# Patient Record
Sex: Female | Born: 1935 | Race: White | Hispanic: No | Smoking: Former smoker
Health system: Southern US, Community
[De-identification: ages and names within clinical notes are randomized; demographics above are authoritative.]

## PROBLEM LIST (undated history)

## (undated) DIAGNOSIS — I872 Venous insufficiency (chronic) (peripheral): Secondary | ICD-10-CM

## (undated) DIAGNOSIS — I10 Essential (primary) hypertension: Secondary | ICD-10-CM

## (undated) DIAGNOSIS — J209 Acute bronchitis, unspecified: Secondary | ICD-10-CM

## (undated) DIAGNOSIS — Z9861 Coronary angioplasty status: Secondary | ICD-10-CM

## (undated) DIAGNOSIS — M1990 Unspecified osteoarthritis, unspecified site: Secondary | ICD-10-CM

## (undated) DIAGNOSIS — I272 Pulmonary hypertension, unspecified: Secondary | ICD-10-CM

## (undated) DIAGNOSIS — E119 Type 2 diabetes mellitus without complications: Secondary | ICD-10-CM

## (undated) DIAGNOSIS — K8020 Calculus of gallbladder without cholecystitis without obstruction: Secondary | ICD-10-CM

## (undated) DIAGNOSIS — N289 Disorder of kidney and ureter, unspecified: Secondary | ICD-10-CM

## (undated) DIAGNOSIS — R55 Syncope and collapse: Secondary | ICD-10-CM

## (undated) DIAGNOSIS — I495 Sick sinus syndrome: Secondary | ICD-10-CM

## (undated) DIAGNOSIS — E669 Obesity, unspecified: Secondary | ICD-10-CM

## (undated) DIAGNOSIS — A0472 Enterocolitis due to Clostridium difficile, not specified as recurrent: Secondary | ICD-10-CM

## (undated) DIAGNOSIS — J449 Chronic obstructive pulmonary disease, unspecified: Secondary | ICD-10-CM

## (undated) DIAGNOSIS — I5030 Unspecified diastolic (congestive) heart failure: Secondary | ICD-10-CM

## (undated) DIAGNOSIS — E785 Hyperlipidemia, unspecified: Secondary | ICD-10-CM

## (undated) DIAGNOSIS — I2510 Atherosclerotic heart disease of native coronary artery without angina pectoris: Secondary | ICD-10-CM

## (undated) DIAGNOSIS — D649 Anemia, unspecified: Secondary | ICD-10-CM

## (undated) DIAGNOSIS — Z950 Presence of cardiac pacemaker: Secondary | ICD-10-CM

## (undated) DIAGNOSIS — I469 Cardiac arrest, cause unspecified: Secondary | ICD-10-CM

## (undated) DIAGNOSIS — Z8719 Personal history of other diseases of the digestive system: Secondary | ICD-10-CM

## (undated) HISTORY — DX: Obesity, unspecified: E66.9

## (undated) HISTORY — DX: Presence of cardiac pacemaker: Z95.0

## (undated) HISTORY — DX: Type 2 diabetes mellitus without complications: E11.9

## (undated) HISTORY — DX: Pulmonary hypertension, unspecified: I27.20

## (undated) HISTORY — DX: Unspecified osteoarthritis, unspecified site: M19.90

## (undated) HISTORY — DX: Chronic obstructive pulmonary disease, unspecified: J44.9

## (undated) HISTORY — DX: Cardiac arrest, cause unspecified: I46.9

## (undated) HISTORY — DX: Calculus of gallbladder without cholecystitis without obstruction: K80.20

## (undated) HISTORY — DX: Coronary angioplasty status: Z98.61

## (undated) HISTORY — DX: Hyperlipidemia, unspecified: E78.5

## (undated) HISTORY — DX: Personal history of other diseases of the digestive system: Z87.19

## (undated) HISTORY — DX: Venous insufficiency (chronic) (peripheral): I87.2

## (undated) HISTORY — DX: Sick sinus syndrome: I49.5

## (undated) HISTORY — DX: Essential (primary) hypertension: I10

## (undated) HISTORY — DX: Anemia, unspecified: D64.9

## (undated) HISTORY — DX: Syncope and collapse: R55

## (undated) HISTORY — DX: Unspecified diastolic (congestive) heart failure: I50.30

## (undated) HISTORY — DX: Disorder of kidney and ureter, unspecified: N28.9

## (undated) HISTORY — PX: VESICOVAGINAL FISTULA CLOSURE W/ TAH: SUR271

## (undated) HISTORY — PX: APPENDECTOMY: SHX54

## (undated) HISTORY — DX: Atherosclerotic heart disease of native coronary artery without angina pectoris: I25.10

## (undated) HISTORY — DX: Enterocolitis due to Clostridium difficile, not specified as recurrent: A04.72

## (undated) HISTORY — DX: Acute bronchitis, unspecified: J20.9

---

## 1998-08-15 ENCOUNTER — Encounter: Admission: RE | Admit: 1998-08-15 | Discharge: 1998-11-13 | Payer: Self-pay | Admitting: Family Medicine

## 1999-05-27 ENCOUNTER — Other Ambulatory Visit: Admission: RE | Admit: 1999-05-27 | Discharge: 1999-05-27 | Payer: Self-pay | Admitting: Family Medicine

## 1999-10-28 ENCOUNTER — Encounter: Payer: Self-pay | Admitting: Emergency Medicine

## 1999-10-29 ENCOUNTER — Inpatient Hospital Stay (HOSPITAL_COMMUNITY): Admission: EM | Admit: 1999-10-29 | Discharge: 1999-11-03 | Payer: Self-pay | Admitting: Emergency Medicine

## 1999-10-30 ENCOUNTER — Encounter: Payer: Self-pay | Admitting: Emergency Medicine

## 1999-11-02 ENCOUNTER — Encounter: Payer: Self-pay | Admitting: Emergency Medicine

## 2000-09-13 DIAGNOSIS — Z950 Presence of cardiac pacemaker: Secondary | ICD-10-CM

## 2000-09-13 DIAGNOSIS — I469 Cardiac arrest, cause unspecified: Secondary | ICD-10-CM

## 2000-09-13 HISTORY — DX: Presence of cardiac pacemaker: Z95.0

## 2000-09-13 HISTORY — PX: PACEMAKER PLACEMENT: SHX43

## 2000-09-13 HISTORY — PX: PTCA: SHX146

## 2000-09-13 HISTORY — DX: Cardiac arrest, cause unspecified: I46.9

## 2000-11-30 ENCOUNTER — Other Ambulatory Visit: Admission: RE | Admit: 2000-11-30 | Discharge: 2000-11-30 | Payer: Self-pay | Admitting: Family Medicine

## 2001-06-28 ENCOUNTER — Ambulatory Visit (HOSPITAL_COMMUNITY): Admission: RE | Admit: 2001-06-28 | Discharge: 2001-06-28 | Payer: Self-pay | Admitting: Family Medicine

## 2001-07-26 ENCOUNTER — Encounter: Payer: Self-pay | Admitting: Family Medicine

## 2001-07-26 ENCOUNTER — Ambulatory Visit (HOSPITAL_COMMUNITY): Admission: RE | Admit: 2001-07-26 | Discharge: 2001-07-26 | Payer: Self-pay | Admitting: Family Medicine

## 2001-08-17 ENCOUNTER — Inpatient Hospital Stay (HOSPITAL_COMMUNITY): Admission: AD | Admit: 2001-08-17 | Discharge: 2001-08-19 | Payer: Self-pay | Admitting: Family Medicine

## 2001-08-17 ENCOUNTER — Encounter: Payer: Self-pay | Admitting: Family Medicine

## 2001-08-17 ENCOUNTER — Emergency Department (HOSPITAL_COMMUNITY): Admission: EM | Admit: 2001-08-17 | Discharge: 2001-08-17 | Payer: Self-pay | Admitting: Emergency Medicine

## 2001-08-19 ENCOUNTER — Encounter: Payer: Self-pay | Admitting: Family Medicine

## 2001-08-31 ENCOUNTER — Encounter: Payer: Self-pay | Admitting: Emergency Medicine

## 2001-08-31 ENCOUNTER — Inpatient Hospital Stay (HOSPITAL_COMMUNITY): Admission: EM | Admit: 2001-08-31 | Discharge: 2001-09-07 | Payer: Self-pay | Admitting: Emergency Medicine

## 2001-09-03 ENCOUNTER — Encounter: Payer: Self-pay | Admitting: Cardiology

## 2001-09-10 ENCOUNTER — Encounter: Payer: Self-pay | Admitting: Emergency Medicine

## 2001-09-11 ENCOUNTER — Inpatient Hospital Stay (HOSPITAL_COMMUNITY): Admission: EM | Admit: 2001-09-11 | Discharge: 2001-09-13 | Payer: Self-pay | Admitting: Emergency Medicine

## 2001-09-13 ENCOUNTER — Encounter: Payer: Self-pay | Admitting: Cardiology

## 2001-11-27 ENCOUNTER — Ambulatory Visit (HOSPITAL_COMMUNITY): Admission: RE | Admit: 2001-11-27 | Discharge: 2001-11-27 | Payer: Self-pay | Admitting: Internal Medicine

## 2001-11-27 ENCOUNTER — Encounter: Payer: Self-pay | Admitting: Internal Medicine

## 2001-12-27 ENCOUNTER — Emergency Department (HOSPITAL_COMMUNITY): Admission: EM | Admit: 2001-12-27 | Discharge: 2001-12-27 | Payer: Self-pay

## 2004-06-29 ENCOUNTER — Ambulatory Visit: Payer: Self-pay | Admitting: Internal Medicine

## 2004-07-06 ENCOUNTER — Ambulatory Visit: Payer: Self-pay | Admitting: Internal Medicine

## 2004-07-27 ENCOUNTER — Ambulatory Visit: Payer: Self-pay | Admitting: Internal Medicine

## 2004-08-11 ENCOUNTER — Ambulatory Visit: Payer: Self-pay | Admitting: Internal Medicine

## 2004-08-18 ENCOUNTER — Ambulatory Visit: Payer: Self-pay | Admitting: Critical Care Medicine

## 2004-10-03 ENCOUNTER — Ambulatory Visit: Payer: Self-pay | Admitting: Internal Medicine

## 2004-10-08 ENCOUNTER — Ambulatory Visit: Payer: Self-pay | Admitting: Endocrinology

## 2004-10-13 ENCOUNTER — Emergency Department (HOSPITAL_COMMUNITY): Admission: EM | Admit: 2004-10-13 | Discharge: 2004-10-13 | Payer: Self-pay | Admitting: Family Medicine

## 2004-10-15 ENCOUNTER — Ambulatory Visit: Payer: Self-pay | Admitting: Endocrinology

## 2004-10-21 ENCOUNTER — Ambulatory Visit: Payer: Self-pay | Admitting: Internal Medicine

## 2004-11-12 ENCOUNTER — Ambulatory Visit: Payer: Self-pay | Admitting: Endocrinology

## 2004-12-09 ENCOUNTER — Ambulatory Visit: Payer: Self-pay | Admitting: Internal Medicine

## 2004-12-15 ENCOUNTER — Ambulatory Visit: Payer: Self-pay | Admitting: Cardiology

## 2004-12-15 ENCOUNTER — Inpatient Hospital Stay (HOSPITAL_BASED_OUTPATIENT_CLINIC_OR_DEPARTMENT_OTHER): Admission: RE | Admit: 2004-12-15 | Discharge: 2004-12-15 | Payer: Self-pay | Admitting: *Deleted

## 2004-12-30 ENCOUNTER — Ambulatory Visit: Payer: Self-pay | Admitting: Endocrinology

## 2005-01-06 ENCOUNTER — Ambulatory Visit: Payer: Self-pay | Admitting: Endocrinology

## 2005-01-08 ENCOUNTER — Ambulatory Visit: Payer: Self-pay | Admitting: Cardiology

## 2005-01-20 ENCOUNTER — Emergency Department (HOSPITAL_COMMUNITY): Admission: EM | Admit: 2005-01-20 | Discharge: 2005-01-20 | Payer: Self-pay | Admitting: Emergency Medicine

## 2005-01-27 ENCOUNTER — Ambulatory Visit: Payer: Self-pay | Admitting: Critical Care Medicine

## 2005-01-27 ENCOUNTER — Ambulatory Visit: Payer: Self-pay | Admitting: Internal Medicine

## 2005-02-17 ENCOUNTER — Ambulatory Visit: Payer: Self-pay | Admitting: Internal Medicine

## 2005-02-23 ENCOUNTER — Ambulatory Visit: Payer: Self-pay | Admitting: Endocrinology

## 2005-02-23 ENCOUNTER — Ambulatory Visit (HOSPITAL_COMMUNITY): Admission: RE | Admit: 2005-02-23 | Discharge: 2005-02-23 | Payer: Self-pay | Admitting: Endocrinology

## 2005-03-10 ENCOUNTER — Ambulatory Visit: Payer: Self-pay | Admitting: Endocrinology

## 2005-03-26 ENCOUNTER — Ambulatory Visit: Payer: Self-pay | Admitting: Internal Medicine

## 2005-03-29 ENCOUNTER — Ambulatory Visit: Payer: Self-pay | Admitting: Endocrinology

## 2005-03-30 ENCOUNTER — Ambulatory Visit: Payer: Self-pay | Admitting: Critical Care Medicine

## 2005-03-31 ENCOUNTER — Ambulatory Visit: Payer: Self-pay | Admitting: Endocrinology

## 2005-04-06 ENCOUNTER — Ambulatory Visit: Payer: Self-pay | Admitting: Endocrinology

## 2005-04-14 ENCOUNTER — Ambulatory Visit: Payer: Self-pay | Admitting: Endocrinology

## 2005-04-20 ENCOUNTER — Ambulatory Visit: Payer: Self-pay | Admitting: Internal Medicine

## 2005-04-22 ENCOUNTER — Ambulatory Visit: Payer: Self-pay | Admitting: Internal Medicine

## 2005-05-06 ENCOUNTER — Ambulatory Visit: Payer: Self-pay | Admitting: Endocrinology

## 2005-05-12 ENCOUNTER — Ambulatory Visit: Payer: Self-pay | Admitting: Internal Medicine

## 2005-05-28 ENCOUNTER — Ambulatory Visit: Payer: Self-pay | Admitting: Internal Medicine

## 2005-05-31 ENCOUNTER — Ambulatory Visit: Payer: Self-pay | Admitting: Endocrinology

## 2005-06-03 ENCOUNTER — Ambulatory Visit: Payer: Self-pay | Admitting: Endocrinology

## 2005-06-16 ENCOUNTER — Ambulatory Visit: Payer: Self-pay | Admitting: Endocrinology

## 2005-06-21 ENCOUNTER — Ambulatory Visit: Payer: Self-pay | Admitting: Internal Medicine

## 2005-06-23 ENCOUNTER — Ambulatory Visit: Payer: Self-pay | Admitting: Internal Medicine

## 2005-06-25 ENCOUNTER — Ambulatory Visit: Payer: Self-pay | Admitting: Critical Care Medicine

## 2005-06-28 ENCOUNTER — Ambulatory Visit: Payer: Self-pay | Admitting: Internal Medicine

## 2005-06-29 ENCOUNTER — Inpatient Hospital Stay (HOSPITAL_COMMUNITY): Admission: EM | Admit: 2005-06-29 | Discharge: 2005-07-06 | Payer: Self-pay | Admitting: Emergency Medicine

## 2005-06-29 ENCOUNTER — Ambulatory Visit: Payer: Self-pay | Admitting: Internal Medicine

## 2005-06-29 ENCOUNTER — Ambulatory Visit: Payer: Self-pay | Admitting: Physical Medicine & Rehabilitation

## 2005-07-12 ENCOUNTER — Ambulatory Visit: Payer: Self-pay | Admitting: Endocrinology

## 2005-07-16 ENCOUNTER — Encounter: Admission: RE | Admit: 2005-07-16 | Discharge: 2005-07-16 | Payer: Self-pay | Admitting: Endocrinology

## 2005-07-20 ENCOUNTER — Ambulatory Visit: Payer: Self-pay | Admitting: Internal Medicine

## 2005-07-21 ENCOUNTER — Ambulatory Visit: Payer: Self-pay | Admitting: Cardiology

## 2005-07-30 ENCOUNTER — Ambulatory Visit: Payer: Self-pay | Admitting: Endocrinology

## 2005-08-04 ENCOUNTER — Emergency Department (HOSPITAL_COMMUNITY): Admission: EM | Admit: 2005-08-04 | Discharge: 2005-08-04 | Payer: Self-pay | Admitting: Emergency Medicine

## 2005-08-09 ENCOUNTER — Ambulatory Visit: Payer: Self-pay | Admitting: Family Medicine

## 2005-08-12 ENCOUNTER — Emergency Department (HOSPITAL_COMMUNITY): Admission: EM | Admit: 2005-08-12 | Discharge: 2005-08-12 | Payer: Self-pay | Admitting: Emergency Medicine

## 2005-08-14 ENCOUNTER — Emergency Department (HOSPITAL_COMMUNITY): Admission: EM | Admit: 2005-08-14 | Discharge: 2005-08-14 | Payer: Self-pay | Admitting: Emergency Medicine

## 2005-08-18 ENCOUNTER — Emergency Department (HOSPITAL_COMMUNITY): Admission: EM | Admit: 2005-08-18 | Discharge: 2005-08-18 | Payer: Self-pay | Admitting: Emergency Medicine

## 2005-08-20 ENCOUNTER — Ambulatory Visit: Payer: Self-pay | Admitting: Endocrinology

## 2005-09-15 ENCOUNTER — Ambulatory Visit: Payer: Self-pay | Admitting: Cardiology

## 2005-09-22 ENCOUNTER — Ambulatory Visit: Payer: Self-pay | Admitting: Endocrinology

## 2005-10-13 ENCOUNTER — Ambulatory Visit: Payer: Self-pay | Admitting: Cardiology

## 2005-11-01 ENCOUNTER — Ambulatory Visit: Payer: Self-pay | Admitting: Internal Medicine

## 2005-11-12 ENCOUNTER — Ambulatory Visit: Payer: Self-pay | Admitting: Endocrinology

## 2005-11-16 ENCOUNTER — Ambulatory Visit: Payer: Self-pay | Admitting: Cardiology

## 2005-12-13 ENCOUNTER — Ambulatory Visit: Payer: Self-pay | Admitting: Internal Medicine

## 2006-01-05 ENCOUNTER — Ambulatory Visit: Payer: Self-pay | Admitting: Internal Medicine

## 2006-01-18 ENCOUNTER — Ambulatory Visit: Payer: Self-pay | Admitting: Endocrinology

## 2006-01-20 ENCOUNTER — Ambulatory Visit: Payer: Self-pay | Admitting: Cardiology

## 2006-01-20 ENCOUNTER — Ambulatory Visit: Payer: Self-pay | Admitting: Internal Medicine

## 2006-01-31 ENCOUNTER — Ambulatory Visit: Payer: Self-pay | Admitting: Internal Medicine

## 2006-02-28 ENCOUNTER — Inpatient Hospital Stay (HOSPITAL_COMMUNITY): Admission: EM | Admit: 2006-02-28 | Discharge: 2006-03-04 | Payer: Self-pay | Admitting: Emergency Medicine

## 2006-02-28 ENCOUNTER — Ambulatory Visit: Payer: Self-pay | Admitting: Endocrinology

## 2006-03-07 ENCOUNTER — Ambulatory Visit: Payer: Self-pay | Admitting: Endocrinology

## 2006-03-21 ENCOUNTER — Ambulatory Visit: Payer: Self-pay | Admitting: Cardiology

## 2006-04-04 ENCOUNTER — Ambulatory Visit: Payer: Self-pay | Admitting: Internal Medicine

## 2006-04-06 ENCOUNTER — Ambulatory Visit: Payer: Self-pay | Admitting: Critical Care Medicine

## 2006-04-14 ENCOUNTER — Ambulatory Visit: Payer: Self-pay | Admitting: Internal Medicine

## 2006-04-14 ENCOUNTER — Inpatient Hospital Stay (HOSPITAL_COMMUNITY): Admission: EM | Admit: 2006-04-14 | Discharge: 2006-04-25 | Payer: Self-pay | Admitting: Emergency Medicine

## 2006-04-26 ENCOUNTER — Ambulatory Visit: Payer: Self-pay | Admitting: Endocrinology

## 2006-04-27 ENCOUNTER — Ambulatory Visit: Payer: Self-pay | Admitting: Endocrinology

## 2006-05-04 ENCOUNTER — Ambulatory Visit: Payer: Self-pay | Admitting: Endocrinology

## 2006-05-10 ENCOUNTER — Ambulatory Visit: Payer: Self-pay | Admitting: Critical Care Medicine

## 2006-05-17 ENCOUNTER — Ambulatory Visit: Payer: Self-pay | Admitting: Internal Medicine

## 2006-05-18 ENCOUNTER — Ambulatory Visit: Payer: Self-pay | Admitting: Endocrinology

## 2006-05-25 ENCOUNTER — Ambulatory Visit: Payer: Self-pay | Admitting: Internal Medicine

## 2006-06-13 ENCOUNTER — Ambulatory Visit: Payer: Self-pay | Admitting: Critical Care Medicine

## 2006-06-21 ENCOUNTER — Ambulatory Visit: Payer: Self-pay | Admitting: Internal Medicine

## 2006-06-24 ENCOUNTER — Ambulatory Visit: Payer: Self-pay | Admitting: Endocrinology

## 2006-07-07 ENCOUNTER — Ambulatory Visit: Payer: Self-pay | Admitting: Endocrinology

## 2006-07-11 ENCOUNTER — Inpatient Hospital Stay (HOSPITAL_COMMUNITY): Admission: EM | Admit: 2006-07-11 | Discharge: 2006-07-15 | Payer: Self-pay | Admitting: Emergency Medicine

## 2006-07-11 ENCOUNTER — Ambulatory Visit: Payer: Self-pay | Admitting: Internal Medicine

## 2006-07-18 ENCOUNTER — Ambulatory Visit: Payer: Self-pay | Admitting: Critical Care Medicine

## 2006-07-21 ENCOUNTER — Ambulatory Visit: Payer: Self-pay | Admitting: Endocrinology

## 2006-08-08 ENCOUNTER — Ambulatory Visit: Payer: Self-pay | Admitting: Endocrinology

## 2006-08-16 ENCOUNTER — Ambulatory Visit: Payer: Self-pay | Admitting: Endocrinology

## 2006-08-18 ENCOUNTER — Ambulatory Visit: Payer: Self-pay | Admitting: Endocrinology

## 2006-08-22 ENCOUNTER — Ambulatory Visit: Payer: Self-pay | Admitting: Cardiology

## 2006-08-24 ENCOUNTER — Ambulatory Visit: Payer: Self-pay | Admitting: Critical Care Medicine

## 2006-09-02 ENCOUNTER — Ambulatory Visit: Payer: Self-pay | Admitting: Internal Medicine

## 2006-09-05 ENCOUNTER — Ambulatory Visit: Payer: Self-pay | Admitting: Pulmonary Disease

## 2006-09-05 ENCOUNTER — Inpatient Hospital Stay (HOSPITAL_COMMUNITY): Admission: EM | Admit: 2006-09-05 | Discharge: 2006-09-09 | Payer: Self-pay | Admitting: Emergency Medicine

## 2006-09-07 ENCOUNTER — Ambulatory Visit: Payer: Self-pay | Admitting: Endocrinology

## 2006-09-19 ENCOUNTER — Ambulatory Visit: Payer: Self-pay | Admitting: Endocrinology

## 2006-09-21 ENCOUNTER — Ambulatory Visit: Payer: Self-pay | Admitting: Critical Care Medicine

## 2006-10-11 ENCOUNTER — Emergency Department (HOSPITAL_COMMUNITY): Admission: EM | Admit: 2006-10-11 | Discharge: 2006-10-11 | Payer: Self-pay | Admitting: Emergency Medicine

## 2006-10-11 ENCOUNTER — Ambulatory Visit: Payer: Self-pay | Admitting: Cardiology

## 2006-10-17 ENCOUNTER — Ambulatory Visit: Payer: Self-pay | Admitting: Internal Medicine

## 2006-10-17 ENCOUNTER — Ambulatory Visit: Payer: Self-pay | Admitting: Endocrinology

## 2006-10-18 ENCOUNTER — Inpatient Hospital Stay (HOSPITAL_COMMUNITY): Admission: EM | Admit: 2006-10-18 | Discharge: 2006-10-24 | Payer: Self-pay | Admitting: Emergency Medicine

## 2006-10-18 ENCOUNTER — Ambulatory Visit: Payer: Self-pay | Admitting: Endocrinology

## 2006-10-19 ENCOUNTER — Ambulatory Visit: Payer: Self-pay | Admitting: Internal Medicine

## 2006-11-08 ENCOUNTER — Ambulatory Visit: Payer: Self-pay | Admitting: Endocrinology

## 2006-11-14 ENCOUNTER — Ambulatory Visit: Payer: Self-pay | Admitting: Critical Care Medicine

## 2006-11-21 ENCOUNTER — Ambulatory Visit: Payer: Self-pay | Admitting: Cardiology

## 2006-11-21 LAB — CONVERTED CEMR LAB
Chloride: 104 meq/L (ref 96–112)
Eosinophils Absolute: 0 10*3/uL (ref 0.0–0.6)
Eosinophils Relative: 0.2 % (ref 0.0–5.0)
GFR calc non Af Amer: 27 mL/min
Glucose, Bld: 53 mg/dL — ABNORMAL LOW (ref 70–99)
HCT: 32.7 % — ABNORMAL LOW (ref 36.0–46.0)
Hemoglobin: 11.7 g/dL — ABNORMAL LOW (ref 12.0–15.0)
Lymphocytes Relative: 16.4 % (ref 12.0–46.0)
MCV: 81.8 fL (ref 78.0–100.0)
Monocytes Absolute: 0.1 10*3/uL — ABNORMAL LOW (ref 0.2–0.7)
Neutrophils Relative %: 82.7 % — ABNORMAL HIGH (ref 43.0–77.0)
Potassium: 3.9 meq/L (ref 3.5–5.1)
RBC: 4.36 M/uL (ref 3.87–5.11)
Sodium: 145 meq/L (ref 135–145)
WBC: 16.7 10*3/uL — ABNORMAL HIGH (ref 4.5–10.5)

## 2006-11-22 ENCOUNTER — Ambulatory Visit: Payer: Self-pay | Admitting: Endocrinology

## 2006-11-29 ENCOUNTER — Ambulatory Visit: Payer: Self-pay | Admitting: Critical Care Medicine

## 2006-12-06 ENCOUNTER — Ambulatory Visit: Payer: Self-pay | Admitting: Internal Medicine

## 2006-12-06 ENCOUNTER — Inpatient Hospital Stay (HOSPITAL_COMMUNITY): Admission: EM | Admit: 2006-12-06 | Discharge: 2006-12-08 | Payer: Self-pay | Admitting: Endocrinology

## 2006-12-06 ENCOUNTER — Encounter: Payer: Self-pay | Admitting: Vascular Surgery

## 2006-12-06 ENCOUNTER — Ambulatory Visit: Payer: Self-pay | Admitting: Vascular Surgery

## 2006-12-06 ENCOUNTER — Ambulatory Visit: Payer: Self-pay | Admitting: Endocrinology

## 2006-12-13 ENCOUNTER — Ambulatory Visit: Payer: Self-pay | Admitting: *Deleted

## 2006-12-13 LAB — CONVERTED CEMR LAB
CO2: 31 meq/L (ref 19–32)
Calcium: 9.2 mg/dL (ref 8.4–10.5)
Chloride: 101 meq/L (ref 96–112)
GFR calc Af Amer: 38 mL/min
Glucose, Bld: 42 mg/dL — CL (ref 70–99)

## 2006-12-14 ENCOUNTER — Ambulatory Visit: Payer: Self-pay | Admitting: Endocrinology

## 2006-12-26 ENCOUNTER — Ambulatory Visit: Payer: Self-pay | Admitting: Endocrinology

## 2006-12-27 ENCOUNTER — Ambulatory Visit: Payer: Self-pay | Admitting: Endocrinology

## 2007-01-02 ENCOUNTER — Ambulatory Visit: Payer: Self-pay | Admitting: Critical Care Medicine

## 2007-01-09 ENCOUNTER — Ambulatory Visit: Payer: Self-pay | Admitting: Cardiology

## 2007-01-09 LAB — CONVERTED CEMR LAB
Basophils Absolute: 0.1 10*3/uL (ref 0.0–0.1)
Eosinophils Absolute: 0.1 10*3/uL (ref 0.0–0.6)
GFR calc non Af Amer: 31 mL/min
HCT: 32.5 % — ABNORMAL LOW (ref 36.0–46.0)
Hemoglobin: 10.7 g/dL — ABNORMAL LOW (ref 12.0–15.0)
MCHC: 33 g/dL (ref 30.0–36.0)
MCV: 78.4 fL (ref 78.0–100.0)
Monocytes Absolute: 1.2 10*3/uL — ABNORMAL HIGH (ref 0.2–0.7)
Neutrophils Relative %: 75.2 % (ref 43.0–77.0)
Potassium: 3.5 meq/L (ref 3.5–5.1)
Pro B Natriuretic peptide (BNP): 81 pg/mL (ref 0.0–100.0)
Sodium: 144 meq/L (ref 135–145)

## 2007-01-10 ENCOUNTER — Ambulatory Visit: Payer: Self-pay | Admitting: Pulmonary Disease

## 2007-01-10 ENCOUNTER — Ambulatory Visit: Payer: Self-pay | Admitting: Critical Care Medicine

## 2007-01-30 ENCOUNTER — Encounter: Payer: Self-pay | Admitting: Endocrinology

## 2007-01-30 DIAGNOSIS — J449 Chronic obstructive pulmonary disease, unspecified: Secondary | ICD-10-CM | POA: Insufficient documentation

## 2007-01-30 DIAGNOSIS — I8310 Varicose veins of unspecified lower extremity with inflammation: Secondary | ICD-10-CM | POA: Insufficient documentation

## 2007-01-30 DIAGNOSIS — E785 Hyperlipidemia, unspecified: Secondary | ICD-10-CM

## 2007-01-30 DIAGNOSIS — E1129 Type 2 diabetes mellitus with other diabetic kidney complication: Secondary | ICD-10-CM

## 2007-01-30 DIAGNOSIS — M109 Gout, unspecified: Secondary | ICD-10-CM

## 2007-01-30 DIAGNOSIS — J309 Allergic rhinitis, unspecified: Secondary | ICD-10-CM | POA: Insufficient documentation

## 2007-02-07 ENCOUNTER — Ambulatory Visit: Payer: Self-pay | Admitting: Internal Medicine

## 2007-02-07 LAB — CONVERTED CEMR LAB
Albumin: 2.8 g/dL — ABNORMAL LOW (ref 3.5–5.2)
GFR calc non Af Amer: 23 mL/min
Potassium: 4.3 meq/L (ref 3.5–5.1)
Sodium: 143 meq/L (ref 135–145)
Total Bilirubin: 0.6 mg/dL (ref 0.3–1.2)

## 2007-02-13 LAB — CONVERTED CEMR LAB: Pap Smear: NORMAL

## 2007-02-17 ENCOUNTER — Ambulatory Visit: Payer: Self-pay | Admitting: Pulmonary Disease

## 2007-02-17 ENCOUNTER — Inpatient Hospital Stay (HOSPITAL_COMMUNITY): Admission: EM | Admit: 2007-02-17 | Discharge: 2007-03-08 | Payer: Self-pay | Admitting: Emergency Medicine

## 2007-02-17 ENCOUNTER — Ambulatory Visit: Payer: Self-pay | Admitting: Cardiovascular Disease

## 2007-02-17 ENCOUNTER — Ambulatory Visit: Payer: Self-pay | Admitting: Internal Medicine

## 2007-02-21 ENCOUNTER — Encounter (INDEPENDENT_AMBULATORY_CARE_PROVIDER_SITE_OTHER): Payer: Self-pay | Admitting: Internal Medicine

## 2007-02-24 DIAGNOSIS — Z8719 Personal history of other diseases of the digestive system: Secondary | ICD-10-CM

## 2007-03-08 ENCOUNTER — Ambulatory Visit: Payer: Self-pay | Admitting: Endocrinology

## 2007-04-05 ENCOUNTER — Ambulatory Visit: Payer: Self-pay | Admitting: Critical Care Medicine

## 2007-04-05 LAB — CONVERTED CEMR LAB
Crystals: NEGATIVE
Ketones, ur: NEGATIVE mg/dL
Specific Gravity, Urine: >=1.03 (ref 1.000–1.03)
Urine Glucose: NEGATIVE mg/dL
Urobilinogen, UA: 0.2 (ref 0.0–1.0)
pH: 5.5 (ref 5.0–8.0)

## 2007-04-06 ENCOUNTER — Encounter: Payer: Self-pay | Admitting: Critical Care Medicine

## 2007-04-19 ENCOUNTER — Ambulatory Visit: Payer: Self-pay | Admitting: Internal Medicine

## 2007-04-24 ENCOUNTER — Ambulatory Visit (HOSPITAL_COMMUNITY): Admission: RE | Admit: 2007-04-24 | Discharge: 2007-04-24 | Payer: Self-pay | Admitting: Internal Medicine

## 2007-04-28 ENCOUNTER — Ambulatory Visit: Payer: Self-pay | Admitting: Cardiology

## 2007-05-05 ENCOUNTER — Ambulatory Visit: Payer: Self-pay | Admitting: Critical Care Medicine

## 2007-05-24 ENCOUNTER — Ambulatory Visit: Payer: Self-pay | Admitting: Internal Medicine

## 2007-06-23 ENCOUNTER — Ambulatory Visit: Payer: Self-pay | Admitting: Cardiology

## 2007-06-27 ENCOUNTER — Ambulatory Visit: Payer: Self-pay | Admitting: Endocrinology

## 2007-06-27 LAB — CONVERTED CEMR LAB: Hgb A1c MFr Bld: 5.8 % (ref 4.6–6.0)

## 2007-07-05 ENCOUNTER — Encounter: Payer: Self-pay | Admitting: Endocrinology

## 2007-07-12 ENCOUNTER — Telehealth (INDEPENDENT_AMBULATORY_CARE_PROVIDER_SITE_OTHER): Payer: Self-pay | Admitting: *Deleted

## 2007-07-13 ENCOUNTER — Ambulatory Visit: Payer: Self-pay | Admitting: Endocrinology

## 2007-08-08 ENCOUNTER — Inpatient Hospital Stay (HOSPITAL_COMMUNITY): Admission: EM | Admit: 2007-08-08 | Discharge: 2007-08-09 | Payer: Self-pay | Admitting: Emergency Medicine

## 2007-08-09 ENCOUNTER — Encounter: Payer: Self-pay | Admitting: Endocrinology

## 2007-08-23 ENCOUNTER — Encounter: Payer: Self-pay | Admitting: *Deleted

## 2007-08-23 DIAGNOSIS — D631 Anemia in chronic kidney disease: Secondary | ICD-10-CM | POA: Insufficient documentation

## 2007-08-23 DIAGNOSIS — I10 Essential (primary) hypertension: Secondary | ICD-10-CM | POA: Insufficient documentation

## 2007-08-23 DIAGNOSIS — E669 Obesity, unspecified: Secondary | ICD-10-CM

## 2007-08-23 DIAGNOSIS — N189 Chronic kidney disease, unspecified: Secondary | ICD-10-CM

## 2007-08-23 DIAGNOSIS — M1990 Unspecified osteoarthritis, unspecified site: Secondary | ICD-10-CM | POA: Insufficient documentation

## 2007-09-25 ENCOUNTER — Ambulatory Visit: Payer: Self-pay | Admitting: Endocrinology

## 2007-10-10 ENCOUNTER — Encounter: Payer: Self-pay | Admitting: Pulmonary Disease

## 2007-10-10 ENCOUNTER — Ambulatory Visit: Payer: Self-pay | Admitting: Cardiology

## 2007-10-12 ENCOUNTER — Ambulatory Visit: Payer: Self-pay | Admitting: Internal Medicine

## 2007-10-12 DIAGNOSIS — I2510 Atherosclerotic heart disease of native coronary artery without angina pectoris: Secondary | ICD-10-CM

## 2007-10-12 DIAGNOSIS — N184 Chronic kidney disease, stage 4 (severe): Secondary | ICD-10-CM

## 2007-10-12 DIAGNOSIS — J441 Chronic obstructive pulmonary disease with (acute) exacerbation: Secondary | ICD-10-CM

## 2008-01-01 ENCOUNTER — Ambulatory Visit: Payer: Self-pay | Admitting: Endocrinology

## 2008-01-01 ENCOUNTER — Encounter (INDEPENDENT_AMBULATORY_CARE_PROVIDER_SITE_OTHER): Payer: Self-pay | Admitting: *Deleted

## 2008-01-04 ENCOUNTER — Ambulatory Visit: Payer: Self-pay | Admitting: Cardiology

## 2008-01-28 ENCOUNTER — Emergency Department (HOSPITAL_COMMUNITY): Admission: EM | Admit: 2008-01-28 | Discharge: 2008-01-28 | Payer: Self-pay | Admitting: Emergency Medicine

## 2008-01-31 ENCOUNTER — Ambulatory Visit: Payer: Self-pay | Admitting: Critical Care Medicine

## 2008-01-31 LAB — CONVERTED CEMR LAB
Basophils Absolute: 0.1 10*3/uL (ref 0.0–0.1)
Crystals: NEGATIVE
Eosinophils Absolute: 0.5 10*3/uL (ref 0.0–0.7)
Leukocytes, UA: NEGATIVE
MCHC: 33.4 g/dL (ref 30.0–36.0)
MCV: 81 fL (ref 78.0–100.0)
Monocytes Absolute: 1 10*3/uL (ref 0.1–1.0)
Neutrophils Relative %: 64.2 % (ref 43.0–77.0)
Platelets: 253 10*3/uL (ref 150–400)
RDW: 14.1 % (ref 11.5–14.6)
Specific Gravity, Urine: 1.01 (ref 1.000–1.03)
Urobilinogen, UA: 0.2 (ref 0.0–1.0)
WBC: 11.6 10*3/uL — ABNORMAL HIGH (ref 4.5–10.5)

## 2008-02-01 ENCOUNTER — Ambulatory Visit: Payer: Self-pay | Admitting: Critical Care Medicine

## 2008-02-01 ENCOUNTER — Inpatient Hospital Stay (HOSPITAL_COMMUNITY): Admission: EM | Admit: 2008-02-01 | Discharge: 2008-02-04 | Payer: Self-pay | Admitting: Emergency Medicine

## 2008-02-01 ENCOUNTER — Ambulatory Visit: Payer: Self-pay | Admitting: Internal Medicine

## 2008-02-01 ENCOUNTER — Encounter (INDEPENDENT_AMBULATORY_CARE_PROVIDER_SITE_OTHER): Payer: Self-pay | Admitting: Internal Medicine

## 2008-03-01 ENCOUNTER — Ambulatory Visit: Payer: Self-pay | Admitting: Critical Care Medicine

## 2008-04-02 ENCOUNTER — Ambulatory Visit: Payer: Self-pay | Admitting: Endocrinology

## 2008-04-02 LAB — CONVERTED CEMR LAB: Hgb A1c MFr Bld: 6.5 % — ABNORMAL HIGH (ref 4.6–6.0)

## 2008-05-31 ENCOUNTER — Ambulatory Visit: Payer: Self-pay | Admitting: Critical Care Medicine

## 2008-06-11 ENCOUNTER — Ambulatory Visit: Payer: Self-pay | Admitting: Internal Medicine

## 2008-07-01 ENCOUNTER — Telehealth (INDEPENDENT_AMBULATORY_CARE_PROVIDER_SITE_OTHER): Payer: Self-pay | Admitting: *Deleted

## 2008-07-02 ENCOUNTER — Ambulatory Visit: Payer: Self-pay | Admitting: Critical Care Medicine

## 2008-07-22 ENCOUNTER — Ambulatory Visit: Payer: Self-pay | Admitting: Critical Care Medicine

## 2008-11-20 ENCOUNTER — Ambulatory Visit: Payer: Self-pay | Admitting: Critical Care Medicine

## 2008-12-20 ENCOUNTER — Encounter (INDEPENDENT_AMBULATORY_CARE_PROVIDER_SITE_OTHER): Payer: Self-pay

## 2008-12-31 ENCOUNTER — Encounter: Payer: Self-pay | Admitting: Cardiology

## 2008-12-31 ENCOUNTER — Encounter: Payer: Self-pay | Admitting: Internal Medicine

## 2008-12-31 ENCOUNTER — Ambulatory Visit: Payer: Self-pay | Admitting: Internal Medicine

## 2008-12-31 DIAGNOSIS — R55 Syncope and collapse: Secondary | ICD-10-CM

## 2008-12-31 DIAGNOSIS — I495 Sick sinus syndrome: Secondary | ICD-10-CM | POA: Insufficient documentation

## 2008-12-31 DIAGNOSIS — I5033 Acute on chronic diastolic (congestive) heart failure: Secondary | ICD-10-CM

## 2009-01-01 ENCOUNTER — Telehealth (INDEPENDENT_AMBULATORY_CARE_PROVIDER_SITE_OTHER): Payer: Self-pay | Admitting: *Deleted

## 2009-01-01 ENCOUNTER — Ambulatory Visit: Payer: Self-pay | Admitting: Critical Care Medicine

## 2009-01-16 ENCOUNTER — Encounter: Payer: Self-pay | Admitting: Cardiology

## 2009-02-17 ENCOUNTER — Ambulatory Visit: Payer: Self-pay | Admitting: Cardiology

## 2009-02-24 ENCOUNTER — Telehealth: Payer: Self-pay | Admitting: Cardiology

## 2009-03-10 ENCOUNTER — Ambulatory Visit: Payer: Self-pay | Admitting: Critical Care Medicine

## 2009-03-10 DIAGNOSIS — J209 Acute bronchitis, unspecified: Secondary | ICD-10-CM | POA: Insufficient documentation

## 2009-03-18 ENCOUNTER — Ambulatory Visit: Payer: Self-pay | Admitting: Endocrinology

## 2009-03-28 ENCOUNTER — Telehealth (INDEPENDENT_AMBULATORY_CARE_PROVIDER_SITE_OTHER): Payer: Self-pay | Admitting: *Deleted

## 2009-04-01 ENCOUNTER — Ambulatory Visit: Payer: Self-pay | Admitting: Internal Medicine

## 2009-04-01 ENCOUNTER — Telehealth (INDEPENDENT_AMBULATORY_CARE_PROVIDER_SITE_OTHER): Payer: Self-pay | Admitting: *Deleted

## 2009-04-01 LAB — CONVERTED CEMR LAB
BUN: 27 mg/dL — ABNORMAL HIGH (ref 6–23)
Basophils Absolute: 0.1 10*3/uL (ref 0.0–0.1)
Basophils Relative: 0.5 % (ref 0.0–3.0)
CO2: 33 meq/L — ABNORMAL HIGH (ref 19–32)
Calcium: 9.2 mg/dL (ref 8.4–10.5)
Chloride: 104 meq/L (ref 96–112)
Creatinine, Ser: 1.7 mg/dL — ABNORMAL HIGH (ref 0.4–1.2)
Eosinophils Absolute: 0.4 10*3/uL (ref 0.0–0.7)
Lymphocytes Relative: 19.1 % (ref 12.0–46.0)
MCHC: 33.8 g/dL (ref 30.0–36.0)
MCV: 81.6 fL (ref 78.0–100.0)
Magnesium: 2.2 mg/dL (ref 1.5–2.5)
Monocytes Absolute: 0.7 10*3/uL (ref 0.1–1.0)
Neutrophils Relative %: 70.1 % (ref 43.0–77.0)
Platelets: 190 10*3/uL (ref 150.0–400.0)
Pro B Natriuretic peptide (BNP): 43 pg/mL (ref 0.0–100.0)
RBC: 4.15 M/uL (ref 3.87–5.11)
TSH: 1.67 microintl units/mL (ref 0.35–5.50)
WBC: 10.9 10*3/uL — ABNORMAL HIGH (ref 4.5–10.5)

## 2009-04-21 ENCOUNTER — Ambulatory Visit: Payer: Self-pay | Admitting: Endocrinology

## 2009-04-21 LAB — CONVERTED CEMR LAB: PTH: 146.5 pg/mL — ABNORMAL HIGH (ref 14.0–72.0)

## 2009-04-28 ENCOUNTER — Ambulatory Visit: Payer: Self-pay | Admitting: Critical Care Medicine

## 2009-05-16 ENCOUNTER — Ambulatory Visit: Payer: Self-pay | Admitting: Endocrinology

## 2009-05-16 LAB — CONVERTED CEMR LAB
BUN: 51 mg/dL — ABNORMAL HIGH (ref 6–23)
Bilirubin Urine: NEGATIVE
Blood in Urine, dipstick: NEGATIVE
CO2: 34 meq/L — ABNORMAL HIGH (ref 19–32)
Chloride: 100 meq/L (ref 96–112)
GFR calc non Af Amer: 33.02 mL/min (ref >60)
Glucose, Bld: 135 mg/dL — ABNORMAL HIGH (ref 70–99)
Glucose, Urine, Semiquant: NEGATIVE
Ketones, urine, test strip: NEGATIVE
Potassium: 3.9 meq/L (ref 3.5–5.1)
Sodium: 142 meq/L (ref 135–145)
Specific Gravity, Urine: <1.005
Urobilinogen, UA: 0.2

## 2009-05-28 ENCOUNTER — Ambulatory Visit: Payer: Self-pay | Admitting: Critical Care Medicine

## 2009-05-29 ENCOUNTER — Telehealth (INDEPENDENT_AMBULATORY_CARE_PROVIDER_SITE_OTHER): Payer: Self-pay | Admitting: *Deleted

## 2009-06-19 ENCOUNTER — Ambulatory Visit: Payer: Self-pay | Admitting: Endocrinology

## 2009-06-26 ENCOUNTER — Encounter: Payer: Self-pay | Admitting: Internal Medicine

## 2009-06-26 ENCOUNTER — Ambulatory Visit: Payer: Self-pay | Admitting: Cardiology

## 2009-06-26 ENCOUNTER — Ambulatory Visit: Payer: Self-pay

## 2009-06-30 ENCOUNTER — Ambulatory Visit: Payer: Self-pay

## 2009-06-30 ENCOUNTER — Ambulatory Visit: Payer: Self-pay | Admitting: Cardiology

## 2009-07-01 LAB — CONVERTED CEMR LAB
BUN: 63 mg/dL — ABNORMAL HIGH (ref 6–23)
CO2: 37 meq/L — ABNORMAL HIGH (ref 19–32)
Calcium: 9.4 mg/dL (ref 8.4–10.5)
Glucose, Bld: 110 mg/dL — ABNORMAL HIGH (ref 70–99)
Sodium: 142 meq/L (ref 135–145)

## 2009-07-04 ENCOUNTER — Ambulatory Visit: Payer: Self-pay | Admitting: Cardiology

## 2009-07-04 ENCOUNTER — Telehealth (INDEPENDENT_AMBULATORY_CARE_PROVIDER_SITE_OTHER): Payer: Self-pay | Admitting: *Deleted

## 2009-07-04 LAB — CONVERTED CEMR LAB
BUN: 47 mg/dL — ABNORMAL HIGH (ref 6–23)
CO2: 34 meq/L — ABNORMAL HIGH (ref 19–32)
Calcium: 9.4 mg/dL (ref 8.4–10.5)
Chloride: 97 meq/L (ref 96–112)
Creatinine, Ser: 2 mg/dL — ABNORMAL HIGH (ref 0.4–1.2)

## 2009-07-08 ENCOUNTER — Ambulatory Visit: Payer: Self-pay | Admitting: Endocrinology

## 2009-07-09 ENCOUNTER — Inpatient Hospital Stay (HOSPITAL_COMMUNITY): Admission: AD | Admit: 2009-07-09 | Discharge: 2009-07-17 | Payer: Self-pay | Admitting: Cardiology

## 2009-07-09 ENCOUNTER — Encounter: Payer: Self-pay | Admitting: Emergency Medicine

## 2009-07-09 ENCOUNTER — Ambulatory Visit: Payer: Self-pay | Admitting: Cardiology

## 2009-07-09 ENCOUNTER — Ambulatory Visit: Payer: Self-pay | Admitting: Pulmonary Disease

## 2009-07-09 LAB — CONVERTED CEMR LAB
BUN: 39 mg/dL — ABNORMAL HIGH (ref 6–23)
Calcium: 9.3 mg/dL (ref 8.4–10.5)
GFR calc non Af Amer: 25.52 mL/min (ref >60)
Glucose, Bld: 174 mg/dL — ABNORMAL HIGH (ref 70–99)
Pro B Natriuretic peptide (BNP): 49 pg/mL (ref 0.0–100.0)
Sodium: 141 meq/L (ref 135–145)

## 2009-07-10 ENCOUNTER — Encounter: Payer: Self-pay | Admitting: Pulmonary Disease

## 2009-07-14 ENCOUNTER — Encounter: Payer: Self-pay | Admitting: Critical Care Medicine

## 2009-07-23 ENCOUNTER — Encounter: Payer: Self-pay | Admitting: Adult Health

## 2009-07-30 ENCOUNTER — Ambulatory Visit: Payer: Self-pay | Admitting: Pulmonary Disease

## 2009-08-11 ENCOUNTER — Ambulatory Visit: Payer: Self-pay | Admitting: Endocrinology

## 2009-09-10 ENCOUNTER — Ambulatory Visit: Payer: Self-pay | Admitting: Critical Care Medicine

## 2009-09-12 ENCOUNTER — Emergency Department (HOSPITAL_COMMUNITY): Admission: EM | Admit: 2009-09-12 | Discharge: 2009-09-12 | Payer: Self-pay | Admitting: Emergency Medicine

## 2009-10-27 ENCOUNTER — Ambulatory Visit: Payer: Self-pay | Admitting: Critical Care Medicine

## 2009-11-06 ENCOUNTER — Ambulatory Visit: Payer: Self-pay | Admitting: Cardiology

## 2009-12-23 ENCOUNTER — Encounter: Payer: Self-pay | Admitting: Critical Care Medicine

## 2009-12-30 ENCOUNTER — Ambulatory Visit: Payer: Self-pay | Admitting: Internal Medicine

## 2009-12-30 ENCOUNTER — Telehealth: Payer: Self-pay | Admitting: Critical Care Medicine

## 2010-03-06 ENCOUNTER — Ambulatory Visit: Payer: Self-pay | Admitting: Critical Care Medicine

## 2010-03-06 DIAGNOSIS — R300 Dysuria: Secondary | ICD-10-CM | POA: Insufficient documentation

## 2010-03-06 LAB — CONVERTED CEMR LAB
Eosinophils Relative: 4.4 % (ref 0.0–5.0)
Hemoglobin, Urine: NEGATIVE
Lymphocytes Relative: 23.5 % (ref 12.0–46.0)
Monocytes Absolute: 0.7 10*3/uL (ref 0.1–1.0)
Monocytes Relative: 7.7 % (ref 3.0–12.0)
Neutrophils Relative %: 64 % (ref 43.0–77.0)
Nitrite: NEGATIVE
Platelets: 233 10*3/uL (ref 150.0–400.0)
Specific Gravity, Urine: 1.01 (ref 1.000–1.030)
Total Protein, Urine: NEGATIVE mg/dL
WBC: 8.6 10*3/uL (ref 4.5–10.5)
pH: 5.5 (ref 5.0–8.0)

## 2010-03-09 ENCOUNTER — Telehealth: Payer: Self-pay | Admitting: Critical Care Medicine

## 2010-04-29 ENCOUNTER — Ambulatory Visit: Payer: Self-pay | Admitting: Critical Care Medicine

## 2010-05-13 ENCOUNTER — Ambulatory Visit: Payer: Self-pay | Admitting: Critical Care Medicine

## 2010-05-13 ENCOUNTER — Inpatient Hospital Stay (HOSPITAL_COMMUNITY): Admission: AD | Admit: 2010-05-13 | Discharge: 2010-05-19 | Payer: Self-pay | Admitting: Critical Care Medicine

## 2010-05-13 DIAGNOSIS — R142 Eructation: Secondary | ICD-10-CM

## 2010-05-13 DIAGNOSIS — R143 Flatulence: Secondary | ICD-10-CM

## 2010-05-13 DIAGNOSIS — R141 Gas pain: Secondary | ICD-10-CM

## 2010-05-20 ENCOUNTER — Encounter: Payer: Self-pay | Admitting: Critical Care Medicine

## 2010-05-20 ENCOUNTER — Telehealth: Payer: Self-pay | Admitting: Critical Care Medicine

## 2010-05-21 ENCOUNTER — Encounter: Payer: Self-pay | Admitting: Critical Care Medicine

## 2010-05-26 ENCOUNTER — Ambulatory Visit: Payer: Self-pay | Admitting: Internal Medicine

## 2010-05-27 ENCOUNTER — Ambulatory Visit: Payer: Self-pay | Admitting: Cardiology

## 2010-05-27 ENCOUNTER — Encounter: Payer: Self-pay | Admitting: Internal Medicine

## 2010-05-29 ENCOUNTER — Encounter: Payer: Self-pay | Admitting: Critical Care Medicine

## 2010-06-09 ENCOUNTER — Ambulatory Visit: Payer: Self-pay | Admitting: Critical Care Medicine

## 2010-08-11 ENCOUNTER — Telehealth: Payer: Self-pay | Admitting: Critical Care Medicine

## 2010-08-12 ENCOUNTER — Ambulatory Visit: Payer: Self-pay | Admitting: Critical Care Medicine

## 2010-10-01 ENCOUNTER — Encounter: Payer: Self-pay | Admitting: Critical Care Medicine

## 2010-10-11 LAB — CONVERTED CEMR LAB
BUN: 37 mg/dL — ABNORMAL HIGH (ref 6–23)
BUN: 47 mg/dL — ABNORMAL HIGH (ref 6–23)
CO2: 32 meq/L (ref 19–32)
Calcium: 9.3 mg/dL (ref 8.4–10.5)
Chloride: 98 meq/L (ref 96–112)
Creatinine, Ser: 1.7 mg/dL — ABNORMAL HIGH (ref 0.4–1.2)
Creatinine, Ser: 1.9 mg/dL — ABNORMAL HIGH (ref 0.4–1.2)
GFR calc non Af Amer: 30.75 mL/min (ref >60)
Potassium: 5.2 meq/L — ABNORMAL HIGH (ref 3.5–5.1)

## 2010-10-12 ENCOUNTER — Ambulatory Visit
Admission: RE | Admit: 2010-10-12 | Discharge: 2010-10-12 | Payer: Self-pay | Source: Home / Self Care | Attending: Critical Care Medicine | Admitting: Critical Care Medicine

## 2010-10-13 ENCOUNTER — Ambulatory Visit
Admission: RE | Admit: 2010-10-13 | Discharge: 2010-10-13 | Payer: Self-pay | Source: Home / Self Care | Attending: Vascular Surgery | Admitting: Vascular Surgery

## 2010-10-14 NOTE — Consult Note (Addendum)
NEW PATIENT CONSULTATION  [name], [name] M DOB:  [birth year]                                       10/13/2010 EAVWU#:[id number]  HISTORY OF PRESENT ILLNESS:  The patient is an 77 year old female resident of a local nursing facility [name] and [name]) who was referred for swelling and circulation problems to her legs.  She has multiple medical problems and is nonambulatory.  She states she spends most of the day in bed with her legs sometimes elevated.  She has no history of DVT, stasis ulcers, thrombophlebitis, or bleeding, but does have chronic edema in her legs which has worsened over the past few years.  She had occasionally worn elastic stockings many years ago but not recently.  CHRONIC MEDICAL PROBLEMS: 1. Diabetes mellitus. 2. COPD on home oxygen. 3. Hypertension. 4. Hyperlipidemia. 5. Coronary artery disease. 6. Obesity. 7. Chronic renal insufficiency stage 3. 8. GERD. 9. Pacemaker for arrhythmias placed by Dr. Graciela Husbands in 2002.  SOCIAL HISTORY:  She is widowed, lives in nursing facility, is retired, has 1 child, does not use tobacco or alcohol.  FAMILY HISTORY:  Negative for coronary artery disease, diabetes, or stroke.  REVIEW OF SYSTEMS:  Has chronic dyspnea.  Has weight gain.  Is on home oxygen.  Discomfort in her legs chronically.  Has orthopnea.  Chronic renal insufficiency.  All other systems are negative in a complete review of systems.  PHYSICAL EXAM:  Vital signs:  Blood pressure 136/64, heart rate 82, respirations 16.  O2 sats 98% on 3 L.  General:  She is a chronically ill-appearing, obese female who is in no apparent stress.  Alert and oriented x3.  She is on nasal oxygen.  HEENT exam is normal for age. EOMs intact.  Lungs:  Clear to auscultation.  No rhonchi or wheezing. Cardiovascular:  Regular rhythm.  No murmurs.  Carotid pulses 3+.  No bruits.  She has a pacemaker in the left infraclavicular area.  Abdomen: Obese.  No  palpable masses.  Musculoskeletal exam is free of major deformities.  Neurologic exam is normal.  Lower extremity exam reveals 2+ edema in both legs from the knees to the feet.  She has hyperpigmentation in the pretibial areas with some mild erythema with some hypertrophic nodules in the skin.  Both feet are adequately perfused, pink, and without gangrene or ulceration.  Lower extremity arterial Dopplers were performed prior to her visit which revealed ABIs around 0.7 bilaterally.  ASSESSMENT AND PLAN:  This patient has chronic venous insufficiency and some mild arterial insufficiency which does not need treatment.  She needs: 1. Elevation. 2. Elastic compression stockings. 3. Diuresis as much as medically feasible.  I had no further recommendations regarding her treatment.    [name], M.D. Electronically Signed  JDL/MEDQ  D:  10/13/2010  T:  10/14/2010  Job:  [id number]  cc:   [name], MD

## 2010-10-15 NOTE — Assessment & Plan Note (Signed)
Summary: Admission History and Physical   Primary [name]/Referring [name]:  Dr. Waymon Amato  CC:  Follow up.  Pt states she is a little better since OV with TP on 8.17.11 but still having increased SOB when walking, chest heaviness, wheezing, and prod cough with thick white mucus.  Mucus also has "spots of yellow in it."  Pt fell approx 2 wks ago -- having pain and soreness under right breast since falling.[name]  History of Present Illness: 77yowf quit smoking in 1987  and chronic renal insufficiency with coronary artery disease.  The patient had history of congestive heart failure and diabetes.  The patient has been hospitalized on numerous occasions for respiratory failure and pneumonia.  Patient currently is residing in a nursing care facility.   May 13, 2010 11:24 AM Does not feel well for two weeks.   Stays ill and cannot eat.  Wants to have emesis but wants to gag. Notes more dyspnea and cough.  Difficult to go to bathroom with out sitting down No real fever.  No dysuria.  No change in bowels.  Pt with mild resp distress on presentation.  Pt with adult FTT.  Was just here on 8/17 and saw Np for copd exac and rx avelox. Now is worse.  Pt admitted for copd exac and poss bowel obstruction   Preventive Screening-Counseling & Management  Alcohol-Tobacco     Smoking Status: quit > 6 months  Current Medications (verified): 1)  Lantus 100 Unit/ml Soln (Insulin Glargine) .... 5 Untis At Bedtime 2)  Senokot S 8.6-50 Mg  Tabs (Sennosides-Docusate Sodium) .... One Tab At Bedtime 3)  Spiriva Handihaler 18 Mcg  Caps (Tiotropium Bromide Monohydrate) .... Two Puffs in Handihaler Daily 4)  Novolog 100 Unit/ml Soln (Insulin Aspart) .... Sliding Scale With Breakfast, Lunch and Dinner and At Bedtime 5)  Albuterol Sulfate (2.5 Mg/33ml) 0.083% Nebu (Albuterol Sulfate) .... Administer 6ml Via Neb Four Times Daily 6)  Novolog 100 Unit/ml Soln (Insulin Aspart) .... 5 Units Three Times A Day Ac 7)  Aspir-Low  81 Mg Tbec (Aspirin) .... Once Daily 8)  Diltiazem Hcl Coated Beads 180 Mg Xr24h-Cap (Diltiazem Hcl Coated Beads) .... One Daily 9)  Ferrous Sulfate 325 (65 Fe) Mg Tabs (Ferrous Sulfate) .... Once Daily 10)  Colace 100 Mg Caps (Docusate Sodium) .... Two Times A Day 11)  Demadex 20 Mg Tabs (Torsemide) .... 2 Tabs Two Times A Day 12)  Pulmicort 0.25 Mg/60ml Susp (Budesonide) .... Four Times Daily 13)  Crestor 20 Mg Tabs (Rosuvastatin Calcium) .... Once Daily 14)  Protonix 40 Mg Tbec (Pantoprazole Sodium) .... Once Daily 15)  Allopurinol 100 Mg Tabs (Allopurinol) .... Two Times A Day 16)  Hypotears 1-1 % Soln (Polyethyl Glycol-Polyvinyl Alc) .... 2 Drops Each Eye 17)  Lovaza 1 Gm Caps (Omega-3-Acid Ethyl Esters) .... Take 1 Capsule By Mouth Two Times A Day 18)  Metolazone 2.5 Mg Tabs (Metolazone) .... Take One Tablet By Mouth Twice Weekly 19)  Klor-Con 10 10 Meq Cr-Tabs (Potassium Chloride) .... 4 Two Times A Day 20)  Ambien 10 Mg Tabs (Zolpidem Tartrate) .... At Bedtime As Needed 21)  Enulose 10 Gm/16ml Soln (Lactulose Encephalopathy) .... 30ml By Mouth Two Times A Day - Hold For Diarrhea 22)  Oxygen .... 2l/min Via Nasal Cannula Continuously 23)  Tramadol Hcl 50 Mg Tabs (Tramadol Hcl) .... Take 1 Tablet By Mouth Every 6 Hours As Needed 24)  Robaxin 500 Mg Tabs (Methocarbamol) .... 1/2 Tab As Needed Cramps 25)  Lorazepam  0.5 Mg Tabs (Lorazepam) .... 1/2 Tab Two Times A Day As Needed Anxiety 26)  Tylenol 325 Mg Tabs (Acetaminophen) .... 2 Every 6 Hours As Needed Pain or Fever 27)  Promethazine Hcl 12.5 Mg Tabs (Promethazine Hcl) .... Take 1 Tablet By Mouth Every 6 Hours As Needed Nausea 28)  Delsym 30 Mg/9ml Lqcr (Dextromethorphan Polistirex) .[name].. 10ml Every 12 Hours As Needed Cough 29)  Vicodin 5-500 Mg Tabs (Hydrocodone-Acetaminophen) .[name].. 1 Tab By Mouth Every 4 Hours As Needed Pain 30)  Acidophilus Probiotic 100 Mg Caps (Lactobacillus) .... Two Times A Day  Allergies (verified): 1)  !  Codeine 2)  ! Penicillin 3)  ! * Guafenisin  Past History:  Past medical, surgical, family and social histories (including risk factors) reviewed, and no changes noted (except as noted below).  Past Medical History: Reviewed history from 12/29/2009 and no changes required. Congestive heart failure - diastolic COPD History of C Diff colitis Diabetes mellitus, type II Hypertension Hyperlipidemia Anemia-NOS Coronary artery disease (Catheterization April 2006 demonstrated LAD 40% stenosis.  This proximal stent which was patent with 40-50% stenosis.  First diagonal had ostial 60% stenosis, second diagonal was normal.  The circumflex had luminal irregularities.  There was a ramus intermediate with ostial 60% stenosis, mid obtuse marginal had luminal irregularities, the right coronary artery is dominant with luminal irregularities.  The patient had PTCA of the LAD in 2002) Asystolic arrest  with pacemaker 2002 Renal insufficiency Pulmonary HTN Gallstones  Current Problems:  CORONARY ARTERY DISEASE (ICD-414.00) DIASTOLIC HEART FAILURE, CHRONIC (ICD-428.32) PACEMAKER, PERMANENT; MDT KAPPA (ICD-V45.01) SINOATRIAL NODE DYSFUNCTION (ICD-427.81) SYNCOPE (ICD-780.2) PERCUTANEOUS TRANSLUMINAL CORONARY ANGIOPLASTY, HX OF (ICD-V45.82) HYPERLIPIDEMIA (ICD-272.4) HYPERTENSION (ICD-401.9) DYSLIPIDEMIA (ICD-272.4) CHRONIC OBSTRUCTIVE PULMONARY DISEASE, ACUTE EXACERBATION (ICD-491.21) COPD (ICD-496) ACUTE BRONCHITIS (ICD-466.0) CHOLELITHIASIS, HX OF (ICD-V12.79) RENAL INSUFFICIENCY (ICD-588.9) ANEMIA-NOS (ICD-285.9) OBESITY (ICD-278.00) * SACRAL ULCERS CLOSTRIDIUM DIFFICILE COLITIS, HX OF DIARRHEA (ICD-V12.79) OSTEOARTHRITIS (ICD-715.90) ANEMIA (ICD-285.9) ALLERGIC RHINITIS (ICD-477.9) STASIS DERMATITIS (ICD-454.1) GOUT (ICD-274.9) DIABETES MELLITUS, TYPE II (ICD-250.00)      Past Surgical History: Reviewed history from 11/30/2007 and no changes required. PERCUTANEOUS  TRANSLUMINAL CORONARY ANGIOPLASTY, HX OF (ICD-V45.82) 2002  APPENDECTOMY, HX OF (ICD-V45.79) HYSTERECTOMY, HX OF (ICD-V45.77) PACEMAKER, PERMANENT (ICD-V45.01) 2002  Family History: Reviewed history from 10/12/2007 and no changes required. mother with DM, HTN father with heart disease  Social History: Reviewed history from 04/01/2009 and no changes required. lives at [facility] Former Smoker quit in 1987 Alcohol use-no 1 child widowed retired Hospital doctor  Review of Systems       The patient complains of shortness of breath with activity, shortness of breath at rest, productive cough, non-productive cough, loss of appetite, abdominal pain, and change in color of mucus.  The patient denies coughing up blood, chest pain, irregular heartbeats, acid heartburn, indigestion, weight change, difficulty swallowing, sore throat, tooth/dental problems, headaches, nasal congestion/difficulty breathing through nose, sneezing, itching, ear ache, anxiety, depression, hand/feet swelling, joint stiffness or pain, rash, and fever.    Vital Signs:  Patient profile:   77 year old female Height:      59 inches Weight:      172 pounds BMI:     34.87 O2 Sat:      99 % on 2 L/minpulsed Temp:     98.5 degrees F oral Pulse rate:   55 / minute BP sitting:   106 / 72  (left arm) Cuff size:   regular  Vitals Entered By: Gweneth Dimitri RN (May 13, 2010 11:18 AM)  O2 Flow:  2 L/minpulsed CC: Follow up.  Pt states she is a little better since OV with TP on 8.17.11 but still having increased SOB when walking, chest heaviness, wheezing, prod cough with thick white mucus.  Mucus also has "spots of yellow in it."  Pt fell approx 2 wks ago -- having pain and soreness under right breast since falling. Comments Medications reviewed with patient Daytime contact number verified with patient. Gweneth Dimitri RN  May 13, 2010 11:19 AM    Physical Exam  General:  on supplemental oxygen and dyspneic.     Nose:  clear nasal discharge.   Mouth:  no deformity or lesions Neck:  no masses, thyromegaly, or abnormal cervical nodes Lungs:  decreased BS bilateral and wheezes bilateral.   Heart:  regular rate and rhythm, S1, S2 without murmurs, rubs, gallops, or clicks Abdomen:  distended, bs decreased, tender in RLQ tympanitic to percussion Rectal:  deferred Genitalia:  deferred Msk:  no deformity or scoliosis noted with normal posture Extremities:  bilateral pedal edema.   Neurologic:  CN II-XII grossly intact with normal reflexes, coordination, muscle strength and tone Skin:  erythema.  both LE Cervical Nodes:  no significant adenopathy Axillary Nodes:  no significant adenopathy Inguinal Nodes:  no significant adenopathy Psych:  anxious.     Impression & Recommendations:  Problem # 1:  COPD (ICD-496) Assessment Deteriorated copd exacerbation plan  admit iv medrol iv rocephin neb BDs  Problem # 2:  ABDOMINAL DISTENSION (ICD-787.3) Assessment: Deteriorated  abdominal distension with prob ileus, r/o bowel obstruction plan kub GI consult   Orders: No Charge Patient Arrived (NCPA0) (NCPA0)  Medications Added to Medication List This Visit: 1)  Klor-con 10 10 Meq Cr-tabs (Potassium chloride) .... 4 two times a day 2)  Tramadol Hcl 50 Mg Tabs (Tramadol hcl) .... Take 1 tablet by mouth every 6 hours as needed 3)  Acidophilus Probiotic 100 Mg Caps (Lactobacillus) .... Two times a day  Complete Medication List: 1)  Lantus 100 Unit/ml Soln (Insulin glargine) .... 5 untis at bedtime 2)  Senokot S 8.6-50 Mg Tabs (Sennosides-docusate sodium) .... One tab at bedtime 3)  Spiriva Handihaler 18 Mcg Caps (Tiotropium bromide monohydrate) .... Two puffs in handihaler daily 4)  Novolog 100 Unit/ml Soln (Insulin aspart) .... Sliding scale with breakfast, lunch and dinner and at bedtime 5)  Albuterol Sulfate (2.5 Mg/104ml) 0.083% Nebu (Albuterol sulfate) .... Administer 6ml via neb four times  daily 6)  Novolog 100 Unit/ml Soln (Insulin aspart) .... 5 units three times a day ac 7)  Aspir-low 81 Mg Tbec (Aspirin) .... Once daily 8)  Diltiazem Hcl Coated Beads 180 Mg Xr24h-cap (Diltiazem hcl coated beads) .... One daily 9)  Ferrous Sulfate 325 (65 Fe) Mg Tabs (Ferrous sulfate) .... Once daily 10)  Colace 100 Mg Caps (Docusate sodium) .... Two times a day 11)  Demadex 20 Mg Tabs (Torsemide) .... 2 tabs two times a day 12)  Pulmicort 0.25 Mg/103ml Susp (Budesonide) .... Four times daily 13)  Crestor 20 Mg Tabs (Rosuvastatin calcium) .... Once daily 14)  Protonix 40 Mg Tbec (Pantoprazole sodium) .... Once daily 15)  Allopurinol 100 Mg Tabs (Allopurinol) .... Two times a day 16)  Hypotears 1-1 % Soln (Polyethyl glycol-polyvinyl alc) .... 2 drops each eye 17)  Lovaza 1 Gm Caps (Omega-3-acid ethyl esters) .... Take 1 capsule by mouth two times a day 18)  Metolazone 2.5 Mg Tabs (Metolazone) .... Take one tablet by mouth twice weekly 19)  Klor-con 10 10  Meq Cr-tabs (Potassium chloride) .... 4 two times a day 20)  Ambien 10 Mg Tabs (Zolpidem tartrate) .... At bedtime as needed 21)  Enulose 10 Gm/56ml Soln (Lactulose encephalopathy) .... 30ml by mouth two times a day - hold for diarrhea 22)  Oxygen  .... 2l/min via nasal cannula continuously 23)  Tramadol Hcl 50 Mg Tabs (Tramadol hcl) .... Take 1 tablet by mouth every 6 hours as needed 24)  Robaxin 500 Mg Tabs (Methocarbamol) .... 1/2 tab as needed cramps 25)  Lorazepam 0.5 Mg Tabs (Lorazepam) .... 1/2 tab two times a day as needed anxiety 26)  Tylenol 325 Mg Tabs (Acetaminophen) .... 2 every 6 hours as needed pain or fever 27)  Promethazine Hcl 12.5 Mg Tabs (Promethazine hcl) .... Take 1 tablet by mouth every 6 hours as needed nausea 28)  Delsym 30 Mg/38ml Lqcr (Dextromethorphan polistirex) .[name].. 10ml every 12 hours as needed cough 29)  Vicodin 5-500 Mg Tabs (Hydrocodone-acetaminophen) .[name].. 1 tab by mouth every 4 hours as needed pain 30)   Acidophilus Probiotic 100 Mg Caps (Lactobacillus) .... Two times a day  Patient Instructions: 1)  You will be admitted to [facility]

## 2010-10-15 NOTE — Progress Notes (Signed)
Summary: Acute bronchitis  Phone Note Outgoing Call   Reason for Call: Get patient information Summary of Call: Call the pt, she is at [facility]  she needs to start Ceftin 250mg  two times a day for 7 days by mouth she will need ov with T parrett if unimproved    Initial call taken by: Storm Frisk MD,  December 30, 2009 2:43 PM  Follow-up for Phone Call        Spoke with pt's nurse Rosyln Page at [facility].  Gave order for ceftin 250 mg two times a day x 7 days, and advised that if pt is no better soon she will need to come in for appt.  Rosalyn verbalized understanding. Follow-up by: Vernie Murders,  December 30, 2009 3:44 PM     Appended Document: Acute bronchitis noted

## 2010-10-15 NOTE — Cardiovascular Report (Signed)
Summary: Office Visit   Office Visit   Imported By: Roderic Ovens 06/12/2010 12:41:27  _____________________________________________________________________  External Attachment:    Type:   Image     Comment:   External Document

## 2010-10-15 NOTE — Assessment & Plan Note (Signed)
Summary: Pulmonary OV   Primary Provider/Referring Provider:  Dr. Waymon Amato  CC:  2 wk follow up.  Pt states she is still having SOB and chest heaviness when up walking around.  Prod cough with thick white mucus.  Denies wheezing and chest tightness. Marland Kitchen  History of Present Illness: 77yowf quit smoking in 1987  and chronic renal insufficiency with coronary artery disease.  The patient had history of congestive heart failure and diabetes.  The patient has been hospitalized on numerous occasions for respiratory failure and pneumonia.  Patient currently is residing in a nursing care facility.   May 13, 2010 11:24 AM Does not feel well for two weeks.   Stays ill and cannot eat.  Wants to have emesis but wants to gag. Notes more dyspnea and cough.  Difficult to go to bathroom with out sitting down No real fever.  No dysuria.  No change in bowels.  Pt with mild resp distress on presentation.  Pt with adult FTT.  Was just here on 8/17 and saw Np for copd exac and rx avelox. Now is worse.  Pt admitted for copd exac and poss bowel obstruction  May 26, 2010 --Presents for post hospital follow up. Says she is feeling much better.  05/13/2010- :  05/19/2010 for an Acute exacerbation of chronic obstructive pulmonary disease.  .  Chest x-ray w/ chronic changes.  Complicated by multiple comorbidities including diastolic heart     failure, cor pulmonale, deconditioning and stage III chronic kidney  disease.   Tx w/ inhaled     bronchodilators, oral antibiotics and IV steroids.  She has finished abx/steroids.  She was tx for constipation w/ aggressive bowel regimen. Since discharge she is much better. Has been having regular bowel movements. Denies chest pain,  orthopnea, hemoptysis, fever, n/v/d,  headache.   June 09, 2010 2:43 PM Doing ok.   Still dyspneic with exertion and heavy in the chest area.  Notes some mucus and is white and thick.   Still edematous in the feet.   Is ambulatory .     Preventive Screening-Counseling & Management  Alcohol-Tobacco     Smoking Status: quit > 6 months  Current Medications (verified): 1)  Pulmicort 0.25 Mg/46ml Susp (Budesonide) .... Four Times Daily 2)  Spiriva Handihaler 18 Mcg  Caps (Tiotropium Bromide Monohydrate) .... Two Puffs in Handihaler Daily 3)  Lantus 100 Unit/ml Soln (Insulin Glargine) .Marland Kitchen.. 15 Untis At Bedtime 4)  Albuterol Sulfate (2.5 Mg/56ml) 0.083% Nebu (Albuterol Sulfate) .... Administer 6ml Via Neb Four Times Daily 5)  Novolog 100 Unit/ml Soln (Insulin Aspart) .... 6 Units Three Times A Day Before Meals 6)  Senokot S 8.6-50 Mg  Tabs (Sennosides-Docusate Sodium) .... One Tab At Bedtime 7)  Aspir-Low 81 Mg Tbec (Aspirin) .... Once Daily 8)  Diltiazem Hcl Cr 240 Mg Xr24h-Cap (Diltiazem Hcl) .... Take 1 Capsule By Mouth Once A Day 9)  Ferrous Sulfate 325 (65 Fe) Mg Tabs (Ferrous Sulfate) .... Two Times A Day 10)  Demadex 20 Mg Tabs (Torsemide) .... 2 Tabs Two Times A Day 11)  Crestor 20 Mg Tabs (Rosuvastatin Calcium) .... Once Daily 12)  Protonix 40 Mg Tbec (Pantoprazole Sodium) .... Once Daily 13)  Enulose 10 Gm/58ml Soln (Lactulose Encephalopathy) .... 30ml By Mouth Two Times A Day - Hold For Diarrhea 14)  Allopurinol 100 Mg Tabs (Allopurinol) .... Two Times A Day 15)  Lovaza 1 Gm Caps (Omega-3-Acid Ethyl Esters) .... Take 1 Tablet By Mouth Once A Day 16)  Klor-Con M20 20 Meq Cr-Tabs (Potassium Chloride Crys Cr) .Marland Kitchen.. 1 By Mouth Four Times Daily 17)  Zolpidem Tartrate 5 Mg Tabs (Zolpidem Tartrate) .... Take 1 Tab By Mouth At Bedtime As Needed 18)  Tramadol Hcl 50 Mg Tabs (Tramadol Hcl) .... Take 1 Tablet By Mouth Every 6 Hours As Needed 19)  Lorazepam 0.5 Mg Tabs (Lorazepam) .... 1/2-1 Tab Two Times A Day As Needed Anxiety 20)  Delsym 30 Mg/42ml Lqcr (Dextromethorphan Polistirex) .Marland Kitchen.. 10ml Every 12 Hours As Needed Cough 21)  Oxygen .... 2l/min Via Nasal Cannula Continuously; Increase To 3l/min With Activity  Allergies  (verified): 1)  ! Codeine 2)  ! Penicillin 3)  ! * Guafenisin  Past History:  Past medical, surgical, family and social histories (including risk factors) reviewed, and no changes noted (except as noted below).  Past Medical History: Reviewed history from 12/29/2009 and no changes required. Congestive heart failure - diastolic COPD History of C Diff colitis Diabetes mellitus, type II Hypertension Hyperlipidemia Anemia-NOS Coronary artery disease (Catheterization April 2006 demonstrated LAD 40% stenosis.  This proximal stent which was patent with 40-50% stenosis.  First diagonal had ostial 60% stenosis, second diagonal was normal.  The circumflex had luminal irregularities.  There was a ramus intermediate with ostial 60% stenosis, mid obtuse marginal had luminal irregularities, the right coronary artery is dominant with luminal irregularities.  The patient had PTCA of the LAD in 2002) Asystolic arrest  with pacemaker 2002 Renal insufficiency Pulmonary HTN Gallstones  Current Problems:  CORONARY ARTERY DISEASE (ICD-414.00) DIASTOLIC HEART FAILURE, CHRONIC (ICD-428.32) PACEMAKER, PERMANENT; MDT KAPPA (ICD-V45.01) SINOATRIAL NODE DYSFUNCTION (ICD-427.81) SYNCOPE (ICD-780.2) PERCUTANEOUS TRANSLUMINAL CORONARY ANGIOPLASTY, HX OF (ICD-V45.82) HYPERLIPIDEMIA (ICD-272.4) HYPERTENSION (ICD-401.9) DYSLIPIDEMIA (ICD-272.4) CHRONIC OBSTRUCTIVE PULMONARY DISEASE, ACUTE EXACERBATION (ICD-491.21) COPD (ICD-496) ACUTE BRONCHITIS (ICD-466.0) CHOLELITHIASIS, HX OF (ICD-V12.79) RENAL INSUFFICIENCY (ICD-588.9) ANEMIA-NOS (ICD-285.9) OBESITY (ICD-278.00) * SACRAL ULCERS CLOSTRIDIUM DIFFICILE COLITIS, HX OF DIARRHEA (ICD-V12.79) OSTEOARTHRITIS (ICD-715.90) ANEMIA (ICD-285.9) ALLERGIC RHINITIS (ICD-477.9) STASIS DERMATITIS (ICD-454.1) GOUT (ICD-274.9) DIABETES MELLITUS, TYPE II (ICD-250.00)      Past Surgical History: Reviewed history from 11/30/2007 and no changes  required. PERCUTANEOUS TRANSLUMINAL CORONARY ANGIOPLASTY, HX OF (ICD-V45.82) 2002  APPENDECTOMY, HX OF (ICD-V45.79) HYSTERECTOMY, HX OF (ICD-V45.77) PACEMAKER, PERMANENT (ICD-V45.01) 2002  Family History: Reviewed history from 10/12/2007 and no changes required. mother with DM, HTN father with heart disease  Social History: Reviewed history from 04/01/2009 and no changes required. lives at [facility] Former Smoker quit in 1987.  2 ppd x 25 yrs. Alcohol use-no 1 child widowed retired Hospital doctor  Review of Systems       The patient complains of shortness of breath with activity, productive cough, and hand/feet swelling.  The patient denies shortness of breath at rest, non-productive cough, coughing up blood, chest pain, irregular heartbeats, acid heartburn, indigestion, loss of appetite, weight change, abdominal pain, difficulty swallowing, sore throat, tooth/dental problems, headaches, nasal congestion/difficulty breathing through nose, sneezing, itching, ear ache, anxiety, depression, joint stiffness or pain, rash, change in color of mucus, and fever.    Vital Signs:  Patient profile:   77 year old female Height:      59 inches Weight:      179.38 pounds BMI:     36.36 O2 Sat:      98 % on 3 L/minpulsed Temp:     98.1 degrees F oral Pulse rate:   80 / minute BP sitting:   134 / 62  (right arm) Cuff size:   regular  Vitals Entered By: Gweneth Dimitri  RN (June 09, 2010 2:32 PM)  O2 Flow:  3 L/minpulsed CC: 2 wk follow up.  Pt states she is still having SOB and chest heaviness when up walking around.  Prod cough with thick white mucus.  Denies wheezing and chest tightness.  Comments Medications reviewed with patient Daytime contact number verified with patient. Gweneth Dimitri RN  June 09, 2010 2:40 PM    Physical Exam  Additional Exam:  Gen: Pleasant, well-nourished, in no distress , normal affect   ENT: no lesions, no post nasal drip Neck: No JVD, no TMG,  no carotid bruits Lungs:Coarse BS w/ no wheeizng  Cardiovascular: RRR, heart sounds normal, no murmurs or gallops, tr-1 + edema.  Abdomen: soft and non-tender, no HSM, BS normal Musculoskeletal: No deformities, no cyanosis or clubbing Neuro: alert, non-focal     Impression & Recommendations:  Problem # 1:  DIASTOLIC HEART FAILURE, CHRONIC (ICD-428.32) Assessment Unchanged Ongoing diastolic chf plan increase demedex to 60mg  two times a day for 5days then reduce to 40mg  two times a day  Her updated medication list for this problem includes:    Aspir-low 81 Mg Tbec (Aspirin) ..... Once daily    Demadex 20 Mg Tabs (Torsemide) .Marland KitchenMarland KitchenMarland KitchenMarland Kitchen 3  tabs two times a day for 5 days then reduce to 2 twice daily  Problem # 2:  COPD (ICD-496) Assessment: Unchanged copd end stage golds stage IV plan No change in inhaled medications.   Maintain treatment program as currently prescribed. pneumonvax  Medications Added to Medication List This Visit: 1)  Demadex 20 Mg Tabs (Torsemide) .... 3  tabs two times a day for 5 days then reduce to 2 twice daily 2)  Enulose 10 Gm/78ml Soln (Lactulose encephalopathy) .... 30ml by mouth once a day,  hold for diarrhea 3)  Klor-con M20 20 Meq Cr-tabs (Potassium chloride crys cr) .Marland Kitchen.. 1 by mouth four times daily 4)  Promethazine Hcl 12.5 Mg Tabs (Promethazine hcl) .... Take one every 6 hours as needed nausea  Complete Medication List: 1)  Pulmicort 0.25 Mg/43ml Susp (Budesonide) .... Four times daily 2)  Spiriva Handihaler 18 Mcg Caps (Tiotropium bromide monohydrate) .... Two puffs in handihaler daily 3)  Lantus 100 Unit/ml Soln (Insulin glargine) .Marland Kitchen.. 15 untis at bedtime 4)  Albuterol Sulfate (2.5 Mg/1ml) 0.083% Nebu (Albuterol sulfate) .... Administer 6ml via neb four times daily 5)  Novolog 100 Unit/ml Soln (Insulin aspart) .... 6 units three times a day before meals 6)  Senokot S 8.6-50 Mg Tabs (Sennosides-docusate sodium) .... One tab at bedtime 7)  Aspir-low 81  Mg Tbec (Aspirin) .... Once daily 8)  Diltiazem Hcl Cr 240 Mg Xr24h-cap (Diltiazem hcl) .... Take 1 capsule by mouth once a day 9)  Ferrous Sulfate 325 (65 Fe) Mg Tabs (Ferrous sulfate) .... Two times a day 10)  Demadex 20 Mg Tabs (Torsemide) .... 3  tabs two times a day for 5 days then reduce to 2 twice daily 11)  Crestor 20 Mg Tabs (Rosuvastatin calcium) .... Once daily 12)  Protonix 40 Mg Tbec (Pantoprazole sodium) .... Once daily 13)  Enulose 10 Gm/73ml Soln (Lactulose encephalopathy) .... 30ml by mouth once a day,  hold for diarrhea 14)  Allopurinol 100 Mg Tabs (Allopurinol) .... Two times a day 15)  Lovaza 1 Gm Caps (Omega-3-acid ethyl esters) .... Take 1 tablet by mouth once a day 16)  Klor-con M20 20 Meq Cr-tabs (Potassium chloride crys cr) .Marland Kitchen.. 1 by mouth four times daily 17)  Zolpidem Tartrate 5 Mg  Tabs (Zolpidem tartrate) .... Take 1 tab by mouth at bedtime as needed 18)  Tramadol Hcl 50 Mg Tabs (Tramadol hcl) .... Take 1 tablet by mouth every 6 hours as needed 19)  Lorazepam 0.5 Mg Tabs (Lorazepam) .... 1/2-1 tab two times a day as needed anxiety 20)  Delsym 30 Mg/24ml Lqcr (Dextromethorphan polistirex) .Marland Kitchen.. 10ml every 12 hours as needed cough 21)  Oxygen  .... 2l/min via nasal cannula continuously; increase to 3l/min with activity 22)  Promethazine Hcl 12.5 Mg Tabs (Promethazine hcl) .... Take one every 6 hours as needed nausea  Other Orders: Est. Patient Level IV (62952) Pneumococcal Vaccine (84132) Admin 1st Vaccine (44010)  Patient Instructions: 1)  Pneumovax today 2)  Increase Demedex to three twice a day for 5 days then reduce to two twice daily 3)  Phenegren one every 6 hours as needed for nausea 4)  No other medication changes 5)  Return two months    Immunizations Administered:  Pneumonia Vaccine:    Vaccine Type: Pneumovax (Medicare)    Site: left deltoid    Mfr: Merck    Dose: 0.5 ml    Route: IM    Given by: Gweneth Dimitri RN    Exp. Date: 11/26/2011     Lot #: [id number]    VIS given: 08/18/09 version given June 09, 2010.   Prevention & Chronic Care Immunizations   Influenza vaccine: Fluvax 3+  (05/26/2010)    Tetanus booster: Not documented    Pneumococcal vaccine: Pneumovax (Medicare)  (06/09/2010)    H. zoster vaccine: Not documented  Colorectal Screening   Hemoccult: Not documented    Colonoscopy: normal  (10/18/2006)  Other Screening   Pap smear: normal  (02/13/2007)    Mammogram: normal  (10/14/2008)    DXA bone density scan: normal  (09/13/2008)   Smoking status: quit > 6 months  (06/09/2010)  Diabetes Mellitus   HgbA1C: 7.2  (04/21/2009)    Eye exam: Not documented    Foot exam: Not documented   High risk foot: Not documented   Foot care education: Not documented    Urine microalbumin/creatinine ratio: Not documented  Lipids   Total Cholesterol: Not documented   LDL: Not documented   LDL Direct: Not documented   HDL: Not documented   Triglycerides: Not documented    SGOT (AST): 16  (02/07/2007)   SGPT (ALT): 6  (02/07/2007)   Alkaline phosphatase: 57  (02/07/2007)   Total bilirubin: 0.6  (02/07/2007)  Hypertension   Last Blood Pressure: 134 / 62  (06/09/2010)   Serum creatinine: 1.7  (11/06/2009)   Serum potassium 4.5  (11/06/2009)  Self-Management Support :    Diabetes self-management support: Not documented    Hypertension self-management support: Not documented    Lipid self-management support: Not documented    Nursing Instructions: Give Pneumovax today    Appended Document: Pulmonary OV fax dr Rhetta Mura [facility]

## 2010-10-15 NOTE — Procedures (Signed)
Summary: [facility]iology [facility]    Allergies: 1)  ! Codeine 2)  ! Penicillin 3)  ! * Guafenisin  PPM Specifications Following MD:  Sherryl Manges, MD     PPM Vendor:  [employer]     PPM Model Number:  [id number]     PPM Serial Number:  [id number] PPM DOI:  09/12/2001     PPM Implanting MD:  Sherryl Manges, MD  Lead 1    Location: RA     DOI: 09/12/2001     Model #: [id number]     Serial #: [id number]     Status: active Lead 2    Location: RV     DOI: 09/12/2001     Model #: [id number]     Serial #: [id number]     Status: active  Magnet Response Rate:  BOL 85 ERI 65  Indications:  Syncope with LBBB   PPM Follow Up Battery Voltage:  2.68 V     Battery Est. Longevity:  11 mths     Pacer Dependent:  No       PPM Device Measurements [facility]rium  Amplitude: 2.00 mV, Impedance: 507 ohms, Threshold: 0.50 V at 0.40 msec Right Ventricle  Amplitude: 11.20 mV, Impedance: 420 ohms, Threshold: 0.750 V at 0.40 msec  Episodes MS Episodes:  488     Percent Mode Switch:  1.3%     Coumadin:  No Ventricular High Rate:  3     Atrial Pacing:  3.7%     Ventricular Pacing:  6.0%  Parameters Mode:  DDD     Lower Rate Limit:  60     Upper Rate Limit:  130 Paced AV Delay:  200     Sensed AV Delay:  140 Next Cardiology Appt Due:  12/14/2010 Tech Comments:  488 AHR EPISODES 1.3% OF TIME.  NORMAL DEVICE FUNCTION.  BATTERY LONGEVITY 11 MTHS.  CHANGED RA OUTPUT FROM 2.5 TO 2.0 V.  ROV IN APRIL  W/SK. Vella Kohler  May 27, 2010 11:31 AM

## 2010-10-15 NOTE — Assessment & Plan Note (Signed)
Summary: Acute NP office visit - COPD   Primary Provider/Referring Provider:  Dr. Waymon Amato  CC:  increased SOB, wheezing, and prod cough with whitish-yellow mucus x1week - denies f/c/s.  History of Present Illness: [id number] quit smoking in 1987  and chronic renal insufficiency with coronary artery disease.  The patient had history of congestive heart failure and diabetes.  The patient has been hospitalized on numerous occasions for respiratory failure and pneumonia.  Patient currently is residing in a nursing care facility.    September 10, 2009 3:11 PM Gout in right foot and still bothers the pt.  Had upset stomach and rx pills.  No diarrhea now.  Now: still bad cough and dyspnea is bad.  Had sore throat and ears itch.  Mucous is yellow and thick.  Feels bad all over.  October 27, 2009 3:41 PM Now with a chronic cough.  Not been in hosp since 2010.  Had gout in R foot.  Had diarrhea.  Now is over the diarrhea.  Cough is productive of white mucous to gray.  Notes more wheeze.  Dyspnea is not bad now only with exertion.   No real chest pain.    March 06, 2010 2:56 PM has been ill now for two months.  Pred was increased.  rx abx as well (zpak)  did not go to the hospital. Symptoms were cough,  more dyspneic.  Now:  still dyspneic, and white mucus now.  No real chestpain.  No real wheeze. Also c/o urinary pain,  ? if had UTI,  now no burning on urination.  April 29, 2010--Presents for an acute office visit. Complains of increased SOB, wheezing, prod cough with whitish-yellow mucus x1week. Cough is getting worse. Mucus is very thick at times. wheezing alot with coughing fits. Denies chest pain,  orthopnea, hemoptysis, fever, n/v/d, edema, headache.   Medications Prior to Update: 1)  Lantus 100 Unit/ml Soln (Insulin Glargine) .... 5 Untis At Bedtime 2)  Senokot S 8.6-50 Mg  Tabs (Sennosides-Docusate Sodium) .... One Tab At Bedtime 3)  Spiriva Handihaler 18 Mcg  Caps (Tiotropium Bromide Monohydrate)  .... Two Puffs in Handihaler Daily 4)  Novolog 100 Unit/ml Soln (Insulin Aspart) .... Sliding Scale With Breakfast, Lunch and Dinner and At Bedtime 5)  Albuterol Sulfate (2.5 Mg/70ml) 0.083% Nebu (Albuterol Sulfate) .... Administer 6ml Via Neb Four Times Daily 6)  Novolog 100 Unit/ml Soln (Insulin Aspart) .... 5 Units Three Times A Day Ac 7)  Aspir-Low 81 Mg Tbec (Aspirin) .... Once Daily 8)  Diltiazem Hcl Coated Beads 180 Mg Xr24h-Cap (Diltiazem Hcl Coated Beads) .... One Daily 9)  Ferrous Sulfate 325 (65 Fe) Mg Tabs (Ferrous Sulfate) .... Once Daily 10)  Colace 100 Mg Caps (Docusate Sodium) .... Two Times A Day 11)  Demadex 20 Mg Tabs (Torsemide) .... 2 Tabs Two Times A Day 12)  Pulmicort 0.25 Mg/31ml Susp (Budesonide) .... Four Times Daily 13)  Crestor 20 Mg Tabs (Rosuvastatin Calcium) .... Once Daily 14)  Protonix 40 Mg Tbec (Pantoprazole Sodium) .... Once Daily 15)  Allopurinol 100 Mg Tabs (Allopurinol) .... Two Times A Day 16)  Hypotears 1-1 % Soln (Polyethyl Glycol-Polyvinyl Alc) .... 2 Drops Each Eye 17)  Lovaza 1 Gm Caps (Omega-3-Acid Ethyl Esters) .... Take 1 Capsule By Mouth Two Times A Day 18)  Metolazone 2.5 Mg Tabs (Metolazone) .... Take One Tablet By Mouth Twice Weekly 19)  Klor-Con 10 10 Meq Cr-Tabs (Potassium Chloride) .... 2 Two Times A Day 20)  Ambien 10  Mg Tabs (Zolpidem Tartrate) .... At Bedtime As Needed 21)  Bactrim Ds 800-160 Mg Tabs (Sulfamethoxazole-Trimethoprim) .... One By Mouth Two Times A Day  Generic Ok  Current Medications (verified): 1)  Lantus 100 Unit/ml Soln (Insulin Glargine) .... 5 Untis At Bedtime 2)  Senokot S 8.6-50 Mg  Tabs (Sennosides-Docusate Sodium) .... One Tab At Bedtime 3)  Spiriva Handihaler 18 Mcg  Caps (Tiotropium Bromide Monohydrate) .... Two Puffs in Handihaler Daily 4)  Novolog 100 Unit/ml Soln (Insulin Aspart) .... Sliding Scale With Breakfast, Lunch and Dinner and At Bedtime 5)  Albuterol Sulfate (2.5 Mg/75ml) 0.083% Nebu (Albuterol  Sulfate) .... Administer 6ml Via Neb Four Times Daily 6)  Novolog 100 Unit/ml Soln (Insulin Aspart) .... 5 Units Three Times A Day Ac 7)  Aspir-Low 81 Mg Tbec (Aspirin) .... Once Daily 8)  Diltiazem Hcl Coated Beads 180 Mg Xr24h-Cap (Diltiazem Hcl Coated Beads) .... One Daily 9)  Ferrous Sulfate 325 (65 Fe) Mg Tabs (Ferrous Sulfate) .... Once Daily 10)  Colace 100 Mg Caps (Docusate Sodium) .... Two Times A Day 11)  Demadex 20 Mg Tabs (Torsemide) .... 2 Tabs Two Times A Day 12)  Pulmicort 0.25 Mg/73ml Susp (Budesonide) .... Four Times Daily 13)  Crestor 20 Mg Tabs (Rosuvastatin Calcium) .... Once Daily 14)  Protonix 40 Mg Tbec (Pantoprazole Sodium) .... Once Daily 15)  Allopurinol 100 Mg Tabs (Allopurinol) .... Two Times A Day 16)  Hypotears 1-1 % Soln (Polyethyl Glycol-Polyvinyl Alc) .... 2 Drops Each Eye 17)  Lovaza 1 Gm Caps (Omega-3-Acid Ethyl Esters) .... Take 1 Capsule By Mouth Two Times A Day 18)  Metolazone 2.5 Mg Tabs (Metolazone) .... Take One Tablet By Mouth Twice Weekly 19)  Klor-Con 10 10 Meq Cr-Tabs (Potassium Chloride) .... 2 Two Times A Day 20)  Ambien 10 Mg Tabs (Zolpidem Tartrate) .... At Bedtime As Needed 21)  Enulose 10 Gm/8ml Soln (Lactulose Encephalopathy) .... 30ml By Mouth Two Times A Day - Hold For Diarrhea 22)  Oxygen .... 2l/min Via Nasal Cannula Continuously 23)  Tramadol Hcl 50 Mg Tabs (Tramadol Hcl) .... Take 1 Tablet By Mouth Every 6 Hours 24)  Robaxin 500 Mg Tabs (Methocarbamol) .... 1/2 Tab As Needed Cramps 25)  Lorazepam 0.5 Mg Tabs (Lorazepam) .... 1/2 Tab Two Times A Day As Needed Anxiety 26)  Tylenol 325 Mg Tabs (Acetaminophen) .... 2 Every 6 Hours As Needed Pain or Fever 27)  Promethazine Hcl 12.5 Mg Tabs (Promethazine Hcl) .... Take 1 Tablet By Mouth Every 6 Hours As Needed Nausea 28)  Delsym 30 Mg/60ml Lqcr (Dextromethorphan Polistirex) .Marland Kitchen.. 10ml Every 12 Hours As Needed Cough 29)  Vicodin 5-500 Mg Tabs (Hydrocodone-Acetaminophen) .Marland Kitchen.. 1 Tab By Mouth  Every 4 Hours As Needed Pain  Allergies: 1)  ! Codeine 2)  ! Penicillin 3)  ! * Guafenisin  Past History:  Past Medical History: Last updated: 12/29/2009 Congestive heart failure - diastolic COPD History of C Diff colitis Diabetes mellitus, type II Hypertension Hyperlipidemia Anemia-NOS Coronary artery disease (Catheterization April 2006 demonstrated LAD 40% stenosis.  This proximal stent which was patent with 40-50% stenosis.  First diagonal had ostial 60% stenosis, second diagonal was normal.  The circumflex had luminal irregularities.  There was a ramus intermediate with ostial 60% stenosis, mid obtuse marginal had luminal irregularities, the right coronary artery is dominant with luminal irregularities.  The patient had PTCA of the LAD in 2002) Asystolic arrest  with pacemaker 2002 Renal insufficiency Pulmonary HTN Gallstones  Current Problems:  CORONARY  ARTERY DISEASE (ICD-414.00) DIASTOLIC HEART FAILURE, CHRONIC (ICD-428.32) PACEMAKER, PERMANENT; MDT KAPPA (ICD-V45.01) SINOATRIAL NODE DYSFUNCTION (ICD-427.81) SYNCOPE (ICD-780.2) PERCUTANEOUS TRANSLUMINAL CORONARY ANGIOPLASTY, HX OF (ICD-V45.82) HYPERLIPIDEMIA (ICD-272.4) HYPERTENSION (ICD-401.9) DYSLIPIDEMIA (ICD-272.4) CHRONIC OBSTRUCTIVE PULMONARY DISEASE, ACUTE EXACERBATION (ICD-491.21) COPD (ICD-496) ACUTE BRONCHITIS (ICD-466.0) CHOLELITHIASIS, HX OF (ICD-V12.79) RENAL INSUFFICIENCY (ICD-588.9) ANEMIA-NOS (ICD-285.9) OBESITY (ICD-278.00) * SACRAL ULCERS CLOSTRIDIUM DIFFICILE COLITIS, HX OF DIARRHEA (ICD-V12.79) OSTEOARTHRITIS (ICD-715.90) ANEMIA (ICD-285.9) ALLERGIC RHINITIS (ICD-477.9) STASIS DERMATITIS (ICD-454.1) GOUT (ICD-274.9) DIABETES MELLITUS, TYPE II (ICD-250.00)      Past Surgical History: Last updated: 11/30/2007 PERCUTANEOUS TRANSLUMINAL CORONARY ANGIOPLASTY, HX OF (ICD-V45.82) 2002  APPENDECTOMY, HX OF (ICD-V45.79) HYSTERECTOMY, HX OF (ICD-V45.77) PACEMAKER, PERMANENT (ICD-V45.01)  2002  Family History: Last updated: 10/12/2007 mother with DM, HTN father with heart disease  Social History: Last updated: 04/01/2009 lives at [facility] Former Smoker quit in 1987 Alcohol use-no 1 child widowed retired Hospital doctor  Risk Factors: Smoking Status: quit > 6 months (03/06/2010)  Review of Systems      See HPI  Vital Signs:  Patient profile:   77 year old female Height:      59 inches Weight:      169.13 pounds BMI:     34.28 O2 Sat:      97 % on 3 L/min pulsing Temp:     97.0 degrees F oral Pulse rate:   95 / minute BP sitting:   140 / 56  (left arm) Cuff size:   regular  Vitals Entered By: Boone Master CNA/MA (April 29, 2010 9:55 AM)  O2 Flow:  3 L/min pulsing CC: increased SOB, wheezing, prod cough with whitish-yellow mucus x1week - denies f/c/s Is Patient Diabetic? Yes Comments Medications reviewed with patient Daytime contact number verified with patient. Boone Master CNA/MA  April 29, 2010 9:55 AM    Physical Exam  Additional Exam:  Gen: Pleasant, well-nourished, in no distress , normal affect  rolling walkier,  edentulous,   ENT: no lesions, no post nasal drip Neck: No JVD, no TMG, no carotid bruits Lungs:Coarse BS w/ few exp wheezing.  Cardiovascular: RRR, heart sounds normal, no murmurs or gallops, trace edema.  Abdomen: soft and non-tender, no HSM, BS normal Musculoskeletal: No deformities, no cyanosis or clubbing Neuro: alert, non-focal     Impression & Recommendations:  Problem # 1:  CHRONIC OBSTRUCTIVE PULMONARY DISEASE, ACUTE EXACERBATION (ICD-491.21) Exacerbation REC; Xopenex neb in office  Avelox 400mg  once daily for 7days Mucinex DM two times a day as needed cough/congestion Prendisone taper over next week  Please contact office for sooner follow up if symptoms do not improve or worsen  follow up Dr. Delford Field as scheduled.  Orders: Nebulizer Tx ([id number]) Est. Patient Level IV ([id number])  Medications Added to  Medication List This Visit: 1)  Enulose 10 Gm/91ml Soln (Lactulose encephalopathy) .... 30ml by mouth two times a day - hold for diarrhea 2)  Oxygen  .... 2l/min via nasal cannula continuously 3)  Tramadol Hcl 50 Mg Tabs (Tramadol hcl) .... Take 1 tablet by mouth every 6 hours 4)  Robaxin 500 Mg Tabs (Methocarbamol) .... 1/2 tab as needed cramps 5)  Lorazepam 0.5 Mg Tabs (Lorazepam) .... 1/2 tab two times a day as needed anxiety 6)  Tylenol 325 Mg Tabs (Acetaminophen) .... 2 every 6 hours as needed pain or fever 7)  Promethazine Hcl 12.5 Mg Tabs (Promethazine hcl) .... Take 1 tablet by mouth every 6 hours as needed nausea 8)  Delsym 30 Mg/57ml Lqcr (Dextromethorphan polistirex) .Marland Kitchen.. 10ml every 12 hours  as needed cough 9)  Vicodin 5-500 Mg Tabs (Hydrocodone-acetaminophen) .Marland Kitchen.. 1 tab by mouth every 4 hours as needed pain 10)  Avelox 400 Mg Tabs (Moxifloxacin hcl) .Marland Kitchen.. 1 by mouth once daily 11)  Prednisone 10 Mg Tabs (Prednisone) .... 4 tabs for 2 days, then 3 tabs for 2 days, 2 tabs for 2 days, then 1 tab for 2 days, then stop  Complete Medication List: 1)  Lantus 100 Unit/ml Soln (Insulin glargine) .... 5 untis at bedtime 2)  Senokot S 8.6-50 Mg Tabs (Sennosides-docusate sodium) .... One tab at bedtime 3)  Spiriva Handihaler 18 Mcg Caps (Tiotropium bromide monohydrate) .... Two puffs in handihaler daily 4)  Novolog 100 Unit/ml Soln (Insulin aspart) .... Sliding scale with breakfast, lunch and dinner and at bedtime 5)  Albuterol Sulfate (2.5 Mg/7ml) 0.083% Nebu (Albuterol sulfate) .... Administer 6ml via neb four times daily 6)  Novolog 100 Unit/ml Soln (Insulin aspart) .... 5 units three times a day ac 7)  Aspir-low 81 Mg Tbec (Aspirin) .... Once daily 8)  Diltiazem Hcl Coated Beads 180 Mg Xr24h-cap (Diltiazem hcl coated beads) .... One daily 9)  Ferrous Sulfate 325 (65 Fe) Mg Tabs (Ferrous sulfate) .... Once daily 10)  Colace 100 Mg Caps (Docusate sodium) .... Two times a day 11)  Demadex 20  Mg Tabs (Torsemide) .... 2 tabs two times a day 12)  Pulmicort 0.25 Mg/65ml Susp (Budesonide) .... Four times daily 13)  Crestor 20 Mg Tabs (Rosuvastatin calcium) .... Once daily 14)  Protonix 40 Mg Tbec (Pantoprazole sodium) .... Once daily 15)  Allopurinol 100 Mg Tabs (Allopurinol) .... Two times a day 16)  Hypotears 1-1 % Soln (Polyethyl glycol-polyvinyl alc) .... 2 drops each eye 17)  Lovaza 1 Gm Caps (Omega-3-acid ethyl esters) .... Take 1 capsule by mouth two times a day 18)  Metolazone 2.5 Mg Tabs (Metolazone) .... Take one tablet by mouth twice weekly 19)  Klor-con 10 10 Meq Cr-tabs (Potassium chloride) .... 2 two times a day 20)  Ambien 10 Mg Tabs (Zolpidem tartrate) .... At bedtime as needed 21)  Enulose 10 Gm/51ml Soln (Lactulose encephalopathy) .... 30ml by mouth two times a day - hold for diarrhea 22)  Oxygen  .... 2l/min via nasal cannula continuously 23)  Tramadol Hcl 50 Mg Tabs (Tramadol hcl) .... Take 1 tablet by mouth every 6 hours 24)  Robaxin 500 Mg Tabs (Methocarbamol) .... 1/2 tab as needed cramps 25)  Lorazepam 0.5 Mg Tabs (Lorazepam) .... 1/2 tab two times a day as needed anxiety 26)  Tylenol 325 Mg Tabs (Acetaminophen) .... 2 every 6 hours as needed pain or fever 27)  Promethazine Hcl 12.5 Mg Tabs (Promethazine hcl) .... Take 1 tablet by mouth every 6 hours as needed nausea 28)  Delsym 30 Mg/51ml Lqcr (Dextromethorphan polistirex) .Marland Kitchen.. 10ml every 12 hours as needed cough 29)  Vicodin 5-500 Mg Tabs (Hydrocodone-acetaminophen) .Marland Kitchen.. 1 tab by mouth every 4 hours as needed pain 30)  Avelox 400 Mg Tabs (Moxifloxacin hcl) .Marland Kitchen.. 1 by mouth once daily 31)  Prednisone 10 Mg Tabs (Prednisone) .... 4 tabs for 2 days, then 3 tabs for 2 days, 2 tabs for 2 days, then 1 tab for 2 days, then stop  Patient Instructions: 1)  Avelox 400mg  once daily for 7days 2)  Mucinex DM two times a day as needed cough/congestion 3)  Prendisone taper over next week  4)  Please contact office for sooner  follow up if symptoms do not improve or worsen  5)  follow up Dr. Delford Field as scheduled.  Prescriptions: PREDNISONE 10 MG TABS (PREDNISONE) 4 tabs for 2 days, then 3 tabs for 2 days, 2 tabs for 2 days, then 1 tab for 2 days, then stop  #20 x 0   Entered and Authorized by:   Rubye Oaks NP   Signed by:   Jo Booze NP on 04/29/2010   Method used:   Print then Give to Patient   RxID:   [id number] AVELOX 400 MG TABS (MOXIFLOXACIN HCL) 1 by mouth once daily  #7 x 0   Entered and Authorized by:   Rubye Oaks NP   Signed by:   Ingri Diemer NP on 04/29/2010   Method used:   Print then Give to Patient   RxID:   [id number]

## 2010-10-15 NOTE — Progress Notes (Signed)
Summary: NEED NOTE  Phone Note Call from Patient   Caller: Significant Other ( FRIEND DORCAS) Call For: WRIGHT Summary of Call: PT NEED NOTE STATING THAT SHE DOES NOT NEED SKILLED NURSING. Initial call taken by: Rickard Patience,  May 20, 2010 4:41 PM  Follow-up for Phone Call        Pt friend is in [place] and is stating pt is requesting a letter stating that she does not need to go to a long term skilled nursing facility. Pt wants to return to assisted living, but there are orders for her to go to skilled nursing facility. Pt was discharged from hospital yesterday. She states she discussed this matter with PW while she was inthe hospital.  TD spoke with PW and he advised that pt does not need skilled nursing and can return to assisted living. Note has been printed and given to pt friend. Carron Curie CMA  May 20, 2010 5:01 PM

## 2010-10-15 NOTE — Assessment & Plan Note (Signed)
Summary: Pulmonary OV   Primary Provider/Referring Provider:  Minus Breeding MD  CC:  Pt here for follow up. Pt c/o productive cough with small amounts of white to gray mucus approx 2 weeks.  History of Present Illness: 77-yowf quit smoking in 1987  and chronic renal insufficiency with coronary artery disease.  The patient had recurrent history of congestive heart failure and diabetes.  The patient has been hospitalized on numerous occasions for respiratory failure and pneumonia.  Patient currently is residing in a nursing care facility.     September 10, 2009 3:11 PM Gout in right foot and still bothers the pt.  Had upset stomach and rx pills.  No diarrhea now.  Now: still bad cough and dyspnea is bad.  Had sore throat and ears itch.  Mucous is yellow and thick.  Feels bad all over.  October 27, 2009 3:41 PM Now with a chronic cough.  Not been in hosp since 2010.  Had gout in R foot.  Had diarrhea.  Now is over the diarrhea.  Cough is productive of white mucous to gray.  Notes more wheeze.  Dyspnea is not bad now only with exertion.   No real chest pain.     Preventive Screening-Counseling & Management  Alcohol-Tobacco     Smoking Status: quit > 6 months  Current Medications (verified): 1)  Lantus 100 Unit/ml Soln (Insulin Glargine) .... 5 Untis At Bedtime 2)  Promethazine Hcl 12.5 Mg  Tabs (Promethazine Hcl) .Marland Kitchen.. 1 By Mouth Q 6 Hrs Prn 3)  Klor-Con M20 20 Meq  Tbcr (Potassium Chloride Crys Cr) .... Take 1 Tablet By Mouth Two Times A Day 4)  Senokot S 8.6-50 Mg  Tabs (Sennosides-Docusate Sodium) .... One Tab At Bedtime 5)  Oxygen .... 2l Continuous 6)  Spiriva Handihaler 18 Mcg  Caps (Tiotropium Bromide Monohydrate) .... Two Puffs in Handihaler Daily 7)  Novolog 100 Unit/ml Soln (Insulin Aspart) .... Sliding Scale With Breakfast, Lunch and Dinner 8)  Lorazepam 0.5 Mg Tabs (Lorazepam) .... Take 1/2  Tablet By Mouth Once A Day 9)  Tylenol 325 Mg Tabs (Acetaminophen) .... 2 By Mouth  Every 6 Hours As Needed 10)  Albuterol Sulfate (2.5 Mg/28ml) 0.083% Nebu (Albuterol Sulfate) .... Administer 6ml Via Neb Four Times Daily 11)  Novolog 100 Unit/ml Soln (Insulin Aspart) .... 5 Units Three Times A Day Ac 12)  Aspir-Low 81 Mg Tbec (Aspirin) .... Once Daily 13)  Cardizem 120 Mg Tabs (Diltiazem Hcl) .... Once Daily 14)  Ferrous Sulfate 325 (65 Fe) Mg Tabs (Ferrous Sulfate) .... Once Daily 15)  Colace 100 Mg Caps (Docusate Sodium) .... Two Times A Day 16)  Demadex 20 Mg Tabs (Torsemide) .... 2 Tabs Two Times A Day 17)  Pulmicort 0.25 Mg/8ml Susp (Budesonide) .... Four Times Daily 18)  Crestor 20 Mg Tabs (Rosuvastatin Calcium) .... Once Daily 19)  Protonix 40 Mg Tbec (Pantoprazole Sodium) .... Once Daily 20)  Tussionex Pennkinetic Er 8-10 Mg/53ml Lqcr (Chlorpheniramine-Hydrocodone) .... 5cc Q12h As Needed For Cough 21)  Duoneb 0.5-2.5 (3) Mg/66ml Soln (Ipratropium-Albuterol) .... Every 6 Hours X72 Hours 22)  Allopurinol 100 Mg Tabs (Allopurinol) .... Two Times A Day 23)  Robaxin 500 Mg Tabs (Methocarbamol) .... 1/2 Tab Once Daily As Needed 24)  Ultram 50 Mg Tabs (Tramadol Hcl) .... Take 1 Tablet By Mouth Every 6 Hours As Needed 25)  Hypotears 1-1 % Soln (Polyethyl Glycol-Polyvinyl Alc) .... 2 Drops Each Eye 26)  Lovaza 1 Gm Caps (Omega-3-Acid Ethyl Esters) .Marland KitchenMarland KitchenMarland Kitchen  Take 1 Capsule By Mouth Two Times A Day  Allergies (verified): 1)  ! Codeine 2)  ! Penicillin  Past History:  Past medical, surgical, family and social histories (including risk factors) reviewed, and no changes noted (except as noted below).  Past Medical History: Reviewed history from 04/01/2009 and no changes required. Congestive heart failure - diastolic COPD History of C Diff colitis Diabetes mellitus, type II Hypertension Hyperlipidemia Anemia-NOS Coronary artery disease (Catheterization April 2006 demonstrated LAD 40% stenosis.  This proximal stent which was patent with 40-50% stenosis.  First diagonal had  ostial 60% stenosis, second diagonal was normal.  The circumflex had luminal irregularities.  There was a ramus intermediate with ostial 60% stenosis, mid obtuse marginal had luminal irregularities, the right coronary artery is dominant with luminal irregularities.  The patient had PTCA of the LAD in 2002) Asystolic arrest  with pacemaker 2002 Renal insufficiency Pulmonary HTN Gallstones    Past Surgical History: Reviewed history from 11/30/2007 and no changes required. PERCUTANEOUS TRANSLUMINAL CORONARY ANGIOPLASTY, HX OF (ICD-V45.82) 2002  APPENDECTOMY, HX OF (ICD-V45.79) HYSTERECTOMY, HX OF (ICD-V45.77) PACEMAKER, PERMANENT (ICD-V45.01) 2002  Family History: Reviewed history from 10/12/2007 and no changes required. mother with DM, HTN father with heart disease  Social History: Reviewed history from 04/01/2009 and no changes required. lives at [facility] Former Smoker quit in 1987 Alcohol use-no 1 child widowed retired Hospital doctor  Review of Systems       The patient complains of shortness of breath with activity, productive cough, non-productive cough, chest pain, and change in color of mucus.  The patient denies shortness of breath at rest, coughing up blood, irregular heartbeats, acid heartburn, indigestion, loss of appetite, weight change, abdominal pain, difficulty swallowing, sore throat, tooth/dental problems, headaches, nasal congestion/difficulty breathing through nose, sneezing, itching, ear ache, anxiety, depression, hand/feet swelling, joint stiffness or pain, rash, and fever.    Vital Signs:  Patient profile:   77 year old female Height:      59 inches Weight:      186 pounds O2 Sat:      100 % on 3 L/min Temp:     98.6 degrees F oral Pulse rate:   99 / minute BP sitting:   114 / 78  (right arm) Cuff size:   regular  Vitals Entered By: Zackery Barefoot CMA (October 27, 2009 3:24 PM)  O2 Flow:  3 L/min CC: Pt here for follow up. Pt c/o productive  cough with small amounts of white to gray mucus approx 2 weeks Comments Medications reviewed with patient Verified pt's contact number Zackery Barefoot CMA  October 27, 2009 3:25 PM    Physical Exam  Additional Exam:  Gen: Pleasant, well-nourished, in no distress , normal affect  rolling walkier,  edentulous,   ENT: no lesions, no post nasal drip Neck: No JVD, no TMG, no carotid bruits Lungs: No use of accessory muscles, no dullness to percussion, distant BS, scattered rhonchi Cardiovascular: RRR, heart sounds normal, no murmurs or gallops, trace edema.  Abdomen: soft and non-tender, no HSM, BS normal Musculoskeletal: No deformities, no cyanosis or clubbing Neuro: alert, non-focal     Impression & Recommendations:  Problem # 1:  ACUTE BRONCHITIS (ICD-466.0) Assessment Deteriorated acute bronchitis with copd flare plan pulse prednisone flagyl plus avelox (wiht hx of abx assoc diarrhea) The following medications were removed from the medication list:    Flagyl 500 Mg Tabs (Metronidazole) .Marland Kitchen... Three times a day x 7 days Her updated medication list for  this problem includes:    Spiriva Handihaler 18 Mcg Caps (Tiotropium bromide monohydrate) .Marland Kitchen..Marland Kitchen Two puffs in handihaler daily    Albuterol Sulfate (2.5 Mg/83ml) 0.083% Nebu (Albuterol sulfate) .Marland Kitchen... Administer 6ml via neb four times daily    Pulmicort 0.25 Mg/34ml Susp (Budesonide) .Marland Kitchen... Four times daily    Tussionex Pennkinetic Er 8-10 Mg/11ml Lqcr (Chlorpheniramine-hydrocodone) .Marland Kitchen... 5cc q12h as needed for cough    Duoneb 0.5-2.5 (3) Mg/63ml Soln (Ipratropium-albuterol) ..... Every 6 hours x72 hours    Avelox 400 Mg Tabs (Moxifloxacin hcl) ..... By mouth daily    Flagyl 250 Mg Tabs (Metronidazole) ..... One by mouth tid  Orders: Est. Patient Level IV (04540)  Medications Added to Medication List This Visit: 1)  Lorazepam 0.5 Mg Tabs (Lorazepam) .... Take 1/2  tablet by mouth once a day 2)  Ultram 50 Mg Tabs (Tramadol hcl) ....  Take 1 tablet by mouth every 6 hours as needed 3)  Hypotears 1-1 % Soln (Polyethyl glycol-polyvinyl alc) .... 2 drops each eye 4)  Lovaza 1 Gm Caps (Omega-3-acid ethyl esters) .... Take 1 capsule by mouth two times a day 5)  Avelox 400 Mg Tabs (Moxifloxacin hcl) .... By mouth daily 6)  Flagyl 250 Mg Tabs (Metronidazole) .... One by mouth tid 7)  Prednisone 10 Mg Tabs (Prednisone) .... Take as directed 4 each am x3days, 3 x 3days, 2 x 3days, 1 x 3days then stop  Complete Medication List: 1)  Lantus 100 Unit/ml Soln (Insulin glargine) .... 5 untis at bedtime 2)  Promethazine Hcl 12.5 Mg Tabs (Promethazine hcl) .Marland Kitchen.. 1 by mouth q 6 hrs prn 3)  Klor-con M20 20 Meq Tbcr (Potassium chloride crys cr) .... Take 1 tablet by mouth two times a day 4)  Senokot S 8.6-50 Mg Tabs (Sennosides-docusate sodium) .... One tab at bedtime 5)  Oxygen  .... 2l continuous 6)  Spiriva Handihaler 18 Mcg Caps (Tiotropium bromide monohydrate) .... Two puffs in handihaler daily 7)  Novolog 100 Unit/ml Soln (Insulin aspart) .... Sliding scale with breakfast, lunch and dinner 8)  Lorazepam 0.5 Mg Tabs (Lorazepam) .... Take 1/2  tablet by mouth once a day 9)  Tylenol 325 Mg Tabs (Acetaminophen) .... 2 by mouth every 6 hours as needed 10)  Albuterol Sulfate (2.5 Mg/30ml) 0.083% Nebu (Albuterol sulfate) .... Administer 6ml via neb four times daily 11)  Novolog 100 Unit/ml Soln (Insulin aspart) .... 5 units three times a day ac 12)  Aspir-low 81 Mg Tbec (Aspirin) .... Once daily 13)  Cardizem 120 Mg Tabs (Diltiazem hcl) .... Once daily 14)  Ferrous Sulfate 325 (65 Fe) Mg Tabs (Ferrous sulfate) .... Once daily 15)  Colace 100 Mg Caps (Docusate sodium) .... Two times a day 16)  Demadex 20 Mg Tabs (Torsemide) .... 2 tabs two times a day 17)  Pulmicort 0.25 Mg/20ml Susp (Budesonide) .... Four times daily 18)  Crestor 20 Mg Tabs (Rosuvastatin calcium) .... Once daily 19)  Protonix 40 Mg Tbec (Pantoprazole sodium) .... Once daily 20)   Tussionex Pennkinetic Er 8-10 Mg/39ml Lqcr (Chlorpheniramine-hydrocodone) .... 5cc q12h as needed for cough 21)  Duoneb 0.5-2.5 (3) Mg/73ml Soln (Ipratropium-albuterol) .... Every 6 hours x72 hours 22)  Allopurinol 100 Mg Tabs (Allopurinol) .... Two times a day 23)  Robaxin 500 Mg Tabs (Methocarbamol) .... 1/2 tab once daily as needed 24)  Ultram 50 Mg Tabs (Tramadol hcl) .... Take 1 tablet by mouth every 6 hours as needed 25)  Hypotears 1-1 % Soln (Polyethyl glycol-polyvinyl alc) .Marland KitchenMarland KitchenMarland Kitchen  2 drops each eye 26)  Lovaza 1 Gm Caps (Omega-3-acid ethyl esters) .... Take 1 capsule by mouth two times a day 27)  Avelox 400 Mg Tabs (Moxifloxacin hcl) .... By mouth daily 28)  Flagyl 250 Mg Tabs (Metronidazole) .... One by mouth tid 29)  Prednisone 10 Mg Tabs (Prednisone) .... Take as directed 4 each am x3days, 3 x 3days, 2 x 3days, 1 x 3days then stop  Patient Instructions: 1)  Avelox one daily for 5 days 2)  Pulse prednisone 3)  Flagyl one three times a day for 7days 4)  No other medication changes 5)  Return one month [facility] office  Prescriptions: PREDNISONE 10 MG  TABS (PREDNISONE) Take as directed 4 each am x3days, 3 x 3days, 2 x 3days, 1 x 3days then stop  #30 x 0   Entered and Authorized by:   Storm Frisk MD   Signed by:   Storm Frisk MD on 10/27/2009   Method used:   Print then Give to Patient   RxID:   [id number] FLAGYL 250 MG TABS (METRONIDAZOLE) one by mouth tid  #21 x 0   Entered and Authorized by:   Storm Frisk MD   Signed by:   Storm Frisk MD on 10/27/2009   Method used:   Print then Give to Patient   RxID:   [id number] AVELOX 400 MG  TABS (MOXIFLOXACIN HCL) By mouth daily  #5 x 0   Entered and Authorized by:   Storm Frisk MD   Signed by:   Storm Frisk MD on 10/27/2009   Method used:   Print then Give to Patient   RxID:   [id number]

## 2010-10-15 NOTE — Assessment & Plan Note (Signed)
Summary: Pulmonary OV   Primary Provider/Referring Provider:  Dr. Waymon Amato  CC:  4 month follow up.  Pt states she can only walk short distances and still has trouble breathing. onset x1 month.  States she coughs "a little"-prod with thick and white mucus.  Denies wheezing and chest tightness.  Marland Kitchen  History of Present Illness: 77-yowf quit smoking in 1987  and chronic renal insufficiency with coronary artery disease.  The patient had recurrent history of congestive heart failure and diabetes.  The patient has been hospitalized on numerous occasions for respiratory failure and pneumonia.  Patient currently is residing in a nursing care facility.     September 10, 2009 3:11 PM Gout in right foot and still bothers the pt.  Had upset stomach and rx pills.  No diarrhea now.  Now: still bad cough and dyspnea is bad.  Had sore throat and ears itch.  Mucous is yellow and thick.  Feels bad all over.  October 27, 2009 3:41 PM Now with a chronic cough.  Not been in hosp since 2010.  Had gout in R foot.  Had diarrhea.  Now is over the diarrhea.  Cough is productive of white mucous to gray.  Notes more wheeze.  Dyspnea is not bad now only with exertion.   No real chest pain.    March 06, 2010 2:56 PM has been ill now for two months.  Pred was increased.  rx abx as well (zpak)  did not go to the hospital. Symptoms were cough,  more dyspneic.  Now:  still dyspneic, and white mucus now.  No real chestpain.  No real wheeze. Also c/o urinary pain,  ? if had UTI,  now no burning on urination.   Preventive Screening-Counseling & Management  Alcohol-Tobacco     Smoking Status: quit > 6 months  Current Medications (verified): 1)  Lantus 100 Unit/ml Soln (Insulin Glargine) .... 5 Untis At Bedtime 2)  Senokot S 8.6-50 Mg  Tabs (Sennosides-Docusate Sodium) .... One Tab At Bedtime 3)  Oxygen .... 2l Continuous 4)  Spiriva Handihaler 18 Mcg  Caps (Tiotropium Bromide Monohydrate) .... Two Puffs in Handihaler  Daily 5)  Novolog 100 Unit/ml Soln (Insulin Aspart) .... Sliding Scale With Breakfast, Lunch and Dinner and At Bedtime 6)  Albuterol Sulfate (2.5 Mg/35ml) 0.083% Nebu (Albuterol Sulfate) .... Administer 6ml Via Neb Four Times Daily 7)  Novolog 100 Unit/ml Soln (Insulin Aspart) .... 5 Units Three Times A Day Ac 8)  Aspir-Low 81 Mg Tbec (Aspirin) .... Once Daily 9)  Diltiazem Hcl Coated Beads 180 Mg Xr24h-Cap (Diltiazem Hcl Coated Beads) .... One Daily 10)  Ferrous Sulfate 325 (65 Fe) Mg Tabs (Ferrous Sulfate) .... Once Daily 11)  Colace 100 Mg Caps (Docusate Sodium) .... Two Times A Day 12)  Demadex 20 Mg Tabs (Torsemide) .... 2 Tabs Two Times A Day 13)  Pulmicort 0.25 Mg/81ml Susp (Budesonide) .... Four Times Daily 14)  Crestor 20 Mg Tabs (Rosuvastatin Calcium) .... Once Daily 15)  Protonix 40 Mg Tbec (Pantoprazole Sodium) .... Once Daily 16)  Allopurinol 100 Mg Tabs (Allopurinol) .... Two Times A Day 17)  Hypotears 1-1 % Soln (Polyethyl Glycol-Polyvinyl Alc) .... 2 Drops Each Eye 18)  Lovaza 1 Gm Caps (Omega-3-Acid Ethyl Esters) .... Take 1 Capsule By Mouth Two Times A Day 19)  Metolazone 2.5 Mg Tabs (Metolazone) .... Take One Tablet By Mouth Twice Weekly 20)  Klor-Con 10 10 Meq Cr-Tabs (Potassium Chloride) .... 2 Two Times A Day 21)  Ambien 10 Mg Tabs (Zolpidem Tartrate) .... At Bedtime As Needed  Allergies (verified): 1)  ! Codeine 2)  ! Penicillin  Past History:  Past medical, surgical, family and social histories (including risk factors) reviewed, and no changes noted (except as noted below).  Past Medical History: Reviewed history from 12/29/2009 and no changes required. Congestive heart failure - diastolic COPD History of C Diff colitis Diabetes mellitus, type II Hypertension Hyperlipidemia Anemia-NOS Coronary artery disease (Catheterization April 2006 demonstrated LAD 40% stenosis.  This proximal stent which was patent with 40-50% stenosis.  First diagonal had ostial 60%  stenosis, second diagonal was normal.  The circumflex had luminal irregularities.  There was a ramus intermediate with ostial 60% stenosis, mid obtuse marginal had luminal irregularities, the right coronary artery is dominant with luminal irregularities.  The patient had PTCA of the LAD in 2002) Asystolic arrest  with pacemaker 2002 Renal insufficiency Pulmonary HTN Gallstones  Current Problems:  CORONARY ARTERY DISEASE (ICD-414.00) DIASTOLIC HEART FAILURE, CHRONIC (ICD-428.32) PACEMAKER, PERMANENT; MDT KAPPA (ICD-V45.01) SINOATRIAL NODE DYSFUNCTION (ICD-427.81) SYNCOPE (ICD-780.2) PERCUTANEOUS TRANSLUMINAL CORONARY ANGIOPLASTY, HX OF (ICD-V45.82) HYPERLIPIDEMIA (ICD-272.4) HYPERTENSION (ICD-401.9) DYSLIPIDEMIA (ICD-272.4) CHRONIC OBSTRUCTIVE PULMONARY DISEASE, ACUTE EXACERBATION (ICD-491.21) COPD (ICD-496) ACUTE BRONCHITIS (ICD-466.0) CHOLELITHIASIS, HX OF (ICD-V12.79) RENAL INSUFFICIENCY (ICD-588.9) ANEMIA-NOS (ICD-285.9) OBESITY (ICD-278.00) * SACRAL ULCERS CLOSTRIDIUM DIFFICILE COLITIS, HX OF DIARRHEA (ICD-V12.79) OSTEOARTHRITIS (ICD-715.90) ANEMIA (ICD-285.9) ALLERGIC RHINITIS (ICD-477.9) STASIS DERMATITIS (ICD-454.1) GOUT (ICD-274.9) DIABETES MELLITUS, TYPE II (ICD-250.00)      Past Surgical History: Reviewed history from 11/30/2007 and no changes required. PERCUTANEOUS TRANSLUMINAL CORONARY ANGIOPLASTY, HX OF (ICD-V45.82) 2002  APPENDECTOMY, HX OF (ICD-V45.79) HYSTERECTOMY, HX OF (ICD-V45.77) PACEMAKER, PERMANENT (ICD-V45.01) 2002  Family History: Reviewed history from 10/12/2007 and no changes required. mother with DM, HTN father with heart disease  Social History: Reviewed history from 04/01/2009 and no changes required. lives at [facility] Former Smoker quit in 1987 Alcohol use-no 1 child widowed retired Hospital doctor  Review of Systems       The patient complains of shortness of breath with activity, shortness of breath at rest, productive  cough, and non-productive cough.  The patient denies coughing up blood, chest pain, irregular heartbeats, acid heartburn, indigestion, loss of appetite, weight change, abdominal pain, difficulty swallowing, sore throat, tooth/dental problems, headaches, nasal congestion/difficulty breathing through nose, sneezing, itching, ear ache, anxiety, depression, hand/feet swelling, joint stiffness or pain, rash, change in color of mucus, and fever.         dysuria  Vital Signs:  Patient profile:   77 year old female Height:      59 inches Weight:      173 pounds BMI:     35.07 O2 Sat:      99 % on 3 L/minpulsed Temp:     99.3 degrees F oral Pulse rate:   86 / minute BP sitting:   128 / 68  (right arm) Cuff size:   regular  Vitals Entered By: Gweneth Dimitri RN (March 06, 2010 2:54 PM)  O2 Flow:  3 L/minpulsed \ CC: 4 month follow up.  Pt states she can only walk short distances and still has trouble breathing. onset x1 month.  States she coughs "a little"-prod with thick, white mucus.  Denies wheezing and chest tightness.   Comments Medications reviewed with patient Daytime contact number verified with patient. Gweneth Dimitri RN  March 06, 2010 2:54 PM    Physical Exam  Additional Exam:  Gen: Pleasant, well-nourished, in no distress , normal affect  rolling walkier,  edentulous,   ENT: no lesions, no post nasal drip Neck: No JVD, no TMG, no carotid bruits Lungs: No use of accessory muscles, no dullness to percussion, distant BS, scattered rhonchi Cardiovascular: RRR, heart sounds normal, no murmurs or gallops, trace edema.  Abdomen: soft and non-tender, no HSM, BS normal Musculoskeletal: No deformities, no cyanosis or clubbing Neuro: alert, non-focal   White Cell Count          8.6 K/uL                    4.5-10.5   Red Cell Count       [L]  3.83 Mil/uL                 3.87-5.11   Hemoglobin           [L]  10.6 g/dL                   13.2-44.0   Hematocrit           [L]  32.2 %                       36.0-46.0   MCV                       84.0 fl                     78.0-100.0   MCHC                      33.1 g/dL                   10.2-72.5   RDW                  [H]  16.8 %                      11.5-14.6   Platelet Count            233.0 K/uL                  150.0-400.0   Neutrophil %              64.0 %                      43.0-77.0   Lymphocyte %              23.5 %                      12.0-46.0   Monocyte %                7.7 %                       3.0-12.0   Eosinophils%              4.4 %                       0.0-5.0   Basophils %               0.4 %                       0.0-3.0   Neutrophill Absolute      5.5  K/uL                    1.4-7.7   Lymphocyte Absolute       2.0 K/uL                    0.7-4.0   Monocyte Absolute         0.7 K/uL                    0.1-1.0  Eosinophils, Absolute                             0.4 K/uL                    0.0-0.7   Basophils Absolute        0.0 K/uL                    0.0-0.1  Tests: (2) UDip w/Micro (URINE)   Color                     LT. YELLOW       RANGE:  Yellow;Lt. Yellow   Clarity                   CLEAR                       Clear   Specific Gravity          1.010                       1.000 - 1.030   Urine Ph                  5.5                         5.0-8.0   Protein                   NEGATIVE                    Negative   Urine Glucose             NEGATIVE                    Negative   Ketones                   NEGATIVE                    Negative   Urine Bilirubin           NEGATIVE                    Negative   Blood                     NEGATIVE                    Negative   Urobilinogen              0.2                         0.0 - 1.0   Leukocyte Esterace  NEGATIVE                    Negative   Nitrite                   NEGATIVE                    Negative   Urine WBC                 0-2/hpf                     0-2/hpf   Urine Bacteria            Few(10-50/hpf)              None   Impression  & Recommendations:  Problem # 1:  DYSURIA (ICD-788.1) Assessment Unchanged dysuria with normal WBC and normal UA plan  observation Her updated medication list for this problem includes:    Bactrim Ds 800-160 Mg Tabs (Sulfamethoxazole-trimethoprim) ..... One by mouth two times a day  generic ok  Orders: Est. Patient Level IV (04540) TLB-CBC Platelet - w/Differential (85025-CBCD)  Problem # 2:  ACUTE BRONCHITIS (ICD-466.0) Assessment: Deteriorated acute tracheobronchitis with flare of copd plan bactrim DS bid  x 5 days pulse pred  The following medications were removed from the medication list:    Tussionex Pennkinetic Er 8-10 Mg/12ml Lqcr (Chlorpheniramine-hydrocodone) .Marland Kitchen... 5cc q12h as needed for cough    Duoneb 0.5-2.5 (3) Mg/71ml Soln (Ipratropium-albuterol) ..... Every 6 hours x72 hours Her updated medication list for this problem includes:    Spiriva Handihaler 18 Mcg Caps (Tiotropium bromide monohydrate) .Marland Kitchen..Marland Kitchen Two puffs in handihaler daily    Albuterol Sulfate (2.5 Mg/78ml) 0.083% Nebu (Albuterol sulfate) .Marland Kitchen... Administer 6ml via neb four times daily    Pulmicort 0.25 Mg/56ml Susp (Budesonide) .Marland Kitchen... Four times daily    Bactrim Ds 800-160 Mg Tabs (Sulfamethoxazole-trimethoprim) ..... One by mouth two times a day  generic ok  Orders: Est. Patient Level IV (98119) T-Culture, Urine (14782-95621) TLB-CBC Platelet - w/Differential (85025-CBCD) TLB-Udip w/ Micro (81001-URINE)  Medications Added to Medication List This Visit: 1)  Novolog 100 Unit/ml Soln (Insulin aspart) .... Sliding scale with breakfast, lunch and dinner and at bedtime 2)  Klor-con 10 10 Meq Cr-tabs (Potassium chloride) .... 2 two times a day 3)  Ambien 10 Mg Tabs (Zolpidem tartrate) .... At bedtime as needed 4)  Bactrim Ds 800-160 Mg Tabs (Sulfamethoxazole-trimethoprim) .... One by mouth two times a day  generic ok 5)  Prednisone 10 Mg Tabs (Prednisone) .... Take as directed 4 each am x3days, 3 x 3days, 2 x 3days,  1 x 3days then stop  Complete Medication List: 1)  Lantus 100 Unit/ml Soln (Insulin glargine) .... 5 untis at bedtime 2)  Senokot S 8.6-50 Mg Tabs (Sennosides-docusate sodium) .... One tab at bedtime 3)  Spiriva Handihaler 18 Mcg Caps (Tiotropium bromide monohydrate) .... Two puffs in handihaler daily 4)  Novolog 100 Unit/ml Soln (Insulin aspart) .... Sliding scale with breakfast, lunch and dinner and at bedtime 5)  Albuterol Sulfate (2.5 Mg/5ml) 0.083% Nebu (Albuterol sulfate) .... Administer 6ml via neb four times daily 6)  Novolog 100 Unit/ml Soln (Insulin aspart) .... 5 units three times a day ac 7)  Aspir-low 81 Mg Tbec (Aspirin) .... Once daily 8)  Diltiazem Hcl Coated Beads 180 Mg Xr24h-cap (Diltiazem hcl coated beads) .... One daily 9)  Ferrous Sulfate 325 (65 Fe) Mg Tabs (Ferrous  sulfate) .... Once daily 10)  Colace 100 Mg Caps (Docusate sodium) .... Two times a day 11)  Demadex 20 Mg Tabs (Torsemide) .... 2 tabs two times a day 12)  Pulmicort 0.25 Mg/22ml Susp (Budesonide) .... Four times daily 13)  Crestor 20 Mg Tabs (Rosuvastatin calcium) .... Once daily 14)  Protonix 40 Mg Tbec (Pantoprazole sodium) .... Once daily 15)  Allopurinol 100 Mg Tabs (Allopurinol) .... Two times a day 16)  Hypotears 1-1 % Soln (Polyethyl glycol-polyvinyl alc) .... 2 drops each eye 17)  Lovaza 1 Gm Caps (Omega-3-acid ethyl esters) .... Take 1 capsule by mouth two times a day 18)  Metolazone 2.5 Mg Tabs (Metolazone) .... Take one tablet by mouth twice weekly 19)  Klor-con 10 10 Meq Cr-tabs (Potassium chloride) .... 2 two times a day 20)  Ambien 10 Mg Tabs (Zolpidem tartrate) .... At bedtime as needed 21)  Bactrim Ds 800-160 Mg Tabs (Sulfamethoxazole-trimethoprim) .... One by mouth two times a day  generic ok 22)  Prednisone 10 Mg Tabs (Prednisone) .... Take as directed 4 each am x3days, 3 x 3days, 2 x 3days, 1 x 3days then stop  Patient Instructions: 1)  A urine culture will be obtained with CBC lab  draw, we will call with results 2)  Start Bactrim one twice daily 3)  Pulse prednisone 10mg  4 each am x3days, 3 x 3days, 2 x 3days, 1 x 3days then stop 4)  No other medication changes 5)  Return 3 months Prescriptions: PREDNISONE 10 MG  TABS (PREDNISONE) Take as directed 4 each am x3days, 3 x 3days, 2 x 3days, 1 x 3days then stop  #30 x 0   Entered and Authorized by:   Storm Frisk MD   Signed by:   Storm Frisk MD on 03/06/2010   Method used:   Print then Give to Patient   RxID:   [id number] BACTRIM DS 800-160 MG TABS (SULFAMETHOXAZOLE-TRIMETHOPRIM) One by mouth two times a day  generic ok  #20 x 0   Entered and Authorized by:   Storm Frisk MD   Signed by:   Storm Frisk MD on 03/06/2010   Method used:   Print then Give to Patient   RxID:   [id number]

## 2010-10-15 NOTE — Letter (Signed)
Summary: [facility]  [address]   [place], [place] [address]   Phone: [phone]  Fax: [phone]    05/20/2010  [name] [address] [place], [place]  [address]  To Whom it May Concern,    Patient does not need skilled nursing. Patient is ok to return to assisted living.       Sincerely,   Shan Levans, MD

## 2010-10-15 NOTE — Progress Notes (Signed)
Summary: increased SOB-appt 08-12-10 at 1:30pm  Phone Note Call from Patient Call back at [phone]   Caller: Patient Call For: wright Summary of Call: Pt nos for appt yesterday, states she knew about appt but transportation where she lives wouldn't bring her, they claimed to have no knowledge of her appt, c/o sob x1-2 weeks, "just doesn't feel well" would like to be seen asap, pls advise. Initial call taken by: Darletta Moll,  August 11, 2010 4:12 PM  Follow-up for Phone Call        pt states there was confusion at the home she stays at and they were unable to bring her to appt yesterday with PW. Pt states she has been having increased SOB x 1 week and is requesting an appt with PW as soon as possible. I set pt to see PW tomorrow at 1:30pm. pt aware.Carron Curie CMA  August 11, 2010 4:28 PM

## 2010-10-15 NOTE — Assessment & Plan Note (Signed)
Summary: NP follow up - post hosp   Primary Ryan Palermo/Referring Latoshia Monrroy:  Dr. Waymon Amato  CC:  post hosp follow up - states breathing is dong well.  no new complaints..  History of Present Illness: 77yowf quit smoking in 1987  and chronic renal insufficiency with coronary artery disease.  The patient had history of congestive heart failure and diabetes.  The patient has been hospitalized on numerous occasions for respiratory failure and pneumonia.  Patient currently is residing in a nursing care facility.   May 13, 2010 11:24 AM Does not feel well for two weeks.   Stays ill and cannot eat.  Wants to have emesis but wants to gag. Notes more dyspnea and cough.  Difficult to go to bathroom with out sitting down No real fever.  No dysuria.  No change in bowels.  Pt with mild resp distress on presentation.  Pt with adult FTT.  Was just here on 8/17 and saw Np for copd exac and rx avelox. Now is worse.  Pt admitted for copd exac and poss bowel obstruction  May 26, 2010 --Presents for post hospital follow up. Says she is feeling much better.  05/13/2010- :  05/19/2010 for an Acute exacerbation of chronic obstructive pulmonary disease.  .  Chest x-ray w/ chronic changes.  Complicated by multiple comorbidities including diastolic heart     failure, cor pulmonale, deconditioning and stage III chronic kidney  disease.   Tx w/ inhaled     bronchodilators, oral antibiotics and IV steroids.  She has finished abx/steroids.  She was tx for constipation w/ aggressive bowel regimen. Since discharge she is much better. Has been having regular bowel movements. Denies chest pain,  orthopnea, hemoptysis, fever, n/v/d,  headache.   Medications Prior to Update: 1)  Lantus 100 Unit/ml Soln (Insulin Glargine) .... 5 Untis At Bedtime 2)  Pulmicort 0.25 Mg/79ml Susp (Budesonide) .... Four Times Daily 3)  Spiriva Handihaler 18 Mcg  Caps (Tiotropium Bromide Monohydrate) .... Two Puffs in Handihaler Daily 4)   Albuterol Sulfate (2.5 Mg/11ml) 0.083% Nebu (Albuterol Sulfate) .... Administer 6ml Via Neb Four Times Daily 5)  Novolog 100 Unit/ml Soln (Insulin Aspart) .... Sliding Scale With Breakfast, Lunch and Dinner and At Bedtime 6)  Novolog 100 Unit/ml Soln (Insulin Aspart) .... 5 Units Three Times A Day Ac 7)  Senokot S 8.6-50 Mg  Tabs (Sennosides-Docusate Sodium) .... One Tab At Bedtime 8)  Diltiazem Hcl Coated Beads 180 Mg Xr24h-Cap (Diltiazem Hcl Coated Beads) .... One Daily 9)  Aspir-Low 81 Mg Tbec (Aspirin) .... Once Daily 10)  Ferrous Sulfate 325 (65 Fe) Mg Tabs (Ferrous Sulfate) .... Once Daily 11)  Colace 100 Mg Caps (Docusate Sodium) .... Two Times A Day 12)  Demadex 20 Mg Tabs (Torsemide) .... 2 Tabs Two Times A Day 13)  Crestor 20 Mg Tabs (Rosuvastatin Calcium) .... Once Daily 14)  Protonix 40 Mg Tbec (Pantoprazole Sodium) .... Once Daily 15)  Enulose 10 Gm/81ml Soln (Lactulose Encephalopathy) .... 30ml By Mouth Two Times A Day - Hold For Diarrhea 16)  Hypotears 1-1 % Soln (Polyethyl Glycol-Polyvinyl Alc) .... 2 Drops Each Eye 17)  Lovaza 1 Gm Caps (Omega-3-Acid Ethyl Esters) .... Take 1 Capsule By Mouth Two Times A Day 18)  Allopurinol 100 Mg Tabs (Allopurinol) .... Two Times A Day 19)  Metolazone 2.5 Mg Tabs (Metolazone) .... Take One Tablet By Mouth Twice Weekly 20)  Klor-Con 10 10 Meq Cr-Tabs (Potassium Chloride) .... 4 Two Times A Day 21)  Ambien 10  Mg Tabs (Zolpidem Tartrate) .... At Bedtime As Needed 22)  Tramadol Hcl 50 Mg Tabs (Tramadol Hcl) .... Take 1 Tablet By Mouth Every 6 Hours As Needed 23)  Oxygen .... 2l/min Via Nasal Cannula Continuously 24)  Robaxin 500 Mg Tabs (Methocarbamol) .... 1/2 Tab As Needed Cramps 25)  Delsym 30 Mg/16ml Lqcr (Dextromethorphan Polistirex) .Marland Kitchen.. 10ml Every 12 Hours As Needed Cough 26)  Lorazepam 0.5 Mg Tabs (Lorazepam) .... 1/2 Tab Two Times A Day As Needed Anxiety 27)  Tylenol 325 Mg Tabs (Acetaminophen) .... 2 Every 6 Hours As Needed Pain or  Fever 28)  Promethazine Hcl 12.5 Mg Tabs (Promethazine Hcl) .... Take 1 Tablet By Mouth Every 6 Hours As Needed Nausea 29)  Vicodin 5-500 Mg Tabs (Hydrocodone-Acetaminophen) .Marland Kitchen.. 1 Tab By Mouth Every 4 Hours As Needed Pain 30)  Acidophilus Probiotic 100 Mg Caps (Lactobacillus) .... Two Times A Day  Current Medications (verified): 1)  Lantus 100 Unit/ml Soln (Insulin Glargine) .Marland Kitchen.. 15 Untis At Bedtime 2)  Senokot S 8.6-50 Mg  Tabs (Sennosides-Docusate Sodium) .... One Tab At Bedtime 3)  Spiriva Handihaler 18 Mcg  Caps (Tiotropium Bromide Monohydrate) .... Two Puffs in Handihaler Daily 4)  Novolog 100 Unit/ml Soln (Insulin Aspart) .... Sliding Scale With Breakfast, Lunch and Dinner and At Bedtime 5)  Albuterol Sulfate (2.5 Mg/8ml) 0.083% Nebu (Albuterol Sulfate) .... Administer 6ml Via Neb Four Times Daily 6)  Novolog 100 Unit/ml Soln (Insulin Aspart) .... 6 Units Three Times A Day Before Meals 7)  Aspir-Low 81 Mg Tbec (Aspirin) .... Once Daily 8)  Diltiazem Hcl Cr 240 Mg Xr24h-Cap (Diltiazem Hcl) .... Take 1 Capsule By Mouth Once A Day 9)  Ferrous Sulfate 325 (65 Fe) Mg Tabs (Ferrous Sulfate) .... Two Times A Day 10)  Demadex 20 Mg Tabs (Torsemide) .... 2 Tabs Two Times A Day 11)  Pulmicort 0.25 Mg/5ml Susp (Budesonide) .... Four Times Daily 12)  Crestor 20 Mg Tabs (Rosuvastatin Calcium) .... Once Daily 13)  Protonix 40 Mg Tbec (Pantoprazole Sodium) .... Once Daily 14)  Allopurinol 100 Mg Tabs (Allopurinol) .... Two Times A Day 15)  Lovaza 1 Gm Caps (Omega-3-Acid Ethyl Esters) .... Take 1 Tablet By Mouth Once A Day 16)  Klor-Con 10 10 Meq Cr-Tabs (Potassium Chloride) .Marland Kitchen.. 1 Tab By Mouth Four Times A Day 17)  Zolpidem Tartrate 5 Mg Tabs (Zolpidem Tartrate) .... Take 1 Tab By Mouth At Bedtime As Needed 18)  Enulose 10 Gm/40ml Soln (Lactulose Encephalopathy) .... 30ml By Mouth Two Times A Day - Hold For Diarrhea 19)  Oxygen .... 2l/min Via Nasal Cannula Continuously; Increase To 3l/min With  Activity 20)  Tramadol Hcl 50 Mg Tabs (Tramadol Hcl) .... Take 1 Tablet By Mouth Every 6 Hours As Needed 21)  Lorazepam 0.5 Mg Tabs (Lorazepam) .... 1/2-1 Tab Two Times A Day As Needed Anxiety 22)  Delsym 30 Mg/53ml Lqcr (Dextromethorphan Polistirex) .Marland Kitchen.. 10ml Every 12 Hours As Needed Cough  Allergies (verified): 1)  ! Codeine 2)  ! Penicillin 3)  ! * Guafenisin  Past History:  Past Medical History: Last updated: 12/29/2009 Congestive heart failure - diastolic COPD History of C Diff colitis Diabetes mellitus, type II Hypertension Hyperlipidemia Anemia-NOS Coronary artery disease (Catheterization April 2006 demonstrated LAD 40% stenosis.  This proximal stent which was patent with 40-50% stenosis.  First diagonal had ostial 60% stenosis, second diagonal was normal.  The circumflex had luminal irregularities.  There was a ramus intermediate with ostial 60% stenosis, mid obtuse marginal had luminal  irregularities, the right coronary artery is dominant with luminal irregularities.  The patient had PTCA of the LAD in 2002) Asystolic arrest  with pacemaker 2002 Renal insufficiency Pulmonary HTN Gallstones  Current Problems:  CORONARY ARTERY DISEASE (ICD-414.00) DIASTOLIC HEART FAILURE, CHRONIC (ICD-428.32) PACEMAKER, PERMANENT; MDT KAPPA (ICD-V45.01) SINOATRIAL NODE DYSFUNCTION (ICD-427.81) SYNCOPE (ICD-780.2) PERCUTANEOUS TRANSLUMINAL CORONARY ANGIOPLASTY, HX OF (ICD-V45.82) HYPERLIPIDEMIA (ICD-272.4) HYPERTENSION (ICD-401.9) DYSLIPIDEMIA (ICD-272.4) CHRONIC OBSTRUCTIVE PULMONARY DISEASE, ACUTE EXACERBATION (ICD-491.21) COPD (ICD-496) ACUTE BRONCHITIS (ICD-466.0) CHOLELITHIASIS, HX OF (ICD-V12.79) RENAL INSUFFICIENCY (ICD-588.9) ANEMIA-NOS (ICD-285.9) OBESITY (ICD-278.00) * SACRAL ULCERS CLOSTRIDIUM DIFFICILE COLITIS, HX OF DIARRHEA (ICD-V12.79) OSTEOARTHRITIS (ICD-715.90) ANEMIA (ICD-285.9) ALLERGIC RHINITIS (ICD-477.9) STASIS DERMATITIS (ICD-454.1) GOUT  (ICD-274.9) DIABETES MELLITUS, TYPE II (ICD-250.00)      Past Surgical History: Last updated: 11/30/2007 PERCUTANEOUS TRANSLUMINAL CORONARY ANGIOPLASTY, HX OF (ICD-V45.82) 2002  APPENDECTOMY, HX OF (ICD-V45.79) HYSTERECTOMY, HX OF (ICD-V45.77) PACEMAKER, PERMANENT (ICD-V45.01) 2002  Family History: Last updated: 10/12/2007 mother with DM, HTN father with heart disease  Social History: Last updated: 04/01/2009 lives at [facility] Former Smoker quit in 1987 Alcohol use-no 1 child widowed retired Hospital doctor  Risk Factors: Smoking Status: quit > 6 months (05/13/2010)  Review of Systems      See HPI  Vital Signs:  Patient profile:   78 year old female Height:      59 inches Weight:      175 pounds BMI:     35.47 O2 Sat:      100 % on 2 L/min pulsing Temp:     97.9 degrees F oral Pulse rate:   75 / minute BP sitting:   138 / 62  (right arm) Cuff size:   regular  Vitals Entered By: Boone Master CNA/MA (May 26, 2010 10:54 AM)  O2 Flow:  2 L/min pulsing CC: post hosp follow up - states breathing is dong well.  no new complaints. Is Patient Diabetic? Yes Comments Medications reviewed with patient Daytime contact number verified with patient. Boone Master CNA/MA  May 26, 2010 10:54 AM    Physical Exam  Additional Exam:  Gen: Pleasant, well-nourished, in no distress , normal affect   ENT: no lesions, no post nasal drip Neck: No JVD, no TMG, no carotid bruits Lungs:Coarse BS w/ no wheeizng  Cardiovascular: RRR, heart sounds normal, no murmurs or gallops, tr-1 + edema.  Abdomen: soft and non-tender, no HSM, BS normal Musculoskeletal: No deformities, no cyanosis or clubbing Neuro: alert, non-focal     Impression & Recommendations:  Problem # 1:  CHRONIC OBSTRUCTIVE PULMONARY DISEASE, ACUTE EXACERBATION (ICD-491.21)  Recent exacerbation complaicated by diastolic dysfnx  and constipation w/ abd distention now improved.  cont on present  reimgen.  flu shot today  follow up 2 weeks and as needed Dr. Delford Field   Orders: Est. Patient Level III (40347)  Problem # 2:  DIASTOLIC HEART FAILURE, CHRONIC (ICD-428.32)  improved on present regimen.  The following medications were removed from the medication list:    Metolazone 2.5 Mg Tabs (Metolazone) .Marland Kitchen... Take one tablet by mouth twice weekly Her updated medication list for this problem includes:    Aspir-low 81 Mg Tbec (Aspirin) ..... Once daily    Demadex 20 Mg Tabs (Torsemide) .Marland Kitchen... 2 tabs two times a day  Orders: Est. Patient Level III (42595)  Problem # 3:  ABDOMINAL DISTENSION (ICD-787.3)  resolved w/ stool softners.   Orders: Est. Patient Level III (63875)  Medications Added to Medication List This Visit: 1)  Lantus 100 Unit/ml Soln (Insulin glargine) .Marland KitchenMarland KitchenMarland Kitchen 15  untis at bedtime 2)  Novolog 100 Unit/ml Soln (Insulin aspart) .... 6 units three times a day before meals 3)  Diltiazem Hcl Cr 240 Mg Xr24h-cap (Diltiazem hcl) .... Take 1 capsule by mouth once a day 4)  Ferrous Sulfate 325 (65 Fe) Mg Tabs (Ferrous sulfate) .... Two times a day 5)  Lovaza 1 Gm Caps (Omega-3-acid ethyl esters) .... Take 1 tablet by mouth once a day 6)  Klor-con 10 10 Meq Cr-tabs (Potassium chloride) .Marland Kitchen.. 1 tab by mouth four times a day 7)  Zolpidem Tartrate 5 Mg Tabs (Zolpidem tartrate) .... Take 1 tab by mouth at bedtime as needed 8)  Lorazepam 0.5 Mg Tabs (Lorazepam) .... 1/2-1 tab two times a day as needed anxiety 9)  Oxygen  .... 2l/min via nasal cannula continuously; increase to 3l/min with activity  Complete Medication List: 1)  Pulmicort 0.25 Mg/70ml Susp (Budesonide) .... Four times daily 2)  Spiriva Handihaler 18 Mcg Caps (Tiotropium bromide monohydrate) .... Two puffs in handihaler daily 3)  Lantus 100 Unit/ml Soln (Insulin glargine) .Marland Kitchen.. 15 untis at bedtime 4)  Albuterol Sulfate (2.5 Mg/17ml) 0.083% Nebu (Albuterol sulfate) .... Administer 6ml via neb four times daily 5)  Novolog  100 Unit/ml Soln (Insulin aspart) .... Sliding scale with breakfast, lunch and dinner and at bedtime 6)  Novolog 100 Unit/ml Soln (Insulin aspart) .... 6 units three times a day before meals 7)  Senokot S 8.6-50 Mg Tabs (Sennosides-docusate sodium) .... One tab at bedtime 8)  Aspir-low 81 Mg Tbec (Aspirin) .... Once daily 9)  Diltiazem Hcl Cr 240 Mg Xr24h-cap (Diltiazem hcl) .... Take 1 capsule by mouth once a day 10)  Ferrous Sulfate 325 (65 Fe) Mg Tabs (Ferrous sulfate) .... Two times a day 11)  Demadex 20 Mg Tabs (Torsemide) .... 2 tabs two times a day 12)  Crestor 20 Mg Tabs (Rosuvastatin calcium) .... Once daily 13)  Protonix 40 Mg Tbec (Pantoprazole sodium) .... Once daily 14)  Enulose 10 Gm/82ml Soln (Lactulose encephalopathy) .... 30ml by mouth two times a day - hold for diarrhea 15)  Allopurinol 100 Mg Tabs (Allopurinol) .... Two times a day 16)  Lovaza 1 Gm Caps (Omega-3-acid ethyl esters) .... Take 1 tablet by mouth once a day 17)  Klor-con 10 10 Meq Cr-tabs (Potassium chloride) .Marland Kitchen.. 1 tab by mouth four times a day 18)  Zolpidem Tartrate 5 Mg Tabs (Zolpidem tartrate) .... Take 1 tab by mouth at bedtime as needed 19)  Tramadol Hcl 50 Mg Tabs (Tramadol hcl) .... Take 1 tablet by mouth every 6 hours as needed 20)  Lorazepam 0.5 Mg Tabs (Lorazepam) .... 1/2-1 tab two times a day as needed anxiety 21)  Delsym 30 Mg/28ml Lqcr (Dextromethorphan polistirex) .Marland Kitchen.. 10ml every 12 hours as needed cough 22)  Oxygen  .... 2l/min via nasal cannula continuously; increase to 3l/min with activity  Patient Instructions: 1)  Flu shot today.  2)  Continue on same meds.  3)  follow up Dr. Delford Field in 2 weeks.  4)  Please contact office for sooner follow up if symptoms do not improve or worsen      Appended Document: NP follow up - post hosp Noted and I agree  Appended Document: flu shot documentation     Clinical Lists Changes  Orders: Added new Service order of Flu Vaccine 71yrs + MEDICARE  PATIENTS ([id number]) - Signed Added new Service order of Administration Flu vaccine - MCR ([id number]) - Signed Observations: Added new observation of FLU VAX  VIS: 04/07/2010 version (05/26/2010 12:09) Added new observation of FLU VAXLOT: [id number] (05/26/2010 12:09) Added new observation of FLU VAXMFR: Glaxosmithkline (05/26/2010 12:09) Added new observation of FLU VAX EXP: 03/13/2011 (05/26/2010 12:09) Added new observation of FLU VAX DSE: 0.42ml (05/26/2010 12:09) Added new observation of FLU VAX: Fluvax 3+ (05/26/2010 12:09)     Flu Vaccine Consent Questions     Do you have a history of severe allergic reactions to this vaccine? no    Any prior history of allergic reactions to egg and/or gelatin? no    Do you have a sensitivity to the preservative Thimersol? no    Do you have a past history of Guillan-Barre Syndrome? no    Do you currently have an acute febrile illness? no    Have you ever had a severe reaction to latex? no    Vaccine information given and explained to patient? yes    Are you currently pregnant? no    Lot Number:[id number]   Exp Date:03/13/2011   Site Given  Left Deltoid IMmedflu  Elray Buba RN  May 26, 2010 12:11 PM

## 2010-10-15 NOTE — Letter (Signed)
Summary: CMN for Oxygen/[facility]  CMN for Oxygen/[facility]   Imported By: Lanelle Bal 12/26/2009 10:58:48  _____________________________________________________________________  External Attachment:    Type:   Image     Comment:   External Document

## 2010-10-15 NOTE — Letter (Signed)
Summary: [facility]  [address]   [place], [place] [address]   Phone: [phone]  Fax: [phone]    05/20/2010  [name] [address] [place], [place]  [address]  To whom it May Concern,  Patient does not need long term skilled nursing.  Patient is ok to return to assisted living.           Sincerely,     Shan Levans, MD

## 2010-10-15 NOTE — Assessment & Plan Note (Signed)
Summary: Pulmonary OV   Primary Chantil Bari/Referring Aniza Shor:  Dr. Waymon Amato  CC:  Follow up.  c/o increased SOB, wheezing, chest tightness, congestion, prod cough with thick white mucus, and sweats x 1 wk.  .  History of Present Illness: 77yowf quit smoking in 1987  and chronic renal insufficiency with coronary artery disease.  The patient had history of congestive heart failure and diabetes.  The patient has been hospitalized on numerous occasions for respiratory failure and pneumonia.  Patient currently is residing in a nursing care facility.   May 13, 2010 11:24 AM Does not feel well for two weeks.   Stays ill and cannot eat.  Wants to have emesis but wants to gag. Notes more dyspnea and cough.  Difficult to go to bathroom with out sitting down No real fever.  No dysuria.  No change in bowels.  Pt with mild resp distress on presentation.  Pt with adult FTT.  Was just here on 8/17 and saw Np for copd exac and rx avelox. Now is worse.  Pt admitted for copd exac and poss bowel obstruction  May 26, 2010 --Presents for post hospital follow up. Says she is feeling much better.  05/13/2010- :  05/19/2010 for an Acute exacerbation of chronic obstructive pulmonary disease.  .  Chest x-ray w/ chronic changes.  Complicated by multiple comorbidities including diastolic heart     failure, cor pulmonale, deconditioning and stage III chronic kidney  disease.   Tx w/ inhaled     bronchodilators, oral antibiotics and IV steroids.  She has finished abx/steroids.  She was tx for constipation w/ aggressive bowel regimen. Since discharge she is much better. Has been having regular bowel movements. Denies chest pain,  orthopnea, hemoptysis, fever, n/v/d,  headache.   June 09, 2010 2:43 PM Doing ok.   Still dyspneic with exertion and heavy in the chest area.  Notes some mucus and is white and thick.   Still edematous in the feet.   Is ambulatory .  August 12, 2010 2:16 PM The pt notes more dyspnea  esp with exertion. The pt notes more cough.  The mucus is prod white thick.  There is no discolored mucus.  The edema in feet the same, feels more bloated.    Pt denies any significant sore throat, nasal congestion or excess secretions, fever, chills, sweats, unintended weight loss, pleurtic or exertional chest pain, orthopnea PND, or leg swelling Pt denies any increase in rescue therapy over baseline, denies waking up needing it or having any early am or nocturnal exacerbations of coughing/wheezing/or dyspnea.   Current Medications (verified): 1)  Pulmicort 0.25 Mg/43ml Susp (Budesonide) .... Four Times Daily 2)  Spiriva Handihaler 18 Mcg  Caps (Tiotropium Bromide Monohydrate) .... Two Puffs in Handihaler Daily 3)  Lantus 100 Unit/ml Soln (Insulin Glargine) .Marland Kitchen.. 15 Untis At Bedtime 4)  Albuterol Sulfate (2.5 Mg/32ml) 0.083% Nebu (Albuterol Sulfate) .... Administer 6ml Via Neb Four Times Daily 5)  Novolog 100 Unit/ml Soln (Insulin Aspart) .... 6 Units Three Times A Day Before Meals 6)  Senokot S 8.6-50 Mg  Tabs (Sennosides-Docusate Sodium) .... One Tab At Bedtime 7)  Aspir-Low 81 Mg Tbec (Aspirin) .... Once Daily 8)  Diltiazem Hcl Cr 240 Mg Xr24h-Cap (Diltiazem Hcl) .... Take 1 Capsule By Mouth Once A Day 9)  Ferrous Sulfate 325 (65 Fe) Mg Tabs (Ferrous Sulfate) .... Two Times A Day 10)  Demadex 20 Mg Tabs (Torsemide) .... 2 Twice Daily 11)  Crestor 20 Mg Tabs (Rosuvastatin  Calcium) .... Once Daily 12)  Prilosec 20 Mg Cpdr (Omeprazole) .... Take 1 Tablet By Mouth Once A Day 13)  Enulose 10 Gm/76ml Soln (Lactulose Encephalopathy) .... 30ml By Mouth Once A Day,  Hold For Diarrhea 14)  Allopurinol 100 Mg Tabs (Allopurinol) .... Two Times A Day 15)  Klor-Con M20 20 Meq Cr-Tabs (Potassium Chloride Crys Cr) .Marland Kitchen.. 1 By Mouth Four Times Daily 16)  Zolpidem Tartrate 5 Mg Tabs (Zolpidem Tartrate) .... Take 1 Tab By Mouth At Bedtime As Needed 17)  Tramadol Hcl 50 Mg Tabs (Tramadol Hcl) .... Take 1 Tablet By  Mouth Every 6 Hours As Needed 18)  Lorazepam 0.5 Mg Tabs (Lorazepam) .... 1/2-1 Tab Two Times A Day As Needed Anxiety 19)  Delsym 30 Mg/49ml Lqcr (Dextromethorphan Polistirex) .Marland Kitchen.. 10ml Every 12 Hours As Needed Cough 20)  Oxygen .... 2l/min Via Nasal Cannula Continuously; Increase To 3l/min With Activity 21)  Promethazine Hcl 12.5 Mg Tabs (Promethazine Hcl) .... Take One Every 6 Hours As Needed Nausea  Allergies (verified): 1)  ! Codeine 2)  ! Penicillin 3)  ! * Guafenisin  Past History:  Past medical, surgical, family and social histories (including risk factors) reviewed, and no changes noted (except as noted below).  Past Medical History: Reviewed history from 12/29/2009 and no changes required. Congestive heart failure - diastolic COPD History of C Diff colitis Diabetes mellitus, type II Hypertension Hyperlipidemia Anemia-NOS Coronary artery disease (Catheterization April 2006 demonstrated LAD 40% stenosis.  This proximal stent which was patent with 40-50% stenosis.  First diagonal had ostial 60% stenosis, second diagonal was normal.  The circumflex had luminal irregularities.  There was a ramus intermediate with ostial 60% stenosis, mid obtuse marginal had luminal irregularities, the right coronary artery is dominant with luminal irregularities.  The patient had PTCA of the LAD in 2002) Asystolic arrest  with pacemaker 2002 Renal insufficiency Pulmonary HTN Gallstones  Current Problems:  CORONARY ARTERY DISEASE (ICD-414.00) DIASTOLIC HEART FAILURE, CHRONIC (ICD-428.32) PACEMAKER, PERMANENT; MDT KAPPA (ICD-V45.01) SINOATRIAL NODE DYSFUNCTION (ICD-427.81) SYNCOPE (ICD-780.2) PERCUTANEOUS TRANSLUMINAL CORONARY ANGIOPLASTY, HX OF (ICD-V45.82) HYPERLIPIDEMIA (ICD-272.4) HYPERTENSION (ICD-401.9) DYSLIPIDEMIA (ICD-272.4) CHRONIC OBSTRUCTIVE PULMONARY DISEASE, ACUTE EXACERBATION (ICD-491.21) COPD (ICD-496) ACUTE BRONCHITIS (ICD-466.0) CHOLELITHIASIS, HX OF  (ICD-V12.79) RENAL INSUFFICIENCY (ICD-588.9) ANEMIA-NOS (ICD-285.9) OBESITY (ICD-278.00) * SACRAL ULCERS CLOSTRIDIUM DIFFICILE COLITIS, HX OF DIARRHEA (ICD-V12.79) OSTEOARTHRITIS (ICD-715.90) ANEMIA (ICD-285.9) ALLERGIC RHINITIS (ICD-477.9) STASIS DERMATITIS (ICD-454.1) GOUT (ICD-274.9) DIABETES MELLITUS, TYPE II (ICD-250.00)      Past Surgical History: Reviewed history from 11/30/2007 and no changes required. PERCUTANEOUS TRANSLUMINAL CORONARY ANGIOPLASTY, HX OF (ICD-V45.82) 2002  APPENDECTOMY, HX OF (ICD-V45.79) HYSTERECTOMY, HX OF (ICD-V45.77) PACEMAKER, PERMANENT (ICD-V45.01) 2002  Family History: Reviewed history from 10/12/2007 and no changes required. mother with DM, HTN father with heart disease  Social History: Reviewed history from 06/09/2010 and no changes required. lives at [facility] Former Smoker quit in 1987.  2 ppd x 25 yrs. Alcohol use-no 1 child widowed retired Hospital doctor  Review of Systems       The patient complains of shortness of breath with activity, shortness of breath at rest, productive cough, non-productive cough, abdominal pain, hand/feet swelling, and change in color of mucus.  The patient denies coughing up blood, chest pain, irregular heartbeats, acid heartburn, indigestion, loss of appetite, weight change, difficulty swallowing, sore throat, tooth/dental problems, headaches, nasal congestion/difficulty breathing through nose, sneezing, itching, ear ache, anxiety, depression, joint stiffness or pain, rash, and fever.    Vital Signs:  Patient profile:   77 year old female  Height:      59 inches Weight:      177.13 pounds BMI:     35.91 O2 Sat:      99 % on 3 L/minpulsed Temp:     99.4 degrees F oral Pulse rate:   92 / minute BP sitting:   140 / 78  (right arm) Cuff size:   large  Vitals Entered By: Gweneth Dimitri RN (August 12, 2010 2:01 PM)  O2 Flow:  3 L/minpulsed CC: Follow up.  c/o increased SOB, wheezing, chest  tightness, congestion, prod cough with thick white mucus, sweats x 1 wk.   Comments Medications reviewed with patient Daytime contact number verified with patient. Gweneth Dimitri RN  August 12, 2010 2:03 PM    Physical Exam  Additional Exam:  Gen: Pleasant, well-nourished, in no distress , normal affect   ENT: no lesions, no post nasal drip Neck: No JVD, no TMG, no carotid bruits Lungs:Coarse BS w/ exp wheeizng  Cardiovascular: RRR, heart sounds normal, no murmurs or gallops, tr-1 + edema.  Abdomen: soft and non-tender, no HSM, BS normal Musculoskeletal: No deformities, no cyanosis or clubbing Neuro: alert, non-focal skin: R forearm superficial skin abscess and cellulitis    Impression & Recommendations:  Problem # 1:  CELLULITIS AND ABSCESS OF UPPER ARM AND FOREARM (ICD-682.3) Assessment Deteriorated R forearm cellulitis and superficial skin abscess plan bactrim DS one two times a day  10days, wil also rx bronchitis Her updated medication list for this problem includes:    Sulfamethoxazole-trimethoprim 400-80 Mg/34ml Soln (Sulfamethoxazole-trimethoprim) ..... One by mouth two times a day  Orders: Est. Patient Level V (14782)  Problem # 2:  CHRONIC OBSTRUCTIVE PULMONARY DISEASE, ACUTE EXACERBATION (ICD-491.21) Assessment: Deteriorated copd mild exac with periph edema plan increase diuretic transiently then reduce to mtn dose pulse pred bactrim two times a day x 10days  Medications Added to Medication List This Visit: 1)  Demadex 20 Mg Tabs (Torsemide) .... 2 twice daily 2)  Demadex 20 Mg Tabs (Torsemide) .... 3  twice daily for 4 days then 2 twice daily 3)  Prilosec 20 Mg Cpdr (Omeprazole) .... Take 1 tablet by mouth once a day 4)  Sulfamethoxazole-trimethoprim 400-80 Mg/6ml Soln (Sulfamethoxazole-trimethoprim) .... One by mouth two times a day 5)  Prednisone 10 Mg Tabs (Prednisone) .... Take as directed take 4 daily for two days, then 3 daily for two days, then two  daily for two days then one daily for two days then stop  Patient Instructions: 1)  Increase  demedex to three twice daily for 4 days then reduce to 2 twice daily 2)  Bactrim DS one twice daily for 10days for lung /arm infection 3)  Prednisone 10mg  Take 4 daily for two days, then 3 daily for two days, then two daily for two days then one daily for two days then stop 4)  All other medications the same 5)  Return 2 months Prescriptions: PREDNISONE 10 MG  TABS (PREDNISONE) Take as directed Take 4 daily for two days, then 3 daily for two days, then two daily for two days then one daily for two days then stop  #20 x 0   Entered and Authorized by:   Storm Frisk MD   Signed by:   Storm Frisk MD on 08/12/2010   Method used:   Print then Give to Patient   RxID:   [id number] SULFAMETHOXAZOLE-TRIMETHOPRIM 400-80 MG/5ML SOLN (SULFAMETHOXAZOLE-TRIMETHOPRIM) One by mouth two times a day  #20 x 0  Entered and Authorized by:   Storm Frisk MD   Signed by:   Storm Frisk MD on 08/12/2010   Method used:   Print then Give to Patient   RxID:   [id number]     Appended Document: Pulmonary OV fax dr Waymon Amato

## 2010-10-15 NOTE — Assessment & Plan Note (Signed)
Summary: device/saf  Medications Added METOLAZONE 2.5 MG TABS (METOLAZONE) Take one tablet by mouth twice weekly      Allergies Added:   Primary Provider:  Dr. Waymon Amato   History of Present Illness:    [name] is seen in followup for syncope for which he underwent pacemaker implantation some time ago. She had no recurrent syncope.  She has recently been hospitalized for congestive heart failure with sounds like C. difficile colitis.  she has also had problems with her respiratory status and energy level since that time. She saw Dr. Antoine Poche a  couple of months ago; fluid overload was again evident. Because of the elevated creatinine measured at that time at 1.7 he was reluctant to increase her diuretics.  She also complains of recurrent and persistent cough over the last month. She has not seen Dr. Delford Field recently.  Current Medications (verified): 1)  Lantus 100 Unit/ml Soln (Insulin Glargine) .... 5 Untis At Bedtime 2)  Promethazine Hcl 12.5 Mg  Tabs (Promethazine Hcl) .Marland Kitchen.. 1 By Mouth Q 6 Hrs Prn 3)  Klor-Con M20 20 Meq  Tbcr (Potassium Chloride Crys Cr) .... Take 1 Tablet By Mouth Two Times A Day 4)  Senokot S 8.6-50 Mg  Tabs (Sennosides-Docusate Sodium) .... One Tab At Bedtime 5)  Oxygen .... 2l Continuous 6)  Spiriva Handihaler 18 Mcg  Caps (Tiotropium Bromide Monohydrate) .... Two Puffs in Handihaler Daily 7)  Novolog 100 Unit/ml Soln (Insulin Aspart) .... Sliding Scale With Breakfast, Lunch and Dinner 8)  Lorazepam 0.5 Mg Tabs (Lorazepam) .... Take 1/2  Tablet By Mouth Once A Day 9)  Tylenol 325 Mg Tabs (Acetaminophen) .... 2 By Mouth Every 6 Hours As Needed 10)  Albuterol Sulfate (2.5 Mg/42ml) 0.083% Nebu (Albuterol Sulfate) .... Administer 6ml Via Neb Four Times Daily 11)  Novolog 100 Unit/ml Soln (Insulin Aspart) .... 5 Units Three Times A Day Ac 12)  Aspir-Low 81 Mg Tbec (Aspirin) .... Once Daily 13)  Diltiazem Hcl Coated Beads 180 Mg Xr24h-Cap (Diltiazem Hcl Coated  Beads) .... One Daily 14)  Ferrous Sulfate 325 (65 Fe) Mg Tabs (Ferrous Sulfate) .... Once Daily 15)  Colace 100 Mg Caps (Docusate Sodium) .... Two Times A Day 16)  Demadex 20 Mg Tabs (Torsemide) .... 2 Tabs Two Times A Day 17)  Pulmicort 0.25 Mg/32ml Susp (Budesonide) .... Four Times Daily 18)  Crestor 20 Mg Tabs (Rosuvastatin Calcium) .... Once Daily 19)  Protonix 40 Mg Tbec (Pantoprazole Sodium) .... Once Daily 20)  Tussionex Pennkinetic Er 8-10 Mg/51ml Lqcr (Chlorpheniramine-Hydrocodone) .... 5cc Q12h As Needed For Cough 21)  Duoneb 0.5-2.5 (3) Mg/36ml Soln (Ipratropium-Albuterol) .... Every 6 Hours X72 Hours 22)  Allopurinol 100 Mg Tabs (Allopurinol) .... Two Times A Day 23)  Robaxin 500 Mg Tabs (Methocarbamol) .... 1/2 Tab Once Daily As Needed 24)  Ultram 50 Mg Tabs (Tramadol Hcl) .... Take 1 Tablet By Mouth Every 6 Hours As Needed 25)  Hypotears 1-1 % Soln (Polyethyl Glycol-Polyvinyl Alc) .... 2 Drops Each Eye 26)  Lovaza 1 Gm Caps (Omega-3-Acid Ethyl Esters) .... Take 1 Capsule By Mouth Two Times A Day 27)  Metolazone 2.5 Mg Tabs (Metolazone) .... Take One Tablet By Mouth Twice Weekly  Allergies (verified): 1)  ! Codeine 2)  ! Penicillin  Past History:  Past Medical History: Last updated: 12/29/2009 Congestive heart failure - diastolic COPD History of C Diff colitis Diabetes mellitus, type II Hypertension Hyperlipidemia Anemia-NOS Coronary artery disease (Catheterization April 2006 demonstrated LAD 40% stenosis.  This  proximal stent which was patent with 40-50% stenosis.  First diagonal had ostial 60% stenosis, second diagonal was normal.  The circumflex had luminal irregularities.  There was a ramus intermediate with ostial 60% stenosis, mid obtuse marginal had luminal irregularities, the right coronary artery is dominant with luminal irregularities.  The patient had PTCA of the LAD in 2002) Asystolic arrest  with pacemaker 2002 Renal insufficiency Pulmonary  HTN Gallstones  Current Problems:  CORONARY ARTERY DISEASE (ICD-414.00) DIASTOLIC HEART FAILURE, CHRONIC (ICD-428.32) PACEMAKER, PERMANENT; MDT KAPPA (ICD-V45.01) SINOATRIAL NODE DYSFUNCTION (ICD-427.81) SYNCOPE (ICD-780.2) PERCUTANEOUS TRANSLUMINAL CORONARY ANGIOPLASTY, HX OF (ICD-V45.82) HYPERLIPIDEMIA (ICD-272.4) HYPERTENSION (ICD-401.9) DYSLIPIDEMIA (ICD-272.4) CHRONIC OBSTRUCTIVE PULMONARY DISEASE, ACUTE EXACERBATION (ICD-491.21) COPD (ICD-496) ACUTE BRONCHITIS (ICD-466.0) CHOLELITHIASIS, HX OF (ICD-V12.79) RENAL INSUFFICIENCY (ICD-588.9) ANEMIA-NOS (ICD-285.9) OBESITY (ICD-278.00) * SACRAL ULCERS CLOSTRIDIUM DIFFICILE COLITIS, HX OF DIARRHEA (ICD-V12.79) OSTEOARTHRITIS (ICD-715.90) ANEMIA (ICD-285.9) ALLERGIC RHINITIS (ICD-477.9) STASIS DERMATITIS (ICD-454.1) GOUT (ICD-274.9) DIABETES MELLITUS, TYPE II (ICD-250.00)      Past Surgical History: Last updated: 11/30/2007 PERCUTANEOUS TRANSLUMINAL CORONARY ANGIOPLASTY, HX OF (ICD-V45.82) 2002  APPENDECTOMY, HX OF (ICD-V45.79) HYSTERECTOMY, HX OF (ICD-V45.77) PACEMAKER, PERMANENT (ICD-V45.01) 2002  Family History: Last updated: 10/12/2007 mother with DM, HTN father with heart disease  Social History: Last updated: 04/01/2009 lives at [facility] Former Smoker quit in 1987 Alcohol use-no 1 child widowed retired Hospital doctor  Vital Signs:  Patient profile:   77 year old female Height:      59 inches Weight:      183 pounds Pulse rate:   88 / minute Pulse rhythm:   regular BP sitting:   164 / 76  (left arm) Cuff size:   large  Vitals Entered By: Judithe Modest CMA (December 30, 2009 12:14 PM)  Physical Exam  General:  Well developed, well nourished, in no acute distress.wearing oxygen Head:  normal HEENT Neck:  78 cm Lungs:  rhonchi and wheezing with good air movement Heart:  regular rate and rhythm without murmurs or gallops Abdomen:  soft nontender Msk:  in a wheelchair Extremities:  bilateral  swollen lower extremities with some blistering Skin:  Intact without lesions or rashes. Psych:  affect is engaging   PPM Specifications Following MD:  Sherryl Manges, MD     PPM Vendor:  [employer]     PPM Model Number:  901     PPM Serial Number:  [id number] PPM DOI:  09/12/2001     PPM Implanting MD:  Sherryl Manges, MD  Lead 1    Location: RA     DOI: 09/12/2001     Model #: [id number]     Serial #: [id number]     Status: active Lead 2    Location: RV     DOI: 09/12/2001     Model #: [id number]     Serial #: [id number]     Status: active  Magnet Response Rate:  BOL 85 ERI 65  Indications:  Syncope with LBBB   PPM Follow Up Remote Check?  No Battery Voltage:  2.70 V     Battery Est. Longevity:  17 months     Pacer Dependent:  No       PPM Device Measurements Atrium  Amplitude: 1.4 mV, Impedance: 487 ohms, Threshold: 0.5 V at 0.4 msec Right Ventricle  Amplitude: 11.20 mV, Impedance: 465 ohms, Threshold: 0.75 V at 0.4 msec  Episodes MS Episodes:  226     Percent Mode Switch:  0.3%     Coumadin:  No Ventricular High Rate:  11  Atrial Pacing:  3.2%     Ventricular Pacing:  2.8%  Parameters Mode:  DDD     Lower Rate Limit:  60     Upper Rate Limit:  130 Paced AV Delay:  200     Sensed AV Delay:  140 Next Cardiology Appt Due:  07/14/2010 Tech Comments:  RA reprogrammed 2.5@0 .4.  Longest mode switch >1 hour.  VHR episodes all short 1-2 seconds.  Checked by [employer]. Altha Harm, LPN  December 30, 2009 12:08 PM   Impression & Recommendations:  Problem # 1:  DIASTOLIC HEART FAILURE, CHRONIC (ICD-428.32)  her fluid issue remains a problem. I don't think the diltiazem is likely a culprit. Her creatinine was 1.7 as noted 2 months ago. We will plan to give her Zaroxolyn 2 days a week for a total of 2 weeks she is to have a metabolic profile obtained thereafter. She is to follow up with her primary care physician a couple of weeks after that. Her updated medication list for this problem includes:     Aspir-low 81 Mg Tbec (Aspirin) ..... Once daily    Diltiazem Hcl Coated Beads 180 Mg Xr24h-cap (Diltiazem hcl coated beads) ..... One daily    Demadex 20 Mg Tabs (Torsemide) .Marland Kitchen... 2 tabs two times a day    Metolazone 2.5 Mg Tabs (Metolazone) .Marland Kitchen... Take one tablet by mouth twice weekly  Orders: TLB-BMP (Basic Metabolic Panel-BMET) (80048-METABOL) TLB-BNP (B-Natriuretic Peptide) (83880-BNPR)  Her updated medication list for this problem includes:    Aspir-low 81 Mg Tbec (Aspirin) ..... Once daily    Diltiazem Hcl Coated Beads 180 Mg Xr24h-cap (Diltiazem hcl coated beads) ..... One daily    Demadex 20 Mg Tabs (Torsemide) .Marland Kitchen... 2 tabs two times a day  Problem # 2:  CHRONIC OBSTRUCTIVE PULMONARY DISEASE, ACUTE EXACERBATION (ICD-491.21) She has a nasty sounding cough. She apparently has not been given antibiotics. We'll set her up to see Dr. Delford Field again I will touch base with him to see whether antibiotics in the interim would be appropriate.  I spoke with Dr. Delford Field and he will begin her on Ceftin Her updated medication list for this problem includes:    Spiriva Handihaler 18 Mcg Caps (Tiotropium bromide monohydrate) .Marland Kitchen..Marland Kitchen Two puffs in handihaler daily    Albuterol Sulfate (2.5 Mg/12ml) 0.083% Nebu (Albuterol sulfate) .Marland Kitchen... Administer 6ml via neb four times daily    Pulmicort 0.25 Mg/38ml Susp (Budesonide) .Marland Kitchen... Four times daily    Duoneb 0.5-2.5 (3) Mg/50ml Soln (Ipratropium-albuterol) ..... Every 6 hours x72 hours  Problem # 3:  PACEMAKER, PERMANENT; MDT KAPPA (ICD-V45.01) Device parameters and data were reviewed and no changes were made  Problem # 4:  SYNCOPE (ICD-780.2)  no recurrence syncope Her updated medication list for this problem includes:    Aspir-low 81 Mg Tbec (Aspirin) ..... Once daily    Diltiazem Hcl Coated Beads 180 Mg Xr24h-cap (Diltiazem hcl coated beads) ..... One daily  Her updated medication list for this problem includes:    Aspir-low 81 Mg Tbec (Aspirin) ..... Once  daily    Diltiazem Hcl Coated Beads 180 Mg Xr24h-cap (Diltiazem hcl coated beads) ..... One daily  Problem # 5:  CORONARY ARTERY DISEASE (ICD-414.00) without chest pain Her updated medication list for this problem includes:    Aspir-low 81 Mg Tbec (Aspirin) ..... Once daily    Diltiazem Hcl Coated Beads 180 Mg Xr24h-cap (Diltiazem hcl coated beads) ..... One daily  Patient Instructions: 1)  Your physician has recommended you make the  following change in your medication: Add Zaroxyln 2.5mg  1 tablet twice weekly. 2)  Your physician recommends that you return for lab work today. 3)  Your physician wants you to follow-up in: 12 months with Dr Graciela Husbands.  You will receive a reminder letter in the mail two months in advance. If you don't receive a letter, please call our office to schedule the follow-up appointment.

## 2010-10-15 NOTE — Progress Notes (Signed)
Summary: Urine culture result   Phone Note Outgoing Call   Reason for Call: Discuss lab or test results Summary of Call: call the pt and tell her the urine culture was normal.  stay on medicine for her bronchtiis Initial call taken by: Storm Frisk MD,  March 09, 2010 2:38 PM  Follow-up for Phone Call        Called, spoke with pt.  Pt informed of above results and recs pr PW.  She verbalized understanding. Gweneth Dimitri RN  March 09, 2010 4:12 PM

## 2010-10-15 NOTE — Assessment & Plan Note (Signed)
Summary: per check out/sf /appt is 12;00/ gd  Medications Added DILTIAZEM HCL COATED BEADS 180 MG XR24H-CAP (DILTIAZEM HCL COATED BEADS) ONE DAILY      Allergies Added:   Visit Type:  Follow-up Primary Provider:  Dr. Waymon Amato  CC:  Diastolic HF and HTN.  History of Present Illness: The patient presents for evaluation of the above. I last saw her in the office and hospitalized her for respiratory failure. She had volume overload and was also treated for an exacerbation of her chronic lung disease and possible pneumonia. She has been back at [facility]. She recently saw Dr. Delford Field and is being treated with a pulse of steroids for possible flare of her bronchitis. She has had some slowly increased weights and increased edema. She keeps her feet down throughout the day. I do believe her salt is restricted. She is not getting daily weights were frequent blood pressures.  She sleeps in a hospital bed with her head elevated and her feet elevated. She is not describing PND or orthopnea. She is not having neck or arm discomfort.  Of note she does report some chest discomfort that is only at night, its nagging. It goes away when she falls asleep. It's up near her pacemaker. She can't quantify or qualify it. She doesn't think it's like previous heart pain. It's only at rest.  Current Medications (verified): 1)  Lantus 100 Unit/ml Soln (Insulin Glargine) .... 5 Untis At Bedtime 2)  Promethazine Hcl 12.5 Mg  Tabs (Promethazine Hcl) .Marland Kitchen.. 1 By Mouth Q 6 Hrs Prn 3)  Klor-Con M20 20 Meq  Tbcr (Potassium Chloride Crys Cr) .... Take 1 Tablet By Mouth Two Times A Day 4)  Senokot S 8.6-50 Mg  Tabs (Sennosides-Docusate Sodium) .... One Tab At Bedtime 5)  Oxygen .... 2l Continuous 6)  Spiriva Handihaler 18 Mcg  Caps (Tiotropium Bromide Monohydrate) .... Two Puffs in Handihaler Daily 7)  Novolog 100 Unit/ml Soln (Insulin Aspart) .... Sliding Scale With Breakfast, Lunch and Dinner 8)  Lorazepam 0.5 Mg Tabs  (Lorazepam) .... Take 1/2  Tablet By Mouth Once A Day 9)  Tylenol 325 Mg Tabs (Acetaminophen) .... 2 By Mouth Every 6 Hours As Needed 10)  Albuterol Sulfate (2.5 Mg/75ml) 0.083% Nebu (Albuterol Sulfate) .... Administer 6ml Via Neb Four Times Daily 11)  Novolog 100 Unit/ml Soln (Insulin Aspart) .... 5 Units Three Times A Day Ac 12)  Aspir-Low 81 Mg Tbec (Aspirin) .... Once Daily 13)  Cardizem 120 Mg Tabs (Diltiazem Hcl) .... Once Daily 14)  Ferrous Sulfate 325 (65 Fe) Mg Tabs (Ferrous Sulfate) .... Once Daily 15)  Colace 100 Mg Caps (Docusate Sodium) .... Two Times A Day 16)  Demadex 20 Mg Tabs (Torsemide) .... 2 Tabs Two Times A Day 17)  Pulmicort 0.25 Mg/65ml Susp (Budesonide) .... Four Times Daily 18)  Crestor 20 Mg Tabs (Rosuvastatin Calcium) .... Once Daily 19)  Protonix 40 Mg Tbec (Pantoprazole Sodium) .... Once Daily 20)  Tussionex Pennkinetic Er 8-10 Mg/53ml Lqcr (Chlorpheniramine-Hydrocodone) .... 5cc Q12h As Needed For Cough 21)  Duoneb 0.5-2.5 (3) Mg/74ml Soln (Ipratropium-Albuterol) .... Every 6 Hours X72 Hours 22)  Allopurinol 100 Mg Tabs (Allopurinol) .... Two Times A Day 23)  Robaxin 500 Mg Tabs (Methocarbamol) .... 1/2 Tab Once Daily As Needed 24)  Ultram 50 Mg Tabs (Tramadol Hcl) .... Take 1 Tablet By Mouth Every 6 Hours As Needed 25)  Hypotears 1-1 % Soln (Polyethyl Glycol-Polyvinyl Alc) .... 2 Drops Each Eye 26)  Lovaza 1 Gm  Caps (Omega-3-Acid Ethyl Esters) .... Take 1 Capsule By Mouth Two Times A Day 27)  Prednisone 10 Mg  Tabs (Prednisone) .... Take As Directed 4 Each Am X3days, 3 X 3days, 2 X 3days, 1 X 3days Then Stop  Allergies (verified): 1)  ! Codeine 2)  ! Penicillin  Past History:  Past Medical History: Reviewed history from 04/01/2009 and no changes required. Congestive heart failure - diastolic COPD History of C Diff colitis Diabetes mellitus, type II Hypertension Hyperlipidemia Anemia-NOS Coronary artery disease (Catheterization April 2006 demonstrated  LAD 40% stenosis.  This proximal stent which was patent with 40-50% stenosis.  First diagonal had ostial 60% stenosis, second diagonal was normal.  The circumflex had luminal irregularities.  There was a ramus intermediate with ostial 60% stenosis, mid obtuse marginal had luminal irregularities, the right coronary artery is dominant with luminal irregularities.  The patient had PTCA of the LAD in 2002) Asystolic arrest  with pacemaker 2002 Renal insufficiency Pulmonary HTN Gallstones    Past Surgical History: Reviewed history from 11/30/2007 and no changes required. PERCUTANEOUS TRANSLUMINAL CORONARY ANGIOPLASTY, HX OF (ICD-V45.82) 2002  APPENDECTOMY, HX OF (ICD-V45.79) HYSTERECTOMY, HX OF (ICD-V45.77) PACEMAKER, PERMANENT (ICD-V45.01) 2002  Review of Systems       As stated in the HPI and negative for all other systems.   Vital Signs:  Patient profile:   77 year old female Height:      59 inches Weight:      181 pounds BMI:     36.69 Pulse rate:   98 / minute Resp:     16 per minute BP sitting:   198 / 78  (right arm)  Vitals Entered By: Marrion Coy, CNA (November 06, 2009 12:30 PM)  Physical Exam  General:  Well developed, well nourished, in no acute distress. Head:  normocephalic and atraumatic Eyes:  PERRLA/EOM intact; conjunctiva and lids normal. Mouth:  Dentures, gums and palate normal. Oral mucosa normal. Neck:  Neck supple, no JVD. No masses, thyromegaly or abnormal cervical nodes. Chest Wall:  Well healed pacemaker pocket Lungs:  Scattered diffuse coarse crackles without wheezing Abdomen:  Bowel sounds positive; abdomen soft and non-tender without masses, organomegaly, or hernias noted. No hepatosplenomegaly, obese Msk:  Back normal, normal gait. Muscle strength and tone normal. Extremities:  mild bilateral edema Neurologic:  Alert and oriented x 3. Skin:  Intact without lesions or rashes. Cervical Nodes:  no significant adenopathy Psych:  Normal  affect.   Detailed Cardiovascular Exam  Neck    Carotids: Carotids full and equal bilaterally without bruits.      Neck Veins: Normal, no JVD.    Heart    Inspection: no deformities or lifts noted.      Palpation: normal PMI with no thrills palpable.      Auscultation: S1 and S2 within normal limits, no S3, 2/6 apical systolic murmur radiating at the aortic outflow tract, no diastolic murmurs  Vascular    Abdominal Aorta: no palpable masses, pulsations, or audible bruits, difficult to evaluate secondary to obesity      Femoral Pulses: normal femoral pulses bilaterally.      Pedal Pulses: bilateral diminished dorsalis pedis and posterior tibialis    Radial Pulses: normal radial pulses bilaterally.      Peripheral Circulation: no clubbing, cyanosis, or edema noted with normal capillary refill.     EKG  Procedure date:  11/06/2009  Findings:      sinus rhythm, rate 98, axis rightward, intervals within normal limits, nonspecific  T-wave flattening  PPM Specifications Following MD:  Sherryl Manges, MD     PPM Vendor:  [employer]     PPM Model Number:  [id number]     PPM Serial Number:  [id number] PPM DOI:  09/12/2001     PPM Implanting MD:  Sherryl Manges, MD  Lead 1    Location: [facility]     DOI: 09/12/2001     Model #: [id number]     Serial #: [id number]     Status: active Lead 2    Location: [facility]     DOI: 09/12/2001     Model #: [id number]     Serial #: [id number]     Status: active   Indications:  Syncope with LBBB   PPM Follow Up Pacer Dependent:  No      Episodes Coumadin:  No  Parameters Mode:  DDD     Lower Rate Limit:  60     Upper Rate Limit:  130 Paced AV Delay:  200     Sensed AV Delay:  140  Impression & Recommendations:  Problem # 1:  DIASTOLIC HEART FAILURE, CHRONIC (ICD-428.32) She does have some evidence of increased peripheral edema. At this point however I am reluctant to aggressively diuresis her further as her renal function has been so tenuous. Rather I have asked the nursing home  to weigh her every other day at least and to send [place] a record. If she has weights to trend upward I would need to address this.  Problem # 2:  RENAL INSUFFICIENCY (ICD-588.9) I will check a basic metabolic profile today. Orders: TLB-BMP (Basic Metabolic Panel-BMET) (80048-METABOL)  Problem # 3:  CORONARY ARTERY DISEASE (ICD-414.00) No further cardiovascular testing is suggested. Her updated medication list for this problem includes:    Aspir-low 81 Mg Tbec (Aspirin) ..... Once daily    Diltiazem Hcl Coated Beads 180 Mg Xr24h-cap (Diltiazem hcl coated beads) ..... One daily  Orders: EKG w/ Interpretation (93000)  Problem # 4:  HYPERTENSION (ICD-401.9) Her blood pressure is not well controlled. I have requested that a blood pressure diary be kept at her nursing home and sent to [place] in about one month. I will also increase her Cardizem to 180 mg daily. Orders: TLB-BMP (Basic Metabolic Panel-BMET) (80048-METABOL)  Patient Instructions: 1)  Your physician recommends that you schedule a follow-up appointment in: 6 MONTHS WITH [facility] 2)  Your physician has recommended you make the following change in your medication:   CARDIAZEM CD 180 MG DAILY. 3)  RECORD WEIGHT EVERY OTHER DAY 4)  CHECK BP DAILY AT RANDOM TIMES. Prescriptions: DILTIAZEM HCL COATED BEADS 180 MG XR24H-CAP (DILTIAZEM HCL COATED BEADS) ONE DAILY  #30 x 11   Entered by:   Charolotte Capuchin, RN   Authorized by:   Rollene Rotunda, MD, [facility]   Signed by:   Charolotte Capuchin, RN on 11/06/2009   Method used:   Print then Give to Patient   RxID:   [id number]

## 2010-10-15 NOTE — Progress Notes (Signed)
Summary: nos appt  Phone Note Call from Patient   Caller: [email or web address]  Call For: wright Summary of Call: In ref to nos from 11/28, pt has appt 11/30. Initial call taken by: Darletta Moll,  August 11, 2010 4:36 PM

## 2010-10-15 NOTE — Assessment & Plan Note (Signed)
Summary: rov/heaviness in chest/sob/jml  Medications Added KLOR-CON M20 20 MEQ CR-TABS (POTASSIUM CHLORIDE CRYS CR) 1 by mouth qid      Allergies Added:   Visit Type:  Follow-up Primary Provider:  Dr. Waymon Amato  CC:  Diastolic HF.  History of Present Illness: The patient presents for evaluation of dyspnea.  She was recently hospitalized with acute SOB.  She was not felt to have a heart problem at that time.  There was no echo or BNP ordered it was felt to be primary pulmonary.  She was treated with steroids and antibiotics with improvement.  She did think that she had swelling in her legs and she thought that she had palpitations with steroids but no volume overload or arrhythmias was documented.  I have reviewed the hospital notes.  Since discharge she has been slowly improving.  She denies new chest pain.  Her breathing is improved.  Weights have been stable.  She has no palpitations, presyncope or syncope.  Current Medications (verified): 1)  Pulmicort 0.25 Mg/13ml Susp (Budesonide) .... Four Times Daily 2)  Spiriva Handihaler 18 Mcg  Caps (Tiotropium Bromide Monohydrate) .... Two Puffs in Handihaler Daily 3)  Lantus 100 Unit/ml Soln (Insulin Glargine) .Marland Kitchen.. 15 Untis At Bedtime 4)  Albuterol Sulfate (2.5 Mg/65ml) 0.083% Nebu (Albuterol Sulfate) .... Administer 6ml Via Neb Four Times Daily 5)  Novolog 100 Unit/ml Soln (Insulin Aspart) .... 6 Units Three Times A Day Before Meals 6)  Senokot S 8.6-50 Mg  Tabs (Sennosides-Docusate Sodium) .... One Tab At Bedtime 7)  Aspir-Low 81 Mg Tbec (Aspirin) .... Once Daily 8)  Diltiazem Hcl Cr 240 Mg Xr24h-Cap (Diltiazem Hcl) .... Take 1 Capsule By Mouth Once A Day 9)  Ferrous Sulfate 325 (65 Fe) Mg Tabs (Ferrous Sulfate) .... Two Times A Day 10)  Demadex 20 Mg Tabs (Torsemide) .... 2 Tabs Two Times A Day 11)  Crestor 20 Mg Tabs (Rosuvastatin Calcium) .... Once Daily 12)  Protonix 40 Mg Tbec (Pantoprazole Sodium) .... Once Daily 13)  Enulose 10 Gm/43ml  Soln (Lactulose Encephalopathy) .... 30ml By Mouth Two Times A Day - Hold For Diarrhea 14)  Allopurinol 100 Mg Tabs (Allopurinol) .... Two Times A Day 15)  Lovaza 1 Gm Caps (Omega-3-Acid Ethyl Esters) .... Take 1 Tablet By Mouth Once A Day 16)  Klor-Con M20 20 Meq Cr-Tabs (Potassium Chloride Crys Cr) .Marland Kitchen.. 1 By Mouth Qid 17)  Zolpidem Tartrate 5 Mg Tabs (Zolpidem Tartrate) .... Take 1 Tab By Mouth At Bedtime As Needed 18)  Tramadol Hcl 50 Mg Tabs (Tramadol Hcl) .... Take 1 Tablet By Mouth Every 6 Hours As Needed 19)  Lorazepam 0.5 Mg Tabs (Lorazepam) .... 1/2-1 Tab Two Times A Day As Needed Anxiety 20)  Delsym 30 Mg/30ml Lqcr (Dextromethorphan Polistirex) .Marland Kitchen.. 10ml Every 12 Hours As Needed Cough 21)  Oxygen .... 2l/min Via Nasal Cannula Continuously; Increase To 3l/min With Activity  Allergies (verified): 1)  ! Codeine 2)  ! Penicillin 3)  ! * Guafenisin  Past History:  Past Medical History: Reviewed history from 12/29/2009 and no changes required. Congestive heart failure - diastolic COPD History of C Diff colitis Diabetes mellitus, type II Hypertension Hyperlipidemia Anemia-NOS Coronary artery disease (Catheterization April 2006 demonstrated LAD 40% stenosis.  This proximal stent which was patent with 40-50% stenosis.  First diagonal had ostial 60% stenosis, second diagonal was normal.  The circumflex had luminal irregularities.  There was a ramus intermediate with ostial 60% stenosis, mid obtuse marginal had luminal irregularities, the  right coronary artery is dominant with luminal irregularities.  The patient had PTCA of the LAD in 2002) Asystolic arrest  with pacemaker 2002 Renal insufficiency Pulmonary HTN Gallstones  Current Problems:  CORONARY ARTERY DISEASE (ICD-414.00) DIASTOLIC HEART FAILURE, CHRONIC (ICD-428.32) PACEMAKER, PERMANENT; MDT KAPPA (ICD-V45.01) SINOATRIAL NODE DYSFUNCTION (ICD-427.81) SYNCOPE (ICD-780.2) PERCUTANEOUS TRANSLUMINAL CORONARY ANGIOPLASTY,  HX OF (ICD-V45.82) HYPERLIPIDEMIA (ICD-272.4) HYPERTENSION (ICD-401.9) DYSLIPIDEMIA (ICD-272.4) CHRONIC OBSTRUCTIVE PULMONARY DISEASE, ACUTE EXACERBATION (ICD-491.21) COPD (ICD-496) ACUTE BRONCHITIS (ICD-466.0) CHOLELITHIASIS, HX OF (ICD-V12.79) RENAL INSUFFICIENCY (ICD-588.9) ANEMIA-NOS (ICD-285.9) OBESITY (ICD-278.00) * SACRAL ULCERS CLOSTRIDIUM DIFFICILE COLITIS, HX OF DIARRHEA (ICD-V12.79) OSTEOARTHRITIS (ICD-715.90) ANEMIA (ICD-285.9) ALLERGIC RHINITIS (ICD-477.9) STASIS DERMATITIS (ICD-454.1) GOUT (ICD-274.9) DIABETES MELLITUS, TYPE II (ICD-250.00)      Past Surgical History: Reviewed history from 11/30/2007 and no changes required. PERCUTANEOUS TRANSLUMINAL CORONARY ANGIOPLASTY, HX OF (ICD-V45.82) 2002  APPENDECTOMY, HX OF (ICD-V45.79) HYSTERECTOMY, HX OF (ICD-V45.77) PACEMAKER, PERMANENT (ICD-V45.01) 2002  Review of Systems       As stated in the HPI and negative for all other systems.   Vital Signs:  Patient profile:   77 year old female Height:      59 inches Weight:      173 pounds BMI:     35.07 Pulse rate:   82 / minute Resp:     16 per minute BP sitting:   158 / 68  (right arm)  Vitals Entered By: Marrion Coy, CNA (May 27, 2010 9:54 AM)  Physical Exam  General:  Well developed, well nourished, in no acute distress. Head:  normocephalic and atraumatic Mouth:  Edentulous.   Oral mucosa normal. Neck:  Neck supple, no JVD. No masses, thyromegaly or abnormal cervical nodes. Chest Wall:  no deformities or breast masses noted Lungs:  Decreased breath sounds. Abdomen:  Bowel sounds positive; abdomen soft and non-tender without masses, organomegaly, or hernias noted. No hepatosplenomegaly. Msk:  Back normal, normal gait. Muscle strength and tone normal. Extremities:  Mild edema. Neurologic:  Alert and oriented x 3. Skin:  Intact without lesions or rashes. Cervical Nodes:  no significant adenopathy Psych:  Normal affect.   Detailed  Cardiovascular Exam  Neck    Carotids: Carotids full and equal bilaterally without bruits.      Neck Veins: Normal, no JVD.    Heart    Inspection: no deformities or lifts noted.      Palpation: normal PMI with no thrills palpable.      Auscultation: S1 and S2 within normal limits, no S3, 2/6 apical systolic murmur radiating at the aortic outflow tract, no diastolic murmurs  Vascular    Abdominal Aorta: no palpable masses, pulsations, or audible bruits, difficult to evaluate secondary to obesity      Femoral Pulses: normal femoral pulses bilaterally.      Pedal Pulses: bilateral diminished dorsalis pedis and posterior tibialis    Radial Pulses: normal radial pulses bilaterally.      Peripheral Circulation: no clubbing, cyanosis, or edema noted with normal capillary refill.     PPM Specifications Following MD:  Sherryl Manges, MD     PPM Vendor:  [employer]     PPM Model Number:  [id number]     PPM Serial Number:  [id number] PPM DOI:  09/12/2001     PPM Implanting MD:  Sherryl Manges, MD  Lead 1    Location: [facility]     DOI: 09/12/2001     Model #: [id number]     Serial #: [id number]     Status: active Lead 2  Location: [facility]     DOI: 09/12/2001     Model #: [id number]     Serial #: [id number]     Status: active  Magnet Response Rate:  BOL 85 ERI 65  Indications:  Syncope with LBBB   PPM Follow Up Pacer Dependent:  No      Episodes Coumadin:  No  Parameters Mode:  DDD     Lower Rate Limit:  60     Upper Rate Limit:  130 Paced AV Delay:  200     Sensed AV Delay:  140  Impression & Recommendations:  Problem # 1:  CORONARY ARTERY DISEASE (ICD-414.00) She is having no anginal symptoms.  No further work up is needed.  She will continue with risk reduction. EKG w/ Interpretation (93000)  Problem # 2:  DIASTOLIC HEART FAILURE, CHRONIC (ICD-428.32) I have reveiwed all available data.  By this and exam I think that she is euvolemic.  She will remain on the meds as listed.  I ordered a 4 gm diet at her  assisted living facility. Orders: EKG w/ Interpretation (93000)  Patient Instructions: 1)  Your physician recommends that you schedule a follow-up appointment in: 6 months with Dr Antoine Poche 2)  Your physician recommends that you continue on your current medications as directed. Please refer to the Current Medication list given to you today. 3)  Your physician has requested that you limit the intake of sodium (salt) in your diet to four grams daily. Please see [facility] handout.

## 2010-10-21 NOTE — Assessment & Plan Note (Signed)
Summary: Pulmonary OV   Primary Provider/Referring Provider:  Dr. Waymon Amato  CC:  2 month follow up.  increased SOB at times x 2-3 wks.  Wheezing.  Prod cough with thick and white mucus.  .  History of Present Illness: 77yowf quit smoking in 1987  and chronic renal insufficiency with coronary artery disease.  The patient had history of congestive heart failure and diabetes.  The patient has been hospitalized on numerous occasions for respiratory failure and pneumonia.  Patient currently is residing in a nursing care facility.    June 09, 2010 2:43 PM Doing ok.   Still dyspneic with exertion and heavy in the chest area.  Notes some mucus and is white and thick.   Still edematous in the feet.   Is ambulatory .  August 12, 2010 2:16 PM The pt notes more dyspnea esp with exertion. The pt notes more cough.  The mucus is prod white thick.  There is no discolored mucus.  The edema in feet the same, feels more bloated.    Pt denies any significant sore throat, nasal congestion or excess secretions, fever, chills, sweats, unintended weight loss, pleurtic or exertional chest pain, orthopnea PND, or leg swelling Pt denies any increase in rescue therapy over baseline, denies waking up needing it or having any early am or nocturnal exacerbations of coughing/wheezing/or dyspnea.  October 12, 2010 3:30 PM Has good and bad days.  Recently:  notes more dyspnea with any exertion.  Notes some mucus and is thick and grey/white mucus. No real chest pain.  Notes some wheeze.  Notes some edema in feet and legs are with a rash.  No real fever.  Preventive Screening-Counseling & Management  Alcohol-Tobacco     Smoking Status: quit > 6 months  Current Medications (verified): 1)  Pulmicort 0.25 Mg/74ml Susp (Budesonide) .... Four Times Daily 2)  Spiriva Handihaler 18 Mcg  Caps (Tiotropium Bromide Monohydrate) .... Two Puffs in Handihaler Daily 3)  Lantus 100 Unit/ml Soln (Insulin Glargine) .Marland Kitchen.. 15 Untis At  Bedtime 4)  Albuterol Sulfate (2.5 Mg/26ml) 0.083% Nebu (Albuterol Sulfate) .... Administer 6ml Via Neb Four Times Daily 5)  Novolog 100 Unit/ml Soln (Insulin Aspart) .... 6 Units Three Times A Day Before Meals 6)  Senokot S 8.6-50 Mg  Tabs (Sennosides-Docusate Sodium) .... One Tab At Bedtime 7)  Aspir-Low 81 Mg Tbec (Aspirin) .... Once Daily 8)  Diltiazem Hcl Cr 240 Mg Xr24h-Cap (Diltiazem Hcl) .... Take 1 Capsule By Mouth Once A Day 9)  Ferrous Sulfate 325 (65 Fe) Mg Tabs (Ferrous Sulfate) .... Two Times A Day 10)  Demadex 20 Mg Tabs (Torsemide) .... 2 Tablets Two Times A Day 11)  Crestor 20 Mg Tabs (Rosuvastatin Calcium) .... Once Daily 12)  Prilosec 20 Mg Cpdr (Omeprazole) .... Take 1 Tablet By Mouth Once A Day 13)  Enulose 10 Gm/12ml Soln (Lactulose Encephalopathy) .... 30ml By Mouth Once A Day,  Hold For Diarrhea 14)  Allopurinol 100 Mg Tabs (Allopurinol) .... Two Times A Day 15)  Klor-Con M20 20 Meq Cr-Tabs (Potassium Chloride Crys Cr) .Marland Kitchen.. 1 By Mouth Four Times Daily 16)  Tramadol Hcl 50 Mg Tabs (Tramadol Hcl) .... Take 1 Tablet By Mouth Every 6 Hours As Needed 17)  Lorazepam 0.5 Mg Tabs (Lorazepam) .... 1/2-1 Tab Two Times A Day As Needed Anxiety 18)  Delsym 30 Mg/36ml Lqcr (Dextromethorphan Polistirex) .Marland Kitchen.. 10ml Every 12 Hours As Needed Cough 19)  Oxygen .... 2l/min Via Nasal Cannula Continuously; Increase To 3l/min With  Activity 20)  Promethazine Hcl 12.5 Mg Tabs (Promethazine Hcl) .... Take One Every 6 Hours As Needed Nausea 21)  Claritin 10 Mg Tabs (Loratadine) .... Take 1 Tablet By Mouth Once A Day As Needed 22)  Lidoderm 5 % Ptch (Lidocaine) .... Once Daily 23)  Akwa Tears 1.4 % Soln (Polyvinyl Alcohol) .... 2 Drops Each Eye Four Times Daily  Allergies (verified): 1)  ! Codeine 2)  ! Penicillin 3)  ! * Guafenisin  Past History:  Past medical, surgical, family and social histories (including risk factors) reviewed, and no changes noted (except as noted below).  Past Medical  History: Reviewed history from 12/29/2009 and no changes required. Congestive heart failure - diastolic COPD History of C Diff colitis Diabetes mellitus, type II Hypertension Hyperlipidemia Anemia-NOS Coronary artery disease (Catheterization April 2006 demonstrated LAD 40% stenosis.  This proximal stent which was patent with 40-50% stenosis.  First diagonal had ostial 60% stenosis, second diagonal was normal.  The circumflex had luminal irregularities.  There was a ramus intermediate with ostial 60% stenosis, mid obtuse marginal had luminal irregularities, the right coronary artery is dominant with luminal irregularities.  The patient had PTCA of the LAD in 2002) Asystolic arrest  with pacemaker 2002 Renal insufficiency Pulmonary HTN Gallstones  Current Problems:  CORONARY ARTERY DISEASE (ICD-414.00) DIASTOLIC HEART FAILURE, CHRONIC (ICD-428.32) PACEMAKER, PERMANENT; MDT KAPPA (ICD-V45.01) SINOATRIAL NODE DYSFUNCTION (ICD-427.81) SYNCOPE (ICD-780.2) PERCUTANEOUS TRANSLUMINAL CORONARY ANGIOPLASTY, HX OF (ICD-V45.82) HYPERLIPIDEMIA (ICD-272.4) HYPERTENSION (ICD-401.9) DYSLIPIDEMIA (ICD-272.4) CHRONIC OBSTRUCTIVE PULMONARY DISEASE, ACUTE EXACERBATION (ICD-491.21) COPD (ICD-496) ACUTE BRONCHITIS (ICD-466.0) CHOLELITHIASIS, HX OF (ICD-V12.79) RENAL INSUFFICIENCY (ICD-588.9) ANEMIA-NOS (ICD-285.9) OBESITY (ICD-278.00) * SACRAL ULCERS CLOSTRIDIUM DIFFICILE COLITIS, HX OF DIARRHEA (ICD-V12.79) OSTEOARTHRITIS (ICD-715.90) ANEMIA (ICD-285.9) ALLERGIC RHINITIS (ICD-477.9) STASIS DERMATITIS (ICD-454.1) GOUT (ICD-274.9) DIABETES MELLITUS, TYPE II (ICD-250.00)      Past Surgical History: Reviewed history from 11/30/2007 and no changes required. PERCUTANEOUS TRANSLUMINAL CORONARY ANGIOPLASTY, HX OF (ICD-V45.82) 2002  APPENDECTOMY, HX OF (ICD-V45.79) HYSTERECTOMY, HX OF (ICD-V45.77) PACEMAKER, PERMANENT (ICD-V45.01) 2002  Family History: Reviewed history from 10/12/2007 and  no changes required. mother with DM, HTN father with heart disease  Social History: Reviewed history from 06/09/2010 and no changes required. lives at [facility] Former Smoker quit in 1987.  2 ppd x 25 yrs. Alcohol use-no 1 child widowed retired Hospital doctor  Review of Systems       The patient complains of shortness of breath with activity and non-productive cough.  The patient denies shortness of breath at rest, productive cough, coughing up blood, chest pain, irregular heartbeats, acid heartburn, indigestion, loss of appetite, weight change, abdominal pain, difficulty swallowing, sore throat, tooth/dental problems, headaches, nasal congestion/difficulty breathing through nose, sneezing, itching, ear ache, anxiety, depression, hand/feet swelling, joint stiffness or pain, rash, change in color of mucus, and fever.    Vital Signs:  Patient profile:   77 year old female Height:      59 inches Weight:      183.50 pounds BMI:     37.20 O2 Sat:      99 % on 3 L/minpulsed Temp:     98.5 degrees F oral Pulse rate:   80 / minute BP sitting:   142 / 58  (right arm) Cuff size:   large  Vitals Entered By: Gweneth Dimitri RN (October 12, 2010 3:07 PM)  Nutrition Counseling: Patient's BMI is greater than 25 and therefore counseled on weight management options.  O2 Flow:  3 L/minpulsed CC: 2 month follow up.  increased SOB at times  x 2-3 wks.  Wheezing.  Prod cough with thick, white mucus.   Comments Medications reviewed with patient Daytime contact number verified with patient. Gweneth Dimitri RN  October 12, 2010 3:07 PM    Physical Exam  Additional Exam:  Gen: Pleasant, well-nourished, in no distress , normal affect   ENT: no lesions, no post nasal drip Neck: No JVD, no TMG, no carotid bruits Lungs:Coarse BS w/ exp wheeze, poor airflow Cardiovascular: RRR, heart sounds normal, no murmurs or gallops, tr-1 + edema.  Abdomen: soft and non-tender, no HSM, BS  normal Musculoskeletal: No deformities, no cyanosis or clubbing Neuro: alert, non-focal skin: R forearm superficial skin abscess and cellulitis    Impression & Recommendations:  Problem # 1:  CHRONIC OBSTRUCTIVE PULMONARY DISEASE, ACUTE EXACERBATION (ICD-491.21) Assessment Deteriorated copd with acute exacerbation plan Avelox one daily for 5days Prednisone 10mg  4 each am x3days, 3 x 3days, 2 x 3days, 1 x 3days then stop Increase demedex to 60mg  twice a day for 5days then reduce to 40mg  twice a day and stay May use oxygen 2Liter rest and 3 Liter exertion  Medications Added to Medication List This Visit: 1)  Demadex 20 Mg Tabs (Torsemide) .... 2 tablets two times a day 2)  Demadex 20 Mg Tabs (Torsemide) .... 3  tablets two times a day for 5 days then reduce to 2 twice daily 3)  Claritin 10 Mg Tabs (Loratadine) .... Take 1 tablet by mouth once a day as needed 4)  Lidoderm 5 % Ptch (Lidocaine) .... Once daily 5)  Akwa Tears 1.4 % Soln (Polyvinyl alcohol) .... 2 drops each eye four times daily 6)  Avelox 400 Mg Tabs (Moxifloxacin hcl) .... By mouth daily 7)  Prednisone 10 Mg Tabs (Prednisone) .... Take as directed 4 each am x3days, 3 x 3days, 2 x 3days, 1 x 3days then stop  Complete Medication List: 1)  Pulmicort 0.25 Mg/49ml Susp (Budesonide) .... Four times daily 2)  Spiriva Handihaler 18 Mcg Caps (Tiotropium bromide monohydrate) .... Two puffs in handihaler daily 3)  Lantus 100 Unit/ml Soln (Insulin glargine) .Marland Kitchen.. 15 untis at bedtime 4)  Albuterol Sulfate (2.5 Mg/23ml) 0.083% Nebu (Albuterol sulfate) .... Administer 6ml via neb four times daily 5)  Novolog 100 Unit/ml Soln (Insulin aspart) .... 6 units three times a day before meals 6)  Senokot S 8.6-50 Mg Tabs (Sennosides-docusate sodium) .... One tab at bedtime 7)  Aspir-low 81 Mg Tbec (Aspirin) .... Once daily 8)  Diltiazem Hcl Cr 240 Mg Xr24h-cap (Diltiazem hcl) .... Take 1 capsule by mouth once a day 9)  Ferrous Sulfate 325 (65  Fe) Mg Tabs (Ferrous sulfate) .... Two times a day 10)  Demadex 20 Mg Tabs (Torsemide) .... 3  tablets two times a day for 5 days then reduce to 2 twice daily 11)  Crestor 20 Mg Tabs (Rosuvastatin calcium) .... Once daily 12)  Prilosec 20 Mg Cpdr (Omeprazole) .... Take 1 tablet by mouth once a day 13)  Enulose 10 Gm/20ml Soln (Lactulose encephalopathy) .... 30ml by mouth once a day,  hold for diarrhea 14)  Allopurinol 100 Mg Tabs (Allopurinol) .... Two times a day 15)  Klor-con M20 20 Meq Cr-tabs (Potassium chloride crys cr) .Marland Kitchen.. 1 by mouth four times daily 16)  Tramadol Hcl 50 Mg Tabs (Tramadol hcl) .... Take 1 tablet by mouth every 6 hours as needed 17)  Lorazepam 0.5 Mg Tabs (Lorazepam) .... 1/2-1 tab two times a day as needed anxiety 18)  Delsym 30 Mg/34ml  Lqcr (Dextromethorphan polistirex) .Marland Kitchen.. 10ml every 12 hours as needed cough 19)  Oxygen  .... 2l/min via nasal cannula continuously; increase to 3l/min with activity 20)  Promethazine Hcl 12.5 Mg Tabs (Promethazine hcl) .... Take one every 6 hours as needed nausea 21)  Claritin 10 Mg Tabs (Loratadine) .... Take 1 tablet by mouth once a day as needed 22)  Lidoderm 5 % Ptch (Lidocaine) .... Once daily 23)  Akwa Tears 1.4 % Soln (Polyvinyl alcohol) .... 2 drops each eye four times daily 24)  Avelox 400 Mg Tabs (Moxifloxacin hcl) .... By mouth daily 25)  Prednisone 10 Mg Tabs (Prednisone) .... Take as directed 4 each am x3days, 3 x 3days, 2 x 3days, 1 x 3days then stop  Other Orders: Est. Patient Level V (84132)  Patient Instructions: 1)  Avelox one daily for 5days 2)  Prednisone 10mg  4 each am x3days, 3 x 3days, 2 x 3days, 1 x 3days then stop 3)  Increase demedex to 60mg  twice a day for 5days then reduce to 40mg  twice a day and stay 4)  May use oxygen 2Liter rest and 3 Liter exertion 5)  All other medications are the same 6)  Return two months Pulmonary  Prescriptions: PREDNISONE 10 MG  TABS (PREDNISONE) Take as directed 4 each am  x3days, 3 x 3days, 2 x 3days, 1 x 3days then stop  #30 x 0   Entered and Authorized by:   Storm Frisk MD   Signed by:   Storm Frisk MD on 10/12/2010   Method used:   Print then Give to Patient   RxID:   [id number] AVELOX 400 MG  TABS (MOXIFLOXACIN HCL) By mouth daily  #5 x 0   Entered and Authorized by:   Storm Frisk MD   Signed by:   Storm Frisk MD on 10/12/2010   Method used:   Print then Give to Patient   RxID:   [id number]

## 2010-11-26 LAB — GLUCOSE, CAPILLARY
Glucose-Capillary: 107 mg/dL — ABNORMAL HIGH (ref 70–99)
Glucose-Capillary: 112 mg/dL — ABNORMAL HIGH (ref 70–99)
Glucose-Capillary: 127 mg/dL — ABNORMAL HIGH (ref 70–99)
Glucose-Capillary: 143 mg/dL — ABNORMAL HIGH (ref 70–99)
Glucose-Capillary: 155 mg/dL — ABNORMAL HIGH (ref 70–99)
Glucose-Capillary: 156 mg/dL — ABNORMAL HIGH (ref 70–99)
Glucose-Capillary: 161 mg/dL — ABNORMAL HIGH (ref 70–99)
Glucose-Capillary: 166 mg/dL — ABNORMAL HIGH (ref 70–99)
Glucose-Capillary: 232 mg/dL — ABNORMAL HIGH (ref 70–99)
Glucose-Capillary: 244 mg/dL — ABNORMAL HIGH (ref 70–99)
Glucose-Capillary: 290 mg/dL — ABNORMAL HIGH (ref 70–99)
Glucose-Capillary: 308 mg/dL — ABNORMAL HIGH (ref 70–99)
Glucose-Capillary: 331 mg/dL — ABNORMAL HIGH (ref 70–99)
Glucose-Capillary: 42 mg/dL — CL (ref 70–99)

## 2010-11-26 LAB — CBC
MCH: 28.4 pg (ref 26.0–34.0)
MCH: 28.6 pg (ref 26.0–34.0)
MCHC: 32.8 g/dL (ref 30.0–36.0)
MCV: 84.7 fL (ref 78.0–100.0)
MCV: 86.8 fL (ref 78.0–100.0)
Platelets: 155 10*3/uL (ref 150–400)
Platelets: 175 10*3/uL (ref 150–400)
RDW: 16.9 % — ABNORMAL HIGH (ref 11.5–15.5)
RDW: 17.6 % — ABNORMAL HIGH (ref 11.5–15.5)
WBC: 18.5 10*3/uL — ABNORMAL HIGH (ref 4.0–10.5)

## 2010-11-26 LAB — BASIC METABOLIC PANEL
BUN: 31 mg/dL — ABNORMAL HIGH (ref 6–23)
BUN: 55 mg/dL — ABNORMAL HIGH (ref 6–23)
BUN: 73 mg/dL — ABNORMAL HIGH (ref 6–23)
CO2: 25 mEq/L (ref 19–32)
CO2: 26 mEq/L (ref 19–32)
CO2: 29 mEq/L (ref 19–32)
Calcium: 8.4 mg/dL (ref 8.4–10.5)
Calcium: 8.6 mg/dL (ref 8.4–10.5)
Chloride: 108 mEq/L (ref 96–112)
Chloride: 114 mEq/L — ABNORMAL HIGH (ref 96–112)
Chloride: 99 mEq/L (ref 96–112)
Creatinine, Ser: 1.39 mg/dL — ABNORMAL HIGH (ref 0.4–1.2)
Creatinine, Ser: 1.61 mg/dL — ABNORMAL HIGH (ref 0.4–1.2)
Creatinine, Ser: 2.29 mg/dL — ABNORMAL HIGH (ref 0.4–1.2)
GFR calc Af Amer: 34 mL/min — ABNORMAL LOW (ref >60)
GFR calc Af Amer: 37 mL/min — ABNORMAL LOW (ref >60)
GFR calc Af Amer: 44 mL/min — ABNORMAL LOW (ref >60)
GFR calc non Af Amer: 28 mL/min — ABNORMAL LOW (ref >60)
Glucose, Bld: 149 mg/dL — ABNORMAL HIGH (ref 70–99)
Glucose, Bld: 212 mg/dL — ABNORMAL HIGH (ref 70–99)
Potassium: 3.3 mEq/L — ABNORMAL LOW (ref 3.5–5.1)
Sodium: 147 mEq/L — ABNORMAL HIGH (ref 135–145)

## 2010-11-26 LAB — CARDIAC PANEL(CRET KIN+CKTOT+MB+TROPI)
CK, MB: 2.2 ng/mL (ref 0.3–4.0)
CK, MB: 2.4 ng/mL (ref 0.3–4.0)
CK, MB: 3 ng/mL (ref 0.3–4.0)
CK, MB: 3.1 ng/mL (ref 0.3–4.0)
CK, MB: 3.5 ng/mL (ref 0.3–4.0)
Relative Index: INVALID (ref 0.0–2.5)
Relative Index: INVALID (ref 0.0–2.5)
Total CK: 27 U/L (ref 7–177)
Total CK: 36 U/L (ref 7–177)
Total CK: 42 U/L (ref 7–177)
Total CK: 50 U/L (ref 7–177)
Total CK: 50 U/L (ref 7–177)
Troponin I: 0.06 ng/mL (ref 0.00–0.06)

## 2010-11-26 LAB — HEMOCCULT GUIAC POC 1CARD (OFFICE): Fecal Occult Bld: NEGATIVE

## 2010-11-26 LAB — URINALYSIS, MICROSCOPIC ONLY
Ketones, ur: NEGATIVE mg/dL
Leukocytes, UA: NEGATIVE
Nitrite: NEGATIVE
pH: 5 (ref 5.0–8.0)

## 2010-11-26 LAB — DIFFERENTIAL
Basophils Relative: 1 % (ref 0–1)
Eosinophils Absolute: 0.2 10*3/uL (ref 0.0–0.7)
Eosinophils Relative: 1 % (ref 0–5)
Neutrophils Relative %: 68 % (ref 43–77)

## 2010-11-27 LAB — COMPREHENSIVE METABOLIC PANEL
ALT: 14 U/L (ref 0–35)
AST: 17 U/L (ref 0–37)
Alkaline Phosphatase: 60 U/L (ref 39–117)
CO2: 32 mEq/L (ref 19–32)
Chloride: 100 mEq/L (ref 96–112)
GFR calc Af Amer: 27 mL/min — ABNORMAL LOW (ref >60)
GFR calc non Af Amer: 22 mL/min — ABNORMAL LOW (ref >60)
Glucose, Bld: 167 mg/dL — ABNORMAL HIGH (ref 70–99)
Potassium: 3.1 mEq/L — ABNORMAL LOW (ref 3.5–5.1)
Sodium: 141 mEq/L (ref 135–145)
Total Bilirubin: 0.5 mg/dL (ref 0.3–1.2)

## 2010-11-27 LAB — AMYLASE: Amylase: 72 U/L (ref 0–105)

## 2010-11-27 LAB — CBC
HCT: 31.9 % — ABNORMAL LOW (ref 36.0–46.0)
Hemoglobin: 10.7 g/dL — ABNORMAL LOW (ref 12.0–15.0)
MCHC: 33.6 g/dL (ref 30.0–36.0)
RBC: 3.8 MIL/uL — ABNORMAL LOW (ref 3.87–5.11)

## 2010-11-27 LAB — LIPASE, BLOOD: Lipase: 28 U/L (ref 11–59)

## 2010-11-27 LAB — GLUCOSE, CAPILLARY: Glucose-Capillary: 263 mg/dL — ABNORMAL HIGH (ref 70–99)

## 2010-11-30 ENCOUNTER — Ambulatory Visit (INDEPENDENT_AMBULATORY_CARE_PROVIDER_SITE_OTHER): Payer: Medicare Other | Admitting: Cardiology

## 2010-11-30 ENCOUNTER — Encounter: Payer: Self-pay | Admitting: Cardiology

## 2010-11-30 DIAGNOSIS — I2510 Atherosclerotic heart disease of native coronary artery without angina pectoris: Secondary | ICD-10-CM

## 2010-11-30 DIAGNOSIS — R609 Edema, unspecified: Secondary | ICD-10-CM

## 2010-12-02 ENCOUNTER — Telehealth: Payer: Self-pay | Admitting: Cardiology

## 2010-12-02 NOTE — Telephone Encounter (Signed)
Spoke with Hope and clarified Demadex and potassium chloride orders.  She repeated orders correctly and had no other questions.

## 2010-12-02 NOTE — Telephone Encounter (Signed)
Hope with [facility] calling re new orders needs to clarify from visit monday

## 2010-12-08 ENCOUNTER — Encounter: Payer: Self-pay | Admitting: Cardiology

## 2010-12-10 NOTE — Assessment & Plan Note (Signed)
Summary: 6 month rov/ 428.32/ per chris. mj/sp  Medications Added ACIDOPHILUS  CAPS (LACTOBACILLUS) 1 tab by mouth for 14 days LEVAQUIN 500 MG TABS (LEVOFLOXACIN) 1 tab by mouth  for 10 days      Allergies Added:   Visit Type:  Follow-up Primary Provider:  Dr. Waymon Amato  CC:  CAD.  History of Present Illness: The patient is well known to me presents for followup of coronary disease and diastolic heart failure. Her biggest complaint however is cough productive of yellow sputum. He has dyspnea with this. She was treated apparently with a 10 day course of Levaquin but says she's had no improvement in this. She does get around minimally with a walker but also rides a wheelchair. She has still been able to do this. She sleeps with her head elevated and this apparently has not changed. She describes some chronic chest pressure but is not describing any discomfort for which she thinks she needs and nitroglycerin. She doesn't wear compression stockings. She does report that she keeps her feet elevated but is complaining of increasing lower extremity swelling. She does have chronic venous stasis changes with redness in both her legs that did not improve with her course of antibiotics. She reports feeling cold but doesn't report fevers or shaking chills.  Current Medications (verified): 1)  Pulmicort 0.25 Mg/67ml Susp (Budesonide) .... Four Times Daily 2)  Spiriva Handihaler 18 Mcg  Caps (Tiotropium Bromide Monohydrate) .... Two Puffs in Handihaler Daily 3)  Lantus 100 Unit/ml Soln (Insulin Glargine) .Marland Kitchen.. 15 Untis At Bedtime 4)  Albuterol Sulfate (2.5 Mg/86ml) 0.083% Nebu (Albuterol Sulfate) .... Administer 6ml Via Neb Four Times Daily 5)  Novolog 100 Unit/ml Soln (Insulin Aspart) .... 6 Units Three Times A Day Before Meals 6)  Senokot S 8.6-50 Mg  Tabs (Sennosides-Docusate Sodium) .... One Tab At Bedtime 7)  Aspir-Low 81 Mg Tbec (Aspirin) .... Once Daily 8)  Diltiazem Hcl Cr 240 Mg Xr24h-Cap (Diltiazem  Hcl) .... Take 1 Capsule By Mouth Once A Day 9)  Ferrous Sulfate 325 (65 Fe) Mg Tabs (Ferrous Sulfate) .... Two Times A Day 10)  Demadex 20 Mg Tabs (Torsemide) .... 3  Tablets Two Times A Day For 5 Days Then Reduce To 2 Twice Daily 11)  Crestor 20 Mg Tabs (Rosuvastatin Calcium) .... Once Daily 12)  Prilosec 20 Mg Cpdr (Omeprazole) .... Take 1 Tablet By Mouth Once A Day 13)  Enulose 10 Gm/50ml Soln (Lactulose Encephalopathy) .... 30ml By Mouth Once A Day,  Hold For Diarrhea 14)  Allopurinol 100 Mg Tabs (Allopurinol) .... Two Times A Day 15)  Klor-Con M20 20 Meq Cr-Tabs (Potassium Chloride Crys Cr) .Marland Kitchen.. 1 By Mouth Four Times Daily 16)  Tramadol Hcl 50 Mg Tabs (Tramadol Hcl) .... Take 1 Tablet By Mouth Every 6 Hours As Needed 17)  Lorazepam 0.5 Mg Tabs (Lorazepam) .... 1/2-1 Tab Two Times A Day As Needed Anxiety 18)  Delsym 30 Mg/59ml Lqcr (Dextromethorphan Polistirex) .Marland Kitchen.. 10ml Every 12 Hours As Needed Cough 19)  Oxygen .... 2l/min Via Nasal Cannula Continuously; Increase To 3l/min With Activity 20)  Promethazine Hcl 12.5 Mg Tabs (Promethazine Hcl) .... Take One Every 6 Hours As Needed Nausea 21)  Claritin 10 Mg Tabs (Loratadine) .... Take 1 Tablet By Mouth Once A Day As Needed 22)  Lidoderm 5 % Ptch (Lidocaine) .... Once Daily 23)  Akwa Tears 1.4 % Soln (Polyvinyl Alcohol) .... 2 Drops Each Eye Four Times Daily 24)  Acidophilus  Caps (Lactobacillus) .Marland KitchenMarland KitchenMarland Kitchen 1  Tab By Mouth For 14 Days 25)  Levaquin 500 Mg Tabs (Levofloxacin) .Marland Kitchen.. 1 Tab By Mouth  For 10 Days  Allergies (verified): 1)  ! Codeine 2)  ! Penicillin 3)  ! * Guafenisin  Past History:  Past Medical History: Reviewed history from 12/29/2009 and no changes required. Congestive heart failure - diastolic COPD History of C Diff colitis Diabetes mellitus, type II Hypertension Hyperlipidemia Anemia-NOS Coronary artery disease (Catheterization April 2006 demonstrated LAD 40% stenosis.  This proximal stent which was patent with 40-50%  stenosis.  First diagonal had ostial 60% stenosis, second diagonal was normal.  The circumflex had luminal irregularities.  There was a ramus intermediate with ostial 60% stenosis, mid obtuse marginal had luminal irregularities, the right coronary artery is dominant with luminal irregularities.  The patient had PTCA of the LAD in 2002) Asystolic arrest  with pacemaker 2002 Renal insufficiency Pulmonary HTN Gallstones  Current Problems:  CORONARY ARTERY DISEASE (ICD-414.00) DIASTOLIC HEART FAILURE, CHRONIC (ICD-428.32) PACEMAKER, PERMANENT; MDT KAPPA (ICD-V45.01) SINOATRIAL NODE DYSFUNCTION (ICD-427.81) SYNCOPE (ICD-780.2) PERCUTANEOUS TRANSLUMINAL CORONARY ANGIOPLASTY, HX OF (ICD-V45.82) HYPERLIPIDEMIA (ICD-272.4) HYPERTENSION (ICD-401.9) DYSLIPIDEMIA (ICD-272.4) CHRONIC OBSTRUCTIVE PULMONARY DISEASE, ACUTE EXACERBATION (ICD-491.21) COPD (ICD-496) ACUTE BRONCHITIS (ICD-466.0) CHOLELITHIASIS, HX OF (ICD-V12.79) RENAL INSUFFICIENCY (ICD-588.9) ANEMIA-NOS (ICD-285.9) OBESITY (ICD-278.00) * SACRAL ULCERS CLOSTRIDIUM DIFFICILE COLITIS, HX OF DIARRHEA (ICD-V12.79) OSTEOARTHRITIS (ICD-715.90) ANEMIA (ICD-285.9) ALLERGIC RHINITIS (ICD-477.9) STASIS DERMATITIS (ICD-454.1) GOUT (ICD-274.9) DIABETES MELLITUS, TYPE II (ICD-250.00)      Past Surgical History: Reviewed history from 11/30/2007 and no changes required. PERCUTANEOUS TRANSLUMINAL CORONARY ANGIOPLASTY, HX OF (ICD-V45.82) 2002  APPENDECTOMY, HX OF (ICD-V45.79) HYSTERECTOMY, HX OF (ICD-V45.77) PACEMAKER, PERMANENT (ICD-V45.01) 2002  Review of Systems       As stated in the HPI and negative for all other systems.   Vital Signs:  Patient profile:   77 year old female Height:      59 inches Weight:      185 pounds BMI:     37.50 Pulse rate:   82 / minute Resp:     19 per minute BP sitting:   112 / 80  (left arm) Cuff size:   large  Vitals Entered By: Celestia Khat, CMA (November 30, 2010 10:17 AM)  Physical  Exam  General:  Well developed, well nourished, in no acute distress, coughing paroxysms Head:  normocephalic and atraumatic Mouth:  Edentulous.   Oral mucosa normal. Neck:  Neck supple, no JVD. No masses, thyromegaly or abnormal cervical nodes. Chest Wall:  no deformities Lungs:  Coarse crackles, no fine crackles, positive expiratory wheezing Abdomen:  Positive bowel sounds,, no rebound, no guarding, unable to appreciate organomegaly patient seated Extremities:  moderate to severe lower extremity edema to the knees with chronic venous stasis changes and pretibial erythema Neurologic:  Alert and oriented x 3. Cervical Nodes:  no significant adenopathy Psych:  Normal affect.   Detailed Cardiovascular Exam  Neck    Carotids: Carotids full and equal bilaterally without bruits.      Neck Veins: Normal, no JVD at 90.    Heart    Auscultation: S1 and S2 within normal limits, no S3, 2/6 apical systolic murmur radiating at the aortic outflow tract, no diastolic murmurs  Vascular    Abdominal Aorta: unable to appreciate bruits secondary to obesity and the patient seated    Femoral Pulses: unable to appreciate with the patient seated    Pedal Pulses: bilateral diminished dorsalis pedis and posterior tibialis    Radial Pulses: normal radial pulses bilaterally.  Peripheral Circulation: no clubbing, cyanosis   EKG  Procedure date:  11/30/2010  Findings:      Sinus rhythm, low voltage limb leads, left axis deviation, left bundle branch block  PPM Specifications Following MD:  Sherryl Manges, MD     PPM Vendor:  [employer]     PPM Model Number:  901     PPM Serial Number:  [id number] PPM DOI:  09/12/2001     PPM Implanting MD:  Sherryl Manges, MD  Lead 1    Location: [facility]     DOI: 09/12/2001     Model #: [id number]     Serial #: [id number]     Status: active Lead 2    Location: [facility]     DOI: 09/12/2001     Model #: [id number]     Serial #: [id number]     Status: active  Magnet Response Rate:  BOL  85 ERI 65  Indications:  Syncope with LBBB   PPM Follow Up Pacer Dependent:  No      Episodes Coumadin:  No  Parameters Mode:  DDD     Lower Rate Limit:  60     Upper Rate Limit:  130 Paced AV Delay:  200     Sensed AV Delay:  140  Impression & Recommendations:  Problem # 1:  DIASTOLIC HEART FAILURE, CHRONIC (ICD-428.32)  I don't think her symptoms are consistent with diastolic heart failure. I'm giving her diuretics when the edema and in about 6 days when I check labs I will check a BNP. This has been very normal in the past.  Her updated medication list for this problem includes:    Aspir-low 81 Mg Tbec (Aspirin) ..... Once daily    Diltiazem Hcl Cr 240 Mg Xr24h-cap (Diltiazem hcl) .Marland Kitchen... Take 1 capsule by mouth once a day    Demadex 20 Mg Tabs (Torsemide) .Marland KitchenMarland KitchenMarland KitchenMarland Kitchen 3  tablets two times a day for 5 days then reduce to 2 twice daily  Problem # 2:  CORONARY ARTERY DISEASE (ICD-414.00)  She is having no new chest pain. I am not planning further cardiovascular testing at this point. Orders: EKG w/ Interpretation (93000)  Her updated medication list for this problem includes:    Aspir-low 81 Mg Tbec (Aspirin) ..... Once daily    Diltiazem Hcl Cr 240 Mg Xr24h-cap (Diltiazem hcl) .Marland Kitchen... Take 1 capsule by mouth once a day  Problem # 3:  RENAL INSUFFICIENCY (ICD-588.9) I will check a basic metabolic profile in 6 days  Problem # 4:  CHRONIC OBSTRUCTIVE PULMONARY DISEASE, ACUTE EXACERBATION (ICD-491.21)  I'll try to get her into see Dr. Delford Field as soon as possible as she may need a change in antibiotics or steroids  Her updated medication list for this problem includes:    Pulmicort 0.25 Mg/90ml Susp (Budesonide) .Marland Kitchen... Four times daily    Spiriva Handihaler 18 Mcg Caps (Tiotropium bromide monohydrate) .Marland Kitchen..Marland Kitchen Two puffs in handihaler daily    Albuterol Sulfate (2.5 Mg/20ml) 0.083% Nebu (Albuterol sulfate) .Marland Kitchen... Administer 6ml via neb four times daily    Levaquin 500 Mg Tabs (Levofloxacin)  .Marland Kitchen... 1 tab by mouth  for 10 days  Problem # 5:  EDEMA (ICD-782.3) I will give her an extra 20 b.i.d. and Demadex for the next 4 days with an extra 40 mEq of potassium. She will get a basic metabolic profile in 6 days.  Patient Instructions: 1)  Your physician recommends that you schedule a follow-up appointment in: 6 weeks with Dr  Acea Yagi 2)  Your physician recommends that you have lab work in:  6 days BMP and CBC 414.01  v58.69 3)  Your physician has recommended you make the following change in your medication: Increase Demedex another 20 mg two times a day for 4 days and Potassium Chloride 40 mEQ for 4 days. 4)  You have been referred to Dr Delford Field for continued  cough and wheezing.    Colorectal Screening   Hemoccult: Not documented    Colonoscopy: normal  (10/18/2006)

## 2010-12-14 LAB — POCT I-STAT 3, ART BLOOD GAS (G3+)
Acid-Base Excess: 2 mmol/L (ref 0.0–2.0)
O2 Saturation: 98 %
TCO2: 28 mmol/L (ref 0–100)
pCO2 arterial: 38.5 mmHg (ref 35.0–45.0)

## 2010-12-14 LAB — CBC
HCT: 31.7 % — ABNORMAL LOW (ref 36.0–46.0)
Hemoglobin: 10.7 g/dL — ABNORMAL LOW (ref 12.0–15.0)
MCHC: 33.7 g/dL (ref 30.0–36.0)
RBC: 3.75 MIL/uL — ABNORMAL LOW (ref 3.87–5.11)

## 2010-12-14 LAB — POCT CARDIAC MARKERS
Myoglobin, poc: >500 ng/mL (ref 12–200)
Troponin i, poc: <0.05 ng/mL (ref 0.00–0.09)
Troponin i, poc: <0.05 ng/mL (ref 0.00–0.09)

## 2010-12-14 LAB — BASIC METABOLIC PANEL
CO2: 27 mEq/L (ref 19–32)
Calcium: 9.8 mg/dL (ref 8.4–10.5)
Chloride: 105 mEq/L (ref 96–112)
GFR calc Af Amer: 27 mL/min — ABNORMAL LOW (ref >60)
Glucose, Bld: 144 mg/dL — ABNORMAL HIGH (ref 70–99)
Potassium: 3.9 mEq/L (ref 3.5–5.1)
Sodium: 142 mEq/L (ref 135–145)

## 2010-12-14 LAB — DIFFERENTIAL
Basophils Relative: 0 % (ref 0–1)
Eosinophils Relative: 0 % (ref 0–5)
Monocytes Absolute: 1.2 10*3/uL — ABNORMAL HIGH (ref 0.1–1.0)
Monocytes Relative: 7 % (ref 3–12)
Neutro Abs: 11.9 10*3/uL — ABNORMAL HIGH (ref 1.7–7.7)

## 2010-12-16 LAB — BASIC METABOLIC PANEL
BUN: 46 mg/dL — ABNORMAL HIGH (ref 6–23)
BUN: 55 mg/dL — ABNORMAL HIGH (ref 6–23)
BUN: 59 mg/dL — ABNORMAL HIGH (ref 6–23)
CO2: 33 mEq/L — ABNORMAL HIGH (ref 19–32)
Chloride: 105 mEq/L (ref 96–112)
Creatinine, Ser: 1.61 mg/dL — ABNORMAL HIGH (ref 0.4–1.2)
Creatinine, Ser: 1.79 mg/dL — ABNORMAL HIGH (ref 0.4–1.2)
GFR calc non Af Amer: 25 mL/min — ABNORMAL LOW (ref >60)
GFR calc non Af Amer: 27 mL/min — ABNORMAL LOW (ref >60)
Potassium: 4.7 mEq/L (ref 3.5–5.1)
Sodium: 143 mEq/L (ref 135–145)

## 2010-12-16 LAB — GLUCOSE, CAPILLARY
Glucose-Capillary: 149 mg/dL — ABNORMAL HIGH (ref 70–99)
Glucose-Capillary: 191 mg/dL — ABNORMAL HIGH (ref 70–99)
Glucose-Capillary: 241 mg/dL — ABNORMAL HIGH (ref 70–99)
Glucose-Capillary: 263 mg/dL — ABNORMAL HIGH (ref 70–99)
Glucose-Capillary: 270 mg/dL — ABNORMAL HIGH (ref 70–99)
Glucose-Capillary: 274 mg/dL — ABNORMAL HIGH (ref 70–99)
Glucose-Capillary: 328 mg/dL — ABNORMAL HIGH (ref 70–99)
Glucose-Capillary: 93 mg/dL (ref 70–99)

## 2010-12-16 LAB — EXPECTORATED SPUTUM ASSESSMENT W GRAM STAIN, RFLX TO RESP C

## 2010-12-17 LAB — COMPREHENSIVE METABOLIC PANEL
ALT: 30 U/L (ref 0–35)
Albumin: 3.7 g/dL (ref 3.5–5.2)
Alkaline Phosphatase: 45 U/L (ref 39–117)
Calcium: 9.8 mg/dL (ref 8.4–10.5)
Potassium: 4.6 mEq/L (ref 3.5–5.1)
Sodium: 140 mEq/L (ref 135–145)
Total Protein: 7 g/dL (ref 6.0–8.3)

## 2010-12-17 LAB — GLUCOSE, CAPILLARY
Glucose-Capillary: 102 mg/dL — ABNORMAL HIGH (ref 70–99)
Glucose-Capillary: 108 mg/dL — ABNORMAL HIGH (ref 70–99)
Glucose-Capillary: 153 mg/dL — ABNORMAL HIGH (ref 70–99)
Glucose-Capillary: 162 mg/dL — ABNORMAL HIGH (ref 70–99)
Glucose-Capillary: 176 mg/dL — ABNORMAL HIGH (ref 70–99)
Glucose-Capillary: 247 mg/dL — ABNORMAL HIGH (ref 70–99)
Glucose-Capillary: 273 mg/dL — ABNORMAL HIGH (ref 70–99)
Glucose-Capillary: 282 mg/dL — ABNORMAL HIGH (ref 70–99)

## 2010-12-17 LAB — BASIC METABOLIC PANEL
BUN: 39 mg/dL — ABNORMAL HIGH (ref 6–23)
BUN: 50 mg/dL — ABNORMAL HIGH (ref 6–23)
CO2: 37 mEq/L — ABNORMAL HIGH (ref 19–32)
Calcium: 9.6 mg/dL (ref 8.4–10.5)
Calcium: 9.6 mg/dL (ref 8.4–10.5)
Calcium: 9.8 mg/dL (ref 8.4–10.5)
Chloride: 100 mEq/L (ref 96–112)
Chloride: 102 mEq/L (ref 96–112)
Creatinine, Ser: 1.75 mg/dL — ABNORMAL HIGH (ref 0.4–1.2)
Creatinine, Ser: 1.75 mg/dL — ABNORMAL HIGH (ref 0.4–1.2)
Creatinine, Ser: 1.82 mg/dL — ABNORMAL HIGH (ref 0.4–1.2)
GFR calc Af Amer: 37 mL/min — ABNORMAL LOW (ref >60)
GFR calc non Af Amer: 27 mL/min — ABNORMAL LOW (ref >60)
GFR calc non Af Amer: 28 mL/min — ABNORMAL LOW (ref >60)
Glucose, Bld: 159 mg/dL — ABNORMAL HIGH (ref 70–99)
Glucose, Bld: 211 mg/dL — ABNORMAL HIGH (ref 70–99)
Potassium: 4.4 mEq/L (ref 3.5–5.1)
Potassium: 5.5 mEq/L — ABNORMAL HIGH (ref 3.5–5.1)
Sodium: 140 mEq/L (ref 135–145)
Sodium: 142 mEq/L (ref 135–145)

## 2010-12-17 LAB — CBC
HCT: 34.9 % — ABNORMAL LOW (ref 36.0–46.0)
MCHC: 33.4 g/dL (ref 30.0–36.0)
MCV: 84 fL (ref 78.0–100.0)
Platelets: 199 10*3/uL (ref 150–400)
RBC: 4.23 MIL/uL (ref 3.87–5.11)
RDW: 16.8 % — ABNORMAL HIGH (ref 11.5–15.5)

## 2010-12-17 LAB — DIFFERENTIAL
Basophils Relative: 0 % (ref 0–1)
Eosinophils Absolute: 0.1 10*3/uL (ref 0.0–0.7)
Lymphs Abs: 1.9 10*3/uL (ref 0.7–4.0)
Monocytes Absolute: 0.3 10*3/uL (ref 0.1–1.0)
Monocytes Relative: 2 % — ABNORMAL LOW (ref 3–12)
Neutro Abs: 9.4 10*3/uL — ABNORMAL HIGH (ref 1.7–7.7)

## 2010-12-17 LAB — BRAIN NATRIURETIC PEPTIDE
Pro B Natriuretic peptide (BNP): 54 pg/mL (ref 0.0–100.0)
Pro B Natriuretic peptide (BNP): 64 pg/mL (ref 0.0–100.0)

## 2010-12-17 LAB — CARDIAC PANEL(CRET KIN+CKTOT+MB+TROPI)
CK, MB: 3 ng/mL (ref 0.3–4.0)
CK, MB: 3.4 ng/mL (ref 0.3–4.0)
Total CK: 143 U/L (ref 7–177)
Troponin I: 0.03 ng/mL (ref 0.00–0.06)

## 2010-12-17 LAB — CK TOTAL AND CKMB (NOT AT ARMC)
CK, MB: 3.2 ng/mL (ref 0.3–4.0)
Relative Index: 1.9 (ref 0.0–2.5)
Total CK: 166 U/L (ref 7–177)

## 2010-12-17 LAB — TROPONIN I: Troponin I: 0.02 ng/mL (ref 0.00–0.06)

## 2010-12-17 LAB — PROTIME-INR: INR: 0.98 (ref 0.00–1.49)

## 2010-12-28 ENCOUNTER — Encounter: Payer: Self-pay | Admitting: Cardiology

## 2010-12-30 ENCOUNTER — Encounter: Payer: Self-pay | Admitting: Critical Care Medicine

## 2010-12-30 ENCOUNTER — Ambulatory Visit (INDEPENDENT_AMBULATORY_CARE_PROVIDER_SITE_OTHER): Payer: Medicare Other | Admitting: Critical Care Medicine

## 2010-12-30 DIAGNOSIS — I8310 Varicose veins of unspecified lower extremity with inflammation: Secondary | ICD-10-CM

## 2010-12-30 DIAGNOSIS — J441 Chronic obstructive pulmonary disease with (acute) exacerbation: Secondary | ICD-10-CM

## 2010-12-30 MED ORDER — SULFAMETHOXAZOLE-TRIMETHOPRIM 800-160 MG PO TABS: 1.0000 | ORAL_TABLET | Freq: Two times a day (BID) | ORAL | Status: AC

## 2010-12-30 MED ORDER — PREDNISONE 10 MG PO TABS: ORAL_TABLET | ORAL | Status: DC

## 2010-12-30 NOTE — Assessment & Plan Note (Signed)
Acute tracheobronchitis and cellulitis Plan BactrimDS x 10days bid Pulse prednisone

## 2010-12-30 NOTE — Patient Instructions (Signed)
Bactrim DS one twice daily for 10days Prednisone 10mg  Take 4 for three days, 3 for three days, 2 for three days, 1 for three days and stop No other medication changes Return 1 month

## 2010-12-30 NOTE — Progress Notes (Signed)
Subjective:    Patient ID: [name], female    DOB: 08/07/1928, 77 y.o.   MRN: [id number]  HPI  [id number] quit smoking in 1987 and chronic renal insufficiency with coronary artery disease. The patient had history of congestive heart failure and diabetes. The patient has been hospitalized on numerous occasions for respiratory failure and pneumonia. Patient currently is residing in a nursing care facility.  June 09, 2010 2:43 PM  Doing ok. Still dyspneic with exertion and heavy in the chest area. Notes some mucus and is white and thick. Still edematous in the feet. Is ambulatory . August 12, 2010 2:16 PM  The pt notes more dyspnea esp with exertion. The pt notes more cough. The mucus is prod white thick. There is no discolored mucus. The edema in feet the same, feels more bloated.  Pt denies any significant sore throat, nasal congestion or excess secretions, fever, chills, sweats, unintended weight loss, pleurtic or exertional chest pain, orthopnea PND, or leg swelling  Pt denies any increase in rescue therapy over baseline, denies waking up needing it or having any early am or nocturnal exacerbations of coughing/wheezing/or dyspnea.  October 12, 2010 3:30 PM  Has good and bad days. Recently: notes more dyspnea with any exertion. Notes some mucus and is thick and grey/white mucus.  No real chest pain. Notes some wheeze. Notes some edema in feet and legs are with a rash. No real fever.   12/30/2010 Pt feels worse, sleeps all the time, no energy, unable to walk and feels heavy. Notes more mucus.  Mucus is white and thick and yellow.   No fever.  No chest pain.  Past Medical History  Diagnosis Date  . COPD (chronic obstructive pulmonary disease)   . Diastolic congestive heart failure   . C. difficile colitis   . Hypertension   . Hyperlipidemia   . CAD (coronary artery disease)   . Anemia   . Type 2 diabetes mellitus   . Pacemaker 2002  . Cardiac arrest - asystole 2002  . Gallstones    . Pulmonary hypertension   . Renal insufficiency   . Osteoarthritis   . Allergic rhinitis   . Gout      Family History  Problem Relation Age of Onset  . Diabetes Mother   . Hypertension Mother   . Heart disease Father      History   Social History  . Marital Status: Widowed    Spouse Name: N/A    Number of Children: N/A  . Years of Education: N/A   Occupational History  . Retired     Hospital doctor   Social History Main Topics  . Smoking status: Former Smoker -- 2.0 packs/day for 25 years    Types: Cigarettes    Quit date: 09/13/1985  . Smokeless tobacco: Never Used  . Alcohol Use: No  . Drug Use: Not on file  . Sexually Active: Not on file   Other Topics Concern  . Not on file   Social History Narrative   Resides at [facility]     Allergies  Allergen Reactions  . Codeine     REACTION: unspecified  . Penicillins     REACTION: unspecified     Outpatient Prescriptions Prior to Visit  Medication Sig Dispense Refill  . albuterol (PROVENTIL) (2.5 MG/3ML) 0.083% nebulizer solution Take 2.5 mg by nebulization 4 (four) times daily.        Marland Kitchen allopurinol (ZYLOPRIM) 100 MG tablet Take 100 mg  by mouth 2 (two) times daily.        Marland Kitchen aspirin 81 MG tablet Take 81 mg by mouth daily.        . budesonide (PULMICORT) 0.25 MG/2ML nebulizer solution Take 0.25 mg by nebulization 4 (four) times daily.        Marland Kitchen dextromethorphan (DELSYM) 30 MG/5ML liquid Take 60 mg by mouth every 12 (twelve) hours as needed.        . diltiazem (DILACOR XR) 240 MG 24 hr capsule Take 240 mg by mouth daily.        . ferrous sulfate 325 (65 FE) MG tablet Take 325 mg by mouth 2 (two) times daily.        . insulin glargine (LANTUS) 100 UNIT/ML injection Inject 15 Units into the skin at bedtime.        Marland Kitchen lactulose (CHRONULAC) 10 GM/15ML solution 30 mls by mouth daily- hold for diarrhea       . lidocaine (LIDODERM) 5 % Place 1 patch onto the skin daily. Remove & Discard patch within 12 hours or as  directed by MD       . loratadine (CLARITIN) 10 MG tablet Take 10 mg by mouth daily as needed.        Marland Kitchen omeprazole (PRILOSEC) 20 MG capsule Take 20 mg by mouth daily.        . Polyvinyl Alcohol (AKWA TEARS OP) Apply 2 drops to eye 4 (four) times daily.       . potassium chloride SA (K-DUR,KLOR-CON) 20 MEQ tablet Take 20 mEq by mouth 4 (four) times daily.        . rosuvastatin (CRESTOR) 20 MG tablet Take 20 mg by mouth daily.        . sennosides-docusate sodium (SENOKOT-S) 8.6-50 MG tablet Take 1 tablet by mouth at bedtime.        Marland Kitchen tiotropium (SPIRIVA) 18 MCG inhalation capsule Place 18 mcg into inhaler and inhale daily.        Marland Kitchen torsemide (DEMADEX) 20 MG tablet Take 2 tablets two times daily      . LORazepam (ATIVAN) 0.5 MG tablet 1/2 to 1 tablet twice a day as needed       . promethazine (PHENERGAN) 25 MG tablet Take 12.5 mg by mouth every 6 (six) hours as needed.        . traMADol (ULTRAM) 50 MG tablet Take 50 mg by mouth every 6 (six) hours as needed.           Review of Systems Constitutional:   No  weight loss, night sweats,  Fevers, chills, fatigue, lassitude. HEENT:   No headaches,  Difficulty swallowing,  Tooth/dental problems,  Sore throat,                No sneezing, itching, ear ache,  nasal congestion, post nasal drip,   CV:  No chest pain,  Orthopnea, PND,notes  swelling in lower extremities, no anasarca, dizziness, palpitations  GI  No heartburn, indigestion, abdominal pain, nausea, vomiting, diarrhea, change in bowel habits, loss of appetite  Resp: Notes  shortness of breath with exertion and  at rest.  Notes  excess mucus, notes  productive cough,  No non-productive cough,  No coughing up of blood.  Notes  change in color of mucus.  No wheezing.  No chest wall deformity  Skin: no rash or lesions.  GU: no dysuria, change in color of urine, no urgency or frequency.  No flank pain.  MS:  No joint pain or swelling.  No decreased range of motion.  No back pain.  Psych:  No  change in mood or affect. No depression or anxiety.  No memory loss.      Objective:   Physical Exam Gen: obese, WF , in no distress,  normal affect  ENT: No lesions,  mouth clear,  oropharynx clear, no postnasal drip  Neck: No JVD, no TMG, no carotid bruits  Lungs: No use of accessory muscles, no dullness to percussion, coarse rhonchi  Cardiovascular: RRR, heart sounds normal, no murmur or gallops, no peripheral edema  Abdomen: soft and NT, no HSM,  BS normal  Musculoskeletal: No deformities, no cyanosis or clubbing  Neuro: alert, non focal  Skin: cellulitis LE        Assessment & Plan:   STASIS DERMATITIS Flare of dermatitis with early cellulitis Plan  Bactrim DS bid x 10days   CHRONIC OBSTRUCTIVE PULMONARY DISEASE, ACUTE EXACERBATION Acute tracheobronchitis and cellulitis Plan BactrimDS x 10days bid Pulse prednisone    Updated Medication List Outpatient Encounter Prescriptions as of 12/30/2010  Medication Sig Dispense Refill  . albuterol (PROVENTIL) (2.5 MG/3ML) 0.083% nebulizer solution Take 2.5 mg by nebulization 4 (four) times daily.        Marland Kitchen allopurinol (ZYLOPRIM) 100 MG tablet Take 100 mg by mouth 2 (two) times daily.        Marland Kitchen aspirin 81 MG tablet Take 81 mg by mouth daily.        . budesonide (PULMICORT) 0.25 MG/2ML nebulizer solution Take 0.25 mg by nebulization 4 (four) times daily.        Marland Kitchen dextromethorphan (DELSYM) 30 MG/5ML liquid Take 60 mg by mouth every 12 (twelve) hours as needed.        . diltiazem (DILACOR XR) 240 MG 24 hr capsule Take 240 mg by mouth daily.        . ferrous sulfate 325 (65 FE) MG tablet Take 325 mg by mouth 2 (two) times daily.        . insulin aspart (NOVOLOG) 100 UNIT/ML injection Inject 6 Units into the skin 3 (three) times daily before meals.        . insulin aspart (NOVOLOG) 100 UNIT/ML injection SSI before meals and at bedtime       . insulin glargine (LANTUS) 100 UNIT/ML injection Inject 15 Units into the skin at  bedtime.        Marland Kitchen lactulose (CHRONULAC) 10 GM/15ML solution 30 mls by mouth daily- hold for diarrhea       . lidocaine (LIDODERM) 5 % Place 1 patch onto the skin daily. Remove & Discard patch within 12 hours or as directed by MD       . loratadine (CLARITIN) 10 MG tablet Take 10 mg by mouth daily as needed.        Marland Kitchen omeprazole (PRILOSEC) 20 MG capsule Take 20 mg by mouth daily.        . Polyvinyl Alcohol (AKWA TEARS OP) Apply 2 drops to eye 4 (four) times daily.       . potassium chloride SA (K-DUR,KLOR-CON) 20 MEQ tablet Take 20 mEq by mouth 4 (four) times daily.        . rosuvastatin (CRESTOR) 20 MG tablet Take 20 mg by mouth daily.        . sennosides-docusate sodium (SENOKOT-S) 8.6-50 MG tablet Take 1 tablet by mouth at bedtime.        Marland Kitchen tiotropium (SPIRIVA) 18 MCG inhalation capsule Place  18 mcg into inhaler and inhale daily.        Marland Kitchen torsemide (DEMADEX) 20 MG tablet Take 2 tablets two times daily      . predniSONE (DELTASONE) 10 MG tablet Take 4 for three days, 3 for three days, 2 for three days, 1 for three days and stop   30 tablet  0  . sulfamethoxazole-trimethoprim (SEPTRA DS) 800-160 MG per tablet Take 1 tablet by mouth 2 (two) times daily.  20 tablet  0  . DISCONTD: LORazepam (ATIVAN) 0.5 MG tablet 1/2 to 1 tablet twice a day as needed       . DISCONTD: promethazine (PHENERGAN) 25 MG tablet Take 12.5 mg by mouth every 6 (six) hours as needed.        Marland Kitchen DISCONTD: traMADol (ULTRAM) 50 MG tablet Take 50 mg by mouth every 6 (six) hours as needed.

## 2010-12-30 NOTE — Assessment & Plan Note (Addendum)
Flare of dermatitis with early cellulitis Plan  Bactrim DS bid x 10days

## 2011-01-07 ENCOUNTER — Ambulatory Visit (INDEPENDENT_AMBULATORY_CARE_PROVIDER_SITE_OTHER): Payer: Medicare Other | Admitting: Cardiology

## 2011-01-07 ENCOUNTER — Encounter: Payer: Self-pay | Admitting: Cardiology

## 2011-01-07 DIAGNOSIS — E669 Obesity, unspecified: Secondary | ICD-10-CM

## 2011-01-07 DIAGNOSIS — N259 Disorder resulting from impaired renal tubular function, unspecified: Secondary | ICD-10-CM

## 2011-01-07 DIAGNOSIS — I509 Heart failure, unspecified: Secondary | ICD-10-CM

## 2011-01-07 DIAGNOSIS — I10 Essential (primary) hypertension: Secondary | ICD-10-CM

## 2011-01-07 DIAGNOSIS — R609 Edema, unspecified: Secondary | ICD-10-CM

## 2011-01-07 DIAGNOSIS — I5032 Chronic diastolic (congestive) heart failure: Secondary | ICD-10-CM

## 2011-01-07 DIAGNOSIS — I2510 Atherosclerotic heart disease of native coronary artery without angina pectoris: Secondary | ICD-10-CM

## 2011-01-07 NOTE — Assessment & Plan Note (Signed)
Her blood pressure is controlled and he will continue meds as listed.

## 2011-01-07 NOTE — Patient Instructions (Signed)
Please continue your current medications You need weekly weights Please notify us if your weight goes up more than 4 lbs in one week Follow up with Dr Antoine Poche in 6 months

## 2011-01-07 NOTE — Assessment & Plan Note (Signed)
I did review her recent labs as above. No further testing is indicated.

## 2011-01-07 NOTE — Assessment & Plan Note (Signed)
This continues to be an tissue but with lack of exercise I am not hopefully she can lose weight.

## 2011-01-07 NOTE — Progress Notes (Signed)
HPI The Patient presents for followup of diastolic dysfunction and edema. Since I last saw her she was seen by Dr. Delford Field who treated her with a course of Bactrim for possible cellulitis and bronchitis. She did have some increased diuretics for a while. Would follow the creatinine which has been stable and was 1.85 on March 26. The erythema on her legs is improved. Her swelling is much improved. I am not sure if her weight since we are only weighing her once a month or so [facility] community. She did not have any new shortness of breath, PND or orthopnea. She has not had any new chest pressure, neck or arm discomfort. She continues to have some lower extremity swelling with mild erythema. However, this is baseline.  Allergies  Allergen Reactions  . Codeine     REACTION: unspecified  . Penicillins     REACTION: unspecified    Current Outpatient Prescriptions  Medication Sig Dispense Refill  . albuterol (PROVENTIL) (2.5 MG/3ML) 0.083% nebulizer solution Take 2.5 mg by nebulization 4 (four) times daily.        Marland Kitchen allopurinol (ZYLOPRIM) 100 MG tablet Take 100 mg by mouth 2 (two) times daily.        Marland Kitchen aspirin 81 MG tablet Take 81 mg by mouth daily.        . budesonide (PULMICORT) 0.25 MG/2ML nebulizer solution Take 0.25 mg by nebulization 4 (four) times daily.        Marland Kitchen dextromethorphan (DELSYM) 30 MG/5ML liquid Take 60 mg by mouth every 12 (twelve) hours as needed.        . diltiazem (DILACOR XR) 240 MG 24 hr capsule Take 240 mg by mouth daily.        . ferrous sulfate 325 (65 FE) MG tablet Take 325 mg by mouth 2 (two) times daily.        . insulin aspart (NOVOLOG) 100 UNIT/ML injection Inject 6 Units into the skin 3 (three) times daily before meals.        . insulin aspart (NOVOLOG) 100 UNIT/ML injection SSI before meals and at bedtime       . insulin glargine (LANTUS) 100 UNIT/ML injection Inject 15 Units into the skin at bedtime.        Marland Kitchen lactulose (CHRONULAC) 10 GM/15ML solution 30 mls  by mouth daily- hold for diarrhea       . lidocaine (LIDODERM) 5 % Place 1 patch onto the skin daily. Remove & Discard patch within 12 hours or as directed by MD       . loratadine (CLARITIN) 10 MG tablet Take 10 mg by mouth daily as needed.        Marland Kitchen omeprazole (PRILOSEC) 20 MG capsule Take 20 mg by mouth daily.        . Polyvinyl Alcohol (AKWA TEARS OP) Apply 2 drops to eye 4 (four) times daily.       . potassium chloride SA (K-DUR,KLOR-CON) 20 MEQ tablet Take 20 mEq by mouth 4 (four) times daily.        . rosuvastatin (CRESTOR) 20 MG tablet Take 20 mg by mouth daily.        . sennosides-docusate sodium (SENOKOT-S) 8.6-50 MG tablet Take 1 tablet by mouth at bedtime.        . sulfamethoxazole-trimethoprim (SEPTRA DS) 800-160 MG per tablet Take 1 tablet by mouth 2 (two) times daily.  20 tablet  0  . tiotropium (SPIRIVA) 18 MCG inhalation capsule Place 18 mcg into  inhaler and inhale daily.        Marland Kitchen torsemide (DEMADEX) 20 MG tablet Take 2 tablets two times daily      . DISCONTD: predniSONE (DELTASONE) 10 MG tablet Take 4 for three days, 3 for three days, 2 for three days, 1 for three days and stop   30 tablet  0    Past Medical History  Diagnosis Date  . COPD (chronic obstructive pulmonary disease)   . Diastolic congestive heart failure   . C. difficile colitis   . Hypertension   . Hyperlipidemia   . CAD (coronary artery disease)   . Anemia   . Type 2 diabetes mellitus   . Pacemaker 2002  . Cardiac arrest - asystole 2002  . Gallstones   . Pulmonary hypertension   . Renal insufficiency   . Osteoarthritis   . Allergic rhinitis   . Gout     Past Surgical History  Procedure Date  . Ptca 2002  . Appendectomy   . Vesicovaginal fistula closure w/ tah   . Pacemaker placement 2002    ROS: .roshj  PHYSICAL EXAM BP 152/62  Pulse 70  Resp 19  Ht 4\' 11"  (1.499 m)  Wt 178 lb (80.74 kg)  BMI 35.95 kg/m2 PHYSICAL EXAM GEN:  No distress NECK:  No jugular venous distention at 90  degrees, waveform within normal limits, carotid upstroke brisk and symmetric, no bruits, no thyromegaly LYMPHATICS:  No cervical adenopathy LUNGS:  Expiratory wheezing BACK:  No CVA tenderness CHEST:  Unremarkable HEART:  S1 and S2 within normal limits, no S3, no S4, no clicks, no rubs, no murmurs ABD:  Positive bowel sounds normal in frequency in pitch, no bruits, no rebound, no guarding, unable to assess midline mass or bruit with the patient seated. EXT:  2 plus pulses throughout, moderate edema, no cyanosis no clubbing, Mild female with chronic venous stasis changes and mild left leg erythema SKIN:  No rashes no nodules NEURO:  Cranial nerves II through XII grossly intact, motor grossly intact throughout PSYCH:  Cognitively intact, oriented to person place and time  ASSESSMENT AND PLAN

## 2011-01-07 NOTE — Assessment & Plan Note (Signed)
As near as I can tell she is euvolemic today and much improved compared to previous. We discussed again conservative management such as salt restriction and keep it elevated. I will at the nursing home to weigh her at least weekly and let us know if she gains 4 pounds two-week

## 2011-01-07 NOTE — Assessment & Plan Note (Signed)
I have no strong suspicion of active ischemia and she will continue to be managed with risk reduction.

## 2011-01-25 ENCOUNTER — Encounter: Payer: Self-pay | Admitting: *Deleted

## 2011-01-26 NOTE — Assessment & Plan Note (Signed)
[facility]                         ELECTROPHYSIOLOGY OFFICE NOTE   NAME:SIMMONSChidera, Dearcos                       MRN:          [id number]  DATE:06/11/2008                            DOB:          [birth year]    Ms. Branscum is seen in followup for pacemaker implanted for syncope.  This was in 2002.  She has had no recurrent syncope.   Her medications are reviewed and are notable for the soon to be  discontinued prednisone.   On examination today, her blood pressure was 140/60 with a pulse of 78.  Her lungs were clear.  Heart sounds were regular.  Extremities had trace  edema.   Interrogation of Medtronic pulse generator demonstrates a P-wave of 1.4,  impedance of 518, threshold of 0.5 at 0.4, the R-wave was 8 with  impedance of 499, threshold of 0.75 at 0.4.  Battery voltage was 2.74.   IMPRESSION:  1. Sinus node dysfunction.  2. Syncope.  3. Status post pacer for the above.  4. History of chest discomfort with prior coronary artery      intervention.   Ms. Lechuga is doing quite well.  We will see her again in 1 year's  time.  We will change her appointment with Dr. Antoine Poche from October to  March and we will plan to see her on every 6 months basis alternating.     Duke Salvia, MD, [facility]  Electronically Signed    SCK/MedQ  DD: 06/11/2008  DT: 06/12/2008  Job #: [id number]

## 2011-01-26 NOTE — Assessment & Plan Note (Signed)
[facility]                            CARDIOLOGY OFFICE NOTE   NAME:Keyworth, ANNASTACIA DUBA                       MRN:          [id number]  DATE:06/23/2007                            DOB:          [birth year]    PRIMARY CARE PHYSICIAN:  Dr. Romero Belling.   REASON FOR PRESENTATION:  Evaluate patient with diastolic heart failure,  coronary disease and pacemaker placement.   HISTORY OF PRESENT ILLNESS:  The patient presents for followup.  She is  living at [facility].  She really wants to be in an independent  living facility.  She says she is getting along pretty well.  She walks  with a walker a couple of times a day.  She says she is not having any  dyspnea with that level of activity.  She is not having any resting  shortness of breath.  Denies any PND or orthopnea.  She has had some  fleeting shooting pains a couple of times this week, but it is not like  any previous angina.  She is not having any chest pressure, neck or arm  discomfort.  She has not noticed any palpitations, presyncope, or  syncope.  She has had some mild lower extremity swelling, which is  better than previous.  She has been wearing compression stockings.   PAST MEDICAL HISTORY:  1. Heart failure with a well-preserved ejection fraction (EF 55%).  2. COPD with home O2 dependence in the past, ventilatory dependent      respiratory failure with pneumonia pneumococcal sepsis.  3. C. difficile diarrhea.  4. Diabetes mellitus.  5. Hypertension.  6. Dyslipidemia.  7. Anemia.  8. Sacral ulcers.  9. Osteoarthritis.  10.Coronary artery disease (status post PTCA of the LAD in 2002).  11.Asystolic arrest with permanent pacemaker placement in 2002.  12.Remote tobacco use.  13.Chronic renal insufficiency.   ALLERGIES:  1. PENICILLIN.  2. CODEINE.   MEDICATIONS:  1. Albuterol.  2. Simvastatin 40 mg daily.  3. Iron.  4. Colace 100 mg b.i.d.  5. Lantus 15 units nightly.  6.  Spiriva.  7. Aspirin 81 mg daily.  8. Flovent.  9. Protonix 40 mg daily.  10.Diltiazem 240 mg daily.  11.Demadex 20 mg daily.   REVIEW OF SYSTEMS:  As stated in the HPI, and otherwise negative for  other systems.   PHYSICAL EXAMINATION:  The patient is in no distress.  Blood pressure 138/60.  Heart rate 84 and regular.  The patient's exam is seated in the chair.  HEENT:  Eyes unremarkable.  Pupils equal, round and reactive to light.  Fundi not visualized.  Oral mucosa unremarkable.  NECK:  No jugular venous distention at 90 degrees.  Carotid upstroke  brisk and symmetric.  No bruits.  No thyromegaly.  LYMPHATICS:  No adenopathy.  LUNGS:  Clear to auscultation bilaterally.  BACK:  No costovertebral angle tenderness.  CHEST:  Unremarkable.  HEART:  S1 and S2 within normal limits.  No S3.  No S4.  No clicks.  No  rubs.  No murmurs.  ABDOMEN:  Positive bowel sounds.  Normal  in frequency and pitch.  No  rebound or guarding.  I am unable to appreciate organomegaly, pulsatile  mass, or bruits.  SKIN:  No rashes.  No nodules.  EXTREMITIES:  Two plus pulses.  Mild bilateral lower extremity edema.  NEUROLOGIC:  Oriented to person, place, and time.  Cranial nerves 2  through 12 grossly intact.  Motor grossly intact.   EKG:  Sinus rhythm.  Rate 80.  Axis within normal limits.  Intervals  within normal limits.  No acute ST-T wave changes.   ASSESSMENT AND PLAN:  1. Diastolic heart failure.  The patient seems to be euvolemic at this      point.  She will continue with the current regimen.  2. Renal insufficiency.  The patient has significant chronic renal      insufficiency.  I will check a BMET today to make sure this is      stable.  3. Status post pacemaker placement.  The patient did have this      interrogated today.  This demonstrates the atrial amplitude to be      just 2 to 2.8 mV.  The impedance is 473.  The threshold is 0.5 to      0.4.  The right ventricular amplitude is 8 to  11.  The impendence      418.  The threshold 0.75 to 0.4.  Battery voltage is 2.75.  She is      in DDD pacing.  There were no changes.  She will continue with      routine followup.  4. Coronary disease.  She does not have any symptoms consistent with      angina.  The pain she is having is atypical.  No further      cardiovascular testing is suggested.  5. Health maintenance.  The patient was given a flu shot today.  As      above, we will check a BMET.  6. Followup.  The patient very much wants to see Dr. Everardo All.  She      wants to talk about getting      to an assisted living facility.  She wants to talk about a      motorized scooter.  She is doing well, and I will see her in 1      year.     Rollene Rotunda, MD, [facility]  Electronically Signed    JH/MedQ  DD: 06/23/2007  DT: 06/24/2007  Job #: [id number]   cc:   Gregary Signs A. Everardo All, MD

## 2011-01-26 NOTE — Assessment & Plan Note (Signed)
[facility]                            CARDIOLOGY OFFICE NOTE   NAME:[name], [name] HEIMANN                       MRN:          [id number]  DATE:01/04/2008                            DOB:          1927-12-17    The primary is Dr. Romero Belling.   REASON FOR PRESENTATION:  Evaluate patient with chest discomfort.   HISTORY OF PRESENT ILLNESS:  The patient is pleasant 77 year old with  history of coronary disease as described below.  The last time she was  here she was describing some chest discomfort.  I set her up for a  dobutamine stress test.  However, she called back and said that she did  not want to have this done because she had a relative die on the table  during the test.  Since then, she has had some occasional sharp chest  discomfort.  However, it ha been a stable pattern.  She has not had any  severe episodes.  It comes and goes quickly.  She has had some dyspnea  but not anything that is a profound change.  It seems to get  episodically short of breath with activity, but she is very minimally  active.  She is not describing PND or orthopnea.  She has had some mild  swelling.  She had a 10-pound weight gain.   PAST MEDICAL HISTORY:  1. Heart failure with a well-preserved ejection fraction (EF 55%).  2. Coronary artery disease (see the January 2009 note for details).  3. COPD with home O2 dependence in the past.  4. Frequent respiratory infections.  5. Clostridium difficile diarrhea in the past.  6. Diabetes mellitus.  7. Hypertension.  8. Dyslipidemia.  9. Anemia.  10.Cecal ulcers.  11.Osteoarthritis.  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  13.Remote tobacco use.  14.Chronic renal insufficiency.   ALLERGIES:  INTOLERANCE TO PENICILLIN AND CODEINE.   MEDICATIONS:  1. Lantus 5 units daily.  2. Potassium 20 mEq daily.  3. Demadex 40 mg daily.  4. Advair.  5. Prilosec.  6. Diltiazem 240 mg  daily.  7. Aspirin 81 mg daily.  8. Spiriva.  9. Colace.  10.Iron.  11.Simvastatin 40 mg daily.  12.Albuterol.   REVIEW OF SYSTEMS:  As stated in the HPI and otherwise negative for  other systems.   PHYSICAL EXAMINATION:  The patient is in no distress.  Blood pressure  142/79, heart rate 86 and regular.  HEENT:  Eyelids unremarkable; pupils equal, round, and reactive; fundi  not visualized; oral mucosa unremarkable.  NECK:  No jugular venous distention at 45 degrees; carotid upstrokes  brisk and symmetric; no bruits; no thyromegaly.  LYMPHATICS:  No adenopathy.  LUNGS:  Decreased breath sounds but no wheezing or crackles.  BACK:  No costovertebral angle tenderness.  HEART:  PMI not displaced or sustained, S1-S2 within normal limits.  No  S3, no S4, no clicks, rubs, no murmurs.  ABDOMEN:  Morbidly obese, positive bowel sounds, normal in frequency and  pitch, no bruits, no rebound, no guarding,  no midline pulsatile mass, no  organomegaly.  SKIN:  No rashes, no nodules.  EXTREMITIES:  2+ upper pulses, unable to appreciate lower extremity  pulses, mild bilateral lower extremity edema unchanged.  NEUROLOGICAL:  Grossly intact.   EKG:  Sinus rhythm, rate 80, axis within normal limits, intervals within  normal limits, QT prolongation, no acute ST-wave changes, normal magnum  function.   ASSESSMENT/PLAN:  1. Chest discomfort:  The patient's chest comfort is atypical and      similar to January.  She did not want to have a stress test at that      point.  I do not think she needs a cardiac catheterization.      Therefore, she will let me know if she has any increasing symptoms      or change in her symptoms at which point will consider further      testing.  She may consent if she thinks things are getting worse.  2. Weight.  The patient had increased weight, but I do not think this      is fluid.  She is extremely inactive.  She needs to get up more and      perhaps restrict  calories.  She will remain on the medications as      listed.  3. Diastolic heart failure.  I think she is euvolemic, and she remain      on the medications as listed.  4. Bradycardia status post pacemaker placement.  She will have routine      follow-up in our      device clinic.  5. Follow-up.  I will see her again in 6 months.     Rollene Rotunda, MD, [facility]  Electronically Signed    JH/MedQ  DD: 01/04/2008  DT: 01/04/2008  Job #: [id number]   cc:   Gregary Signs A. Everardo All, MD

## 2011-01-26 NOTE — H&P (Signed)
NAMEARCOLA, FRESHOUR                ACCOUNT NO.:  [id number]   MEDICAL RECORD NO.:  [id number]          PATIENT TYPE:  INP   LOCATION:  1823                         FACILITY:  [facility]   PHYSICIAN:  Marcellus Scott, MD     DATE OF BIRTH:  May 11, 1928   DATE OF ADMISSION:  08/08/2007  DATE OF DISCHARGE:                              HISTORY & PHYSICAL   PRIMARY MEDICAL DOCTOR:  Bertram Millard. Hyacinth Meeker, MD, of [name].   CARDIOLOGIST:  Rollene Rotunda, MD, of [facility].   PULMONOLOGIST:  Charlcie Cradle. Delford Field, MD   GASTROENTEROLOGIST:  Iva Boop, MD   CHIEF COMPLAINTS:  1. Dyspnea.  2. Productive cough.   HISTORY OF PRESENTING ILLNESS:  [name] is a pleasant 77 year old  Caucasian female patient with extensive past medical history as  indicated below.  She was in her usual state of health until  approximately 3 days ago, when she started experiencing gradual onset of  dyspnea.  Since then she has had progressive worsening of the dyspnea  with associated chest tightness, wheezing and productive cough of green  and yellow sputum.  The patient denies fevers, chills or rigors.  She  denies any chest pains.  The patient normally does not use oxygen.  However, since the worsening of her dyspnea she has been using oxygen  and frequent nebulizations at the assisted living.  Because of these  worsening symptoms, the patient has been transferred to the emergency  room for further evaluation and management.   PAST MEDICAL HISTORY:  1. CHF with presumed ejection fraction of 55%.  2. COPD, not oxygen-dependent.  3. C. difficile.  4. Type 2 diabetes.  5. Hypertension.  6. Dyslipidemia.  7. Status post ventilatory-dependent respiratory failure in June 2008.  8. Gout.  9. Anemia.  10.Osteoarthritis.  11.Coronary artery disease, status post PTCA of the LAD in 2002.  12.Asystolic arrest with permanent pacemaker insertion in 2002.  13.Chronic kidney disease.  14.Dysphagia.  15.Obesity.  16.Chronic venous stasis of the lower extremities.   PAST SURGICAL HISTORY:  1. Hysterectomy.  2. Appendectomy.  3. Permanent pacemaker placement.   ALLERGIES:  1. PENICILLIN.  2. CODEINE.   CURRENT MEDICATIONS:  1. Aspirin 81 mg p.o. daily.  2. Protonix 40 mg p.o. daily.  3. Cardizem CD 240 mg p.o. daily.  4. Spiriva 18 mcg inhaled daily.  5. Demadex 20 mg p.o. daily.  6. Ferrous sulfate 325 mg twice daily.  7. Colace 100 mg p.o. twice daily.  8. Advair 250/50 mcg one puff twice daily.  9. Zocor 40 mg p.o. q.h.s.  10.Tylenol 650 mg p.o. q.6h. p.r.n.  11.Phenergan 12.5 mg p.o. q.8h. p.r.n.  12.Sarna lotion apply to forearm and chest twice a day p.r.n.  13.Atrovent nebulizers q.4h. p.r.n.  14.Ventolin 0.083% nebulizers q.4h. p.r.n.  15.Triamcinolone cream 0.1% to bilateral lower extremities bilateral      p.r.n. for dermatitis.  16.O2 by nasal cannula at 2 L/min. p.r.n.  17.Mycostatin cream to groin b.i.d. p.r.n.  18.Lantus 10 units subcutaneously daily.   FAMILY HISTORY:  1. The patient's mother deceased at  age 93.  She had diabetes and      hypertension.  2. Father deceased at age unknown.  He had heart disease.  3. The patient had four brothers, three of them deceased.  4. The patient has one child.   SOCIAL HISTORY:  The patient is widowed.  She is a retired Hospital doctor.  She was a heavy smoker, quit in 1987.  There is no history of alcohol or  drug abuse.   ADVANCE DIRECTIVES:  Full code.   REVIEW OF SYSTEMS:  Fourteen systems reviewed and apart from the history  of presenting illness is pertinent for Constipation.  No abdominal pain,  nausea or vomiting.  The patient does have a poor appetite.  There is no  weight loss.  Otherwise the history is noncontributory.   PHYSICAL EXAMINATION:  Ms. Walder is a moderately-built obese female  patient in mild respiratory distress.  VITAL SIGNS:  Temperature is 98.7 degrees Fahrenheit, blood pressure   143/71 mmHg, pulse 104 per minute and regular, respirations 18 per  minute, saturating at 97% on 2 L of oxygen.  HEAD, EYE, ENT:  Nontraumatic, normocephalic.  Bilateral immature  cataracts.  Bilateral pupils equally reactive to light and  accommodation.  Oral cavity with no oropharyngeal erythema or thrush.  NECK:  Without JVD, carotid bruit, lymphadenopathy or goiter. Supple.  LYMPHATIC:  No lymphadenopathy.  RESPIRATORY:  No active accessory muscles.  Decreased breath sounds  bilaterally with coarse rhonchi and few wheezes.  Bilateral basilar  crackles.  CARDIOVASCULAR:  First and second heart sounds heard.  No third or  fourth heart sounds or murmurs.  ABDOMEN:  Nondistended, nontender.  No organomegaly or mass appreciated.  Bowel sounds are normally heard.  CENTRAL NERVOUS SYSTEM:  The patient is awake, alert and oriented x3.  No focal neurologic deficits.  EXTREMITIES:  No cyanosis, clubbing.  Trace bipedal pitting edema.  Peripheral pulses are symmetrically felt.  SKIN:  Without any rashes.  MUSCULOSKELETAL:  Noncontributory.   LAB DATA:  Basic metabolic panel remarkable for glucose 140, BUN 45,  creatinine of 2.11.  CBC with hemoglobin 11, hematocrit 33.4, MCV 78.7,  white blood cells 11.7, platelets 217.  The patient's basic metabolic  panel in June was 1.9.   A chest x-ray, which has been reviewed by me and reported as:  Cardiomegaly with pacemaker.  Chronic bronchitic changes with mild  chronic bibasilar atelectasis.  No definite acute abnormality.   EKG is sinus tachycardia at 103 beats per minute, normal axis, no acute  ischemic changes.  Nonspecific ST-T abnormalities.   ASSESSMENT AND PLAN:  1. Chronic obstructive pulmonary disease exacerbation.  We will admit      patient to telemetry.  We will place on low-flow oxygen.  We will      provide bronchodilators nebulizers, IV steroids, and will treat      with empiric IV Avelox for presumed infective exacerbation.   We      will continue the patient's Advair.  We will also check a BNP to      rule out congestive heart failure.  2. Uncontrolled diabetes.  To check hemoglobin A1c and continue Lantus      and sliding scale insulin.  3. Hypertension, fairly controlled.  Continue Demadex.  4. Chronic kidney disease, which is probably at baseline.  Monitor      basic metabolic panel.  5. Microcytic anemia.  For anemia panel and fecal occult blood      testing.  Will continue iron sulfate.  6. Leukocytosis, probably a stress response.  There is no clinical      focus of sepsis.  In any case, continue the Avelox and follow CBCs.      Marcellus Scott, MD  Electronically Signed     AH/MEDQ  D:  08/08/2007  T:  08/08/2007  Job:  [id number]   cc:   Frederica Kuster, MD  Rollene Rotunda, MD, [facility]  Charlcie Cradle. Delford Field, MD, FCCP  Iva Boop, MD,FACG

## 2011-01-26 NOTE — Discharge Summary (Signed)
NAMEBERNESTINE, [name]                ACCOUNT NO.:  [id number]   MEDICAL RECORD NO.:  [id number]          PATIENT TYPE:  INP   LOCATION:  4707                         FACILITY:  [facility]   PHYSICIAN:  Wilson Singer, M.D.DATE OF BIRTH:  May 26, 1928   DATE OF ADMISSION:  08/08/2007  DATE OF DISCHARGE:  08/09/2007                               DISCHARGE SUMMARY   PRIMARY CARE PHYSICIAN:  Dr. Jacalyn Lefevre.   PHYSICIANS:  Dr. Shan Levans, pulmonologist.   FINAL DISCHARGE DIAGNOSES:  1. Chronic obstructive pulmonary disease exacerbation.  2. Diabetes mellitus.  3. Hypertension.  4. Chronic kidney disease.  5. Anemia of chronic disease.   CONDITION ON DISCHARGE:  Stable.   MEDICATIONS:  1. Avelox 400 mg daily for five days.  2. Prednisone tapering course.  3. Protonix 40 mg daily.  4. Cardizem CD 240 mg daily.  5. Spiriva 18 mcg inhaler inhaled daily.  6. Demadex 20 mg daily.  7. Ferrous sulfate 325 mg b.i.d.  8. Colace 100 mg b.i.d.  9. Advair inhaler 250/50 1 puff b.i.d.  10.Zocor 40 mg q.h.s.  11.Tylenol 650 mg every six hours p.r.n.  12.Phenergan 12.5 mg every eight hours p.r.n.  13.Sarna lotion applied to forearm and chest twice a day p.r.n.  14.Atrovent nebulizers every four hours p.r.n.  15.Ventolin nebulizers every four hours p.r.n.  16.Triamcinolone cream 0.1% to bilateral lower extremities p.r.n.  17.2 L oxygen by nasal cannula as needed.  18.Mycostatin cream to groin b.i.d. p.r.n.  19.Lantus 10 units subcutaneously daily.   HISTORY OF PRESENT ILLNESS:  This very pleasant 77 year old lady was  admitted with shortness of breath, wheezing, and cough productive of  green and yellow sputum.  Please see initial History and Physical  examination done by Dr. Marcellus Scott.   HOSPITAL PROGRESS:  The patient was admitted and put on intravenous  steroids and intravenous antibiotics.  She did very well and improved  significantly rather quickly.  Her chronic kidney  disease function  remained at more or less baseline.  She was transitioned to oral  prednisone today, and on examination she feels great.  There is no  further wheezing.   PHYSICAL EXAMINATION:  VITAL SIGNS:  Temperature 97.9, blood pressure  134/66, pulse 86, saturation 100% on 2 L oxygen.  HEART:  Heart sounds are present and normal.  LUNGS:  Lung fields are clear without any wheezing whatsoever.   INVESTIGATIONS/LABORATORIES:  Today show BUN 54, creatinine 2.16.  White  blood cell count 17 which I suspect is raised due to steroids.   FURTHER DISPOSITION:  I am sending her home on tapering course of  prednisone which is 40 mg daily for three days, 20 mg daily for three  days, 10 mg daily for three days, and then stop.  Also she will have a  further five day course of Avelox 400 mg daily.  This should suffice.      Wilson Singer, M.D.  Electronically Signed     NCG/MEDQ  D:  08/09/2007  T:  08/09/2007  Job:  [id number]   cc:   Bertram Millard.  Hyacinth Meeker, M.D.  Charlcie Cradle Delford Field, MD, FCCP

## 2011-01-26 NOTE — Assessment & Plan Note (Signed)
[facility]                         GASTROENTEROLOGY OFFICE NOTE   NAME:[name], [name]                       MRN:          [id number]  DATE:05/24/2007                            DOB:          [birth year]    CHIEF COMPLAINT:  Dysphagia.   HISTORY:  Ms. Upadhyay is not having any more dysphagia.  Barium swallow  shows mild presbyesophagus.  I explained to her the etiology of that.  She is to avoid cold things if she can, and to use warm water swallows  to relieve any spells if she has them.  If she has recurrent problems,  an empiric dilation may be useful, but I will try to avoid that and use  diet modification.  She will see me as needed.     Iva Boop, MD,FACG  Electronically Signed    CEG/MedQ  DD: 05/24/2007  DT: 05/24/2007  Job #: [id number]   cc:   Frederica Kuster, MD

## 2011-01-26 NOTE — Assessment & Plan Note (Signed)
[facility]                         GASTROENTEROLOGY OFFICE NOTE   NAME:[name], [name]                       MRN:          [id number]  DATE:04/19/2007                            DOB:          02-26-1928    GASTROENTEROLOGY CONSULTATION:   CHIEF COMPLAINT:  Esophageal dysfunction/dysphagia.   REQUESTING PHYSICIAN:  Frederica Kuster, MD   ASSESSMENT:  Solid food dysphagia, mild, in a patient status post  ventilatory-dependent respiratory failure with a severe pneumonia and  pneumococcal sepsis.  The problem started after that, it sounds like.  She seems to be swallowing most things well without any type of food  impaction.  She has had a modified barium swallow.  I do not have those  results, question of they triggered the diagnosis of esophageal  dysfunction.   PLAN:  Schedule barium swallow.  We will evaluate her dysphagia that way  first.  She is at somewhat high risk of problems given her comorbidities  but if there is a clear-cut peptic stricture or problem, we could  schedule an esophageal dilation at the hospital.  I will have her come  back in 3-4 weeks.  In the meantime, continue current medications.   HISTORY:  This lady is a 77 year old white woman with multiple medical  problems including COPD, who had the ventilatory-dependent respiratory  failure as noted above.  This was in June.  She was in the ICU.  She  recovered.  Since that time she has had some difficulty swallowing and  points to her neck and says food does not slide down quite right.  Nothing gets caught that I can tell.  She does not have much heartburn.  She is on Protonix.  There may be no heartburn at all.  She has been  improving but she is in a nursing home and in a wheelchair and on 2 L of  nasal cannula oxygen.  She denies any bowel habit changes, weight loss  that we are aware of, or abdominal pain.   Medications are listed and reviewed in the chart.   ALLERGIES:   PENICILLIN and CODEINE.   PAST MEDICAL HISTORY:  1. Ventilatory-dependent respiratory failure with pneumococcal sepsis      and pneumonia.  2. Urinary tract infection.  3. Diabetes mellitus.  4. Obesity.  5. Chronic obstructive pulmonary disease.  6. Gallstones.  7. Pulmonary hypertension.  8. Presumed asystolic arrest in 2002 with pacemaker placement at that      time.  9. Coronary artery disease with percutaneous transluminal coronary      angioplasty of the LAD December 2002.  10.Syncope secondary to the asystolic arrest previously.  11.Hypertension.  12.Chronic steroid use.  13.Chronic stasis dermatitis in lower extremities.  14.Possible history of atrial fibrillation.  15.Gout.  16.Osteoarthritis.  17.Dyslipidemia.  18.Chronic renal insufficiency.  19.Prior smoker.  20.Sacral decubitus ulcers.  21.Multifactorial anemia.   SOCIAL HISTORY:  A former smoker.  She is living in [employer] home.   REVIEW OF SYSTEMS:  She is wheelchair-bound.  Lower extremity edema is  worsening.  She has chronic dyspnea.  She cannot lie flat.  GI as above.   PHYSICAL EXAMINATION:  Elderly white woman in a wheelchair, somewhat  chronically ill, in no acute distress.  Height 4 feet 11 inches.  Weight estimated to be 168 pounds per patient.  Blood pressure 136/64, pulse 80.  Eyes:  Anicteric.  Neck:  No mass.  Mouth:  She has upper dentures in  place, no lowers.  She says she can eat better with only the upper  dentures in.  No lesions.  The chest shows a scattered wheeze with fair to good air movement.  HEART:  S1-S2.  A 2/6 systolic murmur heard at the right upper sternal  border without radiation.  ABDOMEN:  Protuberant and obese, soft and nontender.  LOWER EXTREMITIES:  4+ tight edema to the mid pretibial area with a  sheen or shine on the skin associated with this but no warmth or redness  or signs of cellulitis.  She is alert and oriented x3.   I appreciate the  opportunity to care for this patient.  I have reviewed  discharge summaries, previous pulmonary clinic notes, and Dr. Rondel Baton  request for consultation.     Iva Boop, MD,FACG  Electronically Signed    CEG/MedQ  DD: 04/19/2007  DT: 04/19/2007  Job #: [id number]   cc:   Frederica Kuster, MD  [facility]

## 2011-01-26 NOTE — Discharge Summary (Signed)
[name], [name]                ACCOUNT NO.:  [id number]   MEDICAL RECORD NO.:  [id number]          PATIENT TYPE:  INP   LOCATION:  6737                         FACILITY:  [facility]   PHYSICIAN:  Valerie A. Felicity Coyer, MDDATE OF BIRTH:  07-12-1928   DATE OF ADMISSION:  02/17/2007  DATE OF DISCHARGE:  03/08/2007                               DISCHARGE SUMMARY   DISCHARGE DIAGNOSES:  1. Status post ventilator-dependent respiratory failure secondary to      severe pneumonia in the setting of Pneumococcal sepsis, status post      extubation February 25, 2007.  2. Clostridium difficile negative diarrhea, resolved.  3. Diabetes, type 2.  4. Hypertension.  5. Dyslipidemia.  6. Multifactorial anemia.  7. Debilitation.  8. Stage II sacral ulcers.   HISTORY OF PRESENT ILLNESS:  [name] is a 77 year old white female  who was admitted on February 17, 2007,with Chief Complaint of fever. She has  a history of oxygen-dependent COPD and is maintained on 2 liters per  minute of nasal cannula oxygen.  She also had a history of chronic heart  failure.  She presented with complaints of increasing shortness of  breath for several weeks.  However, it has become  worse since Monday,  February 13, 2007, with a new cough.  She did note some chills as well as  some nausea and vomiting.  She was admitted for further evaluation and  treatment.   PAST MEDICAL HISTORY:  1. COPD, home O2 dependent with moderate pulmonary hypertension.  2. Chronic diastolic heart failure.  3. Diabetes, type 2.  4. Sick sinus syndrome status post permanent pacemaker in 2002.  5. Hypertension.  6. History of asystolic arrest in the past prior to permanent      pacemaker placement.  7. Dyslipidemia.  8. Coronary artery disease status post PTCA of the LAD December 2002.  9. Chronic bilateral lower extremity stasis dermatitis.  10.History of gout with osteoarthritis.   HOSPITAL COURSE:  #1.  STATUS POST VENTILATOR-DEPENDENT RESPIRATORY  FAILURE SECONDARY TO  SEVERE PNEUMONIA IN THE SETTING OF PNEUMOCOCCAL SEPSIS:  The patient was  admitted and was seen by critic care team.  She was treated with IV  Primaxin as well as IV steroids.  Cultures drawn on February 17, 2007, grew  Streptococcus pneumoniae which was sensitive to ceftriaxone,  levofloxacin, and penicillin.  Urine culture performed at that time was  positive for E. coli greater than 100,000.  E. coli was pansensitive.  Antibiotics were changed to IV Rocephin based on culture data. Post  extubation, the patient has remained stable.  She did have episodes of  diarrhea and was started on empiric Flagyl.  However, stool was negative  for C. difficile.  She was continued on oxygen an was transferred to the  floor.  She is currently on prednisone 10 mg p.o. daily.  Her lungs are  clear.  She did not require maintenance steroids prior to this  admission.  She will be continued on DuoNeb at time of discharge;  however, we will discontinue prednisone.   #2.  DIABETES, TYPE 2:  The patient was maintained on Lantus as well as  sliding scale coverage.  Her blood sugar at time of discharge was  ranging from 130 to 249.  We will increase Lantus from 10 to 15 units,  which was her home dose prior to admission.   #3.  DEBILITATION:  At this time, physical therapy and occupational  therapy recommends skilled nursing facility placement.  She has a bed  this afternoon at a skilled facility and is medically stable for  transfer.   #4.  STAGE II SACRAL ULCERS:  The patient was noted to have some mild  skin breakdown to sacrum, stage II, as well as erythema consistent with  sacral candidiasis. Plan to continue Re-Stor to buttock as will as  nystatin with frequent turning.   PHYSICAL EXAMINATION:  VITAL SIGNS: Blood pressure 142/87, heart rate  90, respiratory rate 22, temperature 97.9, O2 saturation 98/% on 2  liters.  GENERAL:  The patient is awake, alert, elderly, obese white female  in no  acute distress.  CARDIOVASCULAR:  S1 and S2.  Regular rate and rhythm.  LUNGS:  Clear to auscultation bilaterally without wheeze, rales, or  rhonchi.  ABDOMEN:  Soft, nontender, nondistended.  Positive bowel sounds.  EXTREMITIES:  2+ bilateral lower extremity edema.  NEUROLOGIC:  Awake and alert, moving all extremities.  Speech is clear.   DISCHARGE MEDICATIONS:  1. Lovenox 30 mg subcutaneously daily for DVT prophylaxis.  2. Aspirin 81 mg p.o. daily.  3. Demadex 20 mg p.o. b.i.d.  4. NovoLog sliding coverage before meals or food and nightly.  5. Protonix 40 mg p.o. daily.  6. Cardizem CD 240 mg p.o. daily.  7. Ferrous sulfate 325 mg p.o. b.i.d.  8. DuoNeb q. 6 h.  9. Lantus 15 units subcutaneously nightly.  10.Zocor 40 mg p.o. daily in the evening.  11.Nystatin powder 3 times a day to buttocks.  12.Tylenol 650 mg p.o. q. 6 h. P.r.n.  13.Xanax 0.25 mg p.o. 3 times a day p.r.n.  14.Ativan 0.5 mg p.o. or IV q. 6 h p.r.n.   PERTINENT LABORATORY DATA AT TIME OF DISCHARGE:  BUN 44, creatinine 1.9.  Hemoglobin 9.5, hematocrit 29.6.   DISPOSITION:  Plan to transfer patient to skilled nursing facility.   FOLLOWUP:  Upon discharge, she will need to follow up with her primary  care physician, Sean A. Everardo All, MD; her pulmonologist, Charlcie Cradle.  Delford Field, MD, FCCP; and her cardiologist, Rollene Rotunda, MD, [facility].      Sandford Craze, NP      Raenette Rover. Felicity Coyer, MD  Electronically Signed    MO/MEDQ  D:  03/08/2007  T:  03/08/2007  Job:  [id number]   cc:   Gregary Signs A. Everardo All, MD

## 2011-01-26 NOTE — Assessment & Plan Note (Signed)
[facility]                         ELECTROPHYSIOLOGY OFFICE NOTE   NAME:SIMMONSLaurina, Fischl                       MRN:          [id number]  DATE:02/07/2007                            DOB:          Nov 16, 1927    Ms. Breau is seen.  She has chronic diastolic congestive heart failure  with significant fluid overload so that her legs are wrapped in  bandages.  She has seen both Dr. Everardo All and Dr. Delford Field recently, as  well as being seen in the congestive heart failure clinic within the  month.   Her medication list is legion.  It is notable for Cardizem 180,  prednisone, Demadex 60 b.i.d., potassium 20 b.i.d., insulin.   On examination, her blood pressure today was 146/77, her pulse was 80.  Lungs were clear, heart sounds were regular.  Her lower extremities, as  I noted, were wrapped in bandages because of edema.   Interrogation of her Medtronic Kappa 901 pulse generator demonstrates a  P wave of 1 with impedance of 473, a threshold of 0.5 at 0.4.  The R  wave was 5.6 with impedance of 373, a threshold of 0.75 at 0.4.  Battery  longevity was adequate.  She is atrially sensed about 98% of the time.  She does have not infrequent atrial high-rate episodes.   IMPRESSION:  1. Shortness of breath.  2. Chronic diastolic congestive heart failure.  3. Sinus node dysfunction status post pacemaker implantation.  4. Chronic obstructive pulmonary disease.   Interestingly, her symptoms have continued to worsen, although her  atrial arrhythmia trend suggests that her heart rhythm has been much  better over the last 2 weeks as opposed to the first 10 days of the  month of May.  This atrial arrhythmia, though, is an important  observation because her ventricular rate during her atrial arrhythmia is  very fast with a mean rate of probably greater than 110 beats per  minute.  This having been said, we will have to keep a close eye on this  as there are few options, I  think, in this lady for control of her  rhythm.  Augmented rate control with Cardizem might be of value.  Lanoxin may be of value.  Beta blockers would certainly be of value but  are unlikely to be well tolerated given her lung disease.  Consideration  might have to be given to AV junction ablation.   I have arranged for her to see Dr. Antoine Poche in about 4 weeks' time.  We  will work on trying to continue to diurese her gently and I have started  her on digoxin today at a dose of 0.125 mg.   We will also, after we check her BMET, consider the addition of  Zaroxolyn at a very low dose intermittently to see if we cannot augment  her diuresis.   She will follow up with Dr. Antoine Poche as noted.     Duke Salvia, MD, [facility]  Electronically Signed    SCK/MedQ  DD: 02/07/2007  DT: 02/07/2007  Job #: [id number]

## 2011-01-26 NOTE — Assessment & Plan Note (Signed)
[facility]                            CARDIOLOGY OFFICE NOTE   NAME:[name], [name]                       MRN:          [id number]  DATE:10/10/2007                            DOB:          March 11, 1928    PRIMARY:  Dr. Romero Belling.   REASON PRESENTATION:  Evaluate the patient with chest discomfort.   HISTORY OF PRESENT ILLNESS:  The patient presents for follow-up of chest  discomfort and known coronary disease.  We have been following her for  diastolic heart failure.  However, over the past 2-3 weeks she reports  chest discomfort.  This is new to her and not one for more chronic  discomforts.  She describes it as sharp.  Seems to happen sporadically.  She is noticing it more at night.  She does not associate it with foods.  Does not radiate.  It lasts for several minutes at a time.  There is no  associated nausea, vomiting, excessive diaphoresis.  There is no  palpitations, presyncope or syncope associated with this.  She had no  PND or orthopnea.  She cannot bring this on.   She has had a cough.  She reports some chills.  She says she has  occasional productive sputum with yellow tinge to it.   PAST MEDICAL HISTORY:  Heart failure.  Well-preserved ejection fraction  (EF 55%), coronary artery disease (Catheterization April 2006  demonstrated LAD 40% stenosis.  This proximal stent which was patent  with 40-50% stenosis.  First diagonal had ostial 60% stenosis, second  diagonal was normal.  The circumflex had luminal irregularities.  There  was a ramus intermediate with ostial 60% stenosis, mid obtuse marginal  had luminal irregularities, the right coronary artery is dominant with  luminal irregularities.  The patient had PTCA of the LAD in 2002), COPD  with home O2 dependence in the past.  There are frequent respiratory  infections, C.  Difficile diarrhea past, diabetes mellitus,  hypertension, dyslipidemia, anemia, cecal ulcers, osteoarthritis,  asystolic arrest in the past with permanent pacemaker placement 2002,  remote tobacco use, chronic renal insufficiency.   ALLERGIES/DRUG INTOLERANCES:  PENICILLIN and CODEINE.   MEDICATIONS:  Potassium 12 mEq daily, Demadex 40 mg daily, Advair,  Prilosec, Diltiazem 240 mg daily, aspirin 81 mg daily, Spiriva, Lantus,  Colace 100 mg b.i.d., iron, simvastatin 40 mg daily, albuterol p.r.n.   REVIEW OF SYSTEMS:  As stated in HPI, otherwise negative for other  systems.   PHYSICAL EXAMINATION:  The patient is not in any acute distress.  She is  examined while seated in the chair because of her difficulty with  mobility.  HEENT:  Eyelids unremarkable, pupils equal, round, reactive, fundi not  visualized, oral mucosa unremarkable.  NECK:  No jugular distention, waveform within normal limits at 90  degrees, carotid upstroke brisk and symmetric, no bruits, thyromegaly.  Lymphatics no cervical, axillary, inguinal adenopathy.  LUNGS:  Clear to auscultation bilaterally.  BACK:  No costovertebral angle tenderness.  CHEST:  Unremarkable.  HEART:  S1-S2 within normal.  No S3-S4, no clicks, rubs, murmurs.  ABDOMEN:  Positive  bowel sounds normal frequency and pitch, no bruits,  rebound, guarding or midline pulsatile mass, organomegaly.  SKIN:  No rashes, no nodules.  EXTREMITIES:  2+ pulses upper, unable to appreciate lower, mild to  moderate bilateral lower extremity edema.  NEURO:  Grossly intact.   EKG sinus rhythm, premature atrial contractions, premature ventricular  contractions no acute ST-T wave changes.   ASSESSMENT/PLAN:  1. Chest discomfort.  The patient's chest discomfort is atypical.      However, it is been several years since her last stress perfusion      study and her catheterization.  Therefore, will ahead and proceed      with a dobutamine Cardiolite.  I want to avoid adenosine with her      lung history and wheezing that she has had frequently.  2. Cough, dyspnea.  The  patient has some more dyspnea and cough.  She      is prone to upper respiratory infections.  Due to late hour of this      visit, I could not order a chest x-ray.  I do not see any absolute      findings of a pneumonia or bronchitis.  She is not in any distress.      I have sent e-mail to Dr. Everardo All asking him to see the patient in      next few days to evaluate possible upper respiratory infection      probably to include a chest x-ray.  3. Diastolic heart failure.  I think she is euvolemic at this point.      She will continue medications as listed.  4. Follow-up will be back in this clinic in about 6 months or sooner      if she does well and has no findings under stress test.     Rollene Rotunda, MD, [facility]  Electronically Signed    JH/MedQ  DD: 10/10/2007  DT: 10/11/2007  Job #: [id number]   cc:   Bertram Millard. Hyacinth Meeker, M.D.  Sean A. Everardo All, MD

## 2011-01-26 NOTE — H&P (Signed)
[name], [name]                ACCOUNT NO.:  [id number]   MEDICAL RECORD NO.:  [id number]          PATIENT TYPE:  INP   LOCATION:  3304                         FACILITY:  [facility]   PHYSICIAN:  Lucita Ferrara, MD         DATE OF BIRTH:  1927/11/25   DATE OF ADMISSION:  01/31/2008  DATE OF DISCHARGE:                              HISTORY & PHYSICAL   PRIMARY CARE PHYSICIAN:  Bertram Millard. Hyacinth Meeker, M.D. [employer].   CARDIOLOGIST:  Rollene Rotunda, MD, [facility],  [facility].   PULMONOLOGIST:  Charlcie Cradle. Delford Field, MD, FCCP.   GASTROENTEROLOGIST:  Iva Boop, MD,FACG.   HISTORY OF PRESENT ILLNESS:  The patient is a 77 year old female who  presents to [facility] with a chief complaint of dyspnea,  shortness of breath that is getting progressively worse.  She is a known  COPD.  She actually uses CPAP at home.  She describes her dyspnea as  in  conjunction with some orthopnea, paroxysmal nocturnal dyspnea,  although  she feels that her chest has been real tight.  She is  coughing and  wheezing at the same time. She feels like she is getting another COPD  exacerbation.  She denies any chills or fever or dysuria.  She was  brought in by EMS.  She was given albuterol, BiPAP, nitroglycerin,  oxygen, and steroids in the emergency room with significant improvement.  She lives in assisted living facility in her ambulation a obviously  decreased secondary to progressive dyspnea.   ALLERGIES:  Allergic to CODEINE and PENICILLIN.   Her cough is nonproductive.   PAST MEDICAL HISTORY:  1. CHF with ejection fraction 55%.  2. COPD.  Currently she is apparently on home oxygen 2 liters as      needed.  3. History of C diff colitis.  4. Type 2 diabetes.  5. Hypertension.  6. Dyslipidemia.  7. She has a history of  vent-dependent respiratory failure in 2008.  8. Gout.  9. Anemia.  10.Osteoarthritis.  11.Coronary artery disease, status post PTCA of LAD 2002.  12.Asystolic  arrest with permanent pacemaker insertion 2002.  13.Chronic kidney disease.  14.Dysphagia.  15.Obesity.  16.Lower extremity chronic venous stasis.   PAST SURGICAL HISTORY:  1. Status post hysterectomy.  2. Appendectomy.  3. Permanent pacemaker placement.   CURRENT MEDICATIONS:  1. Advair 250/50 1 puff b.i.d.  2. Aspirin 81 mg daily.  3. Cardizem CD 240 mg p.o. daily.  4. Colace 100 mg p.o. b.i.d.  5. Ferrous sulfate 325 mg p.o. b.i.d.  6. MiraLax.  7. Phenergan.  8. Potassium chloride.  9. Prilosec.  10.Simvastatin.  11.Tussionex.  12.Tylenol.   Note:  Doses need to be verified and are not finalized until so.   REVIEW OF SYSTEMS:  Otherwise negative and per HPI.   FAMILY HISTORY:  Noncontributory.   SOCIAL HISTORY:  She is widowed.  Denies drugs, alcohol or tobacco.  Today is her birthday.  Advanced directive is full code.   PHYSICAL EXAMINATION:  VITAL SIGNS:  The patient's blood pressure is  150/64,  pulse 80, respirations 16, temperature is 98.7, and pulse oxygen  100% on 2 L nasal cannula.  HEENT:  Normocephalic, atraumatic.  Sclerae anicteric.  NECK:  Supple.  No JVD or carotid bruits.  LUNGS:  Generally speaking the patient is in acute respiratory distress  secondary to dyspnea.  CARDIOVASCULAR:  S1 and S2.  Regular rhythm.  No murmurs, rubs or  clicks.  ABDOMEN:  Soft, nontender, nondistended.  Positive bowel sounds.  LUNGS:  There is diffuse expiratory wheezes and some rhonchi and no  rales.  EXTREMITIES:  There is bilateral pitting edema 2+ lower extremities.  NEURO:  Patient is alert and oriented x3.  Cranial nerves II-XII grossly  intact.   LABORATORY DATA:  Significant for B natriuretic peptide to be 39.  Complete metabolic panel shows moderately low potassium 3.4, glucose  high at 193, BUN is 29, creatinine 1.68, albumin low at 3.2.  Urinalysis  shows negative leukocytes or nitrites.  CBC shows mildly high white  count 11.1, hemoglobin 11.1,  hematocrit 33.1, platelets 222,000.  Cardiac enzymes x3 negative.  Chest x-ray shows a stable exam.   ASSESSMENT/PLAN:  A 77 year old female with chronic obstructive  pulmonary disease on home oxygen, presents with signs and symptoms  consistent with chronic obstructive pulmonary disease exacerbation, and  less likely congestive heart failure exacerbation.  1. Chronic obstructive pulmonary disease exacerbation, less likely      congestive heart failure.  2. Diabetes type 2 that seems to be uncontrolled secondary to her      increased CBG.  3. Chronic kidney disease with a baseline of creatinine of 1.6, but      stable.  4. Microcytic anemia with stable hemoglobin on iron sulfate.  Follows      up with GI outpatient.  5. History of Clostridium difficile colitis.  6. Congestive heart failure with ejection fraction of 55%, diastolic      dysfunction followed up with [facility]   PLAN:  Will admit the patient to the step-down unit for overnight BiPAP  at bedtime.  Monitor ABG.  Solu-Medrol 50 mg IV q.6 h.  Continue  Spiriva, albuterol and Advair per home regimen.  Empiric Levaquin  treatment.  Cycle cardiac enzymes x3 every 8 hours.  She recently had an  echocardiogram on February 21, 2007.  I will go ahead and get a 2-D  echocardiogram on this admission.  Appropriate consultations will be  made depending on the patient's progress.  Most likely if we need help,  we will go ahead and get pulmonary medicine and cardiology.  Currently  the patient is hemodynamically stable and the rest of her plans are  dependent on her progress.   CODE STATUS:  Full code.      Lucita Ferrara, MD  Electronically Signed     RR/MEDQ  D:  02/01/2008  T:  02/01/2008  Job:  [id number]

## 2011-01-26 NOTE — Assessment & Plan Note (Signed)
[facility]                            CARDIOLOGY OFFICE NOTE   NAME:[name], [name]                       MRN:          [id number]  DATE:04/28/2007                            DOB:          1927-12-02    PRIMARY:  Gregary Signs A. Everardo All, M.D.   REASON FOR PRESENTATION:  Evaluate the patient with diastolic heart  failure.   HISTORY OF PRESENT ILLNESS:  The patient presents for followup after a  hospitalization in June with pneumonia.  She was intubated at that point  in time.  She is now in rehab.  She does some chair exercises.  She does  the video sports games.  She says she is fatigued.  However, she is not  having any overt shortness of breath.  She denies any PND or orthopnea.  She has not been having any palpitations, pre-syncope, or syncope.  She  has had slowly progressive lower extremity edema.  She is not getting  weighed daily.  She is avoiding salts and taking medications as listed.  Her volume management is made difficult by her renal insufficiency.   PAST MEDICAL HISTORY:  Heart failure with well-preserved ejection  fraction (Echocardiogram June 2008 demonstrated an EF of 55% with some  focal basal septal hypertrophy.  There were no significant valvular  abnormalities).  COPD with home O2 dependence.  Ventilatory respiratory  dependent failure with pneumonia and pneumococcal sepsis.  Clostridium  difficile diarrhea.  Diabetes mellitus.  Hypertension.  Dyslipidemia.  Anemia.  Sacral ulcers.  Osteoarthritis.  Coronary artery disease  (status post PTCA of an LAD in 2002).  Asystolic arrest with permanent  pacemaker placement in 2002.  Remote tobacco use.  Chronic renal  insufficiency.   ALLERGIES/DRUG INTOLERANCES:  PENICILLIN.  CODEINE.   MEDICATIONS:  1. DuoNeb q.6h.  2. Demodex 5 mg daily.  3. Diltiazem 240 mg daily.  4. Protonix 40 mg daily.  5. Flovent 2 puffs b.i.d.  6. Aspirin 81 mg daily.  7. Spiriva.  8. NovoLog.  9. Lantus 15  units nightly.  10.Colace 100 mg b.i.d.  11.Iron.  12.Simvastatin 40 mg daily.  13.Albuterol.   REVIEW OF SYSTEMS:  As stated in the HPI and otherwise negative for  other systems.   PHYSICAL EXAM:  The patient is examined seated in the chair.  Blood pressure 157/80, heart rate 79 and irregular, weight 161 pounds.  Body mass index is 32.  HEENT:  Eyelids unremarkable.  Pupils equal, round, and reactive to  light.  Fundi not visualized.  Oral mucosa unremarkable.  NECK:  No jugular venous distension at 90 degrees.  Carotid upstroke  brisk and symmetric.  No bruits, thyromegaly.  LYMPHATICS:  No lymphadenopathy.  LUNGS:  Clear to auscultation without wheezing or crackles.  BACK:  No costovertebral angle tenderness.  CHEST:  PMI is not appreciated.  Quiet precordium.  S1, S2 within normal  limits.  No S3, S4.  No clicks, rubs, murmurs.  ABDOMEN:  Morbidly obese, positive bowel sounds, normal in frequency and  pitch, no bruits, rebound, guarding, midline pulsatile masses,  hepatomegaly, splenomegaly.  SKIN:  No rashes, no nodules.  EXTREMITIES:  2+ upper pulses.  Unable to appreciate dorsalis pedis and  posterior tibialis bilaterally.  Moderate bilateral lower extremity  edema.  There is a small ulcer in the left mid pretibial region.  There  is a slight bilateral erythema.  NEURO:  Oriented to person, time, and place.  Cranial nerves 2-12 are  intact.  Motor grossly intact.   An EKG, rate 78, axis within normal limits, intervals within normal  limits, poor anterior R wave progression.  QT prolonged.   ASSESSMENT AND PLAN:  1. Diastolic heart failure.  The patient is not having any new      shortness of breath.  She seems to be euvolemic from this      standpoint.  She has had significant renal insufficiency, and so we      have to be very judicious on the use of diuretics, as described      below.  2. Lower extremity edema.  This is the most significant cardiovascular       problem at this point.  She does sit with her feet down at least 5      hours a day and I suspect more.  At this point, I have written      orders for her to be keeping her feet above her heart as much as      possible.  I discussed this with her transportation person today.      Keeping her feet elevated and eventually treating the ulcer, and      allowing [place] to use compression stockings would be helpful.  the      patient is going to continue with salt and fluid restriction.  I      will defer labs to Dr. Everardo All and the doctors at her rehab      facility.  3. Followup.  I will see the patient back again in 2 months or sooner      if she has increased problems.     Rollene Rotunda, MD, [facility]  Electronically Signed    JH/MedQ  DD: 04/28/2007  DT: 04/29/2007  Job #: [id number]   cc:   Gregary Signs A. Everardo All, MD

## 2011-01-26 NOTE — Assessment & Plan Note (Signed)
[facility]                             PULMONARY OFFICE NOTE   NAME:SIMMONSJeimy, Bickert                       MRN:          [id number]  DATE:04/05/2007                            DOB:          Aug 16, 1928    Ms. [name] returns in followup and is a 77 year old white female  admitted between June 6th and June 25th for pneumococcal pneumonia with  ventilatory dependent respiratory failure, Clostridium difficile  negative diarrhea - resolved, diabetes, and hypertension, multifactorial  anemia, debilitation, and type 2 sacral ulcers.  She was discharged to  [place] for rehab on the June 25th.   Since discharge, she has no hemoptysis, chest pain, or abdominal pain,  no headache, nausea, vomiting, fever, chills, or sweats.  She has an  occasional cough of white-yellow mucus.  She also complains of foul  smell in her urine and dysuria.   CURRENT MEDICATIONS:  1. Cardizem 240 mg daily.  2. Protonix 40 mg daily.  3. She is supposed to be on Spiriva daily but is out of this.  4. Lantus 15 units p.m.  5. Lidex cream.  6. Demadex 20 mg b.i.d.  7. Simvastatin 40 mg daily.  8. Iron 325 mg daily.  9. DuoNeb q.i.d.  10.Oxygen is supposed to be 24/7, but she is not on this during the      day.   PHYSICAL EXAMINATION:  VITAL SIGNS:  Temp 98, blood pressure 120/80,  pulse 84, saturation 96% on room air.  CHEST:  Showed distant breath sounds without evidence of wheeze or  rhonchi.  CARDIAC:  Showed a regular rate and rhythm without S3.  Normal S1 S2.  ABDOMEN:  Soft, nontender.  EXTREMITIES:  Showed no edema or clubbing.  SKIN:  Clear.   Chest x-ray obtained today showed clear evidence of infiltrate on the  right, COPD changes, cardiomegaly.  Urinalysis showed moderate leukocyte  esterase and large amount of blood and 3+ bacteria.  Culture is pending.   IMPRESSION:  1. Urinary tract infection with cleared pneumonia.  2. Diabetes.  3. Chronic  obstructive pulmonary disease.   PLAN:  1. Maintain neb treatments as is.  2. Recommend Cipro 750 mg twice a day for a 7-day course.  3. The patient will maintain oxygen 2 liters continuous.  4. Resume Spiriva daily.  5. She will also receive humidification to her oxygen.   We will see the patient back in followup in one month.     Charlcie Cradle Delford Field, MD, [facility]  Electronically Signed    PEW/MedQ  DD: 04/06/2007  DT: 04/06/2007  Job #: [id number]   cc:   [facility] A. Everardo All, MD

## 2011-01-26 NOTE — H&P (Signed)
[name], [name]                ACCOUNT NO.:  [id number]   MEDICAL RECORD NO.:  [id number]          PATIENT TYPE:  INP   LOCATION:  1827                         FACILITY:  [facility]   PHYSICIAN:  Noralyn Pick. Eden Emms, MD, FACCDATE OF BIRTH:  [birth year]   DATE OF ADMISSION:  02/17/2007  DATE OF DISCHARGE:                              HISTORY & PHYSICAL   The patient is a 77 year old patient we are asked to see for chest pain.   Unfortunately, the patient is extremely sick.  She is morbidly obese.  She has severe chronic obstructive pulmonary disease requiring home 02.  She has been sick for about three weeks.  She has had congestion with  purulent sputum and pneumonia.  She was seen in the pulmonary clinic by  Dr. Delford Field on 01/10/2007.  He had extended her course of Avelox.  The  patient continued to do poorly and came to the emergency room with  increasing shortness of breath.  Her chest pain is non anginal.  It is  related to her difficulty breathing and her pneumonia.   The patient has a history of diastolic congestive heart failure.  She  was last seen by Dr. Graciela Husbands in EP on 02/07/2007 for some atrial  tachycardia and last seen by Dr. Antoine Poche on 01/09/2007 for follow up of  her diastolic heart failure.   The patient's pain is somewhat pleuritic in nature.  It does hurt when  she takes a deep breath or when she rolls on her side.  There was  associated diaphoresis which is likely from her fever of 104 and her  pneumonia.   REVIEW OF SYSTEMS:  Otherwise remarkable for recent cellulitis and  wrapping with bandaging of her lower extremities over the last four  weeks.  There has been no history of DVT.  The review of systems is  otherwise remarkable for declining general health, increasing shortness  of breath.  Otherwise negative.   PAST MEDICAL HISTORY:  Includes diastolic heart failure.  She had a cath  in 2006 with no significant coronary artery disease.  Her last echo this  year  showed an EF of 65%.  At cath she had mild pulmonary hypertension  with pulmonary pressures of 30 to 40 systolic.  The patient is morbidly  obese.  She has had a previous asystolic arrest likely related to her  pulmonary status with placement of a pacemaker in 2002 by Dr. Graciela Husbands.  She has a history of hypertension, COPD requiring 02 with multiple  hospitalizations for pneumonia and bronchitis, type 2 diabetes, steroid  use, lower extremity edema with recent cellulitis, osteoarthritis,  hyperlipidemia.   ALLERGIES:  THE PATIENT IS ALLERGIC TO CODEINE AND PENICILLIN   SOCIAL HISTORY:  The patient has been living in assisted living.  She  has had declining health.  She currently does not smoke or drink.  I  talked to her about breathing tubes and such.  She has not been made a  DNR in the past and this discussion has not been brought up, so I  suspect she is a Full Code.  MEDICATIONS:  Her medications prior to admission included:  1. Cardizem 180 a day.  2. Fluconazole ointment to her legs with wraps daily.  3. Protonix 40 mg a day.  4. Spiriva daily.  5. Aspirin a day.  6. Allopurinol 300 mg a day.  7. Lantus 15 units SQ at night.  8. NovoLog insulin sliding scale.  9. Lidex cream for itching to her legs.  10.Prednisone.  She has been on higher doses per Dr. Delford Field in the      last month but I believe she is tapering them to 10 mg a day.  11.She has been on recent Avelox at 400 mg for prolonged period.  12.Tramadol 50 b.i.d.  13.Klor-Con 20 b.i.d.  14.Demadex, I believe the current dose is 60 b.i.d.  15.Flovent DuoNeb's.  16.Simvastatin 40 mg a day.   FAMILY HISTORY:  Non contributory.   PHYSICAL EXAMINATION:  GENERAL:  Her exam is remarkable for a  chronically ill appearing, morbidly obese white female, in some  respiratory distress.  VITAL SIGNS:  She has a fever of 104.  Heart rate  is 90 and regular.  Blood pressure is 120/70, respiratory rate 24.  She  has audible  wheezing and gurgling.  Her blood pressure is 160/60.  Her  sats were 93% on four liters.  HEENT:  Unremarkable.  JVP is not visible.  There are no carotid bruits.  No thyromegaly, no lymphadenopathy.  LUNGS:  She has coarse breath sounds and rhonchi throughout.  There is  decreased air space sounds in the left lower lobe as well as the right  base.  ABDOMEN:  Protuberant.  She does have significant right lower quadrant  tenderness.  There is no rebound but there appears to be a fullness in  the right lower quadrant.  There is no AAA, no hepatosplenomegaly, no  hepatojugular reflux.  EXTREMITIES:  Femorals are deep and +2 with no bruit. Both of her legs  were wrapped in bandages.  Her dorsalis pedis are palpable bilaterally.  There is +1 to 2 edema bilaterally.  There is no muscular weakness and  no neurological defects.   LABORATORY DATA:  Her chest x-ray shows left mid and lower lobe  pneumonia with small right pleural effusion.   Her lab results are remarkable for a dig level of 1.0.  CBC is  remarkable for white count of 27.3, hematocrit 29.7.  Her BNP is 348.  Her liver enzymes are normal.  Her BMET is remarkable for potassium of  4.3, BUN 30, creatinine 2.2.   Electrocardiogram was not on the chart for review.  I will have to find  this at the EKG station.   IMPRESSION:  The patient's pain is non cardiac.  It is related to her  fever, work of breathing and pneumonia.   She has apparently a distant history of an LAD angioplasty but no  coronary disease on cath in 2006.  I will track down her ECG.  Currently  she is very tenuous.  She will be admitted to the Intensive Care Unit.  She will be started on broad spectrum antibiotics such as Primaxin 500  mg q 8.  A consent form was signed for Brett Canales Minor to place a central  venous catheter.   The patient should probably continue b.i.d. Lasix.  She had some chronic renal insufficiency and is prone to diastolic dysfunction with  an  elevated BNP in the 300 range.  Demodex at 60 b.i.d. is reasonable.  The patient will need to have aggressive pulmonary toilet.   She will be ruled out for myocardial infarction.  At this point a 2-D  echocardiogram would not be needed unless her clinical status  deteriorates.   The patient should be on Lovenox SQ for DVT prophylaxis, particularly  given her cellulitis and the fact that her legs are wrapped and she is  morbidly obese with decreased mobility.   There is very low likelihood that her chest pain represents any angina.   The patient has had a history of pacemaker for sick sinus syndrome.  It  appears to be functioning normally with escape beats as needed.  We will  have this interrogated.  I do not think it is the source of her fever.   Further recommendation will be based on her clinical course.  However, I  suspect that the biggest issue here will be critical care medicine,  taking care of her pneumonia and very tenuous pulmonary status.      Noralyn Pick. Eden Emms, MD, [facility]  Electronically Signed     PCN/MEDQ  D:  02/17/2007  T:  02/17/2007  Job:  [id number]

## 2011-01-26 NOTE — Assessment & Plan Note (Signed)
[facility]                             PULMONARY OFFICE NOTE   NAME:[name], [name] CURD                       MRN:          [id number]  DATE:05/05/2007                            DOB:          01-02-1928    HISTORY OF PRESENT ILLNESS:  The patient is a 77 year old white female  patient of Dr. Lynelle Doctor with a known history of COPD that presents today  for an acute office visit.  The patient was recently hospitalized in  June for a pneumococcal pneumonia with vent dependent respiratory  failure and C. difficile negative diarrhea.  The patient currently has  been discharged and remains in [facility].  The  patient presents today complaining that over the last week she has had  increased chest congestion, productive cough with thick, yellow sputum.  The patient denies any hemoptysis, chest pain, orthopnea, PND, leg  swelling, fever.  The patient does complain that she has had some  intermittent dysuria over the last few days.  The patient on last visit  one month ago had a urinary tract infection and was treated with Cipro.  She reports her symptoms did improve but have now returned over the last  two days.   PAST MEDICAL HISTORY:  Past medical history was reviewed.   CURRENT MEDICATIONS:  1. Aspirin 81 mg daily.  2. Protonix 40 mg daily.  3. Cardizem 240 daily.  4. Demodex 5 mg daily.  5. Ferrous sulfate 325 mg b.i.d.  6. Zocor 40 mg q.h.s.  7. DuoNeb nebulizer q.6.  8. Colace 100 mg b.i.d.  9. Spiriva daily.  10.Phenergan p.r.n.   ALLERGIES:  1. PENICILLIN.  2. CODEINE.   PHYSICAL EXAMINATION:  The patient is an elderly, frail female in no  acute distress.  She is afebrile with stable vital signs.  O2 saturation is 97% on room  air.  HEENT:  Unremarkable.  NECK:  Supple without cervical adenopathy.  No JVD.  LUNGS:  Lung sounds reveal a few faint expiratory wheezes.  No rhonchi  or crackles are noted.  CARDIAC:  Regular  rate.  ABDOMEN:  Soft, obese, and nontender.  EXTREMITIES:  Warm without any edema.   IMPRESSION/PLAN:  Acute chronic obstructive pulmonary disease  exacerbation.  The patient is to begin Levaquin 750 mg daily x7 days.  Prednisone taper over the next week.  The patient will return back here  in one week with Dr. Delford Field or sooner if needed.      Rubye Oaks, NP  Electronically Signed      Charlcie Cradle Delford Field, MD, [facility]  Electronically Signed   TP/MedQ  DD: 05/05/2007  DT: 05/06/2007  Job #: [id number]

## 2011-01-26 NOTE — Discharge Summary (Signed)
[name], [name]                ACCOUNT NO.:  [id number]   MEDICAL RECORD NO.:  [id number]          PATIENT TYPE:  INP   LOCATION:  3029                         FACILITY:  [facility]   PHYSICIAN:  Marcellus Scott, MD     DATE OF BIRTH:  [birth year]   DATE OF ADMISSION:  01/31/2008  DATE OF DISCHARGE:  02/04/2008                               DISCHARGE SUMMARY   PRIMARY MEDICAL DOCTOR:  Dr. Jacalyn Lefevre of [facility].   CARDIOLOGIST:  Dr. Rollene Rotunda of [facility].   PULMONOLOGIST:  Dr. Shan Levans.   GASTROENTEROLOGIST:  Dr. Stan Head.   DISCHARGE DIAGNOSES:  1. Acute respiratory failure.  2. Acute exacerbation of chronic obstructive pulmonary disease.  3. Obstructive sleep apnea.  4. Chronic diastolic congestive heart failure.  5. Type 2 diabetes mellitus.  6. Chronic kidney disease -- ?stage IV.  7. Hypokalemia.  8. Anemia.  9. Leukocytosis.  10.Coronary artery disease.  11.Hyperlipidemia.   DISCHARGE MEDICATIONS:  1. Spiriva 18-mcg capsule inhaled daily.  2. Ferrous sulfate 325 mg p.o. b.i.d.  3. Colace 100 mg p.o. b.i.d.  4. Zocor 40 mg p.o. nightly.  5. Senokot-S 1 p.o. nightly.  6. Albuterol 2.5 mg nebulizations inhaled q.6 h. scheduled and q.2 h.      p.r.n.  7. Advair 250/50 Diskus one puff inhaled b.i.d.  8. Enteric-coated aspirin 81 mg p.o. daily.  9. Cardizem CD 240 mg p.o. daily.  10.NovoLog resistant sliding-scale insulin with nightly coverage.  11.Prednisone 30 mg p.o. daily from Feb 05, 2008 to Feb 07, 2008, then      prednisone 20 mg p.o. daily from Feb 08, 2008 to Feb 10, 2008, then      prednisone 10 mg p.o. daily from Feb 11, 2008 to February 13, 2008, then      discontinue.  12.Torsemide 40 mg p.o. daily.  13.Lantus 10 units subcutaneously nightly.  14.Potassium chloride 20 mEq p.o. on alternate days.  15.Levaquin 250 mg p.o. daily through Feb 05, 2008 and then      discontinue.  16.Phenergan 12.5 mg p.o. q.8 h. p.r.n.  17.CPAP nightly at 10 cmH2O pressure with O2.   PROCEDURES:  1. Chest x-ray on May 22:  Impression:  Bilateral lower lobe      atelectasis.  2. Chest x-ray on May 20:  Again, stable exam.  No acute chest      process.  3. Two-dimensional echocardiogram.  Summary:  Overall left ventricular      systolic function was normal.  Left ventricular ejection fraction      was estimated to be 60%.  There were no left ventricular regional      wall motion abnormalities.  Features were consistent with diastolic      dysfunction.  The Doppler parameters were consistent with high left      ventricular filling pressure.  Aortic valve thickness was mildly to      moderately increased.  Left atrial size was at the upper limits of      normal.   PERTINENT LABORATORY DATA:  Magnesium 2.5.  Basic metabolic  panel today  with glucose of 193, BUN 60, creatinine of 1.86.  CBC with hemoglobin  10.5, hematocrit 31.8, white blood cell count 12.8, platelets 270,000.  ABG on May 22 revealed pH 7.38, pCO2 38, pO2 83, bicarb 22, oxygen  saturation 96%.  Cardiac panel cycled and negative.  Hepatic panel only  remarkable for an albumin of 2.9.  BNP 77.  Urine microscopy not  suggestive of UTI.   CONSULTATIONS:  Pulmonology, Dr. Shan Levans.   HOSPITAL COURSE AND PATIENT DISPOSITION:  Please refer to the history  and physical note for initial admission details.  In summary, Ms.  Pieper is a very pleasant 77 year old Caucasian female patient with  history of congestive heart failure, COPD, on p.r.n. O2, type 2 DM,  which seems diet controlled, hypertension, hyperlipidemia, coronary  artery disease, status post pacemaker and chronic kidney disease, who  was transferred from assisted living with history of dyspnea which was  progressively getting worse; this was associated with some orthopnea and  PND.  She had seen Dr. Delford Field the day before and he indicates that her  respiratory status was stable, but she had  features suggestive of a  urinary tract infection on her UA and was started on oral Bactrim.  Further evaluation in the emergency room revealed the patient to be  afebrile and hemodynamically stable.  She was assessed to have COPD  exacerbation.  She was then admitted to the stepdown unit.  The patient  initially had to be on BiPAP, which she was quickly tapered off.   PROBLEM #1 - ACUTE RESPIRATORY FAILURE:  secondary to exacerbation of  chronic obstructive pulmonary disease, obstructive sleep apnea,  congestive heart failure, resolved.   PROBLEM #2 - ACUTE EXACERBATION OF CHRONIC OBSTRUCTIVE PULMONARY  DISEASE:  The patient was admitted to the step-down unit.  She was  placed on IV Solu-Medrol taper, which was subsequently changed to p.o.  prednisone.  She is to complete a course of empiric antibiotics.  She  was also continued on Advair and her nebulizations.  The patient has  done quite well and is stable at this time.   PROBLEM #3 - OBSTRUCTIVE SLEEP APNEA:  The patient is to continue her  nightly CPAP.  Ms. Huntley indicates that she has no sleep problems.  However, it has been explained to her that the CPAP is not for her  sleep, but for her breathing problems that occur during sleep.   PROBLEM #4 - CHRONIC DIASTOLIC CONGESTIVE HEART FAILURE:  Her Demadex  was increased from 20 mg daily to 40 mg p.o. daily.  Her echo shows good  LV function.  She has ruled out for an MI.  If her creatinine continues  to increase, then consider dropping her dose back to 20 mg daily.   PROBLEM #5 - TYPE 2 DIABETES MELLITUS:  She seemed to be diet controlled  as an outpatient; however, with her steroids, her glycemic control was  abnormal for which Lantus and sliding scale were added.  She might have  to continue on some form of diabetic agent, even after the steroids.  With the tapering and discontinuing of prednisone, her glycemic control  should improve and will require lesser amount insulin  and this has to be  adjusted as an outpatient.   PROBLEM #6 - HYPOKALEMIA PROBABLY SECONDARY TO DEMADEX:  To follow up  her basic metabolic panel in the next 2-3 days.   PROBLEM #7 - ANEMIA, WHICH IS STABLE:  Consider outpatient  workup as  deemed necessary.   PROBLEM #8 - LEUKOCYTOSIS:  Probably secondary to steroids and is  decreasing.   PROBLEM #9 - CORONARY ARTERY DISEASE:  Stable.   PROBLEM #10 - ? URINARY TRACT INFECTION:  This was probably partially  treated as an outpatient.  Levaquin should cover this.   PROBLEM #11 - The patient also had physical therapy evaluation done and  they indicated that she does not need any further PT.      Marcellus Scott, MD  Electronically Signed     AH/MEDQ  D:  02/04/2008  T:  02/04/2008  Job:  [id number]   cc:   Bertram Millard. Hyacinth Meeker, M.D.  Charlcie Cradle Delford Field, MD, [facility]  Rollene Rotunda, MD, [facility]  Iva Boop, MD,FACG

## 2011-01-27 ENCOUNTER — Encounter: Payer: Self-pay | Admitting: Critical Care Medicine

## 2011-01-27 ENCOUNTER — Ambulatory Visit (INDEPENDENT_AMBULATORY_CARE_PROVIDER_SITE_OTHER): Payer: Medicare Other | Admitting: Critical Care Medicine

## 2011-01-27 DIAGNOSIS — J441 Chronic obstructive pulmonary disease with (acute) exacerbation: Secondary | ICD-10-CM

## 2011-01-27 DIAGNOSIS — E119 Type 2 diabetes mellitus without complications: Secondary | ICD-10-CM

## 2011-01-27 NOTE — Patient Instructions (Signed)
No change in medications. Return in        2 months 

## 2011-01-27 NOTE — Progress Notes (Signed)
Subjective:    Patient ID: [name], female    DOB: May 21, 1928, 77 y.o.   MRN: [id number]  HPI  Subjective:    Patient ID: [name], female    DOB: 04/05/1928, 77 y.o.   MRN: [id number]  HPI  82yowf quit smoking in 1987 and chronic renal insufficiency with coronary artery disease. The patient had history of congestive heart failure and diabetes. The patient has been hospitalized on numerous occasions for respiratory failure and pneumonia. Patient currently is residing in a nursing care facility.  June 09, 2010 2:43 PM  Doing ok. Still dyspneic with exertion and heavy in the chest area. Notes some mucus and is white and thick. Still edematous in the feet. Is ambulatory . August 12, 2010 2:16 PM  The pt notes more dyspnea esp with exertion. The pt notes more cough. The mucus is prod white thick. There is no discolored mucus. The edema in feet the same, feels more bloated.  Pt denies any significant sore throat, nasal congestion or excess secretions, fever, chills, sweats, unintended weight loss, pleurtic or exertional chest pain, orthopnea PND, or leg swelling  Pt denies any increase in rescue therapy over baseline, denies waking up needing it or having any early am or nocturnal exacerbations of coughing/wheezing/or dyspnea.  October 12, 2010 3:30 PM  Has good and bad days. Recently: notes more dyspnea with any exertion. Notes some mucus and is thick and grey/white mucus.  No real chest pain. Notes some wheeze. Notes some edema in feet and legs are with a rash. No real fever.   12/30/2010 Pt feels worse, sleeps all the time, no energy, unable to walk and feels heavy. Notes more mucus.  Mucus is white and thick and yellow.   No fever.  No chest pain.   01/27/2011 Pt better.  Rx bactrim and pred pulse at last ov 12/28/10.    Pt denies any significant sore throat, nasal congestion or excess secretions, fever, chills, sweats, unintended weight loss, pleurtic or exertional chest  pain, orthopnea PND, or leg swelling Pt denies any increase in rescue therapy over baseline, denies waking up needing it or having any early am or nocturnal exacerbations of coughing/wheezing/or dyspnea. Pt also denies any obvious fluctuation in symptoms with  weather or environmental change or other alleviating or aggravating factors   Past Medical History  Diagnosis Date  . COPD (chronic obstructive pulmonary disease)   . Diastolic congestive heart failure   . C. difficile colitis   . Hypertension   . Hyperlipidemia   . CAD (coronary artery disease)   . Anemia   . Type 2 diabetes mellitus   . Pacemaker 2002  . Cardiac arrest - asystole 2002  . Gallstones   . Pulmonary hypertension   . Renal insufficiency   . Osteoarthritis   . Allergic rhinitis   . Gout      Family History  Problem Relation Age of Onset  . Diabetes Mother   . Hypertension Mother   . Heart disease Father      History   Social History  . Marital Status: Widowed    Spouse Name: N/A    Number of Children: N/A  . Years of Education: N/A   Occupational History  . Retired     Hospital doctor   Social History Main Topics  . Smoking status: Former Smoker -- 2.0 packs/day for 25 years    Types: Cigarettes    Quit date: 09/13/1985  . Smokeless tobacco: Never  Used  . Alcohol Use: No  . Drug Use: Not on file  . Sexually Active: Not on file   Other Topics Concern  . Not on file   Social History Narrative   Resides at [facility]     Allergies  Allergen Reactions  . Codeine     REACTION: unspecified  . Penicillins     REACTION: unspecified     Outpatient Prescriptions Prior to Visit  Medication Sig Dispense Refill  . albuterol (PROVENTIL) (2.5 MG/3ML) 0.083% nebulizer solution Take 2.5 mg by nebulization 4 (four) times daily.        Marland Kitchen allopurinol (ZYLOPRIM) 100 MG tablet Take 100 mg by mouth 2 (two) times daily.        Marland Kitchen aspirin 81 MG tablet Take 81 mg by mouth daily.        . budesonide  (PULMICORT) 0.25 MG/2ML nebulizer solution Take 0.25 mg by nebulization 4 (four) times daily.        Marland Kitchen dextromethorphan (DELSYM) 30 MG/5ML liquid Take 60 mg by mouth every 12 (twelve) hours as needed.        . diltiazem (DILACOR XR) 240 MG 24 hr capsule Take 240 mg by mouth daily.        . ferrous sulfate 325 (65 FE) MG tablet Take 325 mg by mouth 2 (two) times daily.        . insulin glargine (LANTUS) 100 UNIT/ML injection Inject 15 Units into the skin at bedtime.        Marland Kitchen lactulose (CHRONULAC) 10 GM/15ML solution 30 mls by mouth daily- hold for diarrhea       . lidocaine (LIDODERM) 5 % Place 1 patch onto the skin daily. Remove & Discard patch within 12 hours or as directed by MD       . loratadine (CLARITIN) 10 MG tablet Take 10 mg by mouth daily as needed.        Marland Kitchen omeprazole (PRILOSEC) 20 MG capsule Take 20 mg by mouth daily.        . Polyvinyl Alcohol (AKWA TEARS OP) Apply 2 drops to eye 4 (four) times daily.       . potassium chloride SA (K-DUR,KLOR-CON) 20 MEQ tablet Take 20 mEq by mouth 4 (four) times daily.        . rosuvastatin (CRESTOR) 20 MG tablet Take 20 mg by mouth daily.        . sennosides-docusate sodium (SENOKOT-S) 8.6-50 MG tablet Take 1 tablet by mouth at bedtime.        Marland Kitchen tiotropium (SPIRIVA) 18 MCG inhalation capsule Place 18 mcg into inhaler and inhale daily.        Marland Kitchen torsemide (DEMADEX) 20 MG tablet Take 2 tablets two times daily      . LORazepam (ATIVAN) 0.5 MG tablet 1/2 to 1 tablet twice a day as needed       . promethazine (PHENERGAN) 25 MG tablet Take 12.5 mg by mouth every 6 (six) hours as needed.        . traMADol (ULTRAM) 50 MG tablet Take 50 mg by mouth every 6 (six) hours as needed.           Review of Systems Constitutional:   No  weight loss, night sweats,  Fevers, chills, fatigue, lassitude. HEENT:   No headaches,  Difficulty swallowing,  Tooth/dental problems,  Sore throat,                No sneezing, itching, ear ache,  nasal congestion, post nasal  drip,   CV:  No chest pain,  Orthopnea, PND,notes  swelling in lower extremities, no anasarca, dizziness, palpitations  GI  No heartburn, indigestion, abdominal pain, nausea, vomiting, diarrhea, change in bowel habits, loss of appetite  Resp: Notes  shortness of breath with exertion and  at rest.  Notes  excess mucus, notes  productive cough,  No non-productive cough,  No coughing up of blood.  Notes  change in color of mucus.  No wheezing.  No chest wall deformity  Skin: no rash or lesions.  GU: no dysuria, change in color of urine, no urgency or frequency.  No flank pain.  MS:  No joint pain or swelling.  No decreased range of motion.  No back pain.  Psych:  No change in mood or affect. No depression or anxiety.  No memory loss.      Objective:   Physical Exam Gen: obese, WF , in no distress,  normal affect  ENT: No lesions,  mouth clear,  oropharynx clear, no postnasal drip  Neck: No JVD, no TMG, no carotid bruits  Lungs: No use of accessory muscles, no dullness to percussion, distant bs   Cardiovascular: RRR, heart sounds normal, no murmur or gallops, no peripheral edema  Abdomen: soft and NT, no HSM,  BS normal  Musculoskeletal: No deformities, no cyanosis or clubbing  Neuro: alert, non focal  Skin: cellulitis LE        Assessment & Plan:   STASIS DERMATITIS Flare of dermatitis with early cellulitis Plan  Bactrim DS bid x 10days   CHRONIC OBSTRUCTIVE PULMONARY DISEASE, ACUTE EXACERBATION Acute tracheobronchitis and cellulitis Plan BactrimDS x 10days bid Pulse prednisone    Updated Medication List Outpatient Encounter Prescriptions as of 12/30/2010  Medication Sig Dispense Refill  . albuterol (PROVENTIL) (2.5 MG/3ML) 0.083% nebulizer solution Take 2.5 mg by nebulization 4 (four) times daily.        Marland Kitchen allopurinol (ZYLOPRIM) 100 MG tablet Take 100 mg by mouth 2 (two) times daily.        Marland Kitchen aspirin 81 MG tablet Take 81 mg by mouth daily.        .  budesonide (PULMICORT) 0.25 MG/2ML nebulizer solution Take 0.25 mg by nebulization 4 (four) times daily.        Marland Kitchen dextromethorphan (DELSYM) 30 MG/5ML liquid Take 60 mg by mouth every 12 (twelve) hours as needed.        . diltiazem (DILACOR XR) 240 MG 24 hr capsule Take 240 mg by mouth daily.        . ferrous sulfate 325 (65 FE) MG tablet Take 325 mg by mouth 2 (two) times daily.        . insulin aspart (NOVOLOG) 100 UNIT/ML injection Inject 6 Units into the skin 3 (three) times daily before meals.        . insulin aspart (NOVOLOG) 100 UNIT/ML injection SSI before meals and at bedtime       . insulin glargine (LANTUS) 100 UNIT/ML injection Inject 15 Units into the skin at bedtime.        Marland Kitchen lactulose (CHRONULAC) 10 GM/15ML solution 30 mls by mouth daily- hold for diarrhea       . lidocaine (LIDODERM) 5 % Place 1 patch onto the skin daily. Remove & Discard patch within 12 hours or as directed by MD       . loratadine (CLARITIN) 10 MG tablet Take 10 mg by mouth daily as needed.        Marland Kitchen  omeprazole (PRILOSEC) 20 MG capsule Take 20 mg by mouth daily.        . Polyvinyl Alcohol (AKWA TEARS OP) Apply 2 drops to eye 4 (four) times daily.       . potassium chloride SA (K-DUR,KLOR-CON) 20 MEQ tablet Take 20 mEq by mouth 4 (four) times daily.        . rosuvastatin (CRESTOR) 20 MG tablet Take 20 mg by mouth daily.        . sennosides-docusate sodium (SENOKOT-S) 8.6-50 MG tablet Take 1 tablet by mouth at bedtime.        Marland Kitchen tiotropium (SPIRIVA) 18 MCG inhalation capsule Place 18 mcg into inhaler and inhale daily.        Marland Kitchen torsemide (DEMADEX) 20 MG tablet Take 2 tablets two times daily      . predniSONE (DELTASONE) 10 MG tablet Take 4 for three days, 3 for three days, 2 for three days, 1 for three days and stop   30 tablet  0  . sulfamethoxazole-trimethoprim (SEPTRA DS) 800-160 MG per tablet Take 1 tablet by mouth 2 (two) times daily.  20 tablet  0  . DISCONTD: LORazepam (ATIVAN) 0.5 MG tablet 1/2 to 1 tablet  twice a day as needed       . DISCONTD: promethazine (PHENERGAN) 25 MG tablet Take 12.5 mg by mouth every 6 (six) hours as needed.        Marland Kitchen DISCONTD: traMADol (ULTRAM) 50 MG tablet Take 50 mg by mouth every 6 (six) hours as needed.              Review of Systems Constitutional:   No  weight loss, night sweats,  Fevers, chills, fatigue, lassitude. HEENT:   No headaches,  Difficulty swallowing,  Tooth/dental problems,  Sore throat,                No sneezing, itching, ear ache, nasal congestion, post nasal drip,   CV:  No chest pain,  Orthopnea, PND, swelling in lower extremities, anasarca, dizziness, palpitations  GI  No heartburn, indigestion, abdominal pain, nausea, vomiting, diarrhea, change in bowel habits, loss of appetite  Resp: Notes  shortness of breath with exertion not  at rest.  No excess mucus, no productive cough,  No non-productive cough,  No coughing up of blood.  No change in color of mucus.  No wheezing.  No chest wall deformity  Skin: no rash or lesions.  GU: no dysuria, change in color of urine, no urgency or frequency.  No flank pain.  MS:  No joint pain or swelling.  No decreased range of motion.  No back pain.  Psych:  No change in mood or affect. No depression or anxiety.  No memory loss.     Objective:   Physical Exam Filed Vitals:   01/27/11 1431  BP: 106/64  Pulse: 82  Temp: 98.3 F (36.8 C)  TempSrc: Oral  Height: 4\' 11"  (1.499 m)  Weight: 188 lb 6.4 oz (85.458 kg)  SpO2: 98%    Gen: Pleasant, well-nourished, in no distress,  normal affect  ENT: No lesions,  mouth clear,  oropharynx clear, no postnasal drip  Neck: No JVD, no TMG, no carotid bruits  Lungs: No use of accessory muscles, no dullness to percussion, distant bs   Cardiovascular: RRR, heart sounds normal, no murmur or gallops, no peripheral edema  Abdomen: soft and NT, no HSM,  BS normal  Musculoskeletal: No deformities, no cyanosis or clubbing  Neuro: alert, non  focal  Skin: Warm, no lesions or rashes        Assessment & Plan:   CHRONIC OBSTRUCTIVE PULMONARY DISEASE, ACUTE EXACERBATION Recent copd exacerbation, now improved Plan No change in inhaled or maintenance medications. Return in   2 months    Updated Medication List Outpatient Encounter Prescriptions as of 01/27/2011  Medication Sig Dispense Refill  . albuterol (PROVENTIL) (2.5 MG/3ML) 0.083% nebulizer solution Take 2.5 mg by nebulization 4 (four) times daily.        Marland Kitchen allopurinol (ZYLOPRIM) 100 MG tablet Take 100 mg by mouth 2 (two) times daily.        Marland Kitchen aspirin 81 MG tablet Take 81 mg by mouth daily.        . budesonide (PULMICORT) 0.25 MG/2ML nebulizer solution Take 0.25 mg by nebulization 4 (four) times daily.        Marland Kitchen dextromethorphan (DELSYM) 30 MG/5ML liquid Take 60 mg by mouth every 12 (twelve) hours as needed.        . diltiazem (DILACOR XR) 240 MG 24 hr capsule Take 240 mg by mouth daily.        . fluocinonide (LIDEX) 0.05 % cream Apply as directed       . insulin aspart (NOVOLOG) 100 UNIT/ML injection Inject 6 Units into the skin 3 (three) times daily before meals.        . insulin aspart (NOVOLOG) 100 UNIT/ML injection SSI before meals and at bedtime       . insulin glargine (LANTUS) 100 UNIT/ML injection Inject 15 Units into the skin at bedtime.        . isosorbide mononitrate (IMDUR) 30 MG 24 hr tablet Take 30 mg by mouth daily. 1/2 daily       . lactulose (CHRONULAC) 10 GM/15ML solution 30 mls by mouth daily- hold for diarrhea       . lidocaine (LIDODERM) 5 % Place 1 patch onto the skin daily. Remove & Discard patch within 12 hours or as directed by MD       . loratadine (CLARITIN) 10 MG tablet Take 10 mg by mouth daily as needed.        Marland Kitchen omeprazole (PRILOSEC) 20 MG capsule Take 20 mg by mouth daily.        . Polyvinyl Alcohol (AKWA TEARS OP) Apply 2 drops to eye 4 (four) times daily.       . rosuvastatin (CRESTOR) 20 MG tablet Take 20 mg by mouth daily.        .  sennosides-docusate sodium (SENOKOT-S) 8.6-50 MG tablet Take 1 tablet by mouth at bedtime.        Marland Kitchen spironolactone (ALDACTONE) 25 MG tablet Take 25 mg by mouth 2 (two) times daily.        Marland Kitchen tiotropium (SPIRIVA) 18 MCG inhalation capsule Place 18 mcg into inhaler and inhale daily.        Marland Kitchen torsemide (DEMADEX) 20 MG tablet Take 2 tablets two times daily      . DISCONTD: potassium chloride SA (K-DUR,KLOR-CON) 20 MEQ tablet Take 20 mEq by mouth 4 (four) times daily.        Marland Kitchen DISCONTD: ferrous sulfate 325 (65 FE) MG tablet Take 325 mg by mouth 2 (two) times daily.

## 2011-01-28 ENCOUNTER — Encounter: Payer: Self-pay | Admitting: Critical Care Medicine

## 2011-01-28 NOTE — Assessment & Plan Note (Signed)
Recent copd exacerbation, now improved Plan No change in inhaled or maintenance medications. Return in   2 months

## 2011-01-29 NOTE — Assessment & Plan Note (Signed)
[facility]                               PULMONARY OFFICE NOTE   NAME:[name], [name]                       MRN:          [id number]  DATE:05/10/2006                            DOB:          April 27, 1928    [name] is a 77 year old white female with a history of a chronic  obstructive lung disease, asthmatic bronchitis, congestive failure, was  hospitalized in June and August for pneumonia, left lower lobe.  She is  improved with decreased shortness of breath.  Has improvement in edema in  the lower extremities.  The patient is maintained on Flovent 2 sprays  b.i.d., 220 mcg strength; Spiriva daily; nebulized albuterol q.6; oxygen 2L  continuous.   ON EXAM:  VITAL SIGNS:  Temperature 98.  Blood pressure 110/70.  Pulse 82.  Saturation 99% on 2L.  CHEST:  Showed distant breath sounds with prolonged respiratory phase.  No  wheeze or rhonchi.  CARDIAC EXAM:  Showed a regular rate and rhythm without S3.  Normal S1, S2.  ABDOMEN:  Soft, nontender.  EXTREMITIES:  Showed no edema or clubbing.  SKIN:  Clear.   Chest x-ray was obtained, showed clearance of left lower lobe infiltrate.  No edema.   IMPRESSION:  Chronic obstructive lung disease with resolved left lower lobe  pneumonia, cor pulmonale, and pedal edema.   PLAN:  Increase Lasix to 40 mg b.i.d.  Increase potassium to 20 mEq b.i.d.  Maintain oxygen at 2L, Flovent at 2 sprays b.i.d., Spiriva daily, nebulized  therapy 4 times daily and will see the patient back in follow up in 2  months.                                   Charlcie Cradle Delford Field, MD, [facility]   PEW/MedQ  DD:  05/11/2006  DT:  05/11/2006  Job #:  [id number]

## 2011-01-29 NOTE — Assessment & Plan Note (Signed)
[facility]                          CHRONIC HEART FAILURE NOTE   NAME:[name], [name]                       MRN:          [id number]  DATE:09/02/2006                            DOB:          08-Dec-1927    [name] returns today for further evaluation and medication  titration of her congestive heart failure which is diastolic in nature.   I saw [name] last in September at which time I had started her back  on her Demadex 40 mg b.i.d. She was showing signs of volume overload at  that time. She has since that time followed up with pulmonary regarding  her COPD and asthmatic bronchitis. She has been treated with  prednisone,DuoNebs and Flovent. She returns today complaining of  increased congestion in her chest, cold-like symptoms and fluid. She  states she feels awful all over and needs some help with her breathing.  I checked the pulse ox on her, it shows 100% on her 2 liters which she  wears continuously. She denies any increased peripheral edema, orthopnea  or PND.   PAST MEDICAL HISTORY:  1. Congestive heart failure, diastolic in nature with an EF of 60%.  2. Hypertension.  3. COPD with asthmatic bronchitis.  4. Coronary artery disease status post PCI with stenting of the LAD in      December 2002. Most recent cardiac catheterization in April 2006      showed an EF of 65%. The LAD had a 40% ostial stenosis and there      was a 40-50% InStent restenosis of the LAD.  5. Morbid obesity.  6. Presumed asystolic arrest status post permanent pacemaker in July      2002.  7. LVH.  8. Mild pulmonary hypertension with pulmonary pressure of 30-40.  9. Diabetes.  10.Hyperlipidemia.  11.Stasis dermatitis.   REVIEW OF SYSTEMS:  As stated above otherwise negative.   CURRENT MEDICATIONS:  1. Flovent.  2. Prednisone 10 mg.  3. Spiriva.  4. Aspirin 81.  5. Allopurinol 300.  6. Altace 5.  7. Cardizem LA 180.  8. Zocor 40.  9. O2 2 liters.  10.Lasix 40 b.i.d.  11.K-Dur 20 mEq b.i.d.  12.DuoNebs 4 times a day.  13.Humalog insulin.  14.Demodex 60 mg b.i.d.  15.Zaroxolyn 2.5 mg daily.   PHYSICAL EXAMINATION:  VITAL SIGNS:  Weight 183 pounds, blood pressure  148/77 with a pulse of 98.  GENERAL:  The patient is in no acute distress. She is coughing.  NECK:  No jugular vein distention at 90 degree angle.  LUNGS:  She has scattered rhonchi and rales right base. Decreased breath  sounds in the left base.  CARDIOVASCULAR:  Reveals an S1, S2, regular rate and rhythm.  ABDOMEN:  Soft, nontender, positive bowel sounds.  LOWER EXTREMITIES:  She has chronic vascular insufficiency/dermatitis,  +1 chronic edema, nonpitting.   IMPRESSION:  [name] does not appear to be in volume overload at  this time. I feel that her breathing is related to her lung disease not  her heart failure at this time. Chest x-ray was obtained that  showed a  left pleural effusion. No signs of fluid. I also had Dr. Gala Romney look  at her and her x-ray also. I am going to have patient followup with  pulmonary. I will see if I can get her in today as she was just recently  treated with antibiotics earlier this month for respiratory symptoms and  I will see her back in 4-6 weeks. Her pulmonologist is Dr Delford Field. I have  called his office and [name] will be seeing his NP, [name] this  afternoon.      Dorian Pod, ACNP  Electronically Signed      Bevelyn Buckles. Bensimhon, MD  Electronically Signed   MB/MedQ  DD: 09/02/2006  DT: 09/03/2006  Job #: [id number]

## 2011-01-29 NOTE — H&P (Signed)
NAMEALELI, NAVEDO                ACCOUNT NO.:  [id number]   MEDICAL RECORD NO.:  [id number]          PATIENT TYPE:  INP   LOCATION:  1428                         FACILITY:  [facility]   PHYSICIAN:  Georgina Quint. Plotnikov, MDDATE OF BIRTH:  [birth year]   DATE OF ADMISSION:  07/11/2006  DATE OF DISCHARGE:                                HISTORY & PHYSICAL   CHIEF COMPLAINT:  Shortness of breath, cough.   HISTORY OF PRESENT ILLNESS:  The patient is a 77 year old female with  progressive worsening of shortness of breath and cough productive for yellow  sputum over past several days.  She has been getting progressively worse.  Denies chest pain or syncope.  Was brought to the emergency room and I was  called to admit after initial workup.  She was given a dose of IV Zithromax.   PAST MEDICAL HISTORY:  1. Congestive heart failure.  2. Gout.  3. Type 2 diabetes, likely steroid-induced.  4. Atrial fibrillation.  5. Hypertension.  6. Status post pacemaker placement.  7. COPD.  8. Obesity.  9. Arthritis.   ALLERGIES:  PENICILLIN, CODEINE.   CURRENT MEDICATIONS:  1. Starlix 60 mg one at breakfast, one at lunch and two at supper.  2. Demadex 80 mg daily (recent increase).  3. Topicort 0.25% ointment left leg p.r.n.  4. Prednisone 10 mg daily.  5. Spiriva one capsule daily.  6. Aspirin 81 mg daily.  7. Allopurinol 300 mg daily.  8. Altace 5 mg daily.  9. K-Dur 20 mEq daily.  10.Cardizem LA 180 mg daily.  11.Zocor 40 mg daily.  12.Ultram 50 mg p.r.n.  13.Phenergan 12.5 mg p.r.n.  14.Tylenol p.r.n.   SOCIAL HISTORY:  Lives at an assisted living place.  Lives alone.  Her  family is in [place].  Quit smoking in 1989.  No alcohol.   FAMILY HISTORY:  Positive for coronary disease.   REVIEW OF SYSTEMS:  Has been short of breath, weak, tired, with occasional  nausea.  Cough as above.  No chest pain.  No syncopal spells.  No leg  swelling.  The rest is as above or negative.   PHYSICAL  EXAMINATION:  VITAL SIGNS:  Temperature 99.3, 127/60, heart rate  113, respirations 24, saturation 90% on 2 L.  GENERAL:  She is in no acute distress.  Dyspneic with exertion.  No pallor  or cyanosis.  HEENT:  With moist mucosa.  NECK:  Supple, no meningeal signs, no bruit.  LUNGS:  Rhonchi bilaterally.  HEART:  S1, S2, no gallop, regular rhythm with occasional irregular beats.  ABDOMEN:  Soft, nontender, no organomegaly, no mass felt.  EXTREMITIES:  Lower extremities with trace edema, ankles nontender.  PSYCHIATRIC:  She is alert and cooperative, denies being depressed.  NEUROLOGIC:  Cranial nerves II-XII nonfocal.  Deep tendon reflexes and  muscle strength nonfocal.  SKIN:  Aging changes.   LABORATORY DATA:  White count 14.5, hemoglobin 9.6, MCV 85.1.  Sodium 137,  potassium 4.2, glucose 166, BUN 37, creatinine 1.8.  Urinalysis negative.  Chest x-ray with vascular congestion.   ASSESSMENT AND PLAN:  1. Possible bronchial pneumonia.  She was given IV Zithromax.  I will      start IV Avelox.  2. Chronic obstructive pulmonary disease, steroid and oxygen-dependent.      Continue with oxygen.  Titrate to keep saturations greater than 92%.      Xopenex/Atrovent via hand-held nebulizer.  IV steroids.  3. Type 2 diabetes.  Will give basilar Lantus and sliding scale.  Will      restart oral medicines lately.  4. Congestive heart failure.  Will treat her with IV Lasix at high dose.  5. Atrial fibrillation.  Currently in sinus rhythm.  Will check EKG.  6. Chronic renal failure.  7. Anemia, unclear etiology.  Will recheck hemoglobin tomorrow.  8. Elevated white count, likely due to steroids.  9. Gout.  No active complaints.  10.DVT prophylaxis with Lovenox.           ______________________________  Georgina Quint Plotnikov, MD     AVP/MEDQ  D:  07/11/2006  T:  07/12/2006  Job:  [id number]   cc:   Gregary Signs A. Everardo All, MD  [address]  [place]  [place] [address]

## 2011-01-29 NOTE — Discharge Summary (Signed)
[place]. [facility]  Patient:    [name], [name] Visit Number: [id number] MRN: [id number]          Service Type: MED Location: 2000 2021 01 Attending Physician:  Learta Codding Dictated by:   Chinita Pester, N.P. Admit Date:  09/10/2001 Discharge Date: 09/13/2001                             Discharge Summary  PRIMARY DIAGNOSIS:  INCOMPLETE DICTATION. Dictated by:   Chinita Pester, N.P. Attending Physician:  Learta Codding DD:  10/25/01 TD:  10/25/01 Job: [id number]

## 2011-01-29 NOTE — Cardiovascular Report (Signed)
[name], [name]                ACCOUNT NO.:  [id number]   MEDICAL RECORD NO.:  [id number]          PATIENT TYPE:  OIB   LOCATION:  6501                         FACILITY:  [facility]   PHYSICIAN:  Rollene Rotunda, M.D.   DATE OF BIRTH:  Jul 15, 1928   DATE OF PROCEDURE:  12/15/2004  DATE OF DISCHARGE:                              CARDIAC CATHETERIZATION   PRIMARY CARE PHYSICIAN:  Dr. Romero Belling   PROCEDURE:  Left heart catheterization/coronary arteriography.   INDICATIONS:  Evaluate patient with chest pain on exertion and dyspnea.  She  had a history of stenting of her LAD in 2002.   PROCEDURE NOTE:  Left heart catheterization was performed via the right  femoral artery.  The artery was cannulated using anterior wall puncture.  A  #4-French arterial sheath was inserted via the modified Seldinger technique.  Preformed Judkins and a pigtail catheter were utilized.  The patient  tolerated the procedure well and left the laboratory in stable condition.   HEMODYNAMICS:  LV 185/23, AO 170/72.   CORONARIES:  Left main was normal.  The LAD had ostial 40% stenosis.  There  was a proximal stent which was patent.  There was a long 40-50% in-stent  restenosis.  The small first diagonal had ostial 60% stenosis.  A small  second diagonal was normal.  Circumflex in the AV groove had luminal  irregularities.  There was a ramus intermediate which was small with ostial  60% or greater stenosis.  Mid obtuse marginal was large and branching with  mild luminal irregularities.  Second obtuse marginal and first  posterolateral was small and normal.  The right coronary artery was a  dominant vessel with a moderate sized PDA.  There was spasm of the ostium,  but no fixed lesion.  The remainder of the vessel had some mild luminal  irregularities.   LEFT VENTRICULOGRAM:  The left ventriculogram was obtained in the RAO  projection.  The EF was 65% with normal wall motion.   CONCLUSION:  Nonobstructive  coronary artery disease.  The patient will  continue to have medical management, follow-up by Dr. Graciela Husbands and Dr. Everardo All.  If she continues to have discomfort that is without another etiology and  suggestive of coronary disease, might suggest a Cardiolite on the outside  possibility that the mid LAD lesion is more significant than it looks  angiographically.      JH/MEDQ  D:  12/15/2004  T:  12/15/2004  Job:  [id number]   cc:   Gregary Signs A. Everardo All, M.D. [facility]

## 2011-01-29 NOTE — Assessment & Plan Note (Signed)
[facility]                             PULMONARY OFFICE NOTE   [name], [name]                       MRN:          [id number]  DATE:01/02/2007                            DOB:          15-Oct-1927    Ms. Servidio is seen today as a workin.  She is a 77 year old white  female with history of advanced chronic obstructive lung disease and  congestive failure.  She has had increased dyspnea for 24 hours.  She is  short of breath with minimal activity, coughing up thick white mucus for  3 days.  The patient maintains:  1. Starlix 60 mg 3 times a day.  2. Allopurinol 300 mg daily.  3. Aspirin 81 mg daily.  4. Spiriva daily.  5. Prednisone is off.  6. Cardizem LA 180 mg daily.  7. Flovent 220 mcg strength 2 sprays b.i.d.  8. Protonix 40 mg daily.  9. DuoNeb 4 times a day.  10.Potassium 20 mEq b.i.d.  11.Demadex 60 mg b.i.d.  12.Zocor 40 mg daily.   PHYSICAL EXAMINATION:  VITAL SIGNS:  Temperature 99.5, blood pressure  130/70, pulse 68, saturations 98% on 2 liters.  Weight is 191 pounds.  GENERAL: Obese white female in no distress.  CHEST:  Shows inspiratory and expiratory wheeze, scattered rhonchi,  rales at the bases.  CARDIAC:  Exam showed a resting regular rate and rhythm without S3.  Normal S1 and S2.  ABDOMEN:  Protuberant.  Bowel sounds were active.  EXTREMITIES:  Showed no edema, clubbing, or venous disease.  SKIN:  Was clear.  NEUROLOGIC: Exam was intact.  HEENT/NECK:  Exam showed no jugular venous distention, lymphadenopathy.  Oropharynx clear.  Neck supple.   IMPRESSION:  Congestive failure with asthmatic bronchitic flare.   PLAN:  1. Increase Demadex to 80 mg b.i.d. for 4 days, then reduce back to 60      mg b.i.d. dosing.  2. The patient will receive prednisone 40 mg a day, taper down 10 mg      every 4 days until off.  3. Return to this office in 6 weeks.     Charlcie Cradle Delford Field, MD, [facility]  Electronically Signed    PEW/MedQ   DD: 01/02/2007  DT: 01/02/2007  Job #: [id number]   cc:   Rollene Rotunda, MD, [facility]

## 2011-01-29 NOTE — Discharge Summary (Signed)
[place]. [facility]  Patient:    [name], [name] Visit Number: [id number] MRN: [id number]          Service Type: MED Location: 2000 2021 01 Attending Physician:  Learta Codding Dictated by:   Chinita Pester, N.P. Admit Date:  09/10/2001 Discharge Date: 09/13/2001                             Discharge Summary  ADDENDUM  She says to please add that the dictation was done Chinita Pester, N.P. for Dr. Sherryl Manges.  She says to send copies to Dr. Graciela Husbands at [facility], Dr. Luciana Axe, as well as the device clinic at [facility]. Dictated by:   Chinita Pester, N.P. Attending Physician:  Learta Codding DD:  10/25/01 TD:  10/26/01 Job: [id number]

## 2011-01-29 NOTE — Assessment & Plan Note (Signed)
[facility]                          CHRONIC HEART FAILURE NOTE   NAME:[name] [name] SPRECHER                       MRN:          [id number]  DATE:01/09/2007                            DOB:          11/20/1927    [name] [name] returns today for followup of her congestive heart failure which is  diastolic in nature.  I last saw [name] [name] earlier this year.  She has since  that time been seen by Pulmonary for re-evaluation of COPD.  She has  also been seen by Dr. Everardo All for lower extremity cellulitis.  When I  saw her April 1 her weight was up 6 pounds with extreme fluid retention  in her lower legs in the setting of cellulitis.  She states the  cellulitis is doing much better now.  She saw Dr. Delford Field last week.  She  returns today complaining that she does not feel good, a low grade fever  (99.2), chills.  [name] [name] states she is having a productive cough over the  last 2 days, a thick yellow-green tinged sputum, increased coughing  spells, not resting well, increased shortness of breath.  She is wearing  her oxygen at 2 liters and sating 97%.  She has an appointment with Dr.  Yetta Barre over on [address] to recheck her lower extremities and has an  appointment scheduled with Dr. Delford Field for May 12.  However, in past  experience with [name] [name], she does not do well when she becomes symptomatic  with her respiratory issues and eventually ends up being admitted. [name] [name]  is currently on Avelox.   PAST MEDICAL HISTORY:  1. Congestive heart failure, diastolic in nature, with an EF of 65%.      Cardiac catheterization in 2006 showed nonobstructive CAD.  2. Mild pulmonary hypertension.  Pulmonary pressures 30-40 mmHg.  3. Coronary artery disease, status post cardiac catheterization as      showed above.  The patient underwent PTCA of the LAD in December      2002.  4. Obesity.  5. 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 Graciela Husbands in 2002 (Medtronic  Kappa 901 pulse      generator).  6. Syncope, secondary to number 5.  7. Hypertension.  8. COPD requiring home O2, with multiple hospitalizations for      pneumonia and bronchitis with previous positive Pseudomonas sputum      culture.  9. Type 2 diabetes.  10.Chronic steroid use.  11.Chronic stasis dermatitis, lower extremities.  12.Questionable history of atrial fibrillation.  13.Gout.  14.Osteoarthritis.  15.Remote history of tobacco use.  16.Dyslipidemia.  17.Chronic renal insufficiency.   REVIEW OF SYSTEMS:  As stated above.   ALLERGIES:  INCLUDE CODEINE AND PENICILLIN.   CURRENT MEDICATIONS:  1. Diltiazem CD 180 mg daily.  2. Fluticasone ointment to legs daily.  3. Protonix 40 mg daily.  4. Spiriva inhaler daily.  5. Aspirin 81 daily.  6. Allopurinol 300 daily.  7. Lantus 15 units subcutaneous daily.  8. NovoLog insulin 10 units subcutaneous at breakfast, 13 units at  lunch, and 7 units with supper.  9. Lidex cream 3 times a day to itching on the legs.  10.Prednisone, patient currently is on a taper dose at 30 mg daily for      3 days, then 20 daily for 3 days, then 10 mg daily for 3 days.  11.Avelox 400 mg once a day for 7 days.  12.Enulose 10 cc p.o. b.i.d.  13.Tramadol 50 mg b.i.d.  14.Klor-Con 20 mEq b.i.d.  15.Demadex, patient is currently taking her 60 mg p.o. b.i.d.      Previously dosing had been increased to 80 mg b.i.d. for 4 days,      this ended on the 25th.  16.Flovent inhaler 2 puffs b.i.d.  17.DuoNebs q.i.d.  18.Simvastatin 40 mg daily.   PHYSICAL EXAMINATION:  Weight 190; blood pressure initially recorded at  168/83, manually 150/70; heart rate initially recorded at 90, repeat 74;  pulse ox 97% on 2 liters.  [name] [name] is in no acute distress however  she appears to not feel well, almost tearful at times.  She is concerned  she is getting sick again and might need to be hospitalized.  No JVD  noted at 90 degree angles.  LUNGS:  She has  expiratory wheezes throughout with rhonchi.  CARDIOVASCULAR:  Showed an S1 and S2, regular rate and rhythm.  ABDOMEN:  Soft, nontender, positive bowel sounds.  LOWER EXTREMITIES:  The patient has Kerlix dry dressings bilaterally up  to knees.  SKIN:  Warm and dry.  NEUROLOGICALLY:  She is alert and oriented x3.  Up ambulating with the  assistance of her walker, oxygen intact.   IMPRESSION:  Stable diastolic heart failure at this time.  Will continue  Demadex at current dose.  I am more concerned about patient's  respiratory status at this time.  As noted, she just saw Dr. Delford Field on  the 21st of this month.  [name] [name] states she has been taking her  Avelox as far as she knows as the staff at her place of residence gives  her her medications.  However, she is very concerned that she is not  responding to the antibiotic and that she will have to be hospitalized  again.  I called Dr. Lynelle Doctor office to see if she could be seen sooner,  he does not have an appointment available before then, but patient can  be seen by Rubye Oaks, NP tomorrow morning at 9 a.m. for re-  evaluation.  [name] [name] states she would like to do that.  She also has  a followup appointment with Dr. Yetta Barre over on [address] this afternoon  to re-evaluate her lower extremity cellulitis.  I will go ahead and  check laboratory work on patient today including a BMET, a BNP, will  also check a CBC; the results of which will be available for [name]  tomorrow and I will see patient back in 3-4 weeks.   The patient's primary care is Dr. Romero Belling; Pulmonary, Dr. Delford Field;  EP is Dr. Graciela Husbands.  Does not appear that patient has had a pacer check  this year.  Will have her set up an appointment with Dr. Graciela Husbands for  routine pacer check, also with Dr. Antoine Poche her primary Cardiologist for  a routine cardiology visit after she sees EP.      Dorian Pod, ACNP Electronically Signed      Bevelyn Buckles. Bensimhon, MD     MB/MedQ  DD: 01/09/2007  DT: 01/09/2007  Job #:  [id number]   cc:   Rocco Serene, M.D.

## 2011-01-29 NOTE — Procedures (Signed)
[place]. [facility]  Patient:    [name], [name] Visit Number: [id number] MRN: [id number]          Service Type: MED Location: 2000 2021 01 Attending Physician:  Learta Codding Dictated by:   Nathen May, M.D. [facility] Proc. Date: 09/12/01 Admit Date:  09/10/2001   CC:         [place] Office, Attention Device Clinc  EP Lab  [facility]   Procedure Report  PREPROCEDURE DIAGNOSIS: Recurrent syncope with documented intermittent left bundle-branch block and EMS descriptions of heart rate slowing in asystole without documentation.  POSTPROCEDURE DIAGNOSIS: Recurrent syncope with documented intermittent left bundle-branch block and EMS descriptions of heart rate slowing in asystole without documentation.  PROCEDURE: Dual-chamber pacemaker implantation with contrast stenography.  DESCRIPTION OF PROCEDURE: Following the attainment of informed consent, the patient was brought to the cardiac catheterization laboratory and placed on the fluoroscopic table in the supine position. After routine prep and drape of the left upper chest, intravenous contrast was injected via the left antecubital vein to identify the course and the patency of the extrathoracic left subclavian vein.  This having been accomplished, lidocaine was infiltrated in the prepectoral subclavicular region, and an incision was made and carried down to the layer of the prepectoral fascia using electrocautery. A pocket was formed using electrocautery.  Hemostasis was obtained.  Inadvertently, however, the prepectoral fascia was denuted at the superior portion of the pocket (see below). At this point, attention was turned to gain access of the extrathoracic left subclavian vein which was accomplished with a moderate amount of difficulty. There was, however, no aspiration of air or puncture to the artery. Ultimately, a single venipuncture was accomplished and a hemostatic suture was  placed in a figure-of-eight fashion to allowed to hang loosely.  Subsequently, a 9 French tearaway introducer sheath was placed through which was placed a Medtronic 5076 52 cm length active fixation ventricular lead, serial #[id number]. Under fluoroscopic guidance it was manipulated to the mid right ventricular outflow tract where it was secured. The bipolar P wave was 5.6 mV with a pacing threshold of 0.6 volts at 0.5 msec.  Impedence was 782 ohms and a current threshold was 1.1 mA. These numbers were obtained immediately following lead deployments. Thereafter a guide wire was placed through the retained 9 [place] sheath. The sheath was removed and a 7 French sheath was placed through which was passed a Medtronic 5076 45 cm active fixation atrial lead, serial [id number]. Under fluoroscopic guidance it was manipulated to the right atrial appendage with a bipolar P wave of 5.2 mV with a pacing threshold immediately after screw deployment of 1.4 volts at 0.5 msec with impedence of 1084 ohms and a current threshold of 1.5 mA. This lead was then secured to the prepectoral fascia and the leads were then attached to a Medtronic Kappa KDR901 pulse generator, serial #[id number]. This generator was chosen because the EMS report also described "ventricular fibrillation" and not withstanding the likelihood of this device offers high rate electrogram storage.  Hemostasis was then obtained. Because of the pectoral fascia denudation, Avitene was placed at this aspect of the wound to assist with hemostasis. The lead and the pulse generator were the placed and the pocket secured to the prepectoral fascia with the pocket having previously been irrigated copiously with antibiotic containing saline solution. The wound was then closed in three layers in a normal fashion. The wound was washed, dried with benzoin and a Steri-Strip dressing was  then applied.  Needle count, sponge counts and instrument counts  were correct at the end of the procedure according to the staff.  The patients pacemaker was programmed in the DDD mode 60/120 with 80 beat history active. Dictated by:   Nathen May, M.D. [facility] Attending Physician:  Learta Codding DD:  09/12/01 TD:  09/12/01 Job: [id number]

## 2011-01-29 NOTE — Discharge Summary (Signed)
[place]. [facility]  Patient:    LODA, BIALAS Visit Number: [id number] MRN: [id number]          Service Type: MED Location: 3700 3731 01 Attending Physician:  Lenoria Farrier Dictated by:   Brita Romp, P.A. Admit Date:  08/31/2001 Discharge Date: 09/07/2001   CC:         Ernesto Rutherford, M.D.   Discharge Summary  DISCHARGE DIAGNOSES: 1. Coronary artery disease, status post cardiac catheterization with    percutaneous intervention. 2. Diabetes mellitus. 3. Hyperlipidemia. 4. Chronic obstructive pulmonary disease. 5. Hypertension.  HOSPITAL COURSE:  Ms. Sottile is a very pleasant 77 year old female with no prior history of coronary artery disease.  On the day of admission, she developed increasing shortness of breath that led her to call EMS.  By EMS report, she was found to be short of breath and subsequently developed bradycardia, ventricular fibrillation, followed by asystole.  She had loss of consciousness but then spontaneously returned to sinus rhythm and returned her blood pressure without any defibrillation or cardioversion; however, this was not documented on telemetry.  In transport to [facility], she had another similar episode, which again was not documented on telemetry strips. She was seen and admitted by Dr. Charlies Constable.  Given the history, he planned psychocardiac enzymes and planned for a cardiac catheterization the following day.  On December 20, the patient was taken to the catheterization lab per Dr. Bonnee Quin.  Catheterization results:  1. Left main coronary artery:  No significant disease. 2. Left anterior descending:  A 30% in the proximal vessel, 80% lesion between    the two diagonal branches, a 30-40% lesion after the second diagonal. 3. Circumflex:  A 40% proximal in the mid vessel and a subsequent 40% lesion    after the takeoff of the obtuse marginal. 4. Right coronary artery:  A 70-80% lesion in  the proximal vessel. 5. Left ventriculogram:  No significant wall motion abnormalities; ejection    fraction of 51%.  Dr. Riley Kill planned to review the films with Dr. Juanda Chance and decide on intervention versus bypass surgery.  Over the next couple of days, the patient continued to do well and had no further chest pain or shortness of breath.  On December 23, the patient was taken back to the catheterization lab by Dr. Loraine Leriche Pulsipher.  Dr. Gerri Spore then proceeded to intervene on the 80% lesion in the mid portion of the LAD.  After angioplasty and stenting, this was reduced down to 0% residual with a TIMI-3 flow.  He then proceeded to perform IVUS on the proximal right coronary artery.  This showed the stenosis actually to be approximately 33%.  No treatment was indicated.  Dr. Gerri Spore recommended continuing Integrilin for approximately 18 hours and Plavix for four weeks.  On December 25, the patient was seen by Dr. Chales Abrahams.  She denied any further chest pain or shortness of breath.  He discussed the patients case with Dr. Lewayne Bunting.  They agreed that, in light of her normal left ventricular function and lack of documentation on the "vfib," further electrophysiology workup was unlikely to be helpful, especially given the exceedingly rare possibility of spontaneously terminating ventricular fibrillation without amiodarone.  On December 26, the patient was seen by Dr. Dietrich Pates.  The patient was doing well and was ready for discharge.  DISCHARGE MEDICATIONS:  1. Enteric-coated aspirin 325 mg q.d.  2. Potassium 20 mEq b.i.d.  3. Calan SR one and  one-half 240 mg tablet q.d.  4. Hydrochlorothiazide 25 mg q.d.  5. Glyburide 5 mg 2 tablets b.i.d.  6. Combivent as previously taken.  7. Plavix 75 mg q.d. until gone.  8. Altace 2.5 mg q.d.  9. Zocor 40 mg q.h.s. 10. Sublingual nitroglycerin as needed.  LABORATORY VALUES:  Sodium 143, potassium 4.0, chloride 101, CO2 31, BUN  11, creatinine 0.9, glucose 110.  White count 9.5, hemoglobin 11.7, hematocrit 33.0, platelets 291.  AST 28, ALT 35, ALP 47, total bilirubin 0.4, magnesium 1.9.  Troponin-I was slightly elevated with peak value of 0.9.  Chest x-ray on admission showed a stable cardiac enlargement and no acute pulmonary findings.  Electrocardiogram showed normal sinus rhythm with lateral ST wave changes.  Heart rate 73, PR interval 174, QRS 82, QTC 401, axis 63.  DISCHARGE INSTRUCTIONS:  1. The patient is to avoid driving, heavy lifting, or tub baths for 24 hours.  2. She is to follow a low-fat/low-cholesterol diet.  3. She is to watch the catheterization site for any pain, bleeding, or     swelling at the catheterization site and to call the [place] office with     these problems.  4. She is to follow up with Dr. Luciana Axe at [facility] as needed or scheduled.  5. She is to have a ______ visit and a groin check approximately a week     after discharge. Dictated by:   Brita Romp, P.A. Attending Physician:  Lenoria Farrier DD:  09/07/01 TD:  09/08/01 Job: [id number]

## 2011-01-29 NOTE — Discharge Summary (Signed)
[name], [name]                ACCOUNT NO.:  [id number]   MEDICAL RECORD NO.:  [id number]          PATIENT TYPE:  INP   LOCATION:  6711                         FACILITY:  [facility]   PHYSICIAN:  Sandford Craze, NP DATE OF BIRTH:  June 26, 1928   DATE OF ADMISSION:  10/17/2006  DATE OF DISCHARGE:  10/24/2006                               DISCHARGE SUMMARY   DISCHARGE SUMMARY   DISCHARGE DIAGNOSES:  1. Acute COPD exacerbation, home O2 dependent with positive      Pseudomonas sputum culture.  2. Diabetes type 2.  3. Hypertension with episode of hypotension after starting Ace      inhibitor.  4. Chronic diastolic CHF.   HISTORY OF PRESENT ILLNESS:  [name] is a 77 year old white female  who is admitted on October 17, 2006 with a chief complaint of shortness  of breath.  She has a known history of COPD as well as coronary artery  disease.  She was discharged home from the Emergency Department three  days prior to this admission with a COPD exacerbation on outpatient  prednisone as well as antibiotic therapy.  Her symptoms continued and  her non-productive cough worsened.  She presented for further evaluation  and treatment.   PAST MEDICAL HISTORY:  1. Congestive heart failure with diastolic dysfunction.  2. Hypertension.  3. Chronic pulmonary disease with asthmatic bronchitis component.  4. Coronary artery disease status post PCI with stent of the LAD in      December 2002, status post cardiac cath April 2006 with an ejection      fraction of 65% and 40% ostial stenosis of the LAD with 50% in-      stent re-stenosis of LAD.  5. History of obesity.  6. History of presumed asystolic arrest status post permanent      pacemaker placement by Dr. Graciela Husbands in 2002.  7. History of left jugular hypertrophy.  8. Diabetes type 2.  9. Hyperlipidemia.  10.Stasis dermatitis.  11.Cholelithiasis.   COURSE OF HOSPITALIZATION:  1. Acute COPD exacerbation.  The patient was admitted.  She  was placed      on IV Solu-Medrol and continued on oxygen and nebulizers.  She was      also placed on Zithromax.  Sputum culture performed on admission      revealed positive Pseudomonas which was sensitive to Cefepime as      well as Cipro.  The patient was placed on p.o. Cipro as well as      p.o. Ceftin.  During this hospitalization she was successfully      transitioned to p.o. prednisone.  We had initially planned      discharged on October 21, 2006; however, she had increasing      wheezing that day.  As a result, Cipro and Ceftin were added that      day and she was continued on her current dosing of prednisone which      is 60 mg.  Her lungs are currently clear.  Plan for a slow      outpatient taper of p.o. prednisone and we  will continue p.o.      Ceftin and Cipro for a total of 10 days.  2. Diabetes type 2.  The patient's CBGs have been labile during this      admission, likely exacerbated by prednisone.  She was on a slightly      higher dose of Lantus starting this admission of 25 units; however,      we will discharge the patient home on her prior insulin dosing      prior to this admission as she is at risk for hypoglycemia at home      and her CBCs should improve with tapering of prednisone.  3. Chronic diastolic CHF.  The patient has chronic lower extremity      edema which is currently slightly improved from her admission.      Plan to discharge the patient home on outpatient dosing of Demadex.      She is followed by Dr. Antoine Poche at [facility].  4. Hypertension.  The patient was noted to have elevated blood      pressure.  Altace was added at 5 mg.  However, the patient's blood      pressure dropped into the 90s systolic.  As a result, this      medication was discontinued.  Her blood pressure has been labile      but is slightly elevated today with a value of 148/62.  This will      need outpatient followup.   PHYSICAL EXAMINATION:  BP 148/62, heart rate 76,  respiratory rate 18,  temp 98.7, O2 sat 94% on O2 2 liters.  GENERAL:  The is an awake, elderly, white female, morbidly obese, in no  acute distress.  CARDIOVASCULAR:  S1, S2, regular rate and rhythm.  LUNGS are clear to auscultation bilaterally with no wheezes, rales or  rhonchi.  However, breath sounds are diminished throughout.  ABDOMEN is obese.  It is soft.  It is nontender.  It is nondistended.  There are positive bowel sounds.  EXTREMITIES:  Show 3+ bilateral lower extremity edema.  She has  bilateral shin dressings placed which are clean, dry and intact  secondary to some weeping according to the patient.   MEDICATIONS AT TIME OF DISCHARGE:  1. Ceftin 500 mg p.o. b.i.d. through October 30, 2006, then stop.  2. Cipro 500 mg p.o. b.i.d. through October 30, 2006, then stop.  3. Allopurinol 300 mg p.o. daily.  4. Zocor 40 mg p.o. daily in the evening.  5. Ultram 50 mg p.o. b.i.d.  6. Starlix 60 mg p.o. t.i.d. before meals.  7. Cardizem LA 180 mg p.o. daily.  8. Aspirin 81 mg p.o. daily.  9. Spiriva 18 mcg inhalation once daily.  10.Flovent 220 mcg 2 puffs twice daily.  11.Lantus insulin 15 units once daily as before.  12.NovoLog 10 units before breakfast, 15 units before lunch and 7      units before dinner.  13.Protonix 40 mg once daily.  14.Demadex 60 mg in the morning and 40 mg p.o. in the evening.  15.DuoNeb's 4 x times daily via nebulizer.  16.Potassium chloride 20 mEq p.o. b.i.d.  17.Prednisone 50 mg p.o. for 3 days, then 40 mg p.o. for 3 days, then      30 mg p.o. for 3 days, then 20 mg p.o. for 3 days, then 10 mg p.o.      for 3 days, then stop.  18.Fluticasone 0.005% ointment once daily to legs.  19.NitroQuick 0.4 mg  under tongue as needed.   DISPOSITION:  The patient will be discharged to the Assisted Living  Facility.   FOLLOW UP:  The patient is instructed to follow up with Dr. Romero Belling in 1 week and sooner if symptoms persist or worsen.   PERTINENT  LABORATORIES AT TIME OF DISCHARGE:  Hemoglobin A1c 629.  Blood  cultures negative x 2.  To date BUN 35, creatinine 1.63.      Sandford Craze, NP     MO/MEDQ  D:  10/24/2006  T:  10/24/2006  Job:  [id number]   cc:   Gregary Signs A. Everardo All, MD

## 2011-01-29 NOTE — Cardiovascular Report (Signed)
[place]. [facility]  Patient:    [name], [name] Visit Number: [id number] MRN: [id number]          Service Type: MED Location: [id number] Attending Physician:  Lenoria Farrier Dictated by:   Daisey Must, M.D. [facility] Proc. Date: 09/04/01 Admit Date:  08/31/2001   CC:         [facility]  Bruce R. Juanda Chance, M.D. [facility]  Cardiac Catheterization Lab   Cardiac Catheterization  PROCEDURE: 1. Percutaneous transluminal coronary angioplasty with stent placement in    the mid left anterior descending artery. 2. Intravascular ultrasound of the proximal and ostial right coronary artery.  CARDIOLOGIST:  Daisey Must, M.D. [facility]  INDICATION:  [name] is a 77 year old woman without was admitted to the hospital late last week after an apparent brief cardiac arrest at home.  By report, she developed shortness of breath, and EMS was summoned.  When they arrived, she apparently had sinus bradycardia, then ventricular fibrillation, followed by asystole with loss of consciousness but then return to sinus rhythm with return of blood pressure without any defibrillation required. Unfortunately, this was not documented on rhythm strips.  She apparently had another episode of ventricular fibrillation en route which again spontaneously resolved.  Subsequent cardiac enzymes showed a mildly elevated troponin to a peak of 0.09 with normal CK and MB.  Cardiac catheterization performed on December 20 by Dr. Riley Kill revealed an 80% stenosis in the mid LAD with moderate diffuse disease in the distal LAD.  There was also disease in the ostium of the right coronary artery of unclear severity on the diagnostic catheterization that appeared to be 70 to 80%; however, some of this may have been related to spasm.  After discussion and review of the images, we opted to proceed with percutaneous intervention of the LAD and further assessment of the right coronary  artery.  PROCEDURE NOTE:  A 7-French sheath was placed in the right femoral artery. Heparin and Integrilin were administered per protocol.  We initially treated the LAD.  We used a 7-French JL4 guiding catheter and BMW wire.  The lesion in the mid LAD was predilated with a 2.5 x 13 mm PowerSail balloon inflated to 10 atmospheres.  We then deployed a 2.5 x 18 mm Pixel stent at a deployment pressure of 8 atmospheres.  We post dilated this stent with a 2.5 x 13 mm Powersail balloon inflated to 13 atmospheres in the distal aspect of the stent and 20 atmospheres in the proximal aspect of the stent.  Final angiographic images revealed patency of the mid LAD with 0% residual stenosis and TIMI-3 flow.  There was moderate disease beyond the stent; however, this remained stable.  We then turned our attention to the right coronary artery.  We used a 7-French JR4 guiding catheter with side holes and a BMW wire.  Initial angiographic images revealed disease in the ostium of the right coronary artery which appeared to be possibly 50% narrowed by angiography.  We then advanced a BMW wire distally into the right coronary artery.  Intracoronary nitroglycerin was administered.  Following this, an Atlantis intravascular ultrasound catheter was advanced into the proximal vessel.  Intravascular ultrasound of the proximal and ostial right coronary artery was performed with images being recorded on video tape.  Automated pullback was utilized.  The intravascular ultrasound images were then reviewed.  These revealed a reference lumen diameter of 3.0 x 3.1 mm.  The minimal lumen diameter at the ostium of the  right coronary artery is 2.0 x 2.3 mm.  This represents less than or equal to 33% diameter stenosis which is not considered to be hemodynamically significant.  The guidewire was then removed.  Final angiographic images revealed patency of the right coronary artery with no evidence of  vessel trauma.  COMPLICATIONS:  None.  RESULTS: 1. Successful PTCA with stent placement in the mid left anterior descending    artery reducing an 80% stenosis to 0% residual with TIMI-3 flow. 2. Intravascular ultrasound of the proximal and ostial right coronary artery    revealing a 33% diameter stenosis in the ostium of the right coronary    artery.  PLAN:  Integrilin will be continued for 18 hours.  Plavix will be administered for a minimum of four weeks. Dictated by:   Daisey Must, M.D. [facility] Attending Physician:  Lenoria Farrier DD:  09/04/01 TD:  09/05/01 Job: [id number]

## 2011-01-29 NOTE — Assessment & Plan Note (Signed)
[facility]                          CHRONIC HEART FAILURE NOTE   NAME:[name], [name]                       MRN:          [id number]  DATE:08/22/2006                            DOB:          09/18/1927    PRIMARY CARE PHYSICIAN:  Sean A. Everardo All, MD   PULMONOLOGIST:  Charlcie Cradle. Delford Field, MD, FCCP   PRIMARY CARDIOLOGIST:  I do not know at this time, as I do not have  access to her chart.   ELECTROPHYSIOLOGIST:  Duke Salvia, MD, [facility]   [name] returns today after a three month lapse in heart failure  management.  She states that she has recently been discharged from the  hospital.  She states that she was in [place] for three weeks,  apparently with exacerbation of COPD with question of bronchial  pneumonia.  She was admitted under the care of [facility].  Apparently was treated with IV steroids and IV antibiotics  with bronchial dilator therapy during that time.  Discharged home with  instructions to take her Demadex 80 mg daily.  The patient states the  [facility], where she resides, has been giving her  20 mg since she returned back to her place of residence there.  I do not  know who decreased the dose to 20 mg.  The order occurred on December  4th, according to the [facility] from [facility].  [name] states she  has also taken her metolazone 2.5 mg, as previously ordered; however,  she states that she has been very uncomfortable.  She has complained of  abdominal fullness, orthopnea, increased shortness of breath since her  Demadex dose was decreased.  She complains of increased peripheral edema  with some weeping from her lower extremities.   PAST MEDICAL HISTORY:  1. Congestive heart failure secondary to diastolic dysfunction with an      ejection fraction of 60% by echocardiogram in 2003.  Also showed      trivial mitral regurgitation, mild pulmonary hypertension with a      pulmonary  pressure at 30-40 mmHg.  2. Coronary artery disease, status post PTCA and stenting of the LAD      in December, 2002.  The most recent cardiac catheterization in      April, 2006 showed an EF of 65%.  3. Hypertension.  4. COPD with asthmatic bronchitis.  Recent hospitalization.  5. Diabetes.  6. Hyperlipidemia.  7. Stasis dermatitis.  8. Cholelithiasis.  9. Chronic renal insufficiency.  10.Steroid dependence (secondary to COPD).  11.Presumed asystolic arrest, status post permanent pacemaker      placement in 2002 by Dr. Graciela Husbands.   REVIEW OF SYSTEMS:  As stated above.   CURRENT MEDICATIONS:  Per documentation from [facility].  Patient is on Humalog insulin, doxycycline 100 mg b.i.d. for 14  days, Zaroxylan 2.5 mg daily, O2 2 liters nasal cannula, DuoDerm to  ulceration, left leg, Cutivate cream to ulceration on leg.  Demodex 20  mg daily.  K-Dur 20 mEq daily.  Allopurinol 300 mg daily.  Aspirin 81.  Spiriva inhaler.  Prednisone 10 mg.  Altace 5 mg.  Duonebs.  Zocor 40.  Cardizem LA 180.   ALLERGIES:  PENICILLIN, CODEINE.   PHYSICAL EXAMINATION:  VITAL SIGNS:  Weight today is 189 pounds.  I do  not know what her discharging weight from the hospital was, and I do not  have access to her chart today.  Blood pressure 139/77 with a pulse of  97.  GENERAL:  [name] is in no acute distress.  NECK:  Supple.  She has no jugular venous distention at a 90 degree  angle.  CHEST:  Clear to auscultation bilaterally.  Poor inspiratory effort.  CARDIOVASCULAR:  A regular rate and rhythm.  S1 and S2.  ABDOMEN:  Soft, nontender.  Positive bowel sounds.  She is distended.  EXTREMITIES:  Lower extremities, she has 2+ pitting, weeping edema,  bilateral lower extremities.   IMPRESSION:  [name] has a history of diastolic dysfunction with an  ejection fraction of 60%; however, she is in volume overload at this  time secondary to recent decrease in her diuretic therapy.   I am going  to have her increase her Demadex to 40 mg p.o. b.i.d., increase her  Zaroxylan to 5 mg for two days, then return to her 2.5 mg daily standing  dose.  [name] does not tolerate reduction in her Demadex.  We have  tried in the past to minimize the use of diuretics in her; however, she  is prone to volume overload quickly.  I am going to see her back next  week; hopefully, she will have some improvement in volume overload at  that time.      Dorian Pod, ACNP  Electronically Signed      Bevelyn Buckles. Bensimhon, MD  Electronically Signed   MB/MedQ  DD: 08/22/2006  DT: 08/22/2006  Job #: [id number]   cc:   [facility]

## 2011-01-29 NOTE — H&P (Signed)
[name], [name]                ACCOUNT NO.:  [id number]   MEDICAL RECORD NO.:  [id number]          PATIENT TYPE:  EMS   LOCATION:  ED                           FACILITY:  [facility]   PHYSICIAN:  Leonard Downing. Pernell Dupre, M.D.  DATE OF BIRTH:  Jul 29, 1928   DATE OF ADMISSION:  04/14/2006  DATE OF DISCHARGE:                                HISTORY & PHYSICAL   CHIEF COMPLAINT:  Shortness of breath.   HISTORY AND PHYSICAL:  This is a 77 year old Caucasian female, with a  history of COPD and asthmatic bronchitis, who presents with shortness of  breath.  The patient recently was seen approximately four weeks ago for  pneumonia and was given antibiotics.  She states she felt better but  redeveloped this shortness of breath apparently four days ago.  The patient  states she was coughing up green, yellow phlegm, sore throat.  No fevers,  nausea, chills or night sweats.  The patient denies problems urinating,  lumps or bumps, problems with bowel movement.  No chest pain, PND,  orthopnea, or worsening lower extremity edema.   ALLERGIES:  1.  PENICILLIN.  2.  CODEINE.   MEDICATIONS:  1.  Flovent 1 puff twice a day.  2.  Torsemide 20 twice a day.  3.  Altace 5 twice a day.  4.  Zocor 40 mg a day.  5.  Darvocet and nitroglycerin p.r.n.  6.  Cardizem 180 mg daily.  7.  Allopurinol 300 mg daily.  8.  K-Dur 10 mEq daily.  9.  Aspirin 81 daily.  10. Spiriva 1 daily.  11. Albuterol and Atrovent q.i.d.  12. She also states that she is on two liters of oxygen at home.   PAST MEDICAL HISTORY:  1.  COPD.  2.  Asthmatic bronchitis.  3.  Type 2 diabetes mellitus.  4.  Hyperlipidemia.  5.  Questionable history of atrial fibrillation.  6.  Questionable history of heart failure.  7.  Hypertension.  8.  Osteoarthritis.  9.  Gout.   SOCIAL HISTORY:  Quit tobacco in 1987.  No alcohol.  Widowed, retired, lives  in [place].   FAMILY HISTORY:  Noncontributory.   REVIEW OF SYSTEMS:  Positive for  chills, nasal discharge, constipation.  Otherwise unremarkable.   PHYSICAL EXAMINATION:  VITAL SIGNS:  Blood pressure 138/64, pulse of 101,  saturation 98% on two liters O2.  GENERAL:  Alert and oriented x3 on oxygen with a runny nose.  HEENT:  No increased JVP.  No carotid bruits or thyromegaly.  CARDIOVASCULAR:  Tachycardic.  Normal S1 and S2 with systolic ejection  murmur left lower sternal border.  LUNGS:  Diffuse expiratory wheezes.  ABDOMEN:  Obese, soft, nontender, and nondistended.  Positive bowel sounds.  EXTREMITIES:  No clubbing or cyanosis.  Bilateral lower extremity edema, 1+  with questionable bite on her left shin with erythema.  NEUROLOGICAL:  Alert and oriented x3.  Cranial nerves II through XII are  grossly intact.   LABORATORY DATA:  Chest x-ray questionable right middle lobe nodule,  questionable right perihilar infiltrate.  Labs show white  count 9.9, H&H  10.4 and 32.1, platelets 208,000.  Sodium 141, potassium 4.2, BUN 43,  creatinine 2, glucose 104.  LFTs unremarkable.  Calcium 9.1.   ASSESSMENT:  This is a 77 year old female with a history of chronic  obstructive pulmonary disease and asthmatic bronchitis who presents with  what seems to be an inadequately treated pneumonia.   PLAN:  1.  Pneumonia:  Blood cultures x2.  Arterial blood gas on room air.      Levaquin 500 a day, prednisone 40 mg a day with rapid taper.  Albuterol      and Atrovent nebulizers p.r.n. sputum culture.  Hydrate.  Hold the      torsemide.  2.  Swelling bilateral lower extremities:  Check a B-type natriuretic      peptide.  Check an echocardiogram.  3.  Diabetes mellitus:  Check a hemoglobin A1c.  Place on sliding scale      insulin.  4.  Hyperlipidemia:  Check lipid panel.  Continue Zocor 40 mg a day.  5.  Hypertension:  Continue Altace and Cardizem.  6.  Renal insufficiency:  Most likely secondary to dehydration.  We will      hydrate and hold torsemide.            ______________________________  Leonard Downing Pernell Dupre, M.D.     Loralie Champagne  D:  04/14/2006  T:  04/14/2006  Job:  [id number]

## 2011-01-29 NOTE — H&P (Signed)
NAMEVON, INSCOE                ACCOUNT NO.:  [id number]   MEDICAL RECORD NO.:  [id number]          PATIENT TYPE:  INP   LOCATION:  6709                         FACILITY:  [facility]   PHYSICIAN:  Barbaraann Share, MD,FCCPDATE OF BIRTH:  05[birth year]10[birth year]1930   DATE OF ADMISSION:  09/05/2006  DATE OF DISCHARGE:                              HISTORY & PHYSICAL   HISTORY OF PRESENT ILLNESS:  The patient is a 77 year old white female  with a history of COPD and diastolic heart failure who presents with a 1-  week history of congestion and increasing shortness of breath.  The  patient had been seen by Dr. Delford Field last week and was treated with an  antibiotic of unknown type, but by her description appears to be a  fluoroquinolone.  The patient has continued to have congestion and  purulence but no fevers, chills or sweats.  In the emergency room, her  chest x-ray was without acute infiltrate, but she did have hyperglycemia  along with acute-on-chronic renal insufficiency.  The patient also had  swelling in the left lower extremity, consistent with an early  cellulitis.  Her O2 saturations were adequate.  Because of her various  medical issues and worsening shortness of breath, she will be admitted  for further treatment.   PAST MEDICAL HISTORY:  Quite extensive and significant for:  1. Diastolic heart failure.  2. History of COPD with chronic respiratory failure.  3. History of coronary disease with prior stents.  4. History of diabetes.  5. History of dyslipidemia.  6. History of stasis dermatitis and venous insufficiency.  7. History of permanent pacemaker in 2002.   CURRENT MEDICATIONS:  As supplied by [facility] include:  1. Starlix 60 mg one in the morning, two at lunch and one at dinner.  2. Demadex 80 mg daily.  3. Altace 5 mg daily.  4. Topicort ointment to left leg p.r.n.  5. Prednisone 10 mg daily.  6. Nitroglycerin p.r.n.  7. Cardizem LA 180 mg p.o. daily.  8.  Allopurinol 300 mg daily.  9. Potassium 20 mEq p.o. daily.  10.Aspirin 81 mg p.o. daily.  11.Spiriva one inhalation daily.  12.Zocor 40 mg daily.  13.Ultram p.r.n.  14.Oxygen at two liters 24 hours a day.  15.Avelox 400 mg that she is finishing up from a recent visit with Dr.      Delford Field.  16.Lantus 10 units subcutaneous q.h.s.  17.Humalog in a sliding scale fashion for her diabetes.   ALLERGIES:  1. PENICILLIN.  2. CODEINE.   SOCIAL HISTORY:  She lives in assisted living.  She has not smoked since  1989.   FAMILY HISTORY:  Significant for coronary disease.   REVIEW OF SYSTEMS:  As per history of present illness.  Also see patient  intake form in the chart.   PHYSICAL EXAMINATION:  GENERAL:  She is an obese female in no acute  distress at this time.  VITAL SIGNS:  Blood pressure is 153/68, pulse 106, respiratory rate 20,  temperature is 98.7.  O2 saturation on 2 liters is 97%.  HEENT: Pupils equal,  round and reactive to light and accommodation.  Extraocular movements intact.  Nares are patent without discharge.  Oropharynx is clear.  NECK:  Supple without JVD or lymphadenopathy.  There is no palpable  thyromegaly.  CHEST:  Reveals some rhonchi throughout with some upper airway  pseudosevere wheezing.  There is absolutely no true wheezing and  adequate air flow.  There are no crackles.  CARDIAC:  A mildly tachycardic, but what sounds like a regular rhythm.  ABDOMEN:  Soft, nontender with good bowel sounds.  GENITAL/RECTAL/BREAST EXAM:  Not done and not indicated.  EXTREMITIES:  The lower extremities show 1+ edema, left worse than  right, with warmth and erythema in the area of a bandaged sore on her  left leg.  Pulses are intact distally but decreased.  NEUROLOGIC:  She is alert and oriented.  Moves all four extremities.   LABORATORY DATA:  CHEST X-RAY:  Hyperinflation and is free of any acute  infiltrate.  She had a white blood cell count of 13,300 with a  hemoglobin of  12, hematocrit of 37, platelet count of 185,000.  She had  a sodium of 133, potassium 5.1, chloride 96, bicarbonate 25, glucose  473, BUN 66, creatinine 2.6.  UA was unremarkable.   IMPRESSION:  1. History of chronic respiratory failure secondary to chronic      obstructive pulmonary disease and now with persistent acute      bronchitis.  The patient has no evidence of significant      bronchospasm or pathologic hyperinflation by exam and history.  She      is afebrile and has no infiltrate on chest x-ray.  It is unclear      whether she still has an acute bronchitis with a resistant pathogen      to normal p.o. antibiotics.  I will go ahead and place her on IV      antibiotics to cover more resistant pathogens.  Again, the patient      currently is in absolutely no respiratory distress.  She is having      no chest pain or chest pressure.  There is no history to suggest      pulmonary embolus, and she is satting quite well on 2 liters of      nasal oxygen.  2. Hyperglycemia which is going to require more aggressive treatment.  3. Early left lower extremity cellulitis.  4. Acute renal insufficiency, probably secondary to over diuresis.   PLAN:  1. Admit to the regular floor.  2. IV antibiotics, as well as gentle fluid administration.  3. Insulin and follow up CPKs.  4. Will consider lower extremity venous Dopplers if her lower      extremity continues to be an issue.  5. At this point in time hold ACE inhibitor, diuretics, as well as      potassium until the renal function improves.      Barbaraann Share, MD,FCCP  Electronically Signed     KMC/MEDQ  D:  09/05/2006  T:  09/06/2006  Job:  [id number]   cc:   Charlcie Cradle. Delford Field, MD, FCCP

## 2011-01-29 NOTE — Discharge Summary (Signed)
[name], [name]                ACCOUNT NO.:  [id number]   MEDICAL RECORD NO.:  [id number]          PATIENT TYPE:  INP   LOCATION:  6709                         FACILITY:  [facility]   PHYSICIAN:  Charlcie Cradle. Delford Field, MD, FCCPDATE OF BIRTH:  1928-04-04   DATE OF ADMISSION:  09/05/2006  DATE OF DISCHARGE:  09/09/2006                               DISCHARGE SUMMARY   DISCHARGE DIAGNOSES AS FOLLOWS:  1. Chronic obstructive pulmonary disease with acute bronchitis flare.  2. Acute on chronic renal insufficiency.  3. Diastolic heart failure.  4. Diabetes type 2.  5. Left lower extremity cellulitis.   LABORATORY DATA:  September 09, 2006:  Sodium 139, potassium 4.5,  chloride 111, CO2 24, glucose 106, BUN 26, creatinine 2.5, calcium 9.  September 08, 2006:  White blood cells 12.3,  hemoglobin 10, hematocrit  29.8, platelets 140,000.  Random blood glucose on September 07, 2006,  2004.  September 07, 2006:  Hemoglobin A1c 9.3.  September 05, 2006:  Urinary culture 4000 colony count of Proteus which was pan sensitive.  September 05, 2006:  Blood cultures x2 were pending, but no growth to  date.   BRIEF HISTORY:  A 77 year old female with history of chronic obstructive  pulmonary disease and diastolic heart failure who presented to the  emergency room with a 1 week history of congestion and increasing  shortness of breath.  The patient had been seen by Dr. Delford Field the week  prior to admission and treated with an antibiotic of unknown type, but  by her description appeared to be a fluoroquinolone.  The patient had  continued to have congestion and purulent sputum, but no fevers, chills  or sweats.  In the emergency room her chest x-ray was without acute  infiltrate, but she did have hyperglycemia along with acute on chronic  renal insufficiency.  The patient also had swelling in the left lower  extremity consistent with an early cellulitis.  Her O2 saturations were  adequate, because of her various  medical issues and worsening shortness  of breath she was admitted for further evaluation and therapy.   PAST MEDICAL HISTORY:  Consists of:  1. Diastolic heart failure.  2. Chronic obstructive pulmonary disease with chronic respiratory      failure.  3. Coronary artery disease with prior stents.  4. Diabetes type 2.  5. Dyslipidemia.  6. History of stasis dermatitis with venous insufficiency.  7. Permanent pacemaker in 2002.   ALLERGIES:  1. PENICILLIN.  2. CODEINE.   HOSPITAL COURSE BY DISCHARGE DIAGNOSIS:  1. Acute on chronic respiratory failure secondary to acute bronchitis      and exacerbation of chronic obstructive pulmonary disease.  Ms.      Benham was seen initially in the emergency room by Dr. Marcelyn Bruins.  Therapy consisted of aggressive nebulized bronchodilators,      empiric antibiotics consisting initially of Primaxin and      azithromycin, and oral prednisone therapy.  Her respiratory status      continued to improve over the course of her hospitalization.  Her  prednisone was tapered down to 10 mg daily on September 07, 2006,      she was also initiated on inhaled corticosteroids twice daily.  On      September 08, 2006, her IV antibiotics were discontinued, and Septra      DS was initiated.  Currently at time of discharge she is rapidly      returning to baseline pulmonary status.  She will be discharged to      the skilled nursing facility on Spiriva daily, albuterol nebulized      4 times a day, daily Protonix, and daily oral prednisone.  Follow      up from pulmonary standpoint will be January 11th with Dr. Delford Field.  2. Acute on chronic renal insufficiency.  Ms. Kuehnel' initial      creatinine on presentation was 2.6 with a BUN of 66.  She was      initially given gentle hydration, with her diuretics being held,      she has since been resumed on her diuretic therapy.  Her creatinine      upon time of discharge is 1.5.  She does have follow up with  Dr.      Romero Belling on September 16, 2006, at which time she will also need a      follow up BMP to re-evaluate her blood chemistries, specifically      her BUN and creatinine.  3. Diabetes type 2 with poorly controlled hyperglycemia and elevated      hemoglobin A1c.  This was managed in the inpatient sitting with      sliding scale insulin.  She will be sent home on oral hypoglycemic      agents, as well as subcutaneous insulin injection prior to meals      and basal insulin consisting of Lantus injection.  Again, will ask      Dr. Everardo All to follow up on this in the outpatient setting already      arranged for next week.  4. Diastolic heart failure, followed in the outpatient setting by Dr.      Arvilla Meres.  An echocardiogram was obtained during this      hospitalization.  However, at this time current formal results are      not available in the computer system, she has been followed in the      outpatient setting in the past by Dr. Arvilla Meres with an      intact EF of 60%, and findings consistent with diastolic heart      failure.  She will be discharged to home with daily Demadex,      however, her Edd Fabian will be discontinued.  It is recommended      specifically that she has daily weights recorded, as well as      capillary blood glucose checked 4 times a day.  5. Left lower extremity cellulitis, improved with empiric IV therapy.      She will be sent home with Septra DS for which she will take for 5      more days and then discontinue.  6. Question of Proteus urinary tract infection.  She has been treated      empirically with Primaxin from December 24-27, 2007, there would be      some question as to whether or not this actually represents a true      urinary tract infection given colony count of 40,000.  At any rate,  she will be sent home on Septra DS, which does exhibit     sensitivities to the Proteus.  She will complete 5 more days.   DISPOSITION UPON  TIME OF DISCHARGE:  Afebrile.  Respirations 20-24,  heart rate 75, blood pressure 155/85.  Oxygen saturations 98% on 2L.  HEENT:  No JVD or adenopathy.  CHEST:  Clear to auscultation.  CARDIAC:  Regular rate and rhythm.  EXTREMITIES:  Without edema.  NEUROLOGIC:  Grossly intact, with no distress.   LABORATORY DATA:  As mentioned above.   DIET:  Low concentrated sweet.   DISCHARGE INSTRUCTIONS:  1. She is to have capillary blood glucose checked 4 times a day.  2. She is to have a daily weight recorded for Dr. George Hugh review      daily.   Her medication regimen consists of the following:  1. Spiriva 18 mcg cap, one cap inhaled daily.  2. Albuterol 2.5 mg HHN inhaled 4 times a day.  3. Cardizem 180 mg daily.  4. Allopurinol 300 mg daily.  5. Aspirin 81 mg daily.  6. Zocor 40 mg p.o. daily.  7. Protonix 40 mg p.o. daily.  8. NovoLog insulin 10 units subcutaneously at breakfast, 15 units      subcutaneously at lunch, 5 units subcutaneously at dinner.  9. Lantus insulin 20 units subcutaneously at hour of sleep.  10.Starlix 60 mg p.o. t.i.d. before meals.  11.Septra DS one p.o. b.i.d. x5 more days then discontinue.  12.Potassium chloride 20 mEq tablet daily.  13.Demadex 40 mg p.o. daily.  14.Prednisone 10 mg p.o. daily.  15.Flovent 220 mcg inhale 2 puffs b.i.d.  16.Nitroglycerin p.r.n.  17.Oxygen at 2L nasal cannula x24 hours a day.   FOLLOWUP APPOINTMENTS:  1. Dr. Romero Belling September 16, 2006, at 10:45 a.m.  2. Dr. Shan Levans September 23, 2006, at 3:50 p.m.   Again, the patient will need assistance in transportation to these  appointments.      Zenia Resides, NP      Charlcie Cradle. Delford Field, MD, [facility]  Electronically Signed    PB/MEDQ  D:  09/09/2006  T:  09/09/2006  Job:  [id number]   cc:   Gregary Signs A. Everardo All, MD  Charlcie Cradle Delford Field, MD, [facility]  Bevelyn Buckles. Bensimhon, MD

## 2011-01-29 NOTE — Assessment & Plan Note (Signed)
[facility]                               PULMONARY OFFICE NOTE   NAME:[name], [name]                       MRN:          [id number]  DATE:04/06/2006                            DOB:          February 23, 1928    Ms. Naeve is a 77 year old white female with a history of chronic  obstructive lung disease, asthmatic bronchitis, noting increased cough,  increased chest congestion.  The cough is productive of thick yellow mucus.  Increasing dyspnea is noted.  The symptoms are getting progressively worse.  She had been taken off Spiriva recently.  She is maintained on albuterol  nebulization four times daily, oxygen 2 liters continuous, Flovent 2 sprays  b.i.d. 220 mcg strength.   PHYSICAL EXAMINATION:  GENERAL:  This is an elderly white female in no  distress.  VITAL SIGNS:  Temp 98, blood pressure 110/68, pulse 89, saturation 98% on  room air.  CHEST:  Coarse rhonchi with poor air flow.  CARDIAC:  Regular rate and rhythm without S3.  Normal S1 and S2.  ABDOMEN:  Soft, nontender.  EXTREMITIES:  No edema or clubbing.  SKIN:  Clear.  NEUROLOGIC:  Intact.  HEENT:  No jugular venous distention.  No lymphadenopathy.  Oropharynx  clear.  Neck supple.   LABORATORY DATA:  A chest x-ray was obtained and showed no acute  infiltrates, COPD changes.   IMPRESSION:  Acute bronchitis with chronic obstructive pulmonary disease  exacerbation.   PLAN:  Levaquin 500 mg x7 days.  Pulse prednisone 40 mg tapered down by 10  mg every 3 days until off.  Re-add Spiriva 2 sprays one capsule daily.  Maintain Flovent and albuterol nebs as is.  Return in one month.                                   Charlcie Cradle Delford Field, MD, FCCP   PEW/MedQ  DD:  04/06/2006  DT:  04/06/2006  Job #:  [id number]   cc:   Gregary Signs A. Everardo All, MD

## 2011-01-29 NOTE — Assessment & Plan Note (Signed)
[facility]                           ELECTROPHYSIOLOGY OFFICE NOTE   NAME:SIMMONSAdrea, Sherpa                       MRN:          [id number]  DATE:06/21/2006                            DOB:          1928-03-30    Ms. Bryars is seen.  She is status post pacemaker implantation for syncope  and sinus node dysfunction.  She is doing quite well from that point of  view.   The litany regarding her intercurrent illnesses is lengthy.  She continues  to feel not well; and apparently she had, in the setting of her pneumonia,  has had some problems with permanent lung damage at least that is how she  describes it from Dr. Delford Field.  In any case her medications were reviewed.   PHYSICAL EXAMINATION:  On examination her blood pressure was 129/66.  Her  pulse was recorded as 100 and then subsequently about 80.  Lungs were clear.  Heart sounds were regular.  The extremities were without edema.   Interrogation of her pacemaker demonstrates that her heart rate has shifted  to the right; and her P wave was 2 with impedance of 480, threshold 0.5 to  0.4.  The R wave was 8 with impedance of 485, threshold of __________  is  2.76.   IMPRESSION:  1. Syncope with sinus node dysfunction.  2. Status post pacer as above.  3. Intermittent complete heart block.  4. Diastolic heart failure.  5. Recurrent pneumonias.   Ms. Bassinger is stable from an arrhythmia point of view.  I think that she  has followup scheduled with the heart failure clinic and she is scheduled to  see Dr. Everardo All in the near future.   She will be followed remotely for her device and we will see her in clinic  in 1 year's time.            ______________________________  Duke Salvia, MD, [facility]     SCK/MedQ  DD:  06/21/2006  DT:  06/23/2006  Job #:  [id number]   cc:   Gregary Signs A. Everardo All, MD

## 2011-01-29 NOTE — Assessment & Plan Note (Signed)
[facility]                             PULMONARY OFFICE NOTE   NAME:SIMMONSCaridad, [name]                       MRN:          [id number]  DATE:09/21/2006                            DOB:          01-Dec-1927    This patient continues to have difficulty and fatigue with shortness of  breath.  She was hospitalized prior to the new year for acute  bronchitis, COPD exacerbation flare, and acute on chronic renal  insufficiency, diastolic heart failure, type 2 diabetes, and left lower  extremity cellulitis.  She was in the hospital between the 24th and 28th  of December.  During this hospitalization we gave her IV antibiotics and  eventually the patient was readied for discharge she was given mild IV  fluid hydration.  Her creatinine which on admission was 2.5 and BUN 26  did come down to 1.5.  She was sent home on Spiriva 1 capsule daily;  albuterol by nebulization 4 times daily; Cardizem 180 mg daily;  allopurinol 300 mg daily; aspirin 81 mg daily; Zocor 40 mg daily;  Protonix 40 mg daily; Novolin 10 units before breakfast, 15 units at  lunch, 5 units at dinner; Lantus 20 units at sleep; Starlix 60 mg t.i.d.  before meals; Septra DS for 5 days, twice daily; potassium was at 20 mEq  daily; Demadex 40 mg daily; prednisone 10 mg daily; Flovent 2/20 mcg  strength 2 sprays b.i.d.; oxygen 2 liters continuous.  She saw Dr.  Everardo All on the 4th.  Lab data at this time showed hemoglobin of 10.7,  white count 8.9, sodium 141, potassium 4.2, chloride 106, CO2 of 29, BUN  24, creatinine 2, blood sugar was 90, hemoglobin A1c was 9.3.   Chest x-ray obtained today did not show acute or active infiltrates.   PHYSICAL EXAMINATION:  Temperature 98, blood pressure 114/70, pulse 101,  saturation was 96% on 2 liters.  CHEST:  Showed diminished breath sounds with prolonged expiratory phase.  There was no evidence of wheeze, rale, or rhonchi noted.  CARDIAC:  Showed a regular rate  and rhythm without S3, normal S1, S2.  ABDOMEN:  Soft, nontender.  EXTREMITIES:  Showed no edema, clubbing, or venous disease.  SKIN:  Clear.  NEUROLOGIC:  Intact.  HEENT:  Showed no jugular venous distention, no lymphadenopathy,  oropharynx clear.  NECK:  Supple.   IMPRESSION:  Impression is that of no evidence of active congestive  failure, stable chronic obstructive pulmonary disease, chronic fatigue,  chronic lower extremity pedal edema but euvolemia at this time.   PLAN:  To maintain Demadex, potassium as currently dosed.  Will restart  DuoNeb on a q.i.d. basis as she needs to maintain this on a regular  basis.  Maintain Altace at 5 mg daily,  prednisone 10 mg daily, Spiriva daily, oxygen as is.  Return the patient  to this office in one month.     [name] Cradle Delford Field, MD, [facility]  Electronically Signed    PEW/MedQ  DD: 09/21/2006  DT: 09/21/2006  Job #: [id number]   cc:   Gregary Signs A. Everardo All, MD  Bevelyn Buckles. Bensimhon, MD

## 2011-01-29 NOTE — H&P (Signed)
[name], [name]                ACCOUNT NO.:  [id number]   MEDICAL RECORD NO.:  [id number]          PATIENT TYPE:  EMS   LOCATION:  ED                           FACILITY:  [facility]   PHYSICIAN:  Sean A. Everardo All, M.D. [facility] OF BIRTH:  05-06-1928   DATE OF ADMISSION:  02/28/2006  DATE OF DISCHARGE:                                HISTORY & PHYSICAL   REASON FOR ADMISSION:  Shortness of breath.   HISTORY OF PRESENT ILLNESS:  A 77 year old woman with three days of a  productive quality cough.  She has associated slight pain at the throat.  She also has some fever, some nausea, and some diarrhea.   PAST MEDICAL HISTORY:  1.  Type 2 diabetes.  2.  Dyslipidemia.  3.  Questionable history of atrial fibrillation.  4.  Questionable history of CHF.  5.  COPD.  6.  Hypertension.  7.  Osteoarthritis.  8.  Gout.   MEDICATIONS:  1.  Altace 5 mg twice daily.  2.  Demadex 40 mg twice daily.  3.  Vytorin 10/20 one q.h.s.  4.  Ultram 50 mg twice daily as needed for pain.  5.  Phenergan 12.5 mg every 6 hours as needed for nausea.  6.  Tylenol as needed for pain.  7.  Darvocet as needed for pain.  8.  Sublingual nitroglycerin as needed for chest pain.  9.  Starlix at an uncertain dosage.  10. Cardizem-CD 180 mg daily.  11. Aspirin 81 mg daily.  12. Potassium chloride 10 mEq daily.  13. Allopurinol 300 mg daily.  14. Flovent at an uncertain dosage.   SOCIAL HISTORY:  She is widowed, she is retired, she lives at [facility].   FAMILY HISTORY:  Several others close to the patient are ill, also.   REVIEW OF SYSTEMS:  Denies the following:  Loss of consciousness, headache,  chest pain, abdominal pain, rectal bleeding, hematuria, seizure, dysuria,  visual loss, ear pain.  She states there is no change in the chronic slight  rash at her left leg.  She also has a lost a few pounds recently.   PHYSICAL EXAMINATION:  VITAL SIGNS:  Blood pressure is 138/68, heart rate  90, respiratory  rate 16, temperature 100.6.  GENERAL:  No distress.  SKIN:  Not diaphoretic.  HEENT:  No proptosis, no periorbital swelling.  Pharynx, no erythema.  NECK:  Supple.  CHEST:  A few diffuse wheezes.  There are a few rales at the left base only.  CARDIOVASCULAR:  No JVD, no edema, regular rate and rhythm, no murmur.  Rhythm seems regular to my examination.  Pedal pulses are intact.  ABDOMEN:  Soft, obese, nontender, no hepatosplenomegaly, no mass.  BREASTS/GYNECOLOGIC/RECTAL EXAMINATIONS:  Not done at this time due to  patient condition.  NEUROLOGIC:  Alert, well oriented, does not appear anxious nor depressed.  Cranial nerves appear to be intact, and she readily moves all four's.   LABORATORY DATA:  Chest x-ray shows left lower lobe infiltrate.  WBC 11,900,  hemoglobin 10.6, sodium 134, BUN 41, creatinine 2.2.   IMPRESSION:  1.  Left lower lobe pneumonia.  2.  Renal insufficiency.  It is uncertain if this is new or old.  3.  Anemia.  It is uncertain if this is new or old.  4.  Type 2 diabetes.  5.  Diarrhea, which is usually viral.  6.  Other chronic medical problems as noted above.   PLAN:  1.  Blood cultures.  2.  Antibiotics.  3.  P.r.n. insulin.  4.  Bronchodilators.  5.  Despite her history of CHF, I believe in view of her diarrhea it is best      to hold the Demadex and give her some IV fluid.  6.  I discussed code status with patient and she requests full code.  7.  Outpatient evaluation of anemia and renal insufficiency if indicated.           ______________________________  Cleophas Dunker. Everardo All, M.D. [facility]     SAE/MEDQ  D:  02/28/2006  T:  02/28/2006  Job:  [id number]

## 2011-01-29 NOTE — Assessment & Plan Note (Signed)
[facility]                          CHRONIC HEART FAILURE NOTE   NAME:[name], [name] WESOLOWSKI                       MRN:          [id number]  DATE:11/21/2006                            DOB:          [birth year]    [name] returns today for followup of her congestive heart failure  which is secondary to diastolic dysfunction. I have not seen [name]  here in the Heart Failure Clinic since January. Apparently not long  after that visit, she had problems with her lungs and ended up  hospitalized for pneumonia which caused her to miss her appointment here  in February. Since that time, she says she has also been treated  outpatient for bronchitis, but today she states that she is feeling much  better. She is not completely over the bronchitis and states that she is  going to followup with Dr. Delford Field for further evaluation. She denies any  fever or chills, denies any shortness of breath at rest. She is dyspneic  on exertion. This is not a new finding for her. Interestingly enough,  her pulse ox on room air is 100% at rest. She states that she still has  a productive cough of thick, white sputum. No problems with medications,  orthopnea, PND. No pre-syncope or syncopal episodes.   PAST MEDICAL HISTORY:  1. Congestive heart failure, presumed secondary to diastolic      dysfunction. Echocardiogram a few years ago showed ejection      fraction of 65%. Most recent cardiac catheterization in April of      2006 showed ejection fraction of 65% with non-obstructive coronary      artery disease.  2. Mild pulmonary hypertension. Pulmonary pressures of 30-40 mmHg.  3. Coronary artery disease status post cardiac catheterization as      stated above. Showed non-obstructive disease. Most recently status      post percutaneous transluminal coronary angioplasty ascending of      the left anterior descending artery (LAD) in December 2002.  4. Morbid obesity.  5. Presumed  asystolic arrest, placement of a permanent pacemaker by      Dr. Graciela Husbands in 2002. The patient has a Medtronic Kappa 901 pulsed      generator by Dr. Berton Mount.  6. Syncope secondary to above.  7. Hypertension.  8. Chronic obstructive pulmonary disease requiring home O2 use with      multiple hospitalizations for pneumonia and bronchitis with      previous positive Pseudomonas sputum culture.  9. Diabetes Type 2.  10.Asthmatic component.  11.Chronic steroid use secondary to chronic obstructive pulmonary      disease.  12.Stasis dermatitis to lower extremities.  13.Questionable history of atrial fibrillation.  14.Gout.  15.Osteoarthritis.  16.Remote history of tobacco use.  17.Dyslipidemia.  18.Chronic renal insufficiency.   ALLERGIES:  CODEINE AND PENICILLIN.   CURRENT MEDICATIONS:  1. Diltiazem CD 180 mg daily.  2. Allopurinol 300 mg daily.  3. Aspirin 81 mg daily.  4. Spiriva inhaler once daily.  5. Protonix 40 mg daily.  6. Fluticasone ointment 0.005% applied to legs daily.  7. Ultram 50 mg one tablet b.i.d.  8. Klor-con 20 mEq b.i.d.  9. Demodex 20 mg, the patient takes three tablets in the morning and      two tablets every evening.  10.Flovent inhaler two puffs b.i.d.  11.DuoNeb's q.i.d.  12.Simvastatin 40 mg daily.  13.Phenergan p.r.n.  14.Lactulose p.r.n.  15.Tylenol p.r.n.  16.Nitroglycerine p.r.n.  17.The patient also takes NovoLog insulin 10 units subcutaneous at      breakfast, NovoLog 15 units at lunch and NovoLog 7 units at supper.  18.Starlix 60 mg by mouth t.i.d. before meals.  19.Lantus insulin 10 units subcutaneous daily.  20.Prednisone, the patient states that she has been on a titrating      dose from 40 mg down to 10 mg daily for the month of March.  21.Factive 320 mg one tablet daily.   PHYSICAL EXAMINATION:  Weight today is 184 pounds. Note, the patient is  only 4 feet, 11 inches. Blood pressure 144/79 with a heart rate of 97.  [name] is in  no acute distress. As a matter of fact, she looks the  best today that I have seen her in a while. She is very Chief Executive Officer and  states that she feels very good today.  No jugular vein distention noted at a 90 degree angle.  LUNGS:  She has rhonchi and wheezes to right lobes, otherwise clear to  auscultation.  CARDIOVASCULAR: Reveals an S1, S2, regular rate and rhythm.  ABDOMEN: Soft and nontender. Positive bowel sounds. Obese.  LOWER EXTREMITIES: The patient has chronic signs of vascular  insufficiency. She has non-pitting edema onto both lower extremities.   IMPRESSION:  No signs of volume overload at this time. I am going to go  ahead and check labs on the patient today and I do not see she has had  any done since she was in the hospital. Also, will check a CBC and have  the patient followup with Dr. Everardo All, her primary care physician. She  is concerned that she is anemic. Also, will check a TSH with her  increased heart rate and the patient is not a candidate for a beta-  blocker secondary to her severe chronic obstructive pulmonary disease.  Will consider increasing her Cardizem to higher dose if her heart rate  stays up and her blood pressure is mildly elevated today. The patient is  not on an ACE inhibitor secondary to her history of renal insufficiency.  Will wait for lab work results and then adjust medication as needed.  Also, am going to have the patient followup with Dr. Graciela Husbands for a routine  E.P. visit as it does not appear that she has seen him in a while. Last  visit was in October of last year for a device check. Will schedule  appointment when I see [name] back next month. Also, the patient is  to followup with Dr. Delford Field concerning her recent episode of bronchitis.      Dorian Pod, ACNP  Electronically Signed      Rollene Rotunda, MD, [facility]  Electronically Signed  MB/MedQ  DD: 11/21/2006  DT: 11/21/2006  Job #: [id number]   cc:   Gregary Signs A. Everardo All, MD

## 2011-01-29 NOTE — Discharge Summary (Signed)
[name], [name]                ACCOUNT NO.:  [id number]   MEDICAL RECORD NO.:  [id number]          PATIENT TYPE:  INP   LOCATION:  1337                         FACILITY:  [facility]   PHYSICIAN:  Valerie A. Felicity Coyer, MDDATE OF BIRTH:  1928-07-11   DATE OF ADMISSION:  12/06/2006  DATE OF DISCHARGE:  12/08/2006                               DISCHARGE SUMMARY   DISCHARGE DIAGNOSES:  1. Left greater than right lower extremity cellulitis, acute on      chronic stasis dermatitis.  Continue empiric antibiotics with      Avelox and doxycycline and outpatient followup.  2. Chronic stasis dermatitis.  3. Chronic diastolic heart failure, no acute exacerbation, but      increased Demadex this hospitalization, see below.  4. Oxygen-dependent chronic obstructive pulmonary disease, no acute      exacerbation.  Continue home treatment.  5. Type 2 diabetes, well-controlled with A1c of 5.8.  Continue home      treatment with Lantus and 3-times-daily NovoLog as well as Starlix.  6. Anemia, question chronic disease, slow drift over the last few      months and question delusional.  Discharge hemoglobin 8.6 with iron      studies pending.  Outpatient followup with primary medical doctor      for further evaluation and workup.  No signs or symptoms of acute      blood loss.  7. Chronic renal insufficiency; creatinine baseline is 1.8, discharge      creatinine 1.7.  Follow up as needed.  8. Dyslipidemia.  Continue home statin.  9. History of complete heart block, status post permanent pacemaker in      2002, stable.  10.Coronary artery disease, status post percutaneous coronary      intervention in December 2002 with catheterization, April 2006.      Continue aspirin and medication management.  No cardiac complaints.  11.Obesity.   DISCHARGE MEDICATIONS:  1. Avelox 400 mg once daily for 9 additional days.  2. Doxycycline 100 mg p.o. b.i.d. for 9 additional days.  3. Demadex 60 mg p.o. b.i.d.  (previously 60 mg a.m. plus 40 mg p.m.).   Other medications are as prior to admission and include:  1. Starlix 60 mg t.i.d. before meals.  2. Allopurinol 300 mg daily.  3. Aspirin 81 mg daily.  4. Spiriva once daily.  5. DuoNebs q.i.d.  6. Prednisone 10 mg daily.  7. Cardizem CD 180 mg daily.  8. Protonix 40 mg once daily.  9. Enulose 10 mL p.o. b.i.d. for constipation.  10.Lantus 15 units daily.  11.NovoLog 10 units at breakfast, 15 at lunch and 7 at supper.  12.Zocor 40 mg nightly.  13.Potassium 20 mEq b.i.d.   HOSPITAL FOLLOWUP:  Scheduled with primary care physician, Dr. Romero Belling, next week, Wednesday, April 2, at 10:00 a.m.  She is instructed  to keep her legs elevated while at rest in the meanwhile and continue  her antibiotics.  She will return to the emergency room or call MD if  fevers or increased redness and swelling over the weekend.  PROCEDURE THIS HOSPITALIZATION:  Lower extremity Doppler, which was  negative for deep venous thrombosis.   HOSPITAL COURSE:  PROBLEM #1 - CELLULITIS:  The patient is a 77 year old  overweight woman with chronic stasis dermatitis, who came to her  physician due to increased redness with itching over the anterior shins,  namely in the left leg.  Because of clinical evidence of cellulitis as  well as increased swelling, she was admitted to the hospital for IV  antibiotics as well as Doppler to rule out DVT; this as stated was  negative for thrombus.  She was started on IV Avelox and I added  doxycycline for possible empiric coverage of MRSA, though there are no  weeping wounds or open ulcers for which to culture.  I also increased  her Demadex slightly from 60 and 40 to 60 b.i.d. for hope for  improvement in edema and to improve infectious quality.  The patient did  have slight decrease in her edema as well as decrease in her erythema.  She remained afebrile during this hospitalization and slight decrease  her white count from  10.6 to 9.2 on the day of discharge.  She is very  anxious for discharge home, feels much better and agrees to continue  taking both Avelox and doxycycline as previously prescribed, as well as  increasing the Demadex as described and follow up with primary MD next  week with call for problems if they arise prior to that time.   PROBLEM #2 - ANEMIA:  This is likely related to the patient's chronic  disease, though we have noticed a slow drift in her hemoglobin from her  previous hospitalization to present point.  She has had no clinical  signs or symptoms of bleeding, no melena, loose stools, nausea, vomiting  or pain, no hemoptysis or hematuria.  Her hemoglobin at day of discharge  is 8.6 and she is hemodynamically stable.  Iron studies have been drawn  and are pending at time of this dictation.  Further outpatient followup  with primary MD next week on this issue as well as infectious issues  with workup to be planned on as-needed basis based on previous GI  evaluation and renal evaluation etc.   PROBLEM #3 - OTHER MEDICAL ISSUES:  The patient's other numerous medical  issues are as listed above and have been stable during this  hospitalization.  No changes have otherwise been made in her medication.   PHYSICAL EXAM AT TIME OF DISCHARGE:  Temperature 98.5, blood pressure  134/72, pulse of 84, respirations 18, saturating 97% on 2 L, which is  her chronic baseline.  GENERAL:  She is a ruddy, heavy-set, but very  pleasant woman in no acute distress who talks in a soft voice and use a  rolling walker for assistance, but is independently ambulatory at her  normal state of being.  LUNGS:  Clear to auscultation.  CARDIOVASCULAR:  Regular rate and rhythm.  ABDOMEN:  Protuberant and obese, but soft and  nontender.  Good bowel sounds throughout.  EXTREMITIES:  Show chronic  brawny thickening with a few areas of more intense redness on the left shin and just a mild spot of similar changes on the  right shin, slight  less tightening of the edema on day of discharge compared to previous  day and no extension of erythema line.  NEUROLOGICAL:  She is awake,  alert and oriented x4 and peripherally intact with motor and sensation.      Valerie A.  Felicity Coyer, MD  Electronically Signed     VAL/MEDQ  D:  12/08/2006  T:  12/08/2006  Job:  [id number]

## 2011-01-29 NOTE — Assessment & Plan Note (Signed)
[facility]                             PULMONARY OFFICE NOTE   ANIQUE, BECKLEY                       MRN:          [id number]  DATE:11/29/2006                            DOB:          [birth year]    [name] is returned today in follow-up.  This is a 77 year old white  female, history of chronic obstructive lung disease, asthmatic  bronchitis, congestive failure.  The patient has had a cough with thick  white mucus.  Dyspnea, though, has improved since the last visit.  During the last visit the patient was given pulse prednisone with a  taper and Factive for 7 days, and has now resolved symptom complex.   The patient is on:  1. Starlix 60 mg t.i.d.  2. Allopurinol 300 mg daily.  3. Aspirin 81 mg daily.  4. Spiriva daily.  5. Prednisone 10 mg daily.  6. Flovent 220 mcg strength two sprays b.i.d.  7. Cardizem LA 180 mg daily.  8. Oxygen 2 L continuous.  9. Protonix 40 mg daily.  10.DuoNeb q.i.d.  11.NovoLog and Lantus as directed.  12.Flonase two sprays each nostril daily.  13.Potassium one twice daily.  14.Demadex 60 mg a.m., 40 mg p.m.  15.Zocor 40 mg daily.   PHYSICAL EXAMINATION:  VITAL SIGNS:  On exam, temperature was 98, blood  pressure 104/68, pulse 50, saturations 98% on 2 L.  CHEST:  Diminished breath sounds with prolonged expiratory phase.  No  wheeze or rhonchi were noted.  CARDIAC:  Regular rate and rhythm without S3.  Normal S1, S2.  ABDOMEN:  Soft, nontender.  EXTREMITIES:  No edema, clubbing or venous disease.  SKIN:  Clear.  NEUROLOGIC:  Intact.  HEENT:  No jugular venous distention or lymphadenopathy.  Oropharynx  clear.  NECK:  Supple.   IMPRESSION:  Chronic obstructive lung disease, asthmatic bronchitic  component, stable at this time.   PLAN:  The patient is to maintain inhaled medicines as currently dosed  with no change in plan of care made.  We will see the patient back in  return follow-up in 1  months.     Charlcie Cradle Delford Field, MD, [facility]  Electronically Signed    PEW/MedQ  DD: 11/29/2006  DT: 11/30/2006  Job #: [id number]   cc:   Gregary Signs A. Everardo All, MD

## 2011-01-29 NOTE — Assessment & Plan Note (Signed)
[facility]                   COUMADIN / CHRONIC HEART FAILURE CLINIC NOTE   [name], [name]                       MRN:          [id number]  DATE:05/17/2006                            DOB:          12/18/1927    [name] returns today for further evaluation, and medication titration,  of her congestive heart failure secondary to diastolic dysfunction.   HISTORY OF PRESENT ILLNESS:  I last saw [name] back in July, at which  time she had recently been discharged home from the hospital with left lower  lobe pneumonia.  At that time, [name] stated she was still not feeling  well.  Apparently she had a relapse and was hospitalized again from April 14, 2006 through April 24, 2006, with bronchitic asthmatic exacerbation  along with various other chronic medical problems.  She returns today  stating that she is feeling better; however, she is concerned about the  fluid retention and decreased urine output.  She apparently was discharged  home on Lasix which in the past has not been very beneficial for the patient  and we usually have to maintain her on Demadex regarding her heart failure.  She is denying any acute shortness of breath or dyspnea on exertion other  than her normal respiratory status.  No chest pain, presyncope, or syncope.  No orthopnea or PND; however, the patient sleeps in a hospital bed.  She  continues to reside at [facility], ambulating with her walker and  continuous O2.   PAST MEDICAL HISTORY:  1. Congestive heart failure secondary to diastolic dysfunction.      a.     Echocardiogram in 2003, showed an EF of 60% with trivial mitral       regurgitation.  There was mild pulmonary hypertension with pulmonary       pressure at 30-to-40-mmHg.  2. Coronary artery disease, status post PTCA and stenting of the LAD in      December 2002.      a.     Most recent cardiac catheterization in April 2006, showed an EF    of 65%.  3. Hypertension.  4. Chronic obstructive pulmonary disease with asthmatic bronchitis.  5. Diabetes.  6. Hyperlipidemia.  7. Stasis dermatitis.  8. Cholelithiasis.  9. Presumed asystolic arrest, status post permanent pacemaker placement in      2002, by Dr. Graciela Husbands.  10.Chronic renal insufficiency.   REVIEW OF SYSTEMS:  As stated above in history of present illness, otherwise  negative.   CURRENT MEDICATIONS:  1. Starlix.  2. K-Dur 20 b.i.d.  3. Lasix 40 b.i.d.  4. O2 2 liters.  5. Zocor 40.  6. Cardizem LA 180.  7. Flovent 1 puff b.i.d.  8. Enulose.  9. Altace 5 mg daily.  10.Allopurinol.  11.Aspirin 81 mg daily.  12.Spiriva.  13.Prednisone 10 mg daily.   PHYSICAL EXAMINATION:  VITAL SIGNS:  Weight 183, blood pressure 122/74,  pulse of 82.  GENERAL:  [name] is in no acute distress today.  She is pleasant and  alert.  NECK:  Supple without lymphadenopathy.  Neck reveals no jugular  venous  distention at 90 degrees.  CHEST:  Clear to auscultation bilaterally.  Poor inspiratory effort.  HEART:  Reveals a regular rate and rhythm, occasional irregular beat.  ABDOMEN:  Soft, nontender.  Positive bowel sounds.  EXTREMITIES:  With 1+ edema bilaterally in the ankle area.   A 12-lead EKG today showing a sinus rhythm at a rate of 78.   IMPRESSION:  A patient with stable diastolic dysfunction.   We will continue current medicines with the exception of her Lasix.  I am  going to place the patient back on her Demadex 40 mg b.i.d. as this seems to  be most therapeutic for her and I will have the assisted living check a B-  MET early next week.                                 Dorian Pod, ACNP    MB/MedQ  DD:  05/17/2006  DT:  05/17/2006  Job #:  [id number]

## 2011-01-29 NOTE — H&P (Signed)
[name], [name]                ACCOUNT NO.:  [id number]   MEDICAL RECORD NO.:  [id number]          PATIENT TYPE:  INP   LOCATION:  1337                         FACILITY:  [facility]   PHYSICIAN:  Sean A. Everardo All, MD    DATE OF BIRTH:  April 20, 1928   DATE OF ADMISSION:  12/06/2006  DATE OF DISCHARGE:                              HISTORY & PHYSICAL   REASON FOR ADMISSION:  Cellulitis.   HISTORY OF PRESENT ILLNESS:  [age]-year-old woman with several  days of moderate redness of the left leg with some associated swelling  and itching.   PAST MEDICAL HISTORY:  1. Congestive heart failure.  2. Chronic obstructive pulmonary disease.  3. Stasis dermatitis of the lower extremities.  4. Pacemaker for complete heart block.  5. Dyslipidemia.  6. Allergic rhinitis.  7. Type 2 diabetes.  8. Gout.  9. Diffuse osteoarthritis.  10.Coronary artery disease.   MEDICATIONS:  1. Lactulose 30 mL twice a day.  2. Zocor 40 mg daily.  3. Demadex 60 every morning, 40 every evening.  4. Potassium chloride 20 mEq twice daily.  5. NovoLog 10 breakfast, 15 lunch, 7 supper.  6. Lantus 15 units daily.   SOCIAL HISTORY:  She is widowed.  She lives at [facility]   FAMILY HISTORY:  She states her mother also had an itchy rash on her  legs.   REVIEW OF SYSTEMS:  No change in chronic shortness of breath.  The  patient denies the following:  Fever, weight gain, weight loss, syncope,  chest pain, excessive diaphoresis, nausea, vomiting, rectal bleeding,  abdominal pain, hematuria, incontinence, decreased urinary force,  anxiety and depression.   PHYSICAL EXAMINATION:  VITAL SIGNS:  Blood pressure 137/61.  Heart rate  94.  Temperature is 98.7.  The weight is 190.  GENERAL:  No distress.  Obese woman, sitting with an oxygen tank next to  her and tubing delivering oxygen.  No distress.  SKIN:  Not diaphoretic.  No rash, except as noted below.  HEENT:  No proptosis.  No periorbital  swelling.  Pharynx is normal.  NECK:  Supple.  CHEST:  Clear to auscultation.  CARDIOVASCULAR:  No JVD.  There is 1+ edema on the right and 3+ on the  left.  Regular rate and rhythm.  No murmur.  Pedal pulses are absent,  probably due to her edema.  ABDOMEN:  Soft, obese, nontender.  No hepatosplenomegaly.  No mass.  BREASTS:  Not done at this time due to her condition.  GYNECOLOGIC:  Not done at this time due to her condition.  RECTAL:  Not done at this time due to her condition.  EXTREMITIES:  There is erythema throughout most of the left lower  extremity distal to the knee.  It is also slightly warm and swollen.  NEUROLOGIC:  Alert and oriented.  Does not appear anxious, nor  depressed.  Cranial nerves appear to be intact and she readily moves all  4s.   IMPRESSION:  1. Chronic stasis dermatitis.  2. Left lower extremity cellulitis, probably due to numbers 1 and 3.  3.  Chronic edema exacerbated by her congestive heart failure.  4. Diabetes requiring 2 different types of insulin.  5. Other chronic medical problems as noted above.   PLAN:  1. Admit.  2. Blood cultures.  3. Antibiotics.  4. Decreased insulin for safety.  5. Elevate left leg.  6. Check STAT venous Dopplers of the left lower extremity.  7. Continue other outpatient medications.  8. Check chest x-ray looking for congestive heart failure.  9. I discussed code status with the patient.  She states she wants to      be full code, but would not want to be started, nor maintained on      artificial life support measures if there was not a reasonable      chance of a functional recovery.      Sean A. Everardo All, MD  Electronically Signed     SAE/MEDQ  D:  12/06/2006  T:  12/06/2006  Job:  [id number]

## 2011-01-29 NOTE — Discharge Summary (Signed)
[place]. [facility]  Patient:    [name], BACUS Visit Number: [id number] MRN: [id number]          Service Type: MED Location: 2000 2021 01 Attending Physician:  Learta Codding Dictated by:   Kinnie Scales. Reed Breech, M.D. Admit Date:  09/10/2001 Discharge Date: 09/13/2001   CC:         Dr. Luciana Axe, [facility]   Discharge Summary  DISCHARGE DIAGNOSES: 1. Dyspnea. 2. Chronic obstructive pulmonary disease. 3. Hypertension. 4. Diabetes mellitus. 5. History of pneumonia. 6. Hypercholesterolemia.  DISCHARGE MEDICATIONS: 1. K-Dur 20 mEq one p.o. q.d. 2. Glyburide 2 mg b.i.d. 3. Calan SR 240 mg 1-1/2 p.o. q.d. 4. Hydrochlorothiazide 25 mg q.d. 5. Serevent two puffs b.i.d. 6. Flovent 110 mcg one puff b.i.d. 7. Combivent two puffs q.4-6h. p.r.n. 8. Albuterol nebulizer 2.5 mg q.4h. as needed.  HOSPITAL FOLLOW-UP:  The patient will call [facility] for a follow-up appointment in one to two weeks.  DISCHARGE INSTRUCTIONS:  The patient is to follow a low-calorie, low-carbohydrate, and low-salt diet.  No activity restrictions are given.  HOSPITAL PROCEDURES:  The patient underwent a 2 D echo, which showed overall left ventricular systolic function normal, EF 55-65%, no wall motion abnormalities.  Aortic valve thickness mildly increased and mild mitral valve regurgitation.  HISTORY OF PRESENT ILLNESS:  A 77 year old female with a past medical history significant for COPD presented with a one-day history of shortness of breath after not being able to administer home nebulizer treatment secondary to power outage.  The patient was seen at [facility] ER, found to be 97% O2 saturation on room air but with persistent wheezing and shortness of breath and with no functioning nebulizer at home.  The patient was transferred to [facility] for 23-hour observation.  HOSPITAL COURSE:  #1 -  CHRONIC OBSTRUCTIVE PULMONARY DISEASE EXACERBATION: The patients albuterol  and Atrovent were changed to Combivent and with albuterol nebulizers q.2h. p.r.n.  _____ and Flovent were continued while hospitalized.  The patient had improved by the day of discharge and was ambulating in the room without oxygen requirement, had a few wheezes at discharge but was a candidate for discharge.  #2 -  HYPERTENSION:  The patients blood pressures were fairly regular with Calan SR and hydrochlorothiazide.  #3 -  DIABETES MELLITUS:  Well-controlled on Glyburide and ADA diet.  #4 -  LOWER EXTREMITY EDEMA:  She was given one dose of Lasix.  Echo and EKG were checked for LV function.  DISCHARGE HISTORY AND PHYSICAL:  The patient had improved significantly. Power was nearly restored at her house.  She was discharged home with appropriate therapy, see discharge medications, and was in stable condition. She was encouraged to follow up with Dr. Luciana Axe in one to two weeks.  Since the patient did have Combivent inhaler, it was thought that she was an excellent candidate for discharge. Dictated by:   Kinnie Scales. Reed Breech, M.D. Attending Physician:  Learta Codding DD:  10/04/01 TD:  10/04/01 Job: [id number]

## 2011-01-29 NOTE — Discharge Summary (Signed)
[name], [name]                ACCOUNT NO.:  [id number]   MEDICAL RECORD NO.:  [id number]          PATIENT TYPE:  INP   LOCATION:  5020                         FACILITY:  [facility]   PHYSICIAN:  Rene Paci, M.D. LHCDATE OF BIRTH:  May 29, 1928   DATE OF ADMISSION:  06/28/2005  DATE OF DISCHARGE:  07/06/2005                           DISCHARGE SUMMARY - REFERRING   DISCHARGE DIAGNOSES:  1.  Gout, right lower extremity.  2.  Acute renal insufficiency.  3.  Klebsiella urinary tract infection.   HISTORY OF PRESENT ILLNESS:  The patient is a 77 year old white female,  resident of [facility], who presented via EMS from the assisted living  with complaints of severe right ankle pain, which started at 8:00 a.m. the  day of admission. The patient was unable to weight bear secondary to severe  pain and reported no history of trauma.   PAST MEDICAL HISTORY:  1.  Diabetes type 2.  2.  Chronic obstructive pulmonary disease.  3.  Hypertension.  4.  Left bundle branch block.  5.  History of syncope status post pacemaker placement 2002.  6.  Hyperlipidemia.  7.  O2 as needed.   HOSPITAL COURSE BY PROBLEM:  1.  SEVERE RIGHT FOOT PAIN SECONDARY TO GOUT:  The patient was admitted and      was noted to have an elevated white blood cell count of 15.2 and an ESR      of 81. An orthopedic consult was obtained and patient was seen by Dr.      Lequita Halt and he felt that there was no need for aspiration of the joint      and that it was likely secondary to gout. The patient was started on      colchicine and was also placed on IV Solu-Medrol. The patient had      immediate relief from Solu-Medrol, which was tapered to a p.o.      prednisone taper. The patient was evaluated by physical therapy who      recommends going back to assisted living with home health physical      therapy.   1.  KLEBSIELLA URINARY TRACT INFECTION:  The patient was noted to have a      urine culture, which grew  greater than 100,000 Klebsiella, which was      sensitive to ciprofloxacin. The patient was treated with a 5 day course      of ciprofloxacin.   1.  HYPONATREMIA:  On admission, the patient's sodium was 129. Demodex was      held and this will also be held at discharge. Will defer to primary care      to possibly resume as an outpatient.   1.  RENAL INSUFFICIENCY:  On admission, the patient's creatinine was 2.5,      which appeared to be above the patient's baseline of 1.9. This will need      to be repeated as an outpatient as well. A renal ultrasound was      performed, which showed bilateral renal cysts and cortical thinning.   DISCHARGE MEDICATIONS:  1.  Nitrostat 0.4 mg sublingual every 5 minutes x3 as needed for chest pain.  2.  Starlix 120 mg p.o. daily before meals.  3.  Cardizem 100 mg p.o. daily  4.  Aspirin 81 mg p.o. daily.  5.  Spiriva 1 capsule inhaled daily.  6.  Altace 5 mg p.o. b.i.d.  7.  Zocor 10 mg p.o. daily.  8.  Flovent 2 puffs daily.  9.  Colchicine 0.6 mg p.o. b.i.d.  10. Prednisone 20 mg p.o. on July 07, 2005, then decrease to 10 mg p.o.      on October 26 and July 09, 2005 and then stop.  11. Imodium as needed.  12. __________ 500 mg p.o. b.i.d.   PHYSICAL EXAMINATION:  VITAL SIGNS:  Blood pressure 124/76, heart rate 78,  respiratory rate 12, temperature 97.5. O2 sat 97% on room air.  GENERAL:  The patient is an obese, elderly female who is awake, energetic,  smiling, in no acute distress.  CARDIOVASCULAR:  S1 and S2. Regular rate and rhythm.  LUNGS:  Positive expiratory wheeze was noted on the left side. No shortness  of breath noted. No dyspnea.  ABDOMEN:  Soft, nontender, and nondistended.  EXTREMITIES:  2+ lower extremity edema.  NEUROLOGIC:  Awake, alert, and moving all extremities.   LABORATORY DATA:  At discharge BUN 85, creatinine 2.1. Hemoglobin and  hematocrit 10.6 and 31.1.   FOLLOW UP:  The patient is to followup with Dr. Everardo All  on July 02, 2005  at 10:00 a.m.  At this visit, she will need to have a repeat of her B-met  and at this time, could consider resuming Zaroxolyn and K-Dur, if the  patient's sodium and renal function are stable. Will defer to primary care.      Melissa S. Peggyann Juba, NP      Rene Paci, M.D. [facility]  Electronically Signed    MSO/MEDQ  D:  07/06/2005  T:  07/06/2005  Job:  [id number]   cc:   Gregary Signs A. Everardo All, M.D. [facility]  [address]  [place]  [place] [address]

## 2011-01-29 NOTE — Cardiovascular Report (Signed)
[place]. [facility]  Patient:    [name], [name] Visit Number: [id number] MRN: [id number]          Service Type: MED Location: 3700 3731 [id number] Attending Physician:  Lenoria Farrier Dictated by:   Arturo Morton Riley Kill, M.D. [facility] Proc. Date: 09/01/01 Admit Date:  08/31/2001   CC:         CV Laboratory  Bruce R. Juanda Chance, M.D. [facility]   Cardiac Catheterization  INDICATIONS: The patient is a 77 year old female who presents with an out-of-hospital ventricular fibrillation arrest. Apparently the rhythm was not documented. Nonetheless, the findings suggest an out-of-hospital arrest, therefore, it was felt that further evaluation was indicated.  PROCEDURES: 1. Left heart catheterization. 2. Selective coronary arteriography. 3. Selective left ventriculography.  DESCRIPTION OF PROCEDURE: The procedure was performed from the right femoral artery using 6 French catheters.  She tolerated the procedure well and there were no complications.  HEMODYNAMICS: The central aortic pressure was 171/79, left ventricular pressure was 153/23. There was no gradient on pullback across the aortic valve.  ANGIOGRAPHIC DATA: 1. Ventriculography in the RAO projection reveals preserved global systolic    function. Ejection fraction was calculated at 61%. No segmental    abnormalities were identified. 2. The left main coronary artery was mildly calcified but without critical    disease. 3. The left anterior descending artery coursed to the apex after the first    septal and tiny first diagonal. There is an 80% segmental stenosis leading    into the second diagonal. The distal LAD has some luminal irregularities    but no high-grade areas of disease and wraps the apex. 4. There is a small ramus intermedius without critical disease. 5. The circumflex has some luminal irregularities with about a 30-40%    eccentric plaque proximally. The distal vessel consists of a bifurcating  marginal of which a sub-branch has about 40% narrowing. The AV circumflex    is without critical disease. 6. The right coronary artery consistently demonstrated an ostial stenosis    of about 70-80%. The mid vessel consisted of no significant focal narrowing    and divided into a large acute marginal branch and then an AV portion    of the right coronary. The distal right coronary had a small PDA and a    larger posterolateral branch.  CONCLUSIONS: 1. Preserved left ventricular function. 2. High-grade disease of the mid left anterior descending artery and proximal    right coronary.  DISPOSITION: I plan to review the films with Dr. Juanda Chance. The best option is unclear at this time. We will review all of the options. Dictated by:   Arturo Morton Riley Kill, M.D. [facility] Attending Physician:  Lenoria Farrier DD:  09/01/01 TD:  09/03/01 Job: [id number]

## 2011-01-29 NOTE — Assessment & Plan Note (Signed)
[facility]                          CHRONIC HEART FAILURE NOTE   NAME:[name], [name] SIMAO                       MRN:          [id number]  DATE:12/13/2006                            DOB:          08-11-1928    [name] returns today for followup of her congestive heart failure  which is secondary to diastolic dysfunction. [name] recently  discharged from [facility] where she was admitted by internal  medicine for lower extremity cellulitis, acute on chronic stasis  dermatitis and treated with Avelox and doxicycline. Her heart failure  was stable during that admission. She did have mild titration of her  diuretic. Her Demadex was increased to 60 mg b.i.d. She states other  than the lower extremity swelling/cellulitis, she is doing well. She is  comfortable with her respiratory/heart failure symptoms at this time.   PAST MEDICAL HISTORY:  1. Congestive heart failure presumed secondary to diastolic      dysfunction. Echocardiogram in the past showed an EF of 25%. Most      recent cardiac catheterization in 2006 showed an EF of 65% with      nonobstructive disease.  2. Mild pulmonary hypertension. Pulmonary pressures of 30-40 mmHg.  3. Coronary artery disease status post cardiac catheterization,      nonobstructive disease status post percutaneous transluminal      coronary angioplasty to the LAD in December 2002.  4. Morbid obesity.  5. Recent hospitalization for cellulitis, acute on chronic stasis      dermatitis.  6. Presumed asystolic arrest. Placement of a permanent pacemaker by      Dr. Graciela Husbands in 2002 (Medtronic Kappa 901 pulse generator).  7. Hypertension.  8. COPD requiring home O2 with multiple hospitalizations for pneumonia      and bronchitis with previous positive pseudomonas sputum culture.  9. Diabetes type 2.  10.Asthmatic component to COPD.  11.Chronic steroid use.  12.Questionable history of atrial fibrillation.  13.Gout.  14.Osteoarthritis.  15.Dyslipidemia.  16.Chronic renal insufficiency.  17.Remote history of tobacco use.   ALLERGIES:  PENICILLIN and CODEINE.   CURRENT MEDICATIONS:  1. Diltiazem CD 180 mg daily.  2. Allopurinol 300 mg daily.  3. Aspirin 81 mg daily.  4. Spiriva inhaler once daily.  5. Protonix 40 mg daily.  6. Fluticasone ointment 0.005 mg applied to legs daily.  7. Ultram 50 mg p.o. b.i.d.  8. DuoNebs q.i.d.  9. Flovent inhaler 2 puffs b.i.d.  10.Demodex 60 mg b.i.d.  11.Avelox 400 mg x1 more day.  12.Doxicycline 100 mg p.o. b.i.d. x1 more day.  13.Starlix 60 mg p.o. t.i.d. before meals.  14.Prednisone 10 mg daily.  15.Enulose 10 mL p.o. b.i.d. for constipation.  16.Lantus insulin 15 units daily.  17.NovoLog 10 units at breakfast, 15 units at lunch and 7 units at      supper.  18.Potassium 20 mEq p.o. b.i.d.   REVIEW OF SYSTEMS:  As stated above.   PHYSICAL EXAMINATION:  VITAL SIGNS:  Weight 190 pounds. The patient's  weight is up 6 pounds today. Blood pressure 160/68 with a heart rate of  89.  GENERAL:  [name] is in no acute distress. She is her usual pleasant  self. Alert and oriented x3.  NECK:  No jugular vein distention noted at 90 degree angle.  LUNGS:  She has rhonchi throughout.  CARDIOVASCULAR:  Reveals an S1 and S2. Regular rate and rhythm.  ABDOMEN:  Soft and nontender. Positive bowel sounds. Obese.  LOWER EXTREMITIES:  With chronic stasis dermatitis, redness over the  anterior shins most pronounced on the left lower extremity. Patient with  nonpitting edema.   IMPRESSION:  Stable class 2 to early class 3 heart failure in the  setting of chronic obstructive pulmonary disease. Will continue current  medications and repeat lab work today. The patient has not seen EP in  several months. Will have her schedule an appointment to followup. We  will also have the patient followup with Dr. Everardo All concerning her  lower extremity cellulitis as she  states it does not seem to have  improved anymore and she has one day left on her antibiotics. The  patient's primary cardiologist is Dr. Gala Romney, primary care Dr. Romero Belling, pulmonary Dr. Shan Levans.      Dorian Pod, ACNP  Electronically Signed      Rollene Rotunda, MD, [facility]  Electronically Signed   MB/MedQ  DD: 12/13/2006  DT: 12/13/2006  Job #: [id number]

## 2011-01-29 NOTE — Assessment & Plan Note (Signed)
[facility]                             PULMONARY OFFICE NOTE   NAME:[name], [name]                       MRN:          [id number]  DATE:08/24/2006                            DOB:          [birth year]    Ms. [name] returns today in follow-up for chronic obstructive lung  disease. She was hospitalized with COPD exacerbation from the end of  October to the second of November. Sent home with prednisone and Avelox  for which she has completed course of therapy. She is currently  maintained on Flovent 220 mcg strength 2 sprays b.i.d., prednisone 10 mg  daily, Spiriva daily, aspirin 81 mg daily, Allopurinol 300 mg daily,  Altace 5 mg daily, Cardizem 180 mg daily, Zocor 40 mg daily, Demodex 40  mg b.i.d., Zaroxolyn 2.5 mg daily, Humalog sliding scale, Duo-Neb x4  daily.   PHYSICAL EXAMINATION:  VITAL SIGNS: Temperature is 98, blood pressure  130/80, pulse 112, saturation 95% on room air.  CHEST: Showed distant breath sounds, few scattered rhonchi, a few  expiratory wheezes.  CARDIAC EXAM: Showed a regular rate and rhythm without S3, normal S1,  S2.  ABDOMEN: Was protuberant, bowel sounds were active.  EXTREMITIES: Showed no clubbing but did show 2+ edema. There was no  venous disease.  SKIN: Was clear.  HEENT EXAM: Showed mild jugular venous distension. Oropharynx clear.  Nares were clear.  NECK: Supple.   IMPRESSION:  Acute bronchitis with mild flare and ongoing fluid  retention with anasarca.   PLAN:  The patient is to increase Demodex to 1.5 twice daily and to  begin Avelox 400 mg daily for a 6-day course, and to hold off on further  predinsone pulse. Maintain nebulizer treatments as currently dosed, and  will see this patient back in return follow-up in a month's time.     Charlcie Cradle Delford Field, MD, [facility]  Electronically Signed    PEW/MedQ  DD: 08/26/2006  DT: 08/26/2006  Job #: [id number]   cc:   Gregary Signs A. Everardo All, MD

## 2011-01-29 NOTE — Discharge Summary (Signed)
[name], [name]                ACCOUNT NO.:  [id number]   MEDICAL RECORD NO.:  [id number]          PATIENT TYPE:  INP   LOCATION:  1428                         FACILITY:  [facility]   PHYSICIAN:  Rosalyn Gess. Norins, MD  DATE OF BIRTH:  1928/01/27   DATE OF ADMISSION:  07/11/2006  DATE OF DISCHARGE:  07/15/2006                                 DISCHARGE SUMMARY   ADMITTING DIAGNOSES:  1. Exacerbation of chronic obstructive pulmonary disease with question of      bronchial pneumonia.  2. Chronic obstructive pulmonary disease, O2 and steroid dependent.  3. Type 2 diabetes on Starlix of home.  4. Congestive heart failure number  5. Atrial fibrillation.  6. Chronic renal failure.  7. Anemia.  8. Gout.   DISCHARGE DIAGNOSES:  1. Asthmatic bronchitis with question of left lower lobe infiltrate,      improved.  2. Chronic obstructive pulmonary disease, O2 and steroid dependent.  3. Type 2 diabetes on Starlix of home.  4. Congestive heart failure number  5. Atrial fibrillation.  6. Chronic renal failure.  7. Anemia.  8. Gout.   CONSULTANTS:  None.   PROCEDURES:  1. Chest x-ray July 11, 2006 with cardiomegaly, vascular congestion.  2. Chest x-ray July 14, 2006 with left lower lobe atelectasis versus      infiltrate.   HISTORY OF PRESENT ILLNESS:  The patient is 77 year old woman followed by  Dr. Romero Belling for COPD and diabetes who presented on the day of admission  with worsening shortness of breath and cough productive of yellow sputum.  She had no chest pain, no syncope.  She was brought to emergency department  for evaluation and was admitted for management of exacerbation of her COPD  with possible infectious component.   HOSPITAL COURSE:  #1 - PULMONARY: The patient was started on IV steroids of  Solu-Medrol 80 mg IV q.6 h.  She was given IV Zithromax started on IV  Avelox.  The patient was continued on her bronchodilator therapies including  Xopenex.  The patient  did respond to therapy.  She was decreased to Solu-  Medrol q. 8 on July 12, 2006, reduced to Solu-Medrol 80 q. 12 on October  31, started on oral prednisone on November 1 at 30 mg b.i.d..  The patient  did have a significant leukocytosis but remained afebrile.  Chest x-ray  findings as noted.  The patient was converted to p.o. Avelox.  She was  clinically improved with significantly improved breath sounds and was felt  to be stable to discharge home on home oxygen and a prednisone burst and  taper down to her baseline of 10 mg q. day.  See medications as listed  below.   #2 - DIABETES.  The patient was followed on sliding scale.  Her blood sugars  were exacerbated by steroid use.  She will need to be discharged home on  continue sliding scale until her baseline steroid use.   #3 - CHF.  Patient with coarse rhonchi.  She was given IV Lasix.  She was  put back on oral Demadex  on November 1.  She had no  Signs of decompensation and was thought to be stable.   #4 - THRUSH.  The patient was complaining of a sore mouth.  She was started  [employer] with Xylocaine and will continue this for several more days  while on high-dose steroids.   #5 -  CHRONIC RENAL INSUFFICIENCY.  The patient had mild acute exacerbation  with creatinine rising to 2.6 from 1.9, down to 2.4 the day of discharge.   With patient's medical problems being stable with acute exacerbation of her  COPD being stable.  She is thought to be ready for discharge home.   DISCHARGE PHYSICAL EXAMINATION:  VITAL SIGNS:  Temperature of 97.2, blood  pressure 110/61, heart rate 77, respirations 20, O2 saturations 97% on 2  liters.  CBG was 273.  GENERAL APPEARANCE:  This is an overweight  chronically ill-appearing woman who is in no acute distress getting a  handheld nebulizer treatment.  CHEST:  Patient is moving air well.  She had no wheezes.  She had faint  coarse rhonchi at the bases.  CARDIOVASCULAR:  2+ radial pulse.   Her precordium was quiet.  She had a  regular rate and rhythm.  ABDOMEN:  Soft.   DISCHARGE LABORATORIES:  Final laboratory from July 15, 2006, hemoglobin  9.8 grams, white count was 13,600, platelets of 291,000.  Chemistries with  sodium 132, potassium 4.9, chloride 100, CO2 of 25, BUN of 82, creatinine  2.4, glucose was 295.  Urinalysis was negative.  Final chest x-ray as noted  above.   DISCHARGE MEDICATIONS:  The patient will resume her home medications  including:  1. Altace 5 mg daily.  2. Starlix 60 mg one tablet before breakfast, one tablet for lunch, two      tablets before supper.  3. Demadex 80 mg q.a.m.  4. Topicort 0.25% ointment to the left leg as needed.  5. Prednisone bursting taper at 40 mg q.a.m. x3 days, 30 mg q.a.m. x3      days, 20 mg q.a.m. times 6 days, and then back to 10 mg daily for      chronic maintenance.  6. Spiriva 18 mcg one inhalation daily.  7. Aspirin 81 mg daily.  8. Allopurinol 300 mg daily.  9. Potassium 20 mEq daily.  10.Cardizem LA 180 mg daily.  11.Zocor 40 mg daily.  12.Ultram 50 mg q.6 h p.r.n.  13.Avelox 400 mg daily for an additional 7 days.  14.Sliding scale insulin management using Humalog before meals with CBG      before meals, no bedtime coverage.  For 0-150, no insulin; 151-200, 3      units subcu; 201-250, 5 units subcu; 251-300 7 units subcu; 301 or      greater, 10 units subcu.  15.The patient will continue on home oxygen at 2 liters per nasal cannula.   DISPOSITION:  The patient is to return to an extended care facility.   FOLLOW UP:  The patient to see Dr. Romero Belling August 03, 2006 at 10:45  a.m..   CONDITION ON DISCHARGE:  Stable and improved.           ______________________________  Rosalyn Gess Norins, MD     MEN/MEDQ  D:  07/15/2006  T:  07/15/2006  Job:  [id number]

## 2011-01-29 NOTE — Discharge Summary (Signed)
[place]  Patient:    [name], [name] Visit Number: [id number] MRN: [id number]          Service Type: MED Location: 2000 2021 01 Attending Physician:  Learta Codding Dictated by:   Chinita Pester, C.R.N.P. Admit Date:  09/10/2001 Discharge Date: 09/13/2001                             Discharge Summary  PRIMARY DIAGNOSIS:  Presyncope.  SECONDARY DIAGNOSES: 1. Hypertension. 2. Chronic obstructive pulmonary disease. 3. Diabetes type 2.  HISTORY OF PRESENT ILLNESS:  This is a 77 year old female who was recently admitted for a VF asystole arrest on August 31, 2001 with subsequent PTCA 80% LAD lesion.  On that admission she had called EMS for shortness of breath.  She had bradycardia, VF to assytole with loss of consciousness, spontaneously converted to normal sinus rhythm; no telemetry. Similar episode on transportation, no telemetry again.  She had no further episodes.  No further workup was performed secondary to normal LV function and spontaneous conversion to sinus rhythm from ventricular fibrillation.  This admission, patient called EMS for complaint of presyncope.  She asked EMS if she was going to be placed on the monitor.  She closed her eyes and became unresponsive for a few seconds with no change in telemetry.  Blood sugar at that time was 300.  She had been in normal sinus rhythm with first-degree block since admission with PVCs on telemetry.  The patient stated first episode on December 19 when her friend was over, she was sitting in recliner, felt funny, then fell over, hitting her face; second time, before EMS arrived; and third time in hall of hospital.  Denies any previous episodes.  PAST MEDICAL HISTORY:  Status post PTCA to LAD September 01, 2001.  Mild left ventricular hypertrophy.  Coronary artery disease.  Hypertension.  COPD. Diabetes type 2.  EF is approximately 55-60%.  HOSPITAL COURSE:  During hospitalization patient  underwent an EP consult and a permanent pacemaker implantation was recommended.  Patient underwent placement of a Medtronic Kappa pacemaker on September 12, 2001.  She tolerated the procedure well, had no immediate postoperative complications, and was discharged on the following medications.  DISCHARGE MEDICATIONS: 1. Aspirin 325 daily. 2. Plavix 75 daily. 3. Zocor 40 daily. 4. Altace 2.5 daily. 5. Glyburide 5 twice a day. 6. Hydrochlorothiazide 25 mg daily. 7. Calan 360 daily. 8. K-Dur 20 mEq b.i.d. 9. Albuterol nebulizers p.r.n.  ACTIVITY:  She was instructed not to do any heavy lifting or strenuous activity with her left arm for four to six weeks.  She was not to raise her left arm above her head for one week and gradually raise it as depicted on discharge summary.  DIET:  Low fat/low cholesterol/low salt diabetic diet.  WOUND CARE:  She was not to get her wound wet for one week.  FOLLOW-UP:  [facility], September 20, 2001 at 9:15 to 11:30 a.m.  She was scheduled for a wound check in two weeks and follow up with Dr. Graciela Husbands in three months.  She was to call Victorino Dike at [phone] for her appointment. Dictated by:   Chinita Pester, C.R.N.P. Attending Physician:  Learta Codding DD:  10/25/01 TD:  10/26/01 Job: [id number]

## 2011-01-29 NOTE — Assessment & Plan Note (Signed)
[facility]                             PULMONARY OFFICE NOTE   NAME:SIMMONSWiletta, Bermingham                       MRN:          [id number]  DATE:01/10/2007                            DOB:          05-06-1928    HISTORY OF PRESENT ILLNESS:  The patient is a 77 year old white female  patient of Dr. Lynelle Doctor who has a known history of advanced COPD,  asthmatic bronchitis and congestive heart failure.  The patient presents  today for an acute office visit.  The patient complains of persistent  cough, congestion and wheezing.  The patient was seen 1 week ago by Dr.  Delford Field.  At that time was given a prednisone taper and increased Demadex  dose for increased lower extremity edema.  The patient also was started  on Avelox which she is currently on day 5 of 7.  The patient reports  that her symptoms are only minimally improved. She was seen in [facility] yesterday with persistent cough and congestion.  Lab work  revealed an elevated white count at 13,000.  A BNP was essentially  unchanged at 81.  A creatinine was holding stable at 1.7.  The patient  denies any hemoptysis, orthopnea, PND.  The patient does have lower  extremity edema but has been essentially unchanged.   PAST MEDICAL HISTORY:  Was reviewed in detail.   CURRENT MEDICATIONS:  Reviewed.   PHYSICAL EXAMINATION:  The patient is a pleasant chronically ill-  appearing white female in no acute distress.  She is afebrile with stable vital signs. O2 saturation is 98% on 2 L.  Weight is down 2 pounds at 189.  HEENT:  Is unremarkable.  NECK:  Is supple without  cervical adenopathy.  No JVD.  LUNGS:  Sounds reveal coarse breath sounds bilaterally with some upper  airway pseudo-wheezing.  The patient does have some few scattered  rhonchi.  CARDIAC:  Is a regular rate.  ABDOMEN:  Is morbidly obese, soft and nontender  EXTREMITIES:  Are warm without any calf tenderness, cyanosis, clubbing.  There is 2 to 3+ edema bilaterally with stasis dermatitic changes  bilaterally.   IMPRESSION AND PLAN:  Slow to resolve asthmatic bronchitic exacerbation.  Chest x-ray is pending at time of dictation.  The patient will extend  Avelox for additional 3 days, for a total of 10 days of therapy.  The  patient will taper off of prednisone over the next few days.  Add in  Mucinex DM twice daily.  The patient will also increase Protonix up to  twice a day for any residual reflux that could be irritating the  airways.  The patient does have quite a bit of pseudo-  wheezing on exam.  The patient will recheck her as scheduled in 2 weeks  with Dr Delford Field or sooner if symptoms do not improve or worsen.      Rubye Oaks, NP  Electronically Signed      Charlcie Cradle Delford Field, MD, [facility]  Electronically Signed   TP/MedQ  DD: 01/10/2007  DT: 01/10/2007  Job #: [id number]

## 2011-01-29 NOTE — Assessment & Plan Note (Signed)
[facility]                             PULMONARY OFFICE NOTE   NAME:[name], [name]                       MRN:          [id number]  DATE:09/02/2006                            DOB:          1928/04/04    HISTORY OF PRESENT ILLNESS:  The patient is a 77 year old white female  patient of Dr. Delford [name] who has a known history of COPD who was recently  seen in the office last week for a COPD flare and prescribed Avelox for  7 days. Had a prednisone taper. The patient reports that she did have  improvement; however, finished prescription yesterday and noticed that  symptoms are returning today with thick yellowish green mucus. She  denies any hemoptysis, orthopnea, PND or increased leg swelling. The  patient did have a chest x-ray done at her nursing home today and has  brought that with her.   PAST MEDICAL HISTORY:  Reviewed.   CURRENT MEDICATIONS:  Reviewed.   PHYSICAL EXAMINATION:  The patient is a pleasant female in no acute  distress. She is afebrile with stable vital signs. Her O2 saturation is  98% on 2 liters.  HEENT:  Unremarkable.  NECK:  Supple without adenopathy. No JVD.  LUNGS:  Sounds reveal coarse breath sounds bilaterally with a few  scattered rhonchi.  CARDIAC:  Regular rate and rhythm.  ABDOMEN:  Soft and obese.  EXTREMITIES:  Warm without any calf tenderness, cyanosis, or clubbing.  There is trace to 1+ edema.   DATA:  Was compared to previous x-rays and showed some chronic changes,  some mild bibasilar atelectasis, left greater than right. No acute  infiltrates noted.   IMPRESSION AND PLAN:  Slow to resolve chronic obstructive pulmonary  disease exacerbation. We will extend Avelox out for an additional five  days. Continue on her current regimen. Follow back up with Dr. Delford [name] as  scheduled in 2 weeks or sooner if needed.      Rubye Oaks, NP  Electronically Signed      [name] Cradle Delford Field, MD, [facility]  Electronically  Signed   TP/MedQ  DD: 09/02/2006  DT: 09/03/2006  Job #: [id number]

## 2011-01-29 NOTE — Assessment & Plan Note (Signed)
[facility]                               PULMONARY OFFICE NOTE   NAME:SIMMONSKemiah, Booz                       MRN:          [id number]  DATE:06/13/2006                            DOB:          1928-07-21    Ms. Sanson returns today in follow up.  This is a 77 year old white female  with advanced chronic obstructive lung disease, asthmatic bronchitis.  Continued fatigue and dyspnea is noted and coughing up thick yellow mucous.  Increased dyspnea is noted.  No chest pain is noted.  Patient maintains  DuoNeb on a p.r.n. basis, oxygen 2L continuous, Flovent one spray b.i.d. 220  mcg strength.  Other maintenance medicines are listed in the chart and are  correct as reviewed.  She maintains prednisone 10 mg daily and Spiriva  daily.   EXAM:  Temp 98, blood pressure 110/70, pulse 98, saturation 99% on 2L.  CHEST:  Showed distant breath sounds, expired wheezes noted.  CARDIAC EXAM:  Showed a regular rate and rhythm without S3, a normal S1, S2.  ABDOMEN:  Soft, nontender.  EXTREMITIES:  No edema or clubbing.  SKIN:  Clear.  NEUROLOGIC EXAM:  Intact.   IMPRESSION:  Chronic obstructive pulmonary disease exacerbation with acute  flare.   PLAN:  1. To pulse prednisone 40 mg a day, taper down about 10 mg every 3 days      until she gets to 10 mg a day and hold.  2. Use nebulized DuoNeb on a more regular basis q.i.d.  3. Increase Flovent to 2 sprays b.i.d.  4. Will see the patient back in follow up in 1 month.       Charlcie Cradle Delford Field, MD, [facility]      PEW/MedQ  DD:  06/13/2006  DT:  06/14/2006  Job #:  [id number]   cc:   Gregary Signs A. Everardo All, MD

## 2011-01-29 NOTE — Assessment & Plan Note (Signed)
[facility]                               PULMONARY OFFICE NOTE   NAME:[name], [name]                       MRN:          [id number]  DATE:07/18/2006                            DOB:          09/22/1927    Ms. Wiatrek returns today in followup.  This is a 77 year old white female,  history of hospitalization for pneumonia, COPD exacerbation a week ago, just  discharged this week.  She is finishing a course of Avelox and has finished  prednisone on a taper, maintains Spiriva daily, is on the aspirin 81 mg  daily, allopurinol 300 mg daily, Altace 5 mg daily, Cardizem 180 mg daily,  Zocor 40 mg daily, oxygen 2 L continuous, Lasix 40 mg b.i.d., potassium  twice daily, Starlix daily.  She is not getting her DuoNeb as previously  prescribed.   On exam this is an elderly white female in no distress.  Temperature 98.  Blood pressure 126/68, pulse 88, saturation 98% on 2 L.  CHEST:  Showed to be clear without evidence of wheeze, rale or rhonchi.  CARDIAC:  Showed a regular rate and rhythm without S3.  Normal S1 and S2.  ABDOMEN:  Was protuberant.  Bowel sounds active.  EXTREMITIES:  Showed no edema or clubbing.  SKIN:  Was clear.  NEUROLOGIC:  Was intact.  HEENT:  Showed no jugular venous distention, lymphadenopathy, oropharynx  clear.  NECK:  Supple.   IMPRESSION:  Is that of chronic lung disease with resolved pneumonia.   PLAN:  The patient is to maintain Spiriva, but restart DuoNeb q.i.d. and  restart Flovent at 220 mcg strength 2 sprays b.i.d. and we will see the  patient back in return followup.     Charlcie Cradle Delford Field, MD, [facility]  Electronically Signed    PEW/MedQ  DD: 07/18/2006  DT: 07/19/2006  Job #: [id number]   cc:   Gregary Signs A. Everardo All, MD

## 2011-01-29 NOTE — H&P (Signed)
[place]  Patient:    [name], [name] Visit Number: [id number] MRN: [id number]          Service Type: MED Location: [id number] Attending Physician:  Lenoria Farrier Dictated by:   Everardo Beals Juanda Chance, M.D. [facility] Admit Date:  08/31/2001   CC:         Dr. Luciana Axe, [facility]  Cardiopulmonary [facility]   History and Physical  PRIMARY CARE PHYSICIAN:  Dr. Luciana Axe at [facility].  HISTORY OF PRESENT ILLNESS:  [name] is 77 years old and has type 2 diabetes, chronic obstructive pulmonary disease, hypertension, and hyperlipidemia, but no prior history of known heart disease.  She developed increasing shortness of breath today and, while with a friend, they called EMS.  When EMS arrived they found her shortness of breath, and shortly after arrival she developed sinus bradycardia and then ventricular fibrillation followed by asystole with loss of consciousness but then with return to sinus rhythm and return of blood pressure without any cardioversion.  Unfortunately, this was not documented on telemetry strips.  She was transported to [facility], and shortly before arrival here she had another episode of ventricular fibrillation with loss of consciousness which resolved spontaneously.  This, again, was not documented on telemetry strips.  She did not have any chest pain, but she did say she was diaphoretic along with being short of breath before passing out.  PAST MEDICAL HISTORY: 1. Type 2 diabetes. 2. Chronic obstructive pulmonary disease. 3. Hypertension. 4. Hyperlipidemia. 5. Recent hospitalization for exacerbation of her pulmonary disease.  She had    an echocardiogram at that time which showed an ejection fraction of 55-65%    and mild LVH.  MEDICATIONS: 1. KCl 20 mEq b.i.d. 2. Calan SR 240, 1/2 tablet daily. 3. Hydrochlorothiazide 25 mg daily. 4. Glyburide 5 mg, 2 tablets b.i.d. 5. Combivent inhalers.  SOCIAL HISTORY, FAMILY  HISTORY, AND REVIEW OF SYSTEMS:  For details, please see Charlotte Sanes note.  PHYSICAL EXAMINATION:  VITAL SIGNS:  Blood pressure 132/63, pulse 70 and regular.  HEENT, NECK:  Pupils equal and round.  No venous distention.  Carotid pulses are full, without bruits.  CHEST:  Decreased breath sounds, but there were no rales or rhonchi.  The cardiac apex was quiet.  The first and second heart sounds were normal, and there were no murmurs, rubs, or gallops.  ABDOMEN:  Protuberant.  No organomegaly or pulsatile masses.  Peripheral pulses equal.  Trace peripheral edema.  EXTREMITIES:  No deformities.  NEUROLOGIC:  Physiologic.  LABORATORY DATA:  An ECG showed poor R-wave progression, nonspecific ST and T changes, not changed from previous ECGs.  Her initial troponin was 0.09.  IMPRESSION: 1. Probable out-of-hospital ventricular fibrillation arrest (not documented on    telemetry).  Rule out myocardial infarction. 2. Chronic obstructive pulmonary disease. 3. Type 2 diabetes. 4. Hypertension. 5. Hyperlipidemia.  RECOMMENDATIONS:  Will plan to admit the patient for observation.  Will obtain serial enzymes and cardiograms to rule out a myocardial infarction.  Will treat her with aspirin, Lovenox, Plavix, and continue her home medicines. Will plan on catheterization tomorrow to evaluate her for ischemic etiology for her ventricular fibrillation arrest. Dictated by:   Everardo Beals Juanda Chance, M.D. [facility] Attending Physician:  Lenoria Farrier DD:  08/31/01 TD:  09/01/01 Job: [id number]

## 2011-01-29 NOTE — Assessment & Plan Note (Signed)
[name]                             PULMONARY OFFICE NOTE   NAME:[name], [name]                       MRN:          [id number]  DATE:10/17/2006                            DOB:          15-Dec-1927    HISTORY OF PRESENT ILLNESS:  The patient is a 77 year old white female  patient of Dr. Lynelle [name] who has a known history of COPD with asthmatic  bronchitis, congestive heart failure and hypertension who presents for  an acute office visit, complaining of a 1-week history of productive  with thick yellow-green sputum, nasal congestion, increased shortness of  breath with activity.  The patient denies any hemoptysis, orthopnea or  PND.   PAST MEDICAL HISTORY:  Reviewed.   CURRENT MEDICATIONS:  Reviewed.   PHYSICAL EXAMINATION:  GENERAL:  The patient is a pleasant, chronically  ill-appearing female in no acute distress.  VITAL SIGNS:  She is afebrile with stable vital signs.  O2 saturation is  97% on two liters.  HEENT:  Unremarkable.  NECK:  Supple without adenopathy.  No jugular venous distention.  LUNGS:  Coarse breath sounds bilaterally with a few expiratory wheezes.  CARDIAC:  Regular rate and rhythm.  ABDOMEN:  Soft and nontender.  EXTREMITIES:  Warm without any calf tenderness.  No clubbing or edema.   IMPRESSION AND PLAN:  Chronic obstructive pulmonary disease  exacerbation.  The patient to be begin Avelox x7 days.  Mucinex DM twice  a day.  The patient is to increase prednisone up to 40 mg for 2 days,  decreasing by 10 mg every 2 days to her baseline of 10 mg.  The patient  will return back with Dr. Delford [name] in 2 weeks as scheduled of sooner if  needed.      Rubye Oaks, NP  Electronically Signed      Charlcie Cradle [name] Field, MD, [facility]  Electronically Signed   TP/MedQ  DD: 10/18/2006  DT: 10/18/2006  Job #: [id number]

## 2011-01-29 NOTE — Assessment & Plan Note (Signed)
[name]                             PULMONARY OFFICE NOTE   NAME:SIMMONSTawonda, [name]                       MRN:          [id number]  DATE:11/14/2006                            DOB:          December 03, 1927    [name] is a 77 year old white female with advanced chronic  obstructive lung disease, asthmatic bronchitic components, noting  increased dyspnea with activity and at rest, coughing up thick, green  mucus.  She was admitted in February and discharged on October 24, 2006, for COPD exacerbation.  She improved for a bit, then worsened over  the last several days.  She is short of breath walking a very short  distance.  She is coughing up thick, green mucus, having some chest  discomfort, symptoms getting progressively worse.   She is maintained on:   1. Starlix 60 mg three times a day  2. Allopurinol 300 mg daily.  3. Aspirin 81 mg daily.  4. Spiriva daily.  5. Prednisone 10 mg daily.  6. Flovent 2 sprays b.i.d.  7. Cardizem-CD 180 mg daily.  8. Oxygen 2 L continuous.  9. Demodex 40 mg b.i.d.  10.Protonix 40 mg daily.  11.DuoNeb q.i.d.   ON EXAM:  Temperature 98, blood pressure 112/68, pulse 107, saturation  95% on 2 L.  CHEST:  Showed diminished breath sounds with prolonged expiratory phase,  no wheezes or rhonchi were noted.  CARDIAC EXAM:  Showed a regular rate and rhythm without S3, normal S1,  S2.  ABDOMEN:  Soft, nontender.  EXTREMITIES:  Showed no edema or clubbing.  SKIN:  Clear.  NEUROLOGIC EXAM:  Intact.  HEENT EXAM:  Showed no jugular venous distention, no lymphadenopathy.  Oropharynx clear.  NECK:  Supple.   IMPRESSION:  Acute bronchitis with flare.   PLAN:  The patient is to receive Factive 320 mg daily for 7 days, pulse  prednisone up to 40 mg a day, then decrease by 10 mg every 4 days until  off.  We will see the patient back in return followup in one month.     Charlcie Cradle Delford Field, MD, [facility]  Electronically  Signed    PEW/MedQ  DD: 11/16/2006  DT: 11/16/2006  Job #: [id number]

## 2011-01-29 NOTE — Discharge Summary (Signed)
[name], STRYCHARZ                ACCOUNT NO.:  [id number]   MEDICAL RECORD NO.:  [id number]          PATIENT TYPE:  INP   LOCATION:  1321                         FACILITY:  [facility]   PHYSICIAN:  Rosalyn Gess. Norins, M.D. LHCDATE OF BIRTH:  Aug 22, 1928   DATE OF ADMISSION:  04/14/2006  DATE OF DISCHARGE:  04/24/2006                                 DISCHARGE SUMMARY   ADMITTING DIAGNOSES:  1. Bronchitic asthmatic exacerbation.  2. Constipation.  3. Swelling of the lower extremities.  4. Diabetes mellitus.  5. Hyperlipidemia.  6. Hypertension.  7. Renal insufficiency.   DISCHARGE DIAGNOSES:  1. Asthmatic bronchitis with asthmatic symptoms stable and improved.  2. Venous insufficiency with bilateral lower extremity edema.  3. Diabetes, stable, although the patient on steroids with elevated blood      sugars.  4. Hyperlipidemia, stable.  5. Hypertension, stable.  6. Renal insufficiency, stable.   CONSULTANTS:  None.   PROCEDURES:  1. A chest x-ray performed April 14, 2006 with cardiomegaly at pacemaker,      mild bronchitic changes.  No sign of infiltrate.  2. A chest x-ray performed April 16, 2006 with no significant change in      the right perihilar opacity, resolution of consolidation at left base.  3. A chest x-ray performed April 17, 2006 with persistent mass-like      density in the right upper lobe.  4. An abdominal x-ray performed April 23, 2006 with normal gas pattern,      with no free air noted.  Gallstone is seen.   HISTORY OF PRESENT ILLNESS:  The patient is a 77 year old woman with a  history of COPD and asthmatic bronchitis who presented to the emergency  department with shortness of breath.  She had been seen four weeks prior to  this admission with pneumonia, treated with antibiotics.  She had recurrent  shortness of breath.  The patient reports she was coughing with productive  sputum, it was green to yellow in color.  She had no fevers, nausea, chills  or  sweats.  She had no chest pain, PND or orthopnea.   Please see the dictated H&P for past medical history, family history, social  history and medications at admission.   ADMISSION EXAMINATION:  GENERAL:  Significant for a heart rate of 101.  The  patient was afebrile.  CHEST:  With diffuse expiratory wheezing.   ADMISSION LABORATORIES:  With a question of right perihilar infiltrates.  White count was 9900.  Electrolytes were normal.  Creatinine was 2.0,  glucose was 104, LFTs were normal.   HOSPITAL COURSE:  1. Respiratory:  The patient was initially started on IV steroids.  She      was attempted to be weaned down to p.o. steroids at 40 mg of prednisone      b.i.d., however, she had relapse of her wheezing and shortness of      breath.  The patient also was treated with Avelox 400 mg daily for five      days.  With the patient's recurrent shortness of breath, on April 19, 2006 she was restarted on Solu-Medrol 40 mg IV every 12 hours.  On this      regimen her wheezing was markedly improved.  The patient was once again      tried on oral steroids at 30 mg of prednisone b.i.d.  She tolerated      this well.  She was weaned down to 20 mg p.o. b.i.d. and continued to      do well.  The patient completed her full course of Avelox.  Her      temperature remained normal.  Followup chest x-rays as noted.  With the      patient's wheezing and shortness of breath under good control she was      felt to be stable and ready for discharge, to complete a steroid taper      at home.  2. Diabetes:  The patient with significant problems with hyperglycemia,      secondary to steroids.  She was matched with a sliding scale and      Lantus.  At the time of discharge her blood sugars continued to run      mildly elevated in the 200 range.  The plan is the patient will be      discharged home and will continue on Lantus at 25 units q.h.s.  We will      add to her regimen Amaryl 2 mg daily.  The  patient will need to      followup with Dr. Everardo All in regards to her blood sugars which may run      high on this regimen for further adjustment in her medications.  3. Pulmonary:  The patient has been noted to have a question of a      persistent mass in the right upper lobe, first documented on CT scan      March 02, 2006.  The patient will need to have followup of this in two      months, which would be the end of this month or the beginning of      September, with a repeat CT scan.  4. GI:  The patient has a problem with chronic constipation.  During her      hospital stay this was an issue.  She was continued on Chronulac.  She      had been given enemas.  She was tried on GoLYTELY, but was intolerant      of this.  Her KUB was unremarkable.  The patient was passing small      amounts of stool and it was felt she was stable and ready for discharge      home.   DISCHARGE EXAMINATION:  VITAL SIGNS:  Temperature was 97.8.  Blood pressure  114/62.  Heart rate 89.  Respirations 18.  Her O2 sat was 97% on 2 liters.  CBG was 218.  GENERAL APPEARANCE:  This is an overweight Caucasian woman in no acute  distress.  CHEST:  The patient is moving air well.  There are very faint end-expiratory  wheezing.  ABDOMEN:  Obese, soft, she had no guarding or rebound, or tenderness.   FINAL LABORATORY:  Last CBC on April 16, 2006 was white count of 12,500,  hemoglobin of 9.8 g, hematocrit 29.3%, with differential being 67% segs, 18%  lymphs, 10% monos.  Final chemistry from April 16, 2006:  Sodium 139,  potassium 4.3, chloride 108, CO2 of 25, BUN of 37, creatinine 1.5, calcium  91.  Liver functions from admission were normal.  Hemoglobin A1c from April 15, 2006 was 6%.  Lipid profile from April 15, 2006 with a cholesterol of  124, triglycerides 144, HDL was 38, LDL was 57.  TSH was normal at 1.748.  Urinalysis was unremarkable.  Urine pregnancy test was done and was negative.  Blood cultures were  negative at the time of discharge.  Sputum  gram stain with a few gram positive cocci, rare gram negative rods, rare  yeast, thought to be normal oropharyngeal flora on culture.   DISCHARGE MEDICATIONS:  The patient will continue her home medications,  including:  1. Flovent one puff twice per day.  2. Furosemide 20 mg daily.  3. Altace 5 mg daily.  4. Zocor 40 mg daily.  5. Darvocet p.r.n.  6. Nitroglycerin p.r.n.  7. Cardizem 180 mg daily.  8. Allopurinol 300 mg daily.  9. Potassium 10 mEq daily.  10.Aspirin 81 mg daily.  11.Spiriva 18 mcg one inhalation daily.  12.Albuterol and Atrovent hand-held nebs q.i.d. p.r.n.  13.The patient will continue on 2 liter oxygen at home.  14.Prednisone taper.  The patient will take 20 mg of prednisone b.i.d. for      three days.  The patient will then take 30 mg daily times five, 20 mg      daily times five, and then 10 mg daily until see by Dr. Everardo All.   DISPOSITION:  The patient will return to assisted living.  She needs to  followup Dr. Everardo All in 7 to 10 days.   CONDITION AT TIME OF DISCHARGE:  The patient's condition at time of  discharge dictation is stable and improved.           ______________________________  Rosalyn Gess Norins, M.D. [facility]     MEN/MEDQ  D:  04/24/2006  T:  04/24/2006  Job:  [id number]

## 2011-01-29 NOTE — H&P (Signed)
[name], [name]                ACCOUNT NO.:  [id number]   MEDICAL RECORD NO.:  [id number]          PATIENT TYPE:  INP   LOCATION:  3737                         FACILITY:  [facility]   PHYSICIAN:  Sean A. Everardo All, MD    DATE OF BIRTH:  June 04, 1928   DATE OF ADMISSION:  10/17/2006  DATE OF DISCHARGE:                              HISTORY & PHYSICAL   CHIEF COMPLAINT:  Shortness of breath.   HISTORY OF PRESENT ILLNESS:  [name] is a 77 year old woman with a  history of COPD with an asthmatic bronchitis component, as well as  coronary artery disease as listed below, who was seen originally in the  emergency room 3 days ago with symptoms of shortness of breath.  At the  time, she was diagnosed with a COPD exacerbation and discharged on  outpatient prednisone, as well as antibiotic therapy.  Since then, her  symptoms of shortness of breath both at rest and on exertion,  nonproductive cough have worsened.  She is now to a point where she is  no longer able to complete sentences.  Additionally, during this period,  she has had increased lower extremity edema, orthopnea.  She denies  chest pain.  Workup, in the emergency room, included laboratories, chest  x-ray and EKG, all of which were unremarkable.  Specifically, the EKG  showed a sinus rhythm with PVCs.   PAST MEDICAL HISTORY:  1. Congestive heart failure with diastolic dysfunction.  2. Hypertension.  3. Chronic pulmonary disease with asthmatic bronchitis component.  4. Coronary artery disease.  She is status post a PCI with stenting of      the LAD in December of 2002.  Most recent cardiac catheterization      was April of 2006, which showed an ejection fraction of 65%.  At      the time, the LAD had a 40% ostial stenosis and there was 50% in-      stent restenosis of the LAD.  The third cath showed movement or      irregularities.  The RCA has some ostial spasm; otherwise,      nonobstructive coronary artery disease was noted.  5. She has a history of obesity.  6. She has a presumed asystolic arrest, status post permanent      pacemaker implanted by Dr. Graciela Husbands in 2002.  7. She has a history of left jugular hypertrophy.  Most recent echo      demonstrated a 60% EF with trivial mitral regurgitation.  There is      mild pulmonary hypertension with PA systolic pressures estimated      between 30-40 mmHg.  8. She has a history of diabetes.  9. Hyperlipidemia.  10.Stasis dermatitis.  11.Cholelithiasis.   FAMILY HISTORY:  Significant for coronary artery disease and  hypertension.   SOCIAL HISTORY:  Unremarkable.  Lives in an assisted living center.   ALLERGIES:  1. PENICILLIN.  2. CODEINE.   MEDICATIONS:  Currently, the patient is on:  1. A 3 of a 5 day course of levofloxacin.  2. She is on a prednisone taper.  3. She is on fluticasone ointment p.r.n.  4. Protonix 40 mg daily.  5. Spiriva inhaler.  6. Allopurinol 300 mg daily.  7. Cardizem LA 180 mg daily.  8. Potassium chloride 20 mEq daily.  9. Flovent 220 mcg daily.  10.Duo-Neb.  11.Albuterol.  12.Zocor 40 mg daily.  13.Nitroglycerin p.r.n.  14.Aspirin 81 mg daily.  15.Insulin in a sliding scale.   PHYSICAL EXAMINATION:  GENERAL:  She is a pleasant.  She is in mild  respiratory distress respiratory rate approximately 20.  VITAL SIGNS:  Temperature is 97.  Pulse is 84.  Respiratory rate is 18.  Blood pressure is 134/75.  NECK:  Supple; however, there are jugular venous pulsations noted with  the head of the bed at approximately 45 degrees.  She had large V-waves,  perceived most prominent with inspiration; however, she did not truly  have Kussmaul.  She had no bruits appreciated.  PULMONARY EXAM:  She has poor diaphragmatic excursion with coarse breath  sounds bilaterally and expiratory wheeze with a prolonged IDE ratio.  ABDOMEN:  Obese, but soft with positive bowel sounds in all 4  extremities.  CARDIOVASCULAR:  Her PMI was mildly deviated.   There is regular S1 and  S2.  There is a soft 2/6 holosystolic murmur.  The murmur varies with  respirations, so while her echo demonstrates MR this is more suggestive  of TR.  EXTREMITIES:  Two to three plus edema bilaterally with stasis dermatitis  appreciated.   LABS:  An EKG was performed in the emergency room, this demonstrates a  normal sinus rhythm with PVCs.  Normal T-wave axis.  Normal QRS axis.  There is no recurring of injury.  Compared with a previous EKG from  January of 2008, there appeared to be no difference.   Creatinine 1.7, sodium 138, potassium 3.9, chloride 103, BUN 37, bicarb  32.  Hematocrit 34.0.  Point of care cardiac enzymes negative.  BNP 35.   ASSESSMENT/PLAN:  1. Chronic obstructive pulmonary disease exacerbation with probable      bronchitis component.  2. History of diastolic dysfunction.  3. History of asystole status post pacemaker.  4. Diabetes.  5. Obesity.   PLAN:  We will admit the patient to inpatient telemetry for cardiac  enzymes x3.  However, the patient's real issue here is chronic  obstructive pulmonary disease exacerbation, for which we will start her  on IV steroids and IV antibiotics.  The patient does not have any true  evidence of IM overload.  I suspect that her exam is mostly because of  increased right-sided pressures, therefore, we will continue patient's  outpatient diuretic regimen.  There is no need  for IV diuresis here.  Her current exam is, otherwise, stable, so there  is no need for repeat echocardiogram either.  We will continue  antibiotic therapy, as the patient is coming in for a chronic  obstructive pulmonary disease exacerbation.  This has been shown to be  helpful in the past.      Reuel Boom B. Haithcock, MD   Electronically Signed     ______________________________  Cleophas Dunker. Everardo All, MD    DBH/MEDQ  D:  10/18/2006  T:  10/18/2006  Job:  [id number]

## 2011-01-29 NOTE — Discharge Summary (Signed)
[name], [name]                ACCOUNT NO.:  [id number]   MEDICAL RECORD NO.:  [id number]          PATIENT TYPE:  INP   LOCATION:  1426                         FACILITY:  [facility]   PHYSICIAN:  Rosalyn Gess. Norins, M.D. [facility] OF BIRTH:  10-04-1927   DATE OF ADMISSION:  02/28/2006  DATE OF DISCHARGE:                                 DISCHARGE SUMMARY   ADMITTING DIAGNOSES:  1.  Left lower lobe pneumonia.  2.  Renal insufficiency.  3.  Anemia.  4.  Type 2 diabetes.  5.  Diarrhea.   DISCHARGE DIAGNOSES:  1.  Left lower lobe pneumonia.  2.  Renal insufficiency.  3.  Anemia.  4.  Type 2 diabetes.  5.  Diarrhea.   CONSULTANTS:  None.   PROCEDURES:  1.  Chest x-ray February 28, 2006, with focal airspace disease, left lower lobe,      mild cardiomegaly.  2.  Chest x-ray: March 02, 2006, with progression of bibasilar pneumonia.  3.  CT scan of the chest without contrast March 02, 2006, which showed left      lower lobe consolidation right middle lobe wedge shaped opacity which      may represent infectious infiltrates, also with some left eyelid      mediastinal adenopathy.   HISTORY OF PRESENT ILLNESS:  The patient is 77 year old woman who presented  to Dr. Everardo All in the office with 3-day history of productive cough.  She  had fever, nausea and some mild diarrhea.  At that examination, her blood  pressure was 138/68, heart rate was 90, respirations were 16, temperature  was 100.6.  Chest exam revealed diffuse wheezing with few rales at the left  base only.  Chest x-ray in the office showed left lower lobe infiltrate.  The patient had a white count 11,900.  The patient was admitted for  treatment of her pneumonia.Marland Kitchen  She was started on IV Avelox.  She had routine  laboratory follow-up including urine culture which was negative, blood  cultures that were negative at time of discharge, no sputum specimen was  obtained. The patient has a history of COPD and is O2 dependent at home.  During her hospital stay, she remained short of breath but she felt she was  at her baseline by the day of discharge.  With the patient being on oral  antibiotics and being stable with no increased shortness of breath with no  productive cough, she is felt to be stable to be discharged back to  [facility] where she can complete antibiotics as well as have close  observation.   PLAN:  1.  Return to [facility] on Avelox for an additional 6 days.  2.  Gout.  The patient had a uric acid level drawn which was normal at 4.4.      She had no flares of gout during this hospital stay.  3.  Chronic renal insufficiency.  On admission, the patient had a creatinine      of 2.2 with a BUN of 41.  She was hydrated and final laboratory on March 03, 2006, revealed a creatinine 1.7 with a BUN of 29.  At this point,      the patient is felt to be stable.  She should have follow-up basic      metabolic panel in regards to evaluation for her chronic renal      insufficiency.  Would defer to her primary care physician as to whether      she needs nephrology consultation.  4.  Chronic obstructive pulmonary disease.  The patient has been doing      relatively well over the last 24 hours..  She does have significant      dyspnea on exertion with exercise in ambulation.  Plan:  The patient to      continue her home medications as well oxygen.   DISPOSITION:  The patient is to be discharged back to [facility].  She  will need to have home health physical therapy and occupational therapy for  strengthening.Marland Kitchen  She will need to continue on oxygen at home as well as  needing portable oxygen and would recommend liquid oxygen system for  mobility reasons.   DISCHARGE EXAMINATION:  VITAL SIGNS:  Temperature 1.1, blood pressure  125/68, heart rate was 83, respirations 18, CBG was 160.  GENERAL APPEARANCE:  This is an overweight Caucasian woman who seems to be  in good spirits in no acute  distress.  CHEST:  Patient is moving air well.  I appreciated no rales, wheezes or  rhonchi.  CARDIOVASCULAR:  2+ radial pulse.  Her precordium was quiet.  She had a  regular rate and rhythm.  ABDOMEN:  Obese and nontender.   Final laboratory from March 03, 2006, with a hemoglobin of 9.2 g, white count  was 10,000 and platelet count  278,000.  Chemistries were normal with a  potassium 4.6, BUN 29, creatinine 1.7, glucose 119.   DISCHARGE MEDICATIONS:  The patient will continue on all her home  medications including  1.  Altace 5 mg b.i.d.  2.  Demadex 40 mg twice a day.  3.  Vytorin 10/20 nightly.  4.  Ultram 50 mg b.i.d. for pain.  5.  Phenergan 12.5 mg q.6h. p.r.n.  6.  Tylenol and Darvocet as needed.  7.  Sublingual nitroglycerin as needed.  8.  Starlix before meals.  9.  Cardizem CD 180 mg daily.  10. Aspirin 81 mg daily.  11. Potassium chloride 10 mEq daily.  12. Allopurinol 300 mg daily.  13. Flovent one inhalation b.i.d.  14. New medications will be Avelox 400 mg daily for 6 days.   The patient will be PT and OT as noted above..   The patient is to return for follow up with Dr. Romero Belling in  approximately 6 to 7 days, appoint to be scheduled.   CONDITION AT TIME OF DISCHARGE DICTATION:  Stable and improved.           ______________________________  Rosalyn Gess Norins, M.D. [facility]     MEN/MEDQ  D:  03/04/2006  T:  03/04/2006  Job:  [id number]   cc:   Gregary Signs A. Everardo All, M.D. [facility]  [address]  [place]  [place] [address]

## 2011-02-01 ENCOUNTER — Other Ambulatory Visit (INDEPENDENT_AMBULATORY_CARE_PROVIDER_SITE_OTHER): Payer: Medicare Other

## 2011-02-01 ENCOUNTER — Encounter: Payer: Self-pay | Admitting: Endocrinology

## 2011-02-01 ENCOUNTER — Ambulatory Visit (INDEPENDENT_AMBULATORY_CARE_PROVIDER_SITE_OTHER): Payer: Medicare Other | Admitting: Endocrinology

## 2011-02-01 VITALS — BP 158/64 | HR 85 | Temp 98.5°F | Resp 16 | Wt 184.2 lb

## 2011-02-01 DIAGNOSIS — Z136 Encounter for screening for cardiovascular disorders: Secondary | ICD-10-CM

## 2011-02-01 DIAGNOSIS — Z79899 Other long term (current) drug therapy: Secondary | ICD-10-CM

## 2011-02-01 DIAGNOSIS — E119 Type 2 diabetes mellitus without complications: Secondary | ICD-10-CM

## 2011-02-01 LAB — HEMOGLOBIN A1C: Hgb A1c MFr Bld: 6.4 % (ref 4.6–6.5)

## 2011-02-01 NOTE — Progress Notes (Signed)
Subjective:    Patient ID: [name], female    DOB: [birth year], 77 y.o.   MRN: [id number]  HPI Pt lives at [facility], where she is seen by a pcp.  She takes lantus qhs, novolog qac, and extra novolog prn.  she brings a record of her cbg's which i have reviewed today.  It varies from 100-200. It is slightly higher as the day goes on, but this trend is only slight.  She says she had a cbg of 50 after lunch, but there is no record of this in cbg record.  she brings a record of her cbg's which i have reviewed today. No change in her chronic doe. Past Medical History  Diagnosis Date  . COPD (chronic obstructive pulmonary disease)   . Diastolic congestive heart failure   . C. difficile colitis   . Hypertension   . Hyperlipidemia   . CAD (coronary artery disease)   . Anemia   . Type 2 diabetes mellitus   . Pacemaker 2002  . Cardiac arrest - asystole 2002  . Gallstones   . Pulmonary hypertension   . Renal insufficiency   . Osteoarthritis   . Allergic rhinitis   . Gout     Past Surgical History  Procedure Date  . Ptca 2002  . Appendectomy   . Vesicovaginal fistula closure w/ tah   . Pacemaker placement 2002    History   Social History  . Marital Status: Widowed    Spouse Name: N/A    Number of Children: 1  . Years of Education: N/A   Occupational History  . Retired     Hospital doctor  .     Social History Main Topics  . Smoking status: Former Smoker -- 2.0 packs/day for 25 years    Types: Cigarettes    Quit date: 09/13/1985  . Smokeless tobacco: Never Used  . Alcohol Use: No  . Drug Use: Not on file  . Sexually Active: Not on file   Other Topics Concern  . Not on file   Social History Narrative   Resides at [facility]    Current Outpatient Prescriptions on File Prior to Visit  Medication Sig Dispense Refill  . albuterol (PROVENTIL) (2.5 MG/3ML) 0.083% nebulizer solution Take 2.5 mg by nebulization 4 (four) times daily.        Marland Kitchen allopurinol  (ZYLOPRIM) 100 MG tablet Take 100 mg by mouth 2 (two) times daily.        Marland Kitchen aspirin 81 MG tablet Take 81 mg by mouth daily.        . budesonide (PULMICORT) 0.25 MG/2ML nebulizer solution Take 0.25 mg by nebulization 4 (four) times daily.        Marland Kitchen dextromethorphan (DELSYM) 30 MG/5ML liquid Take 60 mg by mouth every 12 (twelve) hours as needed.        . diltiazem (DILACOR XR) 240 MG 24 hr capsule Take 240 mg by mouth daily.        . fluocinonide (LIDEX) 0.05 % cream Apply as directed       . insulin aspart (NOVOLOG) 100 UNIT/ML injection 3 (three) times daily before meals. 6 units with breakfast, 5 with lunch, and 7 with evening meal      . insulin aspart (NOVOLOG) 100 UNIT/ML injection SSI before meals and at bedtime       . insulin glargine (LANTUS) 100 UNIT/ML injection Inject 15 Units into the skin at bedtime.        Marland Kitchen  isosorbide mononitrate (IMDUR) 30 MG 24 hr tablet Take 30 mg by mouth daily. 1/2 daily       . lactulose (CHRONULAC) 10 GM/15ML solution 30 mls by mouth daily- hold for diarrhea       . lidocaine (LIDODERM) 5 % Place 1 patch onto the skin daily. Remove & Discard patch within 12 hours or as directed by MD       . loratadine (CLARITIN) 10 MG tablet Take 10 mg by mouth daily as needed.        Marland Kitchen omeprazole (PRILOSEC) 20 MG capsule Take 20 mg by mouth daily.        . Polyvinyl Alcohol (AKWA TEARS OP) Apply 2 drops to eye 4 (four) times daily.       . rosuvastatin (CRESTOR) 20 MG tablet Take 20 mg by mouth daily.        . sennosides-docusate sodium (SENOKOT-S) 8.6-50 MG tablet Take 1 tablet by mouth at bedtime.        Marland Kitchen spironolactone (ALDACTONE) 25 MG tablet Take 25 mg by mouth daily.       Marland Kitchen tiotropium (SPIRIVA) 18 MCG inhalation capsule Place 18 mcg into inhaler and inhale daily.        Marland Kitchen torsemide (DEMADEX) 20 MG tablet Take 2 tablets two times daily        Allergies  Allergen Reactions  . Codeine     REACTION: unspecified  . Doxycycline   . Guaifenesin & Derivatives   .  Penicillins     REACTION: unspecified    Family History  Problem Relation Age of Onset  . Diabetes Mother   . Hypertension Mother   . Heart disease Father     BP 158/64  Pulse 85  Temp(Src) 98.5 F (36.9 C) (Oral)  Resp 16  Wt 184 lb 4 oz (83.575 kg)  SpO2 98%    Review of Systems Denies loc.     Objective:   Physical Exam elderly, frail, no distress  she is in a wheelchair.   Pulses: dorsalis pedis intact bilat.   Feet: no deformity.  no ulcer on the feet.  feet are of normal color and temp.  There is 2+ bilat leg edema.  There is bilat onychomycosis. Neuro: sensation is intact to touch on the feet  Lab Results  Component Value Date   HGBA1C 6.4 02/01/2011     Assessment & Plan:  Dm, overcontrolled.

## 2011-02-01 NOTE — Patient Instructions (Addendum)
blood tests are being ordered for you today.  i'll forward the results to your facility.  pending the test results, please: Change novolog to 6 units with breakfast, 5 with lunch, and 7 with evening meal. Same lantus, and same prn novolog.   check your blood sugar 4 times a day--before the 3 meals, and at bedtime.  also check if you have symptoms of your blood sugar being too high or too low.  please keep a record of the readings and bring it to your next appointment here.  please call us sooner if you are having low blood sugar episodes. good diet and exercise habits significanly improve the control of your diabetes.  please let me know if you wish to be referred to a dietician.  high blood sugar is very risky to your health.  you should see an eye doctor every year. controlling your blood pressure and cholesterol drastically reduces the damage diabetes does to your body.  this also applies to quitting smoking.  please discuss these with your doctor.  you should take an aspirin every day, unless you have been advised by a doctor not to. Please make a follow-up appointment in 3 months (update:  Reduce lantus to 14 units qhs. Reduce scheduled novolog to 5 units with breakfast and lunch, and 7 units with evening meal). [facility]

## 2011-02-03 ENCOUNTER — Encounter: Payer: Self-pay | Admitting: Critical Care Medicine

## 2011-02-09 ENCOUNTER — Ambulatory Visit: Payer: Medicare Other | Admitting: Endocrinology

## 2011-02-09 ENCOUNTER — Encounter: Payer: Self-pay | Admitting: Internal Medicine

## 2011-02-16 ENCOUNTER — Ambulatory Visit (INDEPENDENT_AMBULATORY_CARE_PROVIDER_SITE_OTHER): Payer: Medicare Other | Admitting: Internal Medicine

## 2011-02-16 DIAGNOSIS — I2510 Atherosclerotic heart disease of native coronary artery without angina pectoris: Secondary | ICD-10-CM

## 2011-02-16 DIAGNOSIS — Z950 Presence of cardiac pacemaker: Secondary | ICD-10-CM | POA: Insufficient documentation

## 2011-02-16 DIAGNOSIS — I495 Sick sinus syndrome: Secondary | ICD-10-CM

## 2011-02-16 NOTE — Progress Notes (Signed)
HPI  [name] is a 77 y.o. female  seen in followup for syncope for which he underwent pacemaker implantation some time ago. She had no recurrent syncope.  She denies syncope and chest pain Echo May 2009  Normal LV function  Catheterization April 2006 demonstrated LAD 40% stenosis.  This proximal stent which was patent with 40-50% stenosis.  First diagonal had ostial 60% stenosis, second diagonal was normal.  The circumflex had luminal irregularities.  There was a ramus intermediate with ostial 60% stenosis, mid obtuse marginalhad luminal irregularities, the right coronary artery is dominant with luminal irregularities.  The patient had PTCA of the LAD in 2002) Past Medical History  Diagnosis Date  . COPD (chronic obstructive pulmonary disease)   . Diastolic congestive heart failure   . C. difficile colitis   . Hypertension   . Hyperlipidemia   . CAD (coronary artery disease)   . Anemia   . Type 2 diabetes mellitus   . Pacemaker 2002  . Cardiac arrest - asystole 2002  . Gallstones   . Pulmonary hypertension   . Renal insufficiency   . Osteoarthritis   . Allergic rhinitis   . Gout   . Stasis dermatitis   . Sinoatrial node dysfunction   . Syncope   . Postsurgical percutaneous transluminal coronary angioplasty status   . Dyslipidemia   . Acute bronchitis   . History of cholelithiasis   . Obesity     Past Surgical History  Procedure Date  . Ptca 2002  . Appendectomy   . Vesicovaginal fistula closure w/ tah   . Pacemaker placement 2002    Current Outpatient Prescriptions  Medication Sig Dispense Refill  . albuterol (PROVENTIL) (2.5 MG/3ML) 0.083% nebulizer solution Take 2.5 mg by nebulization 4 (four) times daily.        Marland Kitchen allopurinol (ZYLOPRIM) 100 MG tablet Take 100 mg by mouth 2 (two) times daily.        Marland Kitchen aspirin 81 MG tablet Take 81 mg by mouth daily.        . budesonide (PULMICORT) 0.25 MG/2ML nebulizer solution Take 0.25 mg by nebulization 4 (four) times  daily.        Marland Kitchen dextromethorphan (DELSYM) 30 MG/5ML liquid Take 60 mg by mouth every 12 (twelve) hours as needed.        . diltiazem (DILACOR XR) 240 MG 24 hr capsule Take 240 mg by mouth daily.        . ferrous sulfate 325 (65 FE) MG tablet Take 325 mg by mouth 2 (two) times daily.        . fluocinonide (LIDEX) 0.05 % cream Apply as directed       . insulin aspart (NOVOLOG) 100 UNIT/ML injection 3 (three) times daily before meals. 6 units with breakfast, 5 with lunch, and 7 with evening meal      . insulin aspart (NOVOLOG) 100 UNIT/ML injection SSI before meals and at bedtime       . insulin glargine (LANTUS) 100 UNIT/ML injection Inject 14 Units into the skin at bedtime.       . isosorbide mononitrate (IMDUR) 30 MG 24 hr tablet Take 30 mg by mouth daily. 1/2 daily       . lactulose (CHRONULAC) 10 GM/15ML solution 30 mls by mouth daily- hold for diarrhea       . lidocaine (LIDODERM) 5 % Place 1 patch onto the skin daily. Remove & Discard patch within 12 hours or as directed by MD       .  loratadine (CLARITIN) 10 MG tablet Take 10 mg by mouth daily as needed.        Marland Kitchen LORazepam (ATIVAN) 0.5 MG tablet Take 0.5 mg by mouth 2 (two) times daily as needed.        Marland Kitchen omeprazole (PRILOSEC) 20 MG capsule Take 20 mg by mouth daily.        . Polyvinyl Alcohol (AKWA TEARS OP) Apply 2 drops to eye 4 (four) times daily.       . potassium chloride SA (K-DUR,KLOR-CON) 20 MEQ tablet Take 20 mEq by mouth 4 (four) times daily. Not taking while on Bactrim       . promethazine (PHENERGAN) 12.5 MG tablet Take 12.5 mg by mouth every 6 (six) hours as needed.        . rosuvastatin (CRESTOR) 20 MG tablet Take 20 mg by mouth daily.        . sennosides-docusate sodium (SENOKOT-S) 8.6-50 MG tablet Take 1 tablet by mouth at bedtime.        Marland Kitchen spironolactone (ALDACTONE) 25 MG tablet Take 25 mg by mouth daily.       . Sulfamethoxazole-Trimethoprim (BACTRIM DS PO) Take by mouth.        . tiotropium (SPIRIVA) 18 MCG inhalation  capsule Place 18 mcg into inhaler and inhale daily.        Marland Kitchen torsemide (DEMADEX) 20 MG tablet Take 2 tablets two times daily      . traMADol (ULTRAM) 50 MG tablet Take 50 mg by mouth every 6 (six) hours as needed.          Allergies  Allergen Reactions  . Codeine     REACTION: unspecified  . Doxycycline   . Guaifenesin & Derivatives   . Penicillins     REACTION: unspecified    Review of Systems negative except from HPI and PMH  Physical Exam Well developed and well nourished obese female sitting in wheel chair wearin oxygen in no acute distress HENT normal E scleral and icterus clear Neck Supple Clear to ausculation but marked decrease air movement Regular rate and rhythm, no murmurs gallops or rub Soft with active bowel sounds No clubbing cyanosis and edema Alert and oriented, grossly normal motor and sensory function Skin Warm and Dry rash of anterior chest   Assessment and  Plan

## 2011-02-16 NOTE — Assessment & Plan Note (Signed)
Stable

## 2011-02-16 NOTE — Assessment & Plan Note (Signed)
Stable post pacer 

## 2011-02-16 NOTE — Assessment & Plan Note (Signed)
The patient's device was interrogated.  The information was reviewed. No changes were made in the programming.    aqpproaching ERi  Will see her agaiin in 1 month

## 2011-03-02 ENCOUNTER — Telehealth: Payer: Self-pay | Admitting: Cardiology

## 2011-03-02 NOTE — Telephone Encounter (Signed)
Pt procedure at [facility] and the need to know what time to be there

## 2011-03-02 NOTE — Telephone Encounter (Signed)
Spoke with nursing staff at home where pt lives and explained to them the pt does not have a procedure scheduled at this time but does have an appointment with the device clinic 7/16 at 2:30pm.  They will advise pt of appointment date and time

## 2011-03-22 ENCOUNTER — Telehealth: Payer: Self-pay | Admitting: Internal Medicine

## 2011-03-22 NOTE — Telephone Encounter (Signed)
I called and spoke with the patient's brother. I explained I cannot release any information to him since the patient has not given [place] permission to speak with him. I explained I would call the patient to see if she gives [place] permission to speak with him, but that she needs to sign a designated party release form. I have attempted to call the patient at her home #. The patient's brother gave a cell phone # for her of [phone], but this is not the same cell # we have on file. I have left a message with [name] at the patient's facility to have her call me.

## 2011-03-22 NOTE — Telephone Encounter (Signed)
Pt brother wants to talk about when is sister's next appt is and how to better help his sister and him communicate what is going on with her because he lives in [facility]

## 2011-03-23 NOTE — Telephone Encounter (Signed)
Late Entry- I spoke with the patient yesterday afternoon and she did give me permission to speak with her brother. I have made the patient aware that when she comes in to the office on 7/16, that she needs to update [place] as to who we can speak with about her. She is agreeable to doing this. I did call her brother back and explain what her appointment on Monday is about.

## 2011-03-25 ENCOUNTER — Encounter: Payer: Self-pay | Admitting: Cardiology

## 2011-03-29 ENCOUNTER — Encounter: Payer: Medicare Other | Admitting: *Deleted

## 2011-03-31 DIAGNOSIS — R55 Syncope and collapse: Secondary | ICD-10-CM

## 2011-04-01 ENCOUNTER — Ambulatory Visit (INDEPENDENT_AMBULATORY_CARE_PROVIDER_SITE_OTHER): Payer: Medicare Other | Admitting: *Deleted

## 2011-04-01 DIAGNOSIS — I495 Sick sinus syndrome: Secondary | ICD-10-CM

## 2011-04-01 DIAGNOSIS — I5032 Chronic diastolic (congestive) heart failure: Secondary | ICD-10-CM

## 2011-04-01 DIAGNOSIS — R55 Syncope and collapse: Secondary | ICD-10-CM

## 2011-04-01 LAB — PACEMAKER DEVICE OBSERVATION
BRDY-0002RV: 65 {beats}/min
RV LEAD THRESHOLD: 0.5 V

## 2011-04-01 NOTE — Progress Notes (Signed)
Pacer checked in device clinic today.

## 2011-04-07 ENCOUNTER — Encounter: Payer: Self-pay | Admitting: Internal Medicine

## 2011-04-07 ENCOUNTER — Ambulatory Visit (INDEPENDENT_AMBULATORY_CARE_PROVIDER_SITE_OTHER): Payer: Medicare Other | Admitting: Internal Medicine

## 2011-04-07 ENCOUNTER — Encounter: Payer: Self-pay | Admitting: *Deleted

## 2011-04-07 DIAGNOSIS — R55 Syncope and collapse: Secondary | ICD-10-CM

## 2011-04-07 DIAGNOSIS — Z950 Presence of cardiac pacemaker: Secondary | ICD-10-CM

## 2011-04-07 DIAGNOSIS — I2510 Atherosclerotic heart disease of native coronary artery without angina pectoris: Secondary | ICD-10-CM

## 2011-04-07 NOTE — Progress Notes (Signed)
HPI  [name] is a 77 y.o. female  seen in followup for syncope for which she underwent pacemaker implantation some time ago. She had no recurrent syncope. Her pacemaker has reached ERI  She has had no syncope or palpitations Echo May 2009  Normal LV function  Catheterization April 2006 demonstrated LAD 40% stenosis.  This proximal stent which was patent with 40-50% stenosis.  First diagonal had ostial 60% stenosis, second diagonal was normal.  The circumflex had luminal irregularities.  There was a ramus intermediate with ostial 60% stenosis, mid obtuse marginalhad luminal irregularities, the right coronary artery is dominant with luminal irregularities.  The patient had PTCA of the LAD in 2002) Past Medical History  Diagnosis Date  . COPD (chronic obstructive pulmonary disease)   . Diastolic congestive heart failure   . C. difficile colitis   . Hypertension   . Hyperlipidemia   . CAD (coronary artery disease)   . Anemia   . Type 2 diabetes mellitus   . Pacemaker 2002  . Cardiac arrest - asystole 2002  . Gallstones   . Pulmonary hypertension   . Renal insufficiency   . Osteoarthritis   . Allergic rhinitis   . Gout   . Stasis dermatitis   . Sinoatrial node dysfunction   . Syncope   . Postsurgical percutaneous transluminal coronary angioplasty status   . Dyslipidemia   . Acute bronchitis   . History of cholelithiasis   . Obesity     Past Surgical History  Procedure Date  . Ptca 2002  . Appendectomy   . Vesicovaginal fistula closure w/ tah   . Pacemaker placement 2002    Current Outpatient Prescriptions  Medication Sig Dispense Refill  . albuterol (PROVENTIL) (2.5 MG/3ML) 0.083% nebulizer solution Take 2.5 mg by nebulization 4 (four) times daily.        Marland Kitchen allopurinol (ZYLOPRIM) 100 MG tablet Take 100 mg by mouth 2 (two) times daily.        Marland Kitchen aspirin 81 MG tablet Take 81 mg by mouth daily.        . budesonide (PULMICORT) 0.25 MG/2ML nebulizer solution Take  0.25 mg by nebulization 4 (four) times daily.        Marland Kitchen dextromethorphan (DELSYM) 30 MG/5ML liquid Take 60 mg by mouth every 12 (twelve) hours as needed.        . diltiazem (DILACOR XR) 240 MG 24 hr capsule Take 240 mg by mouth daily.        . ferrous sulfate 325 (65 FE) MG tablet Take 325 mg by mouth 2 (two) times daily.        . fluocinonide (LIDEX) 0.05 % cream Apply as directed       . insulin aspart (NOVOLOG) 100 UNIT/ML injection as directed. SSI before meals and at bedtime      . insulin glargine (LANTUS) 100 UNIT/ML injection Inject 14 Units into the skin at bedtime.       . isosorbide mononitrate (IMDUR) 30 MG 24 hr tablet Take 30 mg by mouth daily. 1/2 daily       . lactulose (CHRONULAC) 10 GM/15ML solution Take 20 g by mouth 3 (three) times daily.        Marland Kitchen lidocaine (LIDODERM) 5 % Place 1 patch onto the skin daily. Remove & Discard patch within 12 hours or as directed by MD       . loratadine (CLARITIN) 10 MG tablet Take 10 mg by mouth daily as needed.        Marland Kitchen  omeprazole (PRILOSEC) 20 MG capsule Take 20 mg by mouth daily.        . Polyvinyl Alcohol (AKWA TEARS OP) Apply 2 drops to eye 4 (four) times daily.       . rosuvastatin (CRESTOR) 20 MG tablet Take 20 mg by mouth daily.        . sennosides-docusate sodium (SENOKOT-S) 8.6-50 MG tablet Take 1 tablet by mouth at bedtime.        Marland Kitchen spironolactone (ALDACTONE) 25 MG tablet Take 25 mg by mouth daily.       Marland Kitchen tiotropium (SPIRIVA) 18 MCG inhalation capsule Place 18 mcg into inhaler and inhale daily.        Marland Kitchen torsemide (DEMADEX) 20 MG tablet Take 2 tablets two times daily        Allergies  Allergen Reactions  . Codeine     REACTION: unspecified  . Doxycycline   . Guaifenesin & Derivatives   . Penicillins     REACTION: unspecified    Review of Systems negative except from HPI and PMH  Physical Exam Well developed and well nourished obese female sitting in wheel chair wearin oxygen in no acute distress HENT normal E scleral and  icterus clear Neck Supple Clear to ausculation but marked decrease air movement Regular rate and rhythm, no murmurs gallops or rub Soft with active bowel sounds No clubbing cyanosis and edema Alert and oriented, grossly normal motor and sensory function Skin Warm and Dry rash of anterior chest   Assessment and  Plan

## 2011-04-07 NOTE — Patient Instructions (Signed)
Your physician has recommended that you have a pacemaker generator change. Please see the instruction sheet given to you today for more information.    

## 2011-04-07 NOTE — Assessment & Plan Note (Signed)
Stable without chest pain 

## 2011-04-07 NOTE — Assessment & Plan Note (Signed)
Her device has reached ER eye. She'll need to undergo generator replacement. The benefits and risks were reviewed including but not limited to death and  infection.  The patient understands agrees and is willing to proceed.

## 2011-04-07 NOTE — Assessment & Plan Note (Signed)
No recurrent syncope 

## 2011-04-27 ENCOUNTER — Other Ambulatory Visit (INDEPENDENT_AMBULATORY_CARE_PROVIDER_SITE_OTHER): Payer: Medicare Other | Admitting: *Deleted

## 2011-04-27 ENCOUNTER — Telehealth: Payer: Self-pay | Admitting: *Deleted

## 2011-04-27 DIAGNOSIS — R55 Syncope and collapse: Secondary | ICD-10-CM

## 2011-04-27 DIAGNOSIS — I2510 Atherosclerotic heart disease of native coronary artery without angina pectoris: Secondary | ICD-10-CM

## 2011-04-27 DIAGNOSIS — Z950 Presence of cardiac pacemaker: Secondary | ICD-10-CM

## 2011-04-27 LAB — CBC WITH DIFFERENTIAL/PLATELET
Basophils Relative: 0.5 % (ref 0.0–3.0)
Eosinophils Absolute: 0.8 10*3/uL — ABNORMAL HIGH (ref 0.0–0.7)
Eosinophils Relative: 7.7 % — ABNORMAL HIGH (ref 0.0–5.0)
HCT: 32.7 % — ABNORMAL LOW (ref 36.0–46.0)
Lymphs Abs: 2.2 10*3/uL (ref 0.7–4.0)
MCHC: 32.5 g/dL (ref 30.0–36.0)
MCV: 84.5 fl (ref 78.0–100.0)
Monocytes Absolute: 0.6 10*3/uL (ref 0.1–1.0)
Neutrophils Relative %: 62.9 % (ref 43.0–77.0)
Platelets: 180 10*3/uL (ref 150.0–400.0)
WBC: 9.8 10*3/uL (ref 4.5–10.5)

## 2011-04-27 LAB — BASIC METABOLIC PANEL
BUN: 69 mg/dL — ABNORMAL HIGH (ref 6–23)
CO2: 29 mEq/L (ref 19–32)
Chloride: 100 mEq/L (ref 96–112)
Creatinine, Ser: 2 mg/dL — ABNORMAL HIGH (ref 0.4–1.2)
Potassium: 3.8 mEq/L (ref 3.5–5.1)

## 2011-04-27 LAB — PROTIME-INR: Prothrombin Time: 12.6 s — ABNORMAL HIGH (ref 10.2–12.4)

## 2011-04-27 NOTE — Telephone Encounter (Signed)
I spoke with Hessie Diener nurse at the nursing home where pt is a resident. She is aware Dr. Graciela Husbands recommended pt decrease torsemide to 20mg  daily. Repeat bmp when pt comes for pacer check.  Pt has appt with pcp on 8/22 will ask Heather if bmp can be drawn then. Mylo Red RN

## 2011-04-28 NOTE — Telephone Encounter (Signed)
Order to be faxed to [facility] for repeat bmp on 05/03/11 when the patient comes in for her generator change.

## 2011-05-03 ENCOUNTER — Ambulatory Visit (HOSPITAL_COMMUNITY)
Admission: RE | Admit: 2011-05-03 | Discharge: 2011-05-03 | Disposition: A | Discharge status: Home / Self Care | Payer: Medicare Other | Source: Ambulatory Visit | Attending: Internal Medicine | Admitting: Internal Medicine

## 2011-05-03 DIAGNOSIS — Z45018 Encounter for adjustment and management of other part of cardiac pacemaker: Secondary | ICD-10-CM | POA: Insufficient documentation

## 2011-05-03 DIAGNOSIS — Z9861 Coronary angioplasty status: Secondary | ICD-10-CM | POA: Insufficient documentation

## 2011-05-03 DIAGNOSIS — I495 Sick sinus syndrome: Secondary | ICD-10-CM

## 2011-05-03 DIAGNOSIS — I2510 Atherosclerotic heart disease of native coronary artery without angina pectoris: Secondary | ICD-10-CM | POA: Insufficient documentation

## 2011-05-03 LAB — BASIC METABOLIC PANEL
BUN: 47 mg/dL — ABNORMAL HIGH (ref 6–23)
Chloride: 104 mEq/L (ref 96–112)
Creatinine, Ser: 1.81 mg/dL — ABNORMAL HIGH (ref 0.50–1.10)
Glucose, Bld: 110 mg/dL — ABNORMAL HIGH (ref 70–99)
Potassium: 4.2 mEq/L (ref 3.5–5.1)

## 2011-05-03 LAB — GLUCOSE, CAPILLARY: Glucose-Capillary: 147 mg/dL — ABNORMAL HIGH (ref 70–99)

## 2011-05-05 ENCOUNTER — Ambulatory Visit: Payer: Medicare Other | Admitting: Endocrinology

## 2011-05-10 NOTE — Op Note (Signed)
  [name], [name]                ACCOUNT NO.:  [id number]  MEDICAL RECORD NO.:  [id number]  LOCATION:  [facility]                         FACILITY:  [facility]  PHYSICIAN:  Duke Salvia, MD, FACCDATE OF BIRTH:  11/11/1927  DATE OF PROCEDURE:  05/03/2011 DATE OF DISCHARGE:  05/03/2011                              OPERATIVE REPORT   SURGEON:  Duke Salvia, MD, [facility]  PREOPERATIVE DIAGNOSES: 1. Previously implanted pacemaker at elective replacement indicator. 2. Syncope with sinus node dysfunction.  POSTOPERATIVE DIAGNOSES: 1. Previously implanted pacemaker at elective replacement indicator. 2. Syncope with sinus node dysfunction.  PROCEDURE:  Explantation of device and implantation of device.  Following obtaining informed consent, the patient was brought to the Electrophysiology Laboratory and placed on the fluoroscopic table in supine position.  After routine prep and drape of the left upper chest, lidocaine was infiltrated along the line of the previous incision and then an incision was married slightly off angle to the prior incision and carried down to layer of the device pocket using sharp dissection with electrocautery.  The pocket was opened.  The device was explanted. The leads were identified and the ventricular lead was marked with a tie.  This turned out to be [id number].  This was different from the information stored in the device that the atrial lead was [id number]. The P-wave was 2.2 with a pace impedance of 457, and threshold of 0.5 at 0.5.  The R-wave was 13.6 with a pace impedance of 434, threshold of 0.8 at 0.5.  The leads were attached to a [employer]onic Adapta, and pulse generator, serial number [id number].  The device was implanted.  The pocket was copiously irrigated with antibiotic containing saline solution.  The leads and the pulse generator were placed in the pocket. Hemostasis having been secured.  The device was secured to the prepectoral fascia.  The  wound was then closed in three layers in a normal fashion.  The wound was washed and dried, and a benzoin and Steri- Strip dressing were applied.  Needle counts, sponge counts, and instrument counts were correct at the end of the procedure according to staff.  The patient tolerated the procedure without apparent complication.     Duke Salvia, MD, [name]     SCK/MEDQ  D:  05/03/2011  T:  05/03/2011  Job:  [id number]  Electronically Signed by Sherryl Manges MD [facility] on 05/10/2011 01:54:25 PM

## 2011-05-12 ENCOUNTER — Encounter: Payer: Self-pay | Admitting: Internal Medicine

## 2011-05-12 ENCOUNTER — Ambulatory Visit (INDEPENDENT_AMBULATORY_CARE_PROVIDER_SITE_OTHER): Payer: Medicare Other | Admitting: *Deleted

## 2011-05-12 DIAGNOSIS — R55 Syncope and collapse: Secondary | ICD-10-CM

## 2011-05-12 DIAGNOSIS — I495 Sick sinus syndrome: Secondary | ICD-10-CM

## 2011-05-12 DIAGNOSIS — I10 Essential (primary) hypertension: Secondary | ICD-10-CM

## 2011-05-12 LAB — PACEMAKER DEVICE OBSERVATION
AL AMPLITUDE: 2 mv
AL IMPEDENCE PM: 442 Ohm
AL THRESHOLD: 0.75 v
ATRIAL PACING PM: 15
BAMS-0001: 150 {beats}/min
BATTERY VOLTAGE: 2.8 v
RV LEAD AMPLITUDE: 11.2 mv
RV LEAD IMPEDENCE PM: 418 Ohm
RV LEAD THRESHOLD: 0.75 v
VENTRICULAR PACING PM: 0

## 2011-05-12 LAB — BASIC METABOLIC PANEL
BUN: 50 mg/dL — ABNORMAL HIGH (ref 6–23)
CO2: 30 mEq/L (ref 19–32)
Chloride: 106 mEq/L (ref 96–112)
Creatinine, Ser: 1.8 mg/dL — ABNORMAL HIGH (ref 0.4–1.2)
Glucose, Bld: 121 mg/dL — ABNORMAL HIGH (ref 70–99)

## 2011-05-12 NOTE — Progress Notes (Signed)
Pacer check in clinic  

## 2011-05-21 ENCOUNTER — Ambulatory Visit (INDEPENDENT_AMBULATORY_CARE_PROVIDER_SITE_OTHER): Payer: Medicare Other | Admitting: Endocrinology

## 2011-05-21 ENCOUNTER — Other Ambulatory Visit (INDEPENDENT_AMBULATORY_CARE_PROVIDER_SITE_OTHER): Payer: Medicare Other

## 2011-05-21 ENCOUNTER — Encounter: Payer: Self-pay | Admitting: Endocrinology

## 2011-05-21 VITALS — BP 108/82 | HR 79 | Temp 98.0°F | Ht 60.0 in | Wt 177.4 lb

## 2011-05-21 DIAGNOSIS — Z780 Asymptomatic menopausal state: Secondary | ICD-10-CM | POA: Insufficient documentation

## 2011-05-21 DIAGNOSIS — N259 Disorder resulting from impaired renal tubular function, unspecified: Secondary | ICD-10-CM

## 2011-05-21 DIAGNOSIS — I509 Heart failure, unspecified: Secondary | ICD-10-CM

## 2011-05-21 DIAGNOSIS — E119 Type 2 diabetes mellitus without complications: Secondary | ICD-10-CM

## 2011-05-21 DIAGNOSIS — I5032 Chronic diastolic (congestive) heart failure: Secondary | ICD-10-CM

## 2011-05-21 DIAGNOSIS — Z79899 Other long term (current) drug therapy: Secondary | ICD-10-CM | POA: Insufficient documentation

## 2011-05-21 DIAGNOSIS — D649 Anemia, unspecified: Secondary | ICD-10-CM

## 2011-05-21 DIAGNOSIS — Z0000 Encounter for general adult medical examination without abnormal findings: Secondary | ICD-10-CM

## 2011-05-21 DIAGNOSIS — E785 Hyperlipidemia, unspecified: Secondary | ICD-10-CM

## 2011-05-21 DIAGNOSIS — M109 Gout, unspecified: Secondary | ICD-10-CM

## 2011-05-21 DIAGNOSIS — I10 Essential (primary) hypertension: Secondary | ICD-10-CM

## 2011-05-21 LAB — URIC ACID: Uric Acid, Serum: 4.2 mg/dL (ref 2.4–7.0)

## 2011-05-21 LAB — TSH: TSH: 1.88 u[IU]/mL (ref 0.35–5.50)

## 2011-05-21 LAB — BASIC METABOLIC PANEL
Calcium: 9.3 mg/dL (ref 8.4–10.5)
Creatinine, Ser: 1.7 mg/dL — ABNORMAL HIGH (ref 0.4–1.2)
GFR: 31.49 mL/min — ABNORMAL LOW (ref >60.00)

## 2011-05-21 LAB — CBC WITH DIFFERENTIAL/PLATELET
Basophils Absolute: 0 10*3/uL (ref 0.0–0.1)
Hemoglobin: 9.5 g/dL — ABNORMAL LOW (ref 12.0–15.0)
Lymphocytes Relative: 20.8 % (ref 12.0–46.0)
Monocytes Relative: 7.3 % (ref 3.0–12.0)
Neutro Abs: 6.8 10*3/uL (ref 1.4–7.7)
Platelets: 183 10*3/uL (ref 150.0–400.0)
RDW: 16.5 % — ABNORMAL HIGH (ref 11.5–14.6)

## 2011-05-21 LAB — IBC PANEL
Saturation Ratios: 9.8 % — ABNORMAL LOW (ref 20.0–50.0)
Transferrin: 233.3 mg/dL (ref 212.0–360.0)

## 2011-05-21 LAB — MICROALBUMIN / CREATININE URINE RATIO
Creatinine,U: 65.8 mg/dL
Microalb Creat Ratio: 5 mg/g (ref 0.0–30.0)
Microalb, Ur: 3.3 mg/dL — ABNORMAL HIGH (ref 0.0–1.9)

## 2011-05-21 LAB — HEMOGLOBIN A1C: Hgb A1c MFr Bld: 6.4 % (ref 4.6–6.5)

## 2011-05-21 LAB — HEPATIC FUNCTION PANEL
AST: 18 U/L (ref 0–37)
Total Bilirubin: 0.3 mg/dL (ref 0.3–1.2)

## 2011-05-21 LAB — VITAMIN B12: Vitamin B-12: 429 pg/mL (ref 211–911)

## 2011-05-21 NOTE — Patient Instructions (Addendum)
blood tests are being ordered for you today.  i'll forward the results to your facility.  pending the test results, please: Continue novolog 5 units with breakfast, 5 with lunch, and 7 with evening meal. Same lantus, and same prn novolog.   check your blood sugar 4 times a day--before the 3 meals, and at bedtime.  also check if you have symptoms of your blood sugar being too high or too low.  please keep a record of the readings and bring it to your next appointment here.  please call us sooner if you are having low blood sugar episodes. good diet and exercise habits significanly improve the control of your diabetes.  please let me know if you wish to be referred to a dietician.  high blood sugar is very risky to your health.  you should see an eye doctor every year, who should check for diabetes problems, vision, and glaucoma.  Please ask that this dr send [place] a report here. controlling your blood pressure and cholesterol drastically reduces the damage diabetes does to your body.  this also applies to quitting smoking.  please discuss these with your doctor.  you should take an aspirin every day, unless you have been advised by a doctor not to. reduce lantus to 12 units qhs. please consider these measures for your health:  minimize alcohol.  do not use tobacco products.  have a colonoscopy at least every 10 years from age 78.  Women should have an annual mammogram from age 50.  keep firearms safely stored.  always use seat belts.  have working smoke alarms in your home.  see an eye doctor and dentist regularly.  never drive under the influence of alcohol or drugs (including prescription drugs).  those with fair skin should take precautions against the sun. please let me know what your wishes would be, if artificial life support measures should become necessary.  it is critically important to prevent falling down (keep floor areas well-lit, dry, and free of loose objects) You should have a vaccine against  shingles (a painful rash which results from the  chickenpox infection which most people had many years ago).  This vaccine reduces, but does not totally eliminate the risk of shingles.  Because this is a medicare part d benefit, there are 3 ways you can get it:  You can go to a pharmacy and get the injection (I can give you a prescription), or I can give you a prescription to have filled at a pharmacy, and bring back here for [place] to give.  The other option is that you can pay up-front for it, and we'll give you a form to make a claim for reimbursement from your medicare part d carrier.  You may also be able to get this at [facility].  Please inquire.   Let's recheck a bone-density test.  Please call to schedule a mammogram.

## 2011-05-21 NOTE — Progress Notes (Signed)
Subjective:    Patient ID: [name], female    DOB: [birth year], 77 y.o.   MRN: [id number]  HPI Subjective:   Patient here for Medicare annual wellness visit and management of other chronic and acute problems.     Risk factors: advanced age    Roster of Physicians Providing Medical Care to Patient: Cardiol: hochrein Ep: klein Pulm: wright Opthal: pt does not know, but this dr comes to [facility]. Derm: jones   Activities of Daily Living: In your present state of health, do you have any difficulty performing the following activities?:  Preparing food and eating?: (meals are brought to pt) Bathing yourself: pt does assisted Getting dressed:  No  Using the toilet: No  Moving around from place to place: (uses wheelchair) In the past year have you fallen or had a near fall?: No    Home Safety: [facility] has smoke detectors and pt wears seat belts. No firearms. No excess sun exposure.  Diet and Exercise  Current exercise habits: limited by med probs. Dietary issues discussed: pt reports a healthy diet is delivered to her at [facility]   Depression Screen  Q1: Over the past two weeks, have you felt down, depressed or hopeless? no  Q2: Over the past two weeks, have you felt little interest or pleasure in doing things? no   The following portions of the patient's history were reviewed and updated as appropriate: allergies, current medications, past family history, past medical history, past social history, past surgical history and problem list.  Past Medical History  Diagnosis Date  . COPD (chronic obstructive pulmonary disease)   . Diastolic congestive heart failure   . C. difficile colitis   . Hypertension   . Hyperlipidemia   . CAD (coronary artery disease)   . Anemia   . Type 2 diabetes mellitus   . Pacemaker 2002  . Cardiac arrest - asystole 2002  . Gallstones   . Pulmonary hypertension   . Renal insufficiency   . Osteoarthritis   . Allergic rhinitis   .  Gout   . Stasis dermatitis   . Sinoatrial node dysfunction   . Syncope   . Postsurgical percutaneous transluminal coronary angioplasty status   . Dyslipidemia   . Acute bronchitis   . History of cholelithiasis   . Obesity     Past Surgical History  Procedure Date  . Ptca 2002  . Appendectomy   . Vesicovaginal fistula closure w/ tah   . Pacemaker placement 2002    History   Social History  . Marital Status: Widowed    Spouse Name: N/A    Number of Children: 1  . Years of Education: N/A   Occupational History  . Retired     Hospital doctor  .     Social History Main Topics  . Smoking status: Former Smoker -- 2.0 packs/day for 25 years    Types: Cigarettes    Quit date: 09/13/1985  . Smokeless tobacco: Never Used  . Alcohol Use: No  . Drug Use: Not on file  . Sexually Active: Not on file   Other Topics Concern  . Not on file   Social History Narrative   Resides at [facility]    Current Outpatient Prescriptions on File Prior to Visit  Medication Sig Dispense Refill  . albuterol (PROVENTIL) (2.5 MG/3ML) 0.083% nebulizer solution Take 2.5 mg by nebulization 4 (four) times daily.        Marland Kitchen allopurinol (ZYLOPRIM) 100  MG tablet Take 100 mg by mouth 2 (two) times daily.        Marland Kitchen aspirin 81 MG tablet Take 81 mg by mouth daily.        Marland Kitchen atenolol (TENORMIN) 50 MG tablet Take 50 mg by mouth daily.        . budesonide (PULMICORT) 0.25 MG/2ML nebulizer solution Take 0.25 mg by nebulization 4 (four) times daily.        . ferrous sulfate 325 (65 FE) MG tablet Take 325 mg by mouth 2 (two) times daily.        . fluocinonide (LIDEX) 0.05 % cream Apply as directed       . insulin aspart (NOVOLOG) 100 UNIT/ML injection as directed. SSI before meals and at bedtime      . insulin glargine (LANTUS) 100 UNIT/ML injection Inject 12 Units into the skin at bedtime.       . isosorbide mononitrate (IMDUR) 30 MG 24 hr tablet 1/2 daily      . lactulose (CHRONULAC) 10 GM/15ML solution Take  20 g by mouth 3 (three) times daily.        Marland Kitchen lidocaine (LIDODERM) 5 % Place 1 patch onto the skin daily. Remove & Discard patch within 12 hours or as directed by MD       . loratadine (CLARITIN) 10 MG tablet Take 10 mg by mouth daily as needed.        Marland Kitchen omeprazole (PRILOSEC) 20 MG capsule Take 20 mg by mouth daily.        . Polyvinyl Alcohol (AKWA TEARS OP) Apply 2 drops to eye 4 (four) times daily.       . rosuvastatin (CRESTOR) 20 MG tablet Take 20 mg by mouth daily.        . sennosides-docusate sodium (SENOKOT-S) 8.6-50 MG tablet Take 1 tablet by mouth at bedtime.        Marland Kitchen spironolactone (ALDACTONE) 25 MG tablet Take 25 mg by mouth daily.       Marland Kitchen tiotropium (SPIRIVA) 18 MCG inhalation capsule Place 18 mcg into inhaler and inhale daily.        Marland Kitchen torsemide (DEMADEX) 20 MG tablet Take 10 mg by mouth daily.       Marland Kitchen dextromethorphan (DELSYM) 30 MG/5ML liquid Take 60 mg by mouth every 12 (twelve) hours as needed.          Allergies  Allergen Reactions  . Codeine     REACTION: unspecified  . Doxycycline   . Guaifenesin & Derivatives   . Penicillins     REACTION: unspecified    Family History  Problem Relation Age of Onset  . Diabetes Mother   . Hypertension Mother   . Heart disease Father     BP 108/82  Pulse 79  Temp(Src) 98 F (36.7 C) (Oral)  Ht 5' (1.524 m)  Wt 177 lb 6.4 oz (80.468 kg)  BMI 34.65 kg/m2  SpO2 97%   Review of Systems  Denies hearing loss, and visual loss Objective:   Vision:  Sees opthalmologist Hearing: grossly normal Body mass index:  See vs page Msk: pt performs "get-up-and-go" from a sitting position, only with assistance Cognitive Impairment Assessment: cognition, memory and judgment appear normal.  remembers 3/3 at 5 minutes.  excellent recall.  Pt reports she can easily read and write a sentence, except for her oa of the hands.  alert and oriented x 3   Assessment:   Medicare wellness utd on preventive parameters  Plan:   During the  course of the visit the patient was educated and counseled about appropriate screening and preventive services including:        Fall prevention   Screening mammography  Bone densitometry screening  Diabetes screening  Nutrition counseling   Vaccines / LABS Zostavax / Pnemonccoal Vaccine  today  PSA  Patient Instructions (the written plan) was given to the patient.        Review of Systems     Objective:   Physical Exam        Assessment & Plan:     SEPARATE EVALUATION FOLLOWS-- HISTORY OF THE PRESENT ILLNESS: Pt has had mild hypoglycemia, in the early hrs of the am.   pt states few weeks of slight increase in her chronic edema.  No assoc chest pain. Pt has h/o anemia.  She had colonoscopy a few years ago PAST MEDICAL HISTORY reviewed and up to date today REVIEW OF SYSTEMS: Denies loc and weight change.  No change in chronic sob. PHYSICAL EXAMINATION: VITAL SIGNS:  See vs page GENERAL: no distress.  Obese.  In wheelchair Pulses: dorsalis pedis intact bilat.   Feet: no deformity.  no ulcer on the feet.  feet are of normal color and temp.  1+ bilat leg edema.  There is bilteral onychomycosis Neuro: sensation is intact to touch on the feet LAB/XRAY RESULTS: bnp is elev Cbc: anemia Fe: low A1c=6.4 IMPRESSION: chf is slightly worse Dm, still overcontrolled fe-deficiency anemia, persistent PLAN: See instruction page, and lab results page

## 2011-05-24 ENCOUNTER — Encounter: Payer: Self-pay | Admitting: *Deleted

## 2011-05-24 ENCOUNTER — Inpatient Hospital Stay: Admission: RE | Admit: 2011-05-24 | Payer: Medicare Other | Source: Ambulatory Visit

## 2011-05-24 LAB — PTH, INTACT AND CALCIUM: PTH: 71.2 pg/mL (ref 14.0–72.0)

## 2011-05-31 ENCOUNTER — Other Ambulatory Visit: Payer: Medicare Other

## 2011-06-01 ENCOUNTER — Ambulatory Visit: Payer: Medicare Other | Admitting: Internal Medicine

## 2011-06-09 ENCOUNTER — Inpatient Hospital Stay: Admission: RE | Admit: 2011-06-09 | Payer: Medicare Other | Source: Ambulatory Visit

## 2011-06-09 LAB — BLOOD GAS, ARTERIAL
Acid-base deficit: 2.7 — ABNORMAL HIGH
FIO2: 0.21
O2 Saturation: 96
Patient temperature: 98.6

## 2011-06-09 LAB — POCT CARDIAC MARKERS
CKMB, poc: 1.4
Myoglobin, poc: 61
Myoglobin, poc: 64.8
Myoglobin, poc: 65.3
Operator id: 272551
Operator id: 272551
Operator id: 277751
Troponin i, poc: <0.05

## 2011-06-09 LAB — URINALYSIS, ROUTINE W REFLEX MICROSCOPIC
Glucose, UA: NEGATIVE
Glucose, UA: NEGATIVE
Ketones, ur: NEGATIVE
Leukocytes, UA: NEGATIVE
Protein, ur: 100 — AB
Protein, ur: NEGATIVE
Specific Gravity, Urine: 1.008
pH: 5.5

## 2011-06-09 LAB — COMPREHENSIVE METABOLIC PANEL
ALT: 13
ALT: 21
ALT: 21
AST: 23
Albumin: 3 — ABNORMAL LOW
Albumin: 3.2 — ABNORMAL LOW
Alkaline Phosphatase: 57
Alkaline Phosphatase: 65
BUN: 26 — ABNORMAL HIGH
CO2: 26
Calcium: 9.2
Chloride: 103
Chloride: 106
Creatinine, Ser: 1.68 — ABNORMAL HIGH
GFR calc Af Amer: 36 — ABNORMAL LOW
GFR calc non Af Amer: 26 — ABNORMAL LOW
Glucose, Bld: 153 — ABNORMAL HIGH
Glucose, Bld: 354 — ABNORMAL HIGH
Potassium: 3.6
Potassium: 4.3
Sodium: 139
Sodium: 140
Sodium: 142
Total Bilirubin: 0.4
Total Bilirubin: 0.8
Total Protein: 7

## 2011-06-09 LAB — DIFFERENTIAL
Basophils Absolute: 0
Basophils Relative: 0
Eosinophils Absolute: 0
Eosinophils Relative: 0
Lymphocytes Relative: 31
Lymphs Abs: 3.5
Monocytes Absolute: 1.2 — ABNORMAL HIGH
Monocytes Relative: 6
Neutro Abs: 12.6 — ABNORMAL HIGH
Neutro Abs: 6.4
Neutrophils Relative %: 58

## 2011-06-09 LAB — CBC
HCT: 30.5 — ABNORMAL LOW
HCT: 31.2 — ABNORMAL LOW
HCT: 31.8 — ABNORMAL LOW
Hemoglobin: 10.4 — ABNORMAL LOW
Hemoglobin: 10.4 — ABNORMAL LOW
MCHC: 33.3
MCHC: 33.5
MCHC: 33.6
MCV: 80.9
Platelets: 164
Platelets: 222
Platelets: 252
Platelets: 270
RBC: 3.81 — ABNORMAL LOW
RBC: 4.09
RDW: 14.9
RDW: 15.1
RDW: 15.5
WBC: 12.8 — ABNORMAL HIGH
WBC: 15.4 — ABNORMAL HIGH
WBC: 9.1

## 2011-06-09 LAB — POCT I-STAT 3, ART BLOOD GAS (G3+)
TCO2: 24
pCO2 arterial: 40.9
pH, Arterial: 7.357

## 2011-06-09 LAB — BASIC METABOLIC PANEL
BUN: 48 — ABNORMAL HIGH
BUN: 60 — ABNORMAL HIGH
CO2: 22
Calcium: 8.8
Calcium: 9.1
Creatinine, Ser: 1.86 — ABNORMAL HIGH
GFR calc non Af Amer: 23 — ABNORMAL LOW
GFR calc non Af Amer: 26 — ABNORMAL LOW
Glucose, Bld: 193 — ABNORMAL HIGH
Glucose, Bld: 264 — ABNORMAL HIGH
Potassium: 3 — ABNORMAL LOW
Potassium: 3.5

## 2011-06-09 LAB — CARDIAC PANEL(CRET KIN+CKTOT+MB+TROPI)
CK, MB: 2.8
CK, MB: 3.1
Relative Index: INVALID
Troponin I: 0.01

## 2011-06-09 LAB — URINE MICROSCOPIC-ADD ON

## 2011-06-09 LAB — MAGNESIUM
Magnesium: 2
Magnesium: 2.5

## 2011-06-22 LAB — CBC
HCT: 32.8 — ABNORMAL LOW
HCT: 33.4 — ABNORMAL LOW
Hemoglobin: 11 — ABNORMAL LOW
MCV: 78.4
Platelets: 300
RBC: 4.24
RDW: 16.3 — ABNORMAL HIGH
RDW: 16.3 — ABNORMAL HIGH
WBC: 11.6 — ABNORMAL HIGH

## 2011-06-22 LAB — DIFFERENTIAL
Basophils Absolute: 0.1
Eosinophils Relative: 7 — ABNORMAL HIGH
Lymphocytes Relative: 31
Lymphs Abs: 3.6
Monocytes Absolute: 0.7
Neutro Abs: 6.5

## 2011-06-22 LAB — IRON AND TIBC
Iron: 41 — ABNORMAL LOW
TIBC: 294
UIBC: 253

## 2011-06-22 LAB — RETICULOCYTES: RBC.: 4.32

## 2011-06-22 LAB — LIPID PANEL
Total CHOL/HDL Ratio: 3.1
VLDL: 20

## 2011-06-22 LAB — FOLATE: Folate: 13.6

## 2011-06-22 LAB — BASIC METABOLIC PANEL
BUN: 54 — ABNORMAL HIGH
Calcium: 10
Chloride: 102
Creatinine, Ser: 2.16 — ABNORMAL HIGH
GFR calc Af Amer: 27 — ABNORMAL LOW
GFR calc non Af Amer: 23 — ABNORMAL LOW
Glucose, Bld: 180 — ABNORMAL HIGH
Potassium: 4.3
Potassium: 4.4
Sodium: 139

## 2011-06-22 LAB — FERRITIN: Ferritin: 112 (ref 10–291)

## 2011-06-30 LAB — CBC
HCT: 25 — ABNORMAL LOW
HCT: 26 — ABNORMAL LOW
Hemoglobin: 8.3 — ABNORMAL LOW
Hemoglobin: 8.5 — ABNORMAL LOW
MCHC: 32
MCHC: 32.2
MCV: 76.1 — ABNORMAL LOW
MCV: 76.6 — ABNORMAL LOW
Platelets: 560 — ABNORMAL HIGH
Platelets: 573 — ABNORMAL HIGH
RBC: 3.42 — ABNORMAL LOW
RBC: 3.88
RDW: 20.7 — ABNORMAL HIGH
RDW: 21.7 — ABNORMAL HIGH
WBC: 15.1 — ABNORMAL HIGH
WBC: 15.4 — ABNORMAL HIGH
WBC: 21.5 — ABNORMAL HIGH
WBC: 9.8

## 2011-06-30 LAB — BASIC METABOLIC PANEL
BUN: 23
BUN: 38 — ABNORMAL HIGH
BUN: 44 — ABNORMAL HIGH
BUN: 52 — ABNORMAL HIGH
CO2: 27
CO2: 28
Calcium: 8.4
Calcium: 8.7
Calcium: 8.8
Calcium: 8.8
Chloride: 100
Chloride: 100
Chloride: 103
Chloride: 105
Creatinine, Ser: 1.9 — ABNORMAL HIGH
Creatinine, Ser: 1.94 — ABNORMAL HIGH
Creatinine, Ser: 2.29 — ABNORMAL HIGH
GFR calc Af Amer: 27 — ABNORMAL LOW
GFR calc Af Amer: 31 — ABNORMAL LOW
GFR calc non Af Amer: 22 — ABNORMAL LOW
GFR calc non Af Amer: 24 — ABNORMAL LOW
Glucose, Bld: 215 — ABNORMAL HIGH
Glucose, Bld: 99
Potassium: 3.7
Potassium: 4.2
Sodium: 133 — ABNORMAL LOW
Sodium: 136
Sodium: 137

## 2011-06-30 LAB — CLOSTRIDIUM DIFFICILE EIA: C difficile Toxins A+B, EIA: NEGATIVE

## 2011-06-30 LAB — POCT I-STAT 3, ART BLOOD GAS (G3+)
Bicarbonate: 25.5 — ABNORMAL HIGH
Operator id: 138421
pCO2 arterial: 44.7
pH, Arterial: 7.365
pO2, Arterial: 64 — ABNORMAL LOW

## 2011-06-30 LAB — MAGNESIUM: Magnesium: 2.1

## 2011-06-30 LAB — COMPREHENSIVE METABOLIC PANEL
ALT: 15
Alkaline Phosphatase: 78
CO2: 27
Chloride: 107
Glucose, Bld: 114 — ABNORMAL HIGH
Potassium: 3.6
Sodium: 140
Total Bilirubin: 0.7
Total Protein: 6.6

## 2011-06-30 LAB — TRIGLYCERIDES
Triglycerides: 182 — ABNORMAL HIGH
Triglycerides: 194 — ABNORMAL HIGH
Triglycerides: 209 — ABNORMAL HIGH
Triglycerides: 273 — ABNORMAL HIGH

## 2011-06-30 LAB — PHOSPHORUS: Phosphorus: 5.3 — ABNORMAL HIGH

## 2011-07-01 LAB — CBC
HCT: 23.6 — ABNORMAL LOW
HCT: 25.4 — ABNORMAL LOW
HCT: 29.7 — ABNORMAL LOW
Hemoglobin: 7 — CL
Hemoglobin: 7.3 — CL
Hemoglobin: 7.7 — CL
Hemoglobin: 9.6 — ABNORMAL LOW
MCHC: 31.9
MCHC: 32.1
MCHC: 32.1
MCHC: 32.2
MCHC: 32.5
MCV: 75.3 — ABNORMAL LOW
MCV: 75.4 — ABNORMAL LOW
MCV: 75.9 — ABNORMAL LOW
MCV: 76.1 — ABNORMAL LOW
Platelets: 233
Platelets: 246
Platelets: 259
Platelets: 278
Platelets: 289
Platelets: 313
RBC: 3.92
RDW: 19.5 — ABNORMAL HIGH
RDW: 19.9 — ABNORMAL HIGH
RDW: 20.3 — ABNORMAL HIGH
RDW: 20.5 — ABNORMAL HIGH
RDW: 20.6 — ABNORMAL HIGH
WBC: 19.5 — ABNORMAL HIGH
WBC: 20.2 — ABNORMAL HIGH

## 2011-07-01 LAB — POCT I-STAT 3, ART BLOOD GAS (G3+)
Acid-base deficit: 2
Acid-base deficit: 2
Acid-base deficit: 4 — ABNORMAL HIGH
Bicarbonate: 22.7
O2 Saturation: 100
O2 Saturation: 88
O2 Saturation: 94
O2 Saturation: 94
O2 Saturation: 95
O2 Saturation: 95
Operator id: 277341
Operator id: 280981
Operator id: 281201
Patient temperature: 99
Patient temperature: 99.6
TCO2: 26
TCO2: 27
pCO2 arterial: 32.7 — ABNORMAL LOW
pCO2 arterial: 32.8 — ABNORMAL LOW
pCO2 arterial: 33.7 — ABNORMAL LOW
pCO2 arterial: 36.1
pCO2 arterial: 45.2 — ABNORMAL HIGH
pH, Arterial: 7.287 — ABNORMAL LOW
pH, Arterial: 7.443 — ABNORMAL HIGH
pO2, Arterial: 68 — ABNORMAL LOW
pO2, Arterial: 81

## 2011-07-01 LAB — BASIC METABOLIC PANEL
BUN: 25 — ABNORMAL HIGH
BUN: 29 — ABNORMAL HIGH
BUN: 37 — ABNORMAL HIGH
BUN: 55 — ABNORMAL HIGH
CO2: 24
CO2: 25
Calcium: 8 — ABNORMAL LOW
Calcium: 8 — ABNORMAL LOW
Calcium: 8.3 — ABNORMAL LOW
Chloride: 111
Creatinine, Ser: 1.55 — ABNORMAL HIGH
Creatinine, Ser: 1.73 — ABNORMAL HIGH
Creatinine, Ser: 2.22 — ABNORMAL HIGH
GFR calc Af Amer: 26 — ABNORMAL LOW
GFR calc Af Amer: 35 — ABNORMAL LOW
GFR calc non Af Amer: 21 — ABNORMAL LOW
GFR calc non Af Amer: 29 — ABNORMAL LOW
GFR calc non Af Amer: 31 — ABNORMAL LOW
Glucose, Bld: 107 — ABNORMAL HIGH
Glucose, Bld: 160 — ABNORMAL HIGH
Glucose, Bld: 90
Potassium: 3.1 — ABNORMAL LOW
Sodium: 141
Sodium: 144
Sodium: 147 — ABNORMAL HIGH

## 2011-07-01 LAB — COMPREHENSIVE METABOLIC PANEL
ALT: 11
AST: 23
Albumin: 1.7 — ABNORMAL LOW
CO2: 24
CO2: 26
CO2: 27
Calcium: 8.1 — ABNORMAL LOW
Calcium: 8.4
Calcium: 8.8
Creatinine, Ser: 1.69 — ABNORMAL HIGH
Creatinine, Ser: 1.92 — ABNORMAL HIGH
Creatinine, Ser: 2.44 — ABNORMAL HIGH
GFR calc Af Amer: 36 — ABNORMAL LOW
GFR calc non Af Amer: 19 — ABNORMAL LOW
GFR calc non Af Amer: 25 — ABNORMAL LOW
GFR calc non Af Amer: 29 — ABNORMAL LOW
Glucose, Bld: 207 — ABNORMAL HIGH
Glucose, Bld: 80
Sodium: 147 — ABNORMAL HIGH
Total Bilirubin: 0.5

## 2011-07-01 LAB — DIFFERENTIAL
Basophils Relative: 0
Eosinophils Absolute: 0
Lymphocytes Relative: 3 — ABNORMAL LOW
Lymphs Abs: 0.8
Monocytes Absolute: 1.4 — ABNORMAL HIGH
Neutro Abs: 25.1 — ABNORMAL HIGH

## 2011-07-01 LAB — TYPE AND SCREEN
ABO/RH(D): O POS
ABO/RH(D): O POS
Antibody Screen: NEGATIVE

## 2011-07-01 LAB — CULTURE, BLOOD (ROUTINE X 2)
Culture: NO GROWTH
Culture: NO GROWTH

## 2011-07-01 LAB — TROPONIN I: Troponin I: 0.28 — ABNORMAL HIGH

## 2011-07-01 LAB — URINE MICROSCOPIC-ADD ON

## 2011-07-01 LAB — URINE CULTURE: Colony Count: >=100000

## 2011-07-01 LAB — POCT I-STAT CREATININE: Creatinine, Ser: 2.3 — ABNORMAL HIGH

## 2011-07-01 LAB — PHOSPHORUS
Phosphorus: 2.9
Phosphorus: 3.7
Phosphorus: 4

## 2011-07-01 LAB — CK TOTAL AND CKMB (NOT AT ARMC)
CK, MB: 1.9
Total CK: 211 — ABNORMAL HIGH

## 2011-07-01 LAB — URINALYSIS, ROUTINE W REFLEX MICROSCOPIC
Bilirubin Urine: NEGATIVE
Glucose, UA: NEGATIVE
Ketones, ur: NEGATIVE
Nitrite: NEGATIVE
Protein, ur: 30 — AB
Specific Gravity, Urine: 1.014
Urobilinogen, UA: 0.2
pH: 5.5

## 2011-07-01 LAB — CARDIAC PANEL(CRET KIN+CKTOT+MB+TROPI)
Relative Index: INVALID
Total CK: 268 — ABNORMAL HIGH
Troponin I: 0.26 — ABNORMAL HIGH

## 2011-07-01 LAB — B-NATRIURETIC PEPTIDE (CONVERTED LAB): Pro B Natriuretic peptide (BNP): 348 — ABNORMAL HIGH

## 2011-07-01 LAB — I-STAT 8, (EC8 V) (CONVERTED LAB)
Acid-Base Excess: 2
BUN: 29 — ABNORMAL HIGH
Chloride: 109
HCT: 31 — ABNORMAL LOW
Potassium: 4
pH, Ven: 7.519 — ABNORMAL HIGH

## 2011-07-01 LAB — CLOSTRIDIUM DIFFICILE EIA: C difficile Toxins A+B, EIA: NEGATIVE

## 2011-07-01 LAB — TRIGLYCERIDES: Triglycerides: 310 — ABNORMAL HIGH

## 2011-07-01 LAB — LEGIONELLA ANTIGEN, URINE

## 2011-07-01 LAB — RETICULOCYTES: Retic Ct Pct: 3.5 — ABNORMAL HIGH

## 2011-07-01 LAB — FERRITIN: Ferritin: 73 (ref 10–291)

## 2011-07-01 LAB — DIGOXIN LEVEL: Digoxin Level: 1

## 2011-07-01 LAB — PREPARE RBC (CROSSMATCH)

## 2011-07-01 LAB — APTT: aPTT: 30

## 2011-07-01 LAB — MAGNESIUM: Magnesium: 1.9

## 2011-07-01 LAB — CULTURE, RESPIRATORY W GRAM STAIN: Gram Stain: NONE SEEN

## 2011-07-01 LAB — PROTIME-INR
INR: 1.4
Prothrombin Time: 17.4 — ABNORMAL HIGH

## 2011-07-01 LAB — IRON: Iron: <10 — ABNORMAL LOW

## 2011-07-01 LAB — OCCULT BLOOD X 1 CARD TO LAB, STOOL: Fecal Occult Bld: NEGATIVE

## 2011-07-01 LAB — ABO/RH: ABO/RH(D): O POS

## 2011-07-09 ENCOUNTER — Ambulatory Visit (INDEPENDENT_AMBULATORY_CARE_PROVIDER_SITE_OTHER): Payer: Medicare Other | Admitting: Cardiology

## 2011-07-09 ENCOUNTER — Encounter: Payer: Self-pay | Admitting: Cardiology

## 2011-07-09 DIAGNOSIS — I5032 Chronic diastolic (congestive) heart failure: Secondary | ICD-10-CM

## 2011-07-09 DIAGNOSIS — I509 Heart failure, unspecified: Secondary | ICD-10-CM

## 2011-07-09 DIAGNOSIS — I10 Essential (primary) hypertension: Secondary | ICD-10-CM

## 2011-07-09 DIAGNOSIS — R609 Edema, unspecified: Secondary | ICD-10-CM

## 2011-07-09 NOTE — Assessment & Plan Note (Addendum)
BP Readings from Last 3 Encounters:  07/09/11 152/64  05/21/11 108/82  04/07/11 150/76   Since her BP is labile as demonstrated I will make no change to her meds.

## 2011-07-09 NOTE — Progress Notes (Signed)
HPI The patient presents for followup of diastolic dysfunction and edema. Since I last saw her she has had her pacemaker generator replaced.  She has been doing relatively well.  She thinks that her ankles are less swollen.  She has had no new PND.  She sleeps in hospital bed with her head at 45 degrees.  She has a mild nonproductive cough.  She denies any new palpitations, chest pain, neck or arm pain.  She does ambulate.  Allergies  Allergen Reactions  . Codeine     REACTION: unspecified  . Doxycycline   . Guaifenesin & Derivatives   . Penicillins     REACTION: unspecified    Current Outpatient Prescriptions  Medication Sig Dispense Refill  . albuterol (PROVENTIL) (2.5 MG/3ML) 0.083% nebulizer solution Take 2.5 mg by nebulization 4 (four) times daily.        Marland Kitchen allopurinol (ZYLOPRIM) 100 MG tablet Take 100 mg by mouth 2 (two) times daily.        Marland Kitchen aspirin 81 MG tablet Take 81 mg by mouth daily.        Marland Kitchen atenolol (TENORMIN) 50 MG tablet Take 50 mg by mouth daily.        . budesonide (PULMICORT) 0.25 MG/2ML nebulizer solution Take 0.25 mg by nebulization 4 (four) times daily.        Marland Kitchen dextromethorphan (DELSYM) 30 MG/5ML liquid Take 60 mg by mouth every 12 (twelve) hours as needed.        . ferrous sulfate 325 (65 FE) MG tablet Take 325 mg by mouth 2 (two) times daily.        . fluocinonide (LIDEX) 0.05 % cream Apply as directed       . insulin aspart (NOVOLOG) 100 UNIT/ML injection as directed. SSI before meals and at bedtime      . insulin glargine (LANTUS) 100 UNIT/ML injection Inject 12 Units into the skin at bedtime.       . isosorbide mononitrate (IMDUR) 30 MG 24 hr tablet 1/2 daily      . lactulose (CHRONULAC) 10 GM/15ML solution Take 20 g by mouth 3 (three) times daily.        Marland Kitchen lidocaine (LIDODERM) 5 % Place 1 patch onto the skin daily. Remove & Discard patch within 12 hours or as directed by MD       . loratadine (CLARITIN) 10 MG tablet Take 10 mg by mouth daily as needed.          Marland Kitchen omeprazole (PRILOSEC) 20 MG capsule Take 20 mg by mouth daily.        . Polyvinyl Alcohol (AKWA TEARS OP) Apply 2 drops to eye 4 (four) times daily.       . rosuvastatin (CRESTOR) 20 MG tablet Take 20 mg by mouth daily.        . sennosides-docusate sodium (SENOKOT-S) 8.6-50 MG tablet Take 1 tablet by mouth at bedtime.        Marland Kitchen spironolactone (ALDACTONE) 25 MG tablet Take 25 mg by mouth daily.       Marland Kitchen tiotropium (SPIRIVA) 18 MCG inhalation capsule Place 18 mcg into inhaler and inhale daily.        Marland Kitchen torsemide (DEMADEX) 20 MG tablet Take 20 mg by mouth daily.         Past Medical History  Diagnosis Date  . COPD (chronic obstructive pulmonary disease)   . Diastolic congestive heart failure   . C. difficile colitis   . Hypertension   .  Hyperlipidemia   . CAD (coronary artery disease)   . Anemia   . Type 2 diabetes mellitus   . Pacemaker 2002  . Cardiac arrest - asystole 2002  . Gallstones   . Pulmonary hypertension   . Renal insufficiency   . Osteoarthritis   . Allergic rhinitis   . Gout   . Stasis dermatitis   . Sinoatrial node dysfunction   . Syncope   . Postsurgical percutaneous transluminal coronary angioplasty status   . Dyslipidemia   . Acute bronchitis   . History of cholelithiasis   . Obesity     Past Surgical History  Procedure Date  . Ptca 2002  . Appendectomy   . Vesicovaginal fistula closure w/ tah   . Pacemaker placement 2002    ROS: As stated in the HPI and negative for all other systems.   PHYSICAL EXAM BP 152/64  Pulse 65  Ht 5' (1.524 m)  Wt 172 lb (78.019 kg)  BMI 33.59 kg/m2 PHYSICAL EXAM GEN:  No distress NECK:  No jugular venous distention at 90 degrees, waveform within normal limits, carotid upstroke brisk and symmetric, no bruits, no thyromegaly LYMPHATICS:  No cervical adenopathy LUNGS:  Expiratory wheezing BACK:  No CVA tenderness CHEST:  Unremarkable HEART:  S1 and S2 within normal limits, no S3, no S4, no clicks, no rubs, no  murmurs ABD:  Positive bowel sounds normal in frequency in pitch, no bruits, no rebound, no guarding, unable to assess midline mass or bruit with the patient seated. EXT:  2 plus pulses throughout, moderate edema improved, no cyanosis no clubbing, with chronic venous stasis. SKIN:  No rashes no nodules NEURO:  Cranial nerves II through XII grossly intact, motor grossly intact throughout PSYCH:  Cognitively intact, oriented to person place and time  ASSESSMENT AND PLAN

## 2011-07-09 NOTE — Patient Instructions (Signed)
The current medical regimen is effective;  continue present plan and medications.  Follow up in 4 months with Dr Hochrein.  You will receive a letter in the mail 2 months before you are due.  Please call us when you receive this letter to schedule your follow up appointment.  

## 2011-07-09 NOTE — Assessment & Plan Note (Signed)
We will continue conservative therapy.  No change is indicated.

## 2011-07-09 NOTE — Assessment & Plan Note (Signed)
She seems to be euvolemic.  At this point, no change in therapy is indicated.  We have reviewed salt and fluid restrictions.  No further cardiovascular testing is indicated.   

## 2011-08-11 NOTE — Telephone Encounter (Signed)
Hey pam, schedulers are going through open encounters and are to send all messages back to the nurses that are still open and ask them to close them, can you close this one? Thanks

## 2011-08-13 ENCOUNTER — Telehealth: Payer: Self-pay | Admitting: Critical Care Medicine

## 2011-08-13 ENCOUNTER — Ambulatory Visit (INDEPENDENT_AMBULATORY_CARE_PROVIDER_SITE_OTHER): Payer: Medicare Other | Admitting: Critical Care Medicine

## 2011-08-13 ENCOUNTER — Encounter: Payer: Self-pay | Admitting: Critical Care Medicine

## 2011-08-13 DIAGNOSIS — J441 Chronic obstructive pulmonary disease with (acute) exacerbation: Secondary | ICD-10-CM

## 2011-08-13 MED ORDER — PREDNISONE 10 MG PO TABS: ORAL_TABLET | ORAL | Status: DC

## 2011-08-13 NOTE — Patient Instructions (Signed)
Prednisone 10mg  Take 4 for two days three for two days two for two days one for two days  Prescription sent to the facility  No other medication changes Return 4 months

## 2011-08-13 NOTE — Telephone Encounter (Signed)
Called and spoke with [name], nurse on pt's floor.  She states they were just needing clarification on pt's pred taper that was given to pt by PW today at ov.  Re-read pred taper instructions to [name] and clarified these instructions.  [name] verbalized understanding and nothing further was needed.

## 2011-08-13 NOTE — Progress Notes (Signed)
Subjective:    Patient ID: [name], female    DOB: Mar 19, 1928, 77 y.o.   MRN: [id number]  HPI  77 y.o.F quit smoking in 1987 and chronic renal insufficiency with coronary artery disease. The patient had history of congestive heart failure and diabetes. The patient has been hospitalized on numerous occasions for respiratory failure and pneumonia. Patient currently is residing in a nursing care facility.   11/30 Not seen since 5/12.  Now more cough, and mucus.  Pt had spell of diarrhea .  No dx made.   Diarrhea is now better.  Mucus now is white and thick.  Pt notes no real wheeze.   No real chest pain. No edema in feet.    Past Medical History  Diagnosis Date  . COPD (chronic obstructive pulmonary disease)   . Diastolic congestive heart failure   . C. difficile colitis   . Hypertension   . Hyperlipidemia   . CAD (coronary artery disease)   . Anemia   . Type 2 diabetes mellitus   . Pacemaker 2002  . Cardiac arrest - asystole 2002  . Gallstones   . Pulmonary hypertension   . Renal insufficiency   . Osteoarthritis   . Allergic rhinitis   . Gout      Family History  Problem Relation Age of Onset  . Diabetes Mother   . Hypertension Mother   . Heart disease Father      History   Social History  . Marital Status: Widowed    Spouse Name: N/A    Number of Children: N/A  . Years of Education: N/A   Occupational History  . Retired     Hospital doctor   Social History Main Topics  . Smoking status: Former Smoker -- 2.0 packs/day for 25 years    Types: Cigarettes    Quit date: 09/13/1985  . Smokeless tobacco: Never Used  . Alcohol Use: No  . Drug Use: Not on file  . Sexually Active: Not on file   Other Topics Concern  . Not on file   Social History Narrative   Resides at [facility]     Allergies  Allergen Reactions  . Codeine     REACTION: unspecified  . Penicillins     REACTION: unspecified     Outpatient Prescriptions Prior to Visit  Medication  Sig Dispense Refill  . albuterol (PROVENTIL) (2.5 MG/3ML) 0.083% nebulizer solution Take 2.5 mg by nebulization 4 (four) times daily.        Marland Kitchen allopurinol (ZYLOPRIM) 100 MG tablet Take 100 mg by mouth 2 (two) times daily.        Marland Kitchen aspirin 81 MG tablet Take 81 mg by mouth daily.        . budesonide (PULMICORT) 0.25 MG/2ML nebulizer solution Take 0.25 mg by nebulization 4 (four) times daily.        Marland Kitchen dextromethorphan (DELSYM) 30 MG/5ML liquid Take 60 mg by mouth every 12 (twelve) hours as needed.        . diltiazem (DILACOR XR) 240 MG 24 hr capsule Take 240 mg by mouth daily.        . ferrous sulfate 325 (65 FE) MG tablet Take 325 mg by mouth 2 (two) times daily.        . insulin glargine (LANTUS) 100 UNIT/ML injection Inject 15 Units into the skin at bedtime.        Marland Kitchen lactulose (CHRONULAC) 10 GM/15ML solution 30 mls by mouth daily- hold for  diarrhea       . lidocaine (LIDODERM) 5 % Place 1 patch onto the skin daily. Remove & Discard patch within 12 hours or as directed by MD       . loratadine (CLARITIN) 10 MG tablet Take 10 mg by mouth daily as needed.        Marland Kitchen omeprazole (PRILOSEC) 20 MG capsule Take 20 mg by mouth daily.        . Polyvinyl Alcohol (AKWA TEARS OP) Apply 2 drops to eye 4 (four) times daily.       . potassium chloride SA (K-DUR,KLOR-CON) 20 MEQ tablet Take 20 mEq by mouth 4 (four) times daily.        . rosuvastatin (CRESTOR) 20 MG tablet Take 20 mg by mouth daily.        . sennosides-docusate sodium (SENOKOT-S) 8.6-50 MG tablet Take 1 tablet by mouth at bedtime.        Marland Kitchen tiotropium (SPIRIVA) 18 MCG inhalation capsule Place 18 mcg into inhaler and inhale daily.        Marland Kitchen torsemide (DEMADEX) 20 MG tablet Take 2 tablets two times daily      . LORazepam (ATIVAN) 0.5 MG tablet 1/2 to 1 tablet twice a day as needed       . promethazine (PHENERGAN) 25 MG tablet Take 12.5 mg by mouth every 6 (six) hours as needed.        . traMADol (ULTRAM) 50 MG tablet Take 50 mg by mouth every 6 (six)  hours as needed.           Review of Systems Constitutional:   No  weight loss, night sweats,  Fevers, chills, fatigue, lassitude. HEENT:   No headaches,  Difficulty swallowing,  Tooth/dental problems,  Sore throat,                No sneezing, itching, ear ache,  nasal congestion, post nasal drip,   CV:  No chest pain,  Orthopnea, PND,notes  swelling in lower extremities, no anasarca, dizziness, palpitations  GI  No heartburn, indigestion, abdominal pain, nausea, vomiting, diarrhea, change in bowel habits, loss of appetite  Resp: Notes  shortness of breath with exertion and  at rest.  Notes  excess mucus, notes  productive cough,  No non-productive cough,  No coughing up of blood.  Notes  change in color of mucus.  No wheezing.  No chest wall deformity  Skin: no rash or lesions.  GU: no dysuria, change in color of urine, no urgency or frequency.  No flank pain.  MS:  No joint pain or swelling.  No decreased range of motion.  No back pain.  Psych:  No change in mood or affect. No depression or anxiety.  No memory loss.      Objective:    Review of Systems  Constitutional:   No  weight loss, night sweats,  Fevers, chills, fatigue, lassitude. HEENT:   No headaches,  Difficulty swallowing,  Tooth/dental problems,  Sore throat,                No sneezing, itching, ear ache, nasal congestion, post nasal drip,   CV:  No chest pain,  Orthopnea, PND, swelling in lower extremities, anasarca, dizziness, palpitations  GI  No heartburn, indigestion, abdominal pain, nausea, vomiting, diarrhea, change in bowel habits, loss of appetite  Resp: Notes  shortness of breath with exertion not  at rest.  No excess mucus, no productive cough,  No non-productive cough,  No coughing up of blood.  No change in color of mucus.  No wheezing.  No chest wall deformity  Skin: no rash or lesions.  GU: no dysuria, change in color of urine, no urgency or frequency.  No flank pain.  MS:  No joint pain or  swelling.  No decreased range of motion.  No back pain.  Psych:  No change in mood or affect. No depression or anxiety.  No memory loss.     Objective:   Physical Exam  Filed Vitals:   08/13/11 1121  BP: 110/98  Pulse: 59  Temp: 98.6 F (37 C)  TempSrc: Oral  Height: 4\' 11"  (1.499 m)  Weight: 175 lb (79.379 kg)  SpO2: 99%    Gen: Pleasant, well-nourished, in no distress,  normal affect  ENT: No lesions,  mouth clear,  oropharynx clear, no postnasal drip  Neck: No JVD, no TMG, no carotid bruits  Lungs: No use of accessory muscles, no dullness to percussion, distant bs   Cardiovascular: RRR, heart sounds normal, no murmur or gallops, no peripheral edema  Abdomen: soft and NT, no HSM,  BS normal  Musculoskeletal: No deformities, no cyanosis or clubbing  Neuro: alert, non focal  Skin: Warm, no lesions or rashes        Assessment & Plan:   CHRONIC OBSTRUCTIVE PULMONARY DISEASE, ACUTE EXACERBATION Recurrent tracheobronchitis Plan  pulse prednisone     Updated Medication List Outpatient Encounter Prescriptions as of 08/13/2011  Medication Sig Dispense Refill  . albuterol (PROVENTIL) (2.5 MG/3ML) 0.083% nebulizer solution Take 2.5 mg by nebulization 4 (four) times daily.        Marland Kitchen allopurinol (ZYLOPRIM) 100 MG tablet Take 100 mg by mouth 2 (two) times daily.        Marland Kitchen aspirin 81 MG tablet Take 81 mg by mouth daily.        Marland Kitchen atenolol (TENORMIN) 50 MG tablet Take 50 mg by mouth daily.        . budesonide (PULMICORT) 0.25 MG/2ML nebulizer solution Take 0.25 mg by nebulization 4 (four) times daily.        Marland Kitchen dextromethorphan (DELSYM) 30 MG/5ML liquid Take 60 mg by mouth every 12 (twelve) hours as needed.        Marland Kitchen epoetin alfa (EPOGEN,PROCRIT) 09811 UNIT/ML injection Inject 10,000 Units into the skin once a week.        . ferrous sulfate 325 (65 FE) MG tablet Take 325 mg by mouth 2 (two) times daily.        . fluocinonide (LIDEX) 0.05 % cream Apply as directed       .  insulin aspart (NOVOLOG) 100 UNIT/ML injection as directed. SSI before meals and at bedtime      . insulin glargine (LANTUS) 100 UNIT/ML injection Inject 12 Units into the skin at bedtime.       . isosorbide mononitrate (IMDUR) 30 MG 24 hr tablet 1/2 daily      . lidocaine (LIDODERM) 5 % Place 1 patch onto the skin daily. Remove & Discard patch within 12 hours or as directed by MD       . loratadine (CLARITIN) 10 MG tablet Take 10 mg by mouth daily as needed.        Marland Kitchen omeprazole (PRILOSEC) 20 MG capsule Take 20 mg by mouth daily.        . Polyvinyl Alcohol (AKWA TEARS OP) Apply 2 drops to eye 4 (four) times daily.       Marland Kitchen  rosuvastatin (CRESTOR) 20 MG tablet Take 20 mg by mouth daily.        . sennosides-docusate sodium (SENOKOT-S) 8.6-50 MG tablet Take 1 tablet by mouth at bedtime.        Marland Kitchen spironolactone (ALDACTONE) 25 MG tablet Take 25 mg by mouth daily.       Marland Kitchen tiotropium (SPIRIVA) 18 MCG inhalation capsule Place 18 mcg into inhaler and inhale daily.        Marland Kitchen torsemide (DEMADEX) 20 MG tablet Take 20 mg by mouth daily.       . predniSONE (DELTASONE) 10 MG tablet Take 4 for two days three for two days two for two days one for two days   20 tablet  0  . DISCONTD: lactulose (CHRONULAC) 10 GM/15ML solution Take 20 g by mouth 3 (three) times daily.

## 2011-08-14 NOTE — Assessment & Plan Note (Signed)
Recurrent tracheobronchitis Plan  pulse prednisone

## 2011-08-30 ENCOUNTER — Ambulatory Visit (INDEPENDENT_AMBULATORY_CARE_PROVIDER_SITE_OTHER): Payer: Medicare Other | Admitting: Internal Medicine

## 2011-08-30 ENCOUNTER — Encounter: Payer: Self-pay | Admitting: Internal Medicine

## 2011-08-30 DIAGNOSIS — I509 Heart failure, unspecified: Secondary | ICD-10-CM

## 2011-08-30 DIAGNOSIS — Z950 Presence of cardiac pacemaker: Secondary | ICD-10-CM

## 2011-08-30 DIAGNOSIS — I5032 Chronic diastolic (congestive) heart failure: Secondary | ICD-10-CM

## 2011-08-30 DIAGNOSIS — I2510 Atherosclerotic heart disease of native coronary artery without angina pectoris: Secondary | ICD-10-CM

## 2011-08-30 DIAGNOSIS — I495 Sick sinus syndrome: Secondary | ICD-10-CM

## 2011-08-30 LAB — PACEMAKER DEVICE OBSERVATION
AL THRESHOLD: 0.5 V
ATRIAL PACING PM: 28
BAMS-0001: 150 {beats}/min
RV LEAD AMPLITUDE: 16 mv
RV LEAD THRESHOLD: 0.75 V

## 2011-08-30 NOTE — Assessment & Plan Note (Signed)
Stable on current meds 

## 2011-08-30 NOTE — Assessment & Plan Note (Signed)
Continue current meds 

## 2011-08-30 NOTE — Assessment & Plan Note (Signed)
She is paced about 40% of the time

## 2011-08-30 NOTE — Progress Notes (Signed)
HPI  [name] is a 77 y.o. female seen in followup for syncope for which she underwent pacemaker implantation some time ago. She had no recurrent syncope. She denies significant problem with sob and chest pain.  Her ankles are back  And she is so excited She underwent pacemaker generator replacement December 2012 She has had no syncope or palpitations  Echo May 2009 Normal LV function  Catheterization April 2006 demonstrated LAD 40% stenosis. This proximal stent which was patent with 40-50% stenosis. First diagonal had ostial 60% stenosis, second diagonal was normal. The circumflex had luminal irregularities. There was a ramus intermediate with ostial 60% stenosis, mid obtuse marginalhad luminal irregularities, the right coronary artery is dominant with luminal irregularities. The patient had PTCA of the LAD in 2002)   Past Medical History  Diagnosis Date  . COPD (chronic obstructive pulmonary disease)   . Diastolic congestive heart failure   . C. difficile colitis   . Hypertension   . Hyperlipidemia   . CAD (coronary artery disease)   . Anemia   . Type 2 diabetes mellitus   . Pacemaker 2002  . Cardiac arrest - asystole 2002  . Gallstones   . Pulmonary hypertension   . Renal insufficiency   . Osteoarthritis   . Allergic rhinitis   . Gout   . Stasis dermatitis   . Sinoatrial node dysfunction   . Syncope   . Postsurgical percutaneous transluminal coronary angioplasty status   . Dyslipidemia   . Acute bronchitis   . History of cholelithiasis   . Obesity     Past Surgical History  Procedure Date  . Ptca 2002  . Appendectomy   . Vesicovaginal fistula closure w/ tah   . Pacemaker placement 2002    Current Outpatient Prescriptions  Medication Sig Dispense Refill  . albuterol (PROVENTIL) (2.5 MG/3ML) 0.083% nebulizer solution Take 2.5 mg by nebulization 4 (four) times daily.        Marland Kitchen allopurinol (ZYLOPRIM) 100 MG tablet Take 100 mg by mouth 2 (two) times daily.          Marland Kitchen aspirin 81 MG tablet Take 81 mg by mouth daily.        Marland Kitchen atenolol (TENORMIN) 50 MG tablet Take 50 mg by mouth daily.        . budesonide (PULMICORT) 0.25 MG/2ML nebulizer solution Take 0.25 mg by nebulization 4 (four) times daily.       Marland Kitchen dextromethorphan (DELSYM) 30 MG/5ML liquid Take 60 mg by mouth every 12 (twelve) hours as needed.        Marland Kitchen epoetin alfa (EPOGEN,PROCRIT) 13086 UNIT/ML injection Inject 10,000 Units into the skin once a week.        . ferrous sulfate 325 (65 FE) MG tablet Take 325 mg by mouth 2 (two) times daily.        . insulin aspart (NOVOLOG) 100 UNIT/ML injection as directed. SSI before meals and at bedtime      . insulin glargine (LANTUS) 100 UNIT/ML injection Inject 12 Units into the skin at bedtime.       . isosorbide mononitrate (IMDUR) 30 MG 24 hr tablet 1/2 daily      . lidocaine (LIDODERM) 5 % Place 1 patch onto the skin daily. Remove & Discard patch within 12 hours or as directed by MD       . loratadine (CLARITIN) 10 MG tablet Take 10 mg by mouth daily as needed.        Marland Kitchen omeprazole (  PRILOSEC) 20 MG capsule Take 20 mg by mouth daily.        . Polyvinyl Alcohol (AKWA TEARS OP) Apply 2 drops to eye 4 (four) times daily.       . rosuvastatin (CRESTOR) 20 MG tablet Take 20 mg by mouth daily.        Marland Kitchen senna (SENOKOT) 8.6 MG tablet Take 1 tablet by mouth daily.        Marland Kitchen spironolactone (ALDACTONE) 25 MG tablet Take 25 mg by mouth daily.       Marland Kitchen tiotropium (SPIRIVA) 18 MCG inhalation capsule Place 18 mcg into inhaler and inhale daily.        Marland Kitchen torsemide (DEMADEX) 20 MG tablet Take 20 mg by mouth daily.       . sennosides-docusate sodium (SENOKOT-S) 8.6-50 MG tablet Take 1 tablet by mouth at bedtime.          Allergies  Allergen Reactions  . Codeine     REACTION: unspecified  . Doxycycline   . Guaifenesin & Derivatives   . Penicillins     REACTION: unspecified    Review of Systems negative except from HPI and PMH  Physical Exam Well developed and well  nourished in no acute distress HENT normal E scleral and icterus clear Neck Supple JVP flat; carotids brisk and full Clear to ausculation Pacer pocket well healed Regular rate and rhythm, no murmurs gallops or rub Soft with active bowel sounds No clubbing cyanosis Trace Edema Alert and oriented, grossly normal motor and sensory function Skin Warm and Dry   Assessment and  Plan

## 2011-08-30 NOTE — Patient Instructions (Signed)
Your physician wants you to follow-up in: 6 months with the device clinic. You will receive a reminder letter in the mail two months in advance. If you don't receive a letter, please call our office to schedule the follow-up appointment.

## 2011-08-30 NOTE — Assessment & Plan Note (Signed)
The patient's device was interrogated.  The information was reviewed. No changes were made in the programming.    

## 2011-10-12 ENCOUNTER — Encounter: Payer: Self-pay | Admitting: Critical Care Medicine

## 2011-10-12 ENCOUNTER — Ambulatory Visit (INDEPENDENT_AMBULATORY_CARE_PROVIDER_SITE_OTHER)
Admission: RE | Admit: 2011-10-12 | Discharge: 2011-10-12 | Disposition: A | Discharge status: Home / Self Care | Payer: Medicare Other | Source: Ambulatory Visit | Attending: Critical Care Medicine | Admitting: Critical Care Medicine

## 2011-10-12 ENCOUNTER — Ambulatory Visit (INDEPENDENT_AMBULATORY_CARE_PROVIDER_SITE_OTHER): Payer: Medicare Other | Admitting: Critical Care Medicine

## 2011-10-12 VITALS — BP 122/72 | HR 71 | Temp 98.1°F | Ht 59.0 in | Wt 168.8 lb

## 2011-10-12 DIAGNOSIS — J441 Chronic obstructive pulmonary disease with (acute) exacerbation: Secondary | ICD-10-CM

## 2011-10-12 DIAGNOSIS — J189 Pneumonia, unspecified organism: Secondary | ICD-10-CM

## 2011-10-12 MED ORDER — MOXIFLOXACIN HCL 400 MG PO TABS: 400.0000 mg | ORAL_TABLET | Freq: Every day | ORAL | Status: AC

## 2011-10-12 NOTE — Patient Instructions (Signed)
Take Avelox one daily for 5days No other medication changes Return 2 months

## 2011-10-12 NOTE — Progress Notes (Signed)
Subjective:    Patient ID: [name], female    DOB: 11/30/1927, 78 y.o.   MRN: [id number]  HPI  78 y.o.F quit smoking in 1987 and chronic renal insufficiency with coronary artery disease. The patient had history of congestive heart failure and diabetes. The patient has been hospitalized on numerous occasions for respiratory failure and pneumonia. Patient currently is residing in a nursing care facility.   11/30 Not seen since 5/12.  Now more cough, and mucus.  Pt had spell of diarrhea .  No dx made.   Diarrhea is now better.  Mucus now is white and thick.  Pt notes no real wheeze.   No real chest pain. No edema in feet.    10/13/2011 Had influenza around christmas, then developed PNA two weeks ago.  Rx at SNF.   Rx abx? Name.  Since then not any better. Still coughing, still mucus with yellow. Still dyspneic.  No real chest pain.   Notes some wheezing .   Past Medical History  Diagnosis Date  . COPD (chronic obstructive pulmonary disease)   . Diastolic congestive heart failure   . C. difficile colitis   . Hypertension   . Hyperlipidemia   . CAD (coronary artery disease)   . Anemia   . Type 2 diabetes mellitus   . Pacemaker 2002  . Cardiac arrest - asystole 2002  . Gallstones   . Pulmonary hypertension   . Renal insufficiency   . Osteoarthritis   . Allergic rhinitis   . Gout      Family History  Problem Relation Age of Onset  . Diabetes Mother   . Hypertension Mother   . Heart disease Father      History   Social History  . Marital Status: Widowed    Spouse Name: N/A    Number of Children: N/A  . Years of Education: N/A   Occupational History  . Retired     Hospital doctor   Social History Main Topics  . Smoking status: Former Smoker -- 2.0 packs/day for 25 years    Types: Cigarettes    Quit date: 09/13/1985  . Smokeless tobacco: Never Used  . Alcohol Use: No  . Drug Use: Not on file  . Sexually Active: Not on file   Other Topics Concern  . Not on  file   Social History Narrative   Resides at [facility]     Allergies  Allergen Reactions  . Codeine     REACTION: unspecified  . Penicillins     REACTION: unspecified     Outpatient Prescriptions Prior to Visit  Medication Sig Dispense Refill  . albuterol (PROVENTIL) (2.5 MG/3ML) 0.083% nebulizer solution Take 2.5 mg by nebulization 4 (four) times daily.        Marland Kitchen allopurinol (ZYLOPRIM) 100 MG tablet Take 100 mg by mouth 2 (two) times daily.        Marland Kitchen aspirin 81 MG tablet Take 81 mg by mouth daily.        . budesonide (PULMICORT) 0.25 MG/2ML nebulizer solution Take 0.25 mg by nebulization 4 (four) times daily.        Marland Kitchen dextromethorphan (DELSYM) 30 MG/5ML liquid Take 60 mg by mouth every 12 (twelve) hours as needed.        . diltiazem (DILACOR XR) 240 MG 24 hr capsule Take 240 mg by mouth daily.        . ferrous sulfate 325 (65 FE) MG tablet Take 325 mg by  mouth 2 (two) times daily.        . insulin glargine (LANTUS) 100 UNIT/ML injection Inject 15 Units into the skin at bedtime.        Marland Kitchen lactulose (CHRONULAC) 10 GM/15ML solution 30 mls by mouth daily- hold for diarrhea       . lidocaine (LIDODERM) 5 % Place 1 patch onto the skin daily. Remove & Discard patch within 12 hours or as directed by MD       . loratadine (CLARITIN) 10 MG tablet Take 10 mg by mouth daily as needed.        Marland Kitchen omeprazole (PRILOSEC) 20 MG capsule Take 20 mg by mouth daily.        . Polyvinyl Alcohol (AKWA TEARS OP) Apply 2 drops to eye 4 (four) times daily.       . potassium chloride SA (K-DUR,KLOR-CON) 20 MEQ tablet Take 20 mEq by mouth 4 (four) times daily.        . rosuvastatin (CRESTOR) 20 MG tablet Take 20 mg by mouth daily.        . sennosides-docusate sodium (SENOKOT-S) 8.6-50 MG tablet Take 1 tablet by mouth at bedtime.        Marland Kitchen tiotropium (SPIRIVA) 18 MCG inhalation capsule Place 18 mcg into inhaler and inhale daily.        Marland Kitchen torsemide (DEMADEX) 20 MG tablet Take 2 tablets two times daily      .  LORazepam (ATIVAN) 0.5 MG tablet 1/2 to 1 tablet twice a day as needed       . promethazine (PHENERGAN) 25 MG tablet Take 12.5 mg by mouth every 6 (six) hours as needed.        . traMADol (ULTRAM) 50 MG tablet Take 50 mg by mouth every 6 (six) hours as needed.           Review of Systems Constitutional:   No  weight loss, night sweats,  Fevers, chills, fatigue, lassitude. HEENT:   No headaches,  Difficulty swallowing,  Tooth/dental problems,  Sore throat,                No sneezing, itching, ear ache,  nasal congestion, post nasal drip,   CV:  No chest pain,  Orthopnea, PND,notes  swelling in lower extremities, no anasarca, dizziness, palpitations  GI  No heartburn, indigestion, abdominal pain, nausea, vomiting, diarrhea, change in bowel habits, loss of appetite  Resp: Notes  shortness of breath with exertion and  at rest.  Notes  excess mucus, notes  productive cough,  No non-productive cough,  No coughing up of blood.  Notes  change in color of mucus.  No wheezing.  No chest wall deformity  Skin: no rash or lesions.  GU: no dysuria, change in color of urine, no urgency or frequency.  No flank pain.  MS:  No joint pain or swelling.  No decreased range of motion.  No back pain.  Psych:  No change in mood or affect. No depression or anxiety.  No memory loss.      Objective:    Review of Systems  Constitutional:   No  weight loss, night sweats,  Fevers, chills, fatigue, lassitude. HEENT:   No headaches,  Difficulty swallowing,  Tooth/dental problems,  Sore throat,                No sneezing, itching, ear ache, nasal congestion, post nasal drip,   CV:  No chest pain,  Orthopnea, PND, swelling in lower  extremities, anasarca, dizziness, palpitations  GI  No heartburn, indigestion, abdominal pain, nausea, vomiting, diarrhea, change in bowel habits, loss of appetite  Resp: Notes  shortness of breath with exertion not  at rest.  No excess mucus, no productive cough,  No non-productive  cough,  No coughing up of blood.  No change in color of mucus.  No wheezing.  No chest wall deformity  Skin: no rash or lesions.  GU: no dysuria, change in color of urine, no urgency or frequency.  No flank pain.  MS:  No joint pain or swelling.  No decreased range of motion.  No back pain.  Psych:  No change in mood or affect. No depression or anxiety.  No memory loss.     Objective:   Physical Exam  Filed Vitals:   10/12/11 1603  BP: 122/72  Pulse: 71  Temp: 98.1 F (36.7 C)  TempSrc: Oral  Height: 4\' 11"  (1.499 m)  Weight: 168 lb 12.8 oz (76.567 kg)  SpO2: 98%    Gen: Pleasant, well-nourished, in no distress,  normal affect  ENT: No lesions,  mouth clear,  oropharynx clear, no postnasal drip  Neck: No JVD, no TMG, no carotid bruits  Lungs: No use of accessory muscles, no dullness to percussion, distant bs   Cardiovascular: RRR, heart sounds normal, no murmur or gallops, no peripheral edema  Abdomen: soft and NT, no HSM,  BS normal  Musculoskeletal: No deformities, no cyanosis or clubbing  Neuro: alert, non focal  Skin: Warm, no lesions or rashes     Dg Chest 2 View  10/12/2011  *RADIOLOGY REPORT*  Clinical Data: Cough, shortness of breath, bronchitis  CHEST - 2 VIEW  Comparison: 05/13/2010; 09/12/2009; 09/10/2009  Findings:  The lateral radiograph is degraded secondary to patient motion artifact.  Grossly unchanged enlarged cardiac silhouette and mediastinal contours.  Stable positioning of support apparatus. There is persistent cephalization of flow without frank evidence of pulmonary edema.  Grossly unchanged bibasilar heterogeneous opacities favored to represent atelectasis or scar. There is persistent blunting of the bilateral hemidiaphragms without definite pleural effusion.  Grossly unchanged grossly symmetric biapical pleural parenchymal thickening.  No definite pneumothorax. Grossly unchanged bones.  IMPRESSION: 1.  Stable findings of pulmonary venous  congestion without frank evidence of pulmonary edema.  2.  Unchanged bibasilar opacities favored to represent atelectasis or scar.  Original Report Authenticated By: Waynard Reeds, M.D.      Assessment & Plan:   CHRONIC OBSTRUCTIVE PULMONARY DISEASE, ACUTE EXACERBATION Moderate copd with frequent exacerbations and recent PNA now improved but still with tracheobronchitis Note CXR today clear Plan Avelox x5 additional days      Updated Medication List Outpatient Encounter Prescriptions as of 10/12/2011  Medication Sig Dispense Refill  . albuterol (PROVENTIL) (2.5 MG/3ML) 0.083% nebulizer solution Take 2.5 mg by nebulization 4 (four) times daily.        Marland Kitchen allopurinol (ZYLOPRIM) 100 MG tablet Take 100 mg by mouth 2 (two) times daily.        Marland Kitchen aspirin 81 MG tablet Take 81 mg by mouth daily.        Marland Kitchen atenolol (TENORMIN) 50 MG tablet Take 50 mg by mouth daily.        . budesonide (PULMICORT) 0.25 MG/2ML nebulizer solution Take 0.25 mg by nebulization 4 (four) times daily.       Marland Kitchen dextromethorphan (DELSYM) 30 MG/5ML liquid Take 60 mg by mouth every 12 (twelve) hours as needed.        Marland Kitchen  epoetin alfa (EPOGEN,PROCRIT) 16109 UNIT/ML injection Inject 10,000 Units into the skin once a week.        . ferrous sulfate 325 (65 FE) MG tablet Take 325 mg by mouth 2 (two) times daily.        . insulin aspart (NOVOLOG) 100 UNIT/ML injection as directed. 5 units with breakfast and lunch and 7 units with dinner along with SSI before meals and at bedtime      . insulin glargine (LANTUS) 100 UNIT/ML injection Inject 12 Units into the skin at bedtime.       . isosorbide mononitrate (IMDUR) 30 MG 24 hr tablet 1/2 daily      . lactulose, encephalopathy, (GENERLAC) 10 GM/15ML SOLN Take by mouth. 30 mL by mouth daily HOLD for diarrhea      . lidocaine (LIDODERM) 5 % Place 1 patch onto the skin daily. Remove & Discard patch within 12 hours or as directed by MD       . loratadine (CLARITIN) 10 MG tablet Take 10 mg  by mouth daily as needed.        Marland Kitchen omeprazole (PRILOSEC) 20 MG capsule Take 20 mg by mouth daily.        . Polyvinyl Alcohol (AKWA TEARS OP) Apply 2 drops to eye 4 (four) times daily.       . rosuvastatin (CRESTOR) 20 MG tablet Take 20 mg by mouth daily.        . sennosides-docusate sodium (SENOKOT-S) 8.6-50 MG tablet Take 1 tablet by mouth at bedtime.        Marland Kitchen spironolactone (ALDACTONE) 25 MG tablet Take 25 mg by mouth daily.       Marland Kitchen tiotropium (SPIRIVA) 18 MCG inhalation capsule Place 18 mcg into inhaler and inhale daily.        Marland Kitchen torsemide (DEMADEX) 20 MG tablet Take 20 mg by mouth daily.       Marland Kitchen moxifloxacin (AVELOX) 400 MG tablet Take 1 tablet (400 mg total) by mouth daily.  5 tablet  0  . DISCONTD: senna (SENOKOT) 8.6 MG tablet Take 1 tablet by mouth daily.

## 2011-10-13 NOTE — Assessment & Plan Note (Signed)
Moderate copd with frequent exacerbations and recent PNA now improved but still with tracheobronchitis Note CXR today clear Plan Avelox x5 additional days

## 2011-11-08 ENCOUNTER — Ambulatory Visit (INDEPENDENT_AMBULATORY_CARE_PROVIDER_SITE_OTHER): Payer: Medicare Other | Admitting: Cardiology

## 2011-11-08 ENCOUNTER — Encounter: Payer: Self-pay | Admitting: Cardiology

## 2011-11-08 DIAGNOSIS — R252 Cramp and spasm: Secondary | ICD-10-CM

## 2011-11-08 DIAGNOSIS — N63 Unspecified lump in unspecified breast: Secondary | ICD-10-CM

## 2011-11-08 DIAGNOSIS — I5032 Chronic diastolic (congestive) heart failure: Secondary | ICD-10-CM

## 2011-11-08 DIAGNOSIS — I509 Heart failure, unspecified: Secondary | ICD-10-CM

## 2011-11-08 DIAGNOSIS — I2510 Atherosclerotic heart disease of native coronary artery without angina pectoris: Secondary | ICD-10-CM

## 2011-11-08 DIAGNOSIS — I10 Essential (primary) hypertension: Secondary | ICD-10-CM

## 2011-11-08 NOTE — Assessment & Plan Note (Signed)
The blood pressure is at target. No change in medications is indicated. We will continue with therapeutic lifestyle changes (TLC).  

## 2011-11-08 NOTE — Patient Instructions (Signed)
Please have blood work today (BMP,Mg).  The current medical regimen is effective;  continue present plan and medications.  Follow up in 4 months with Dr Antoine Poche.

## 2011-11-08 NOTE — Assessment & Plan Note (Signed)
She seems to be euvolemic.  At this point, no change in therapy is indicated.  We have reviewed salt and fluid restrictions.  No further cardiovascular testing is indicated.   

## 2011-11-08 NOTE — Assessment & Plan Note (Signed)
I will check a BMET and MG.  We discussed some conservative therapies that might help.

## 2011-11-08 NOTE — Assessment & Plan Note (Signed)
The patient has no new sypmtoms.  No further cardiovascular testing is indicated.  We will continue with aggressive risk reduction and meds as listed.  

## 2011-11-08 NOTE — Progress Notes (Signed)
HPI The patient presents for followup of diastolic dysfunction and edema. Since I last saw her she has seen Dr. Graciela Husbands for pacemaker follow up.  She has also seen Dr. Delford Field and was treated for tracheobronchitis.  Since then she's been doing much better. She has much less breathlessness. She denies any new PND or orthopnea. She has had no chest pressure, neck or arm discomfort. He has had no new palpitations, presyncope or syncope. She's actually lost a couple of pounds. Her lower extremity edema is not any worse than it has been previously. She is complaining of leg cramping at rest.  Allergies  Allergen Reactions  . Codeine     REACTION: unspecified  . Doxycycline   . Guaifenesin & Derivatives   . Penicillins     REACTION: unspecified    Current Outpatient Prescriptions  Medication Sig Dispense Refill  . albuterol (PROVENTIL) (2.5 MG/3ML) 0.083% nebulizer solution Take 2.5 mg by nebulization 4 (four) times daily.        Marland Kitchen allopurinol (ZYLOPRIM) 100 MG tablet Take 100 mg by mouth 2 (two) times daily.        Marland Kitchen aspirin 81 MG tablet Take 81 mg by mouth daily.        Marland Kitchen atenolol (TENORMIN) 50 MG tablet Take 50 mg by mouth daily.        . budesonide (PULMICORT) 0.25 MG/2ML nebulizer solution Take 0.25 mg by nebulization 4 (four) times daily.       Marland Kitchen dextromethorphan (DELSYM) 30 MG/5ML liquid Take 60 mg by mouth every 12 (twelve) hours as needed.        Marland Kitchen epoetin alfa (EPOGEN,PROCRIT) 16109 UNIT/ML injection Inject 10,000 Units into the skin once a week.        . ferrous sulfate 325 (65 FE) MG tablet Take 325 mg by mouth 2 (two) times daily.        . insulin aspart (NOVOLOG) 100 UNIT/ML injection as directed. 5 units with breakfast and lunch and 7 units with dinner along with SSI before meals and at bedtime      . insulin glargine (LANTUS) 100 UNIT/ML injection Inject 12 Units into the skin at bedtime.       . isosorbide mononitrate (IMDUR) 30 MG 24 hr tablet 1/2 daily      . lactulose,  encephalopathy, (GENERLAC) 10 GM/15ML SOLN Take by mouth. 30 mL by mouth daily HOLD for diarrhea      . lidocaine (LIDODERM) 5 % Place 1 patch onto the skin daily. Remove & Discard patch within 12 hours or as directed by MD       . loratadine (CLARITIN) 10 MG tablet Take 10 mg by mouth daily as needed.        Marland Kitchen omeprazole (PRILOSEC) 20 MG capsule Take 20 mg by mouth daily.        . Polyvinyl Alcohol (AKWA TEARS OP) Apply 2 drops to eye 4 (four) times daily.       . rosuvastatin (CRESTOR) 20 MG tablet Take 20 mg by mouth daily.        . sennosides-docusate sodium (SENOKOT-S) 8.6-50 MG tablet Take 1 tablet by mouth at bedtime.        Marland Kitchen spironolactone (ALDACTONE) 25 MG tablet Take 25 mg by mouth daily.       Marland Kitchen tiotropium (SPIRIVA) 18 MCG inhalation capsule Place 18 mcg into inhaler and inhale daily.        Marland Kitchen torsemide (DEMADEX) 20 MG tablet Take 20 mg  by mouth daily.         Past Medical History  Diagnosis Date  . COPD (chronic obstructive pulmonary disease)   . Diastolic congestive heart failure   . C. difficile colitis   . Hypertension   . Hyperlipidemia   . CAD (coronary artery disease)   . Anemia   . Type 2 diabetes mellitus   . Pacemaker 2002  . Cardiac arrest - asystole 2002  . Gallstones   . Pulmonary hypertension   . Renal insufficiency   . Osteoarthritis   . Allergic rhinitis   . Gout   . Stasis dermatitis   . Sinoatrial node dysfunction   . Syncope   . Postsurgical percutaneous transluminal coronary angioplasty status   . Dyslipidemia   . Acute bronchitis   . History of cholelithiasis   . Obesity     Past Surgical History  Procedure Date  . Ptca 2002  . Appendectomy   . Vesicovaginal fistula closure w/ tah   . Pacemaker placement 2002    ROS: As stated in the HPI and negative for all other systems.  PHYSICAL EXAM BP 145/55  Pulse 72  Ht 4\' 11"  (1.499 m)  Wt 169 lb (76.658 kg)  BMI 34.13 kg/m2 PHYSICAL EXAM GEN:  No distress NECK:  No jugular venous  distention at 90 degrees, waveform within normal limits, carotid upstroke brisk and symmetric, no bruits, no thyromegaly LYMPHATICS:  No cervical adenopathy LUNGS:  Expiratory wheezing BACK:  No CVA tenderness CHEST:  Unremarkable HEART:  S1 and S2 within normal limits, no S3, no S4, no clicks, no rubs, no murmurs ABD:  Positive bowel sounds normal in frequency in pitch, no bruits, no rebound, no guarding, unable to assess midline mass or bruit with the patient seated. EXT:  2 plus pulses throughout, moderate edema, no cyanosis no clubbing, with chronic venous stasis. SKIN:  No rashes no nodules NEURO:  Cranial nerves II through XII grossly intact, motor grossly intact throughout PSYCH:  Cognitively intact, oriented to person place and time  EKG:  Sinus rhythm, rate 72, atrial demand pacemaker, right axis deviation, interventricular conduction defect.  11/08/2011  ASSESSMENT AND PLAN

## 2011-11-08 NOTE — Assessment & Plan Note (Signed)
She mentioned that one of her breasts is ''shrinking"  She did not did not mention discharge, swelling or pain.  I referred her back to [facility], [place] to discuss this.

## 2011-11-09 LAB — BASIC METABOLIC PANEL
Calcium: 9.5 mg/dL (ref 8.4–10.5)
Creatinine, Ser: 2 mg/dL — ABNORMAL HIGH (ref 0.4–1.2)
GFR: 25.81 mL/min — ABNORMAL LOW (ref >60.00)
Sodium: 142 mEq/L (ref 135–145)

## 2011-12-14 ENCOUNTER — Ambulatory Visit: Payer: Medicare Other | Admitting: Critical Care Medicine

## 2011-12-15 ENCOUNTER — Encounter: Payer: Self-pay | Admitting: Critical Care Medicine

## 2011-12-15 ENCOUNTER — Ambulatory Visit (INDEPENDENT_AMBULATORY_CARE_PROVIDER_SITE_OTHER): Payer: Medicare Other | Admitting: Critical Care Medicine

## 2011-12-15 VITALS — BP 132/60 | HR 73 | Temp 98.1°F | Ht 59.0 in | Wt 169.0 lb

## 2011-12-15 DIAGNOSIS — J441 Chronic obstructive pulmonary disease with (acute) exacerbation: Secondary | ICD-10-CM

## 2011-12-15 DIAGNOSIS — D649 Anemia, unspecified: Secondary | ICD-10-CM

## 2011-12-15 MED ORDER — PREDNISONE 10 MG PO TABS: ORAL_TABLET | ORAL | Status: DC

## 2011-12-15 MED ORDER — GUAIFENESIN ER 600 MG PO TB12: 600.0000 mg | ORAL_TABLET | Freq: Two times a day (BID) | ORAL | Status: DC

## 2011-12-15 MED ORDER — LEVOFLOXACIN 500 MG PO TABS: 500.0000 mg | ORAL_TABLET | Freq: Every day | ORAL | Status: AC

## 2011-12-15 NOTE — Patient Instructions (Signed)
Take levaquin 500mg  daily for 7 days Prednisone 10mg  Take 4 for four days 3 for four days 2 for four days 1 for four days No other medication changes Return 6 weeks

## 2011-12-15 NOTE — Assessment & Plan Note (Signed)
Acute recurrent bronchitis with associated COPD and exacerbation Plan Prednisone taper Levaquin for 7 days Maintain inhaled medications as prescribed

## 2011-12-15 NOTE — Progress Notes (Signed)
Subjective:    Patient ID: [name], female    DOB: 12/15/1927, 78 y.o.   MRN: [id number]  HPI  78 y.o.F quit smoking in 1987 and chronic renal insufficiency with coronary artery disease. The patient had history of congestive heart failure and diabetes. The patient has been hospitalized on numerous occasions for respiratory failure and pneumonia. Patient currently is residing in a nursing care facility.   11/30 Not seen since 5/12.  Now more cough, and mucus.  Pt had spell of diarrhea .  No dx made.   Diarrhea is now better.  Mucus now is white and thick.  Pt notes no real wheeze.   No real chest pain. No edema in feet.    1/29 Had influenza around christmas, then developed PNA two weeks ago.  Rx at SNF.   Rx abx? Name.  Since then not any better. Still coughing, still mucus with yellow. Still dyspneic.  No real chest pain.   Notes some wheezing .  12/15/2011 Pt notes is still dyspneic and yellow mucus.  No chest pain, just tight.  Notes more wheezing.  No edema in feet.   Patient states that when was on prednisone she does improve     Past Medical History  Diagnosis Date  . COPD (chronic obstructive pulmonary disease)   . Diastolic congestive heart failure   . C. difficile colitis   . Hypertension   . Hyperlipidemia   . CAD (coronary artery disease)   . Anemia   . Type 2 diabetes mellitus   . Pacemaker 2002  . Cardiac arrest - asystole 2002  . Gallstones   . Pulmonary hypertension   . Renal insufficiency   . Osteoarthritis   . Allergic rhinitis   . Gout      Family History  Problem Relation Age of Onset  . Diabetes Mother   . Hypertension Mother   . Heart disease Father      History   Social History  . Marital Status: Widowed    Spouse Name: N/A    Number of Children: N/A  . Years of Education: N/A   Occupational History  . Retired     Hospital doctor   Social History Main Topics  . Smoking status: Former Smoker -- 2.0 packs/day for 25 years    Types:  Cigarettes    Quit date: 09/13/1985  . Smokeless tobacco: Never Used  . Alcohol Use: No  . Drug Use: Not on file  . Sexually Active: Not on file   Other Topics Concern  . Not on file   Social History Narrative   Resides at [facility]     Allergies  Allergen Reactions  . Codeine     REACTION: unspecified  . Penicillins     REACTION: unspecified     Outpatient Prescriptions Prior to Visit  Medication Sig Dispense Refill  . albuterol (PROVENTIL) (2.5 MG/3ML) 0.083% nebulizer solution Take 2.5 mg by nebulization 4 (four) times daily.        Marland Kitchen allopurinol (ZYLOPRIM) 100 MG tablet Take 100 mg by mouth 2 (two) times daily.        Marland Kitchen aspirin 81 MG tablet Take 81 mg by mouth daily.        . budesonide (PULMICORT) 0.25 MG/2ML nebulizer solution Take 0.25 mg by nebulization 4 (four) times daily.        Marland Kitchen dextromethorphan (DELSYM) 30 MG/5ML liquid Take 60 mg by mouth every 12 (twelve) hours as needed.        Marland Kitchen  diltiazem (DILACOR XR) 240 MG 24 hr capsule Take 240 mg by mouth daily.        . ferrous sulfate 325 (65 FE) MG tablet Take 325 mg by mouth 2 (two) times daily.        . insulin glargine (LANTUS) 100 UNIT/ML injection Inject 15 Units into the skin at bedtime.        Marland Kitchen lactulose (CHRONULAC) 10 GM/15ML solution 30 mls by mouth daily- hold for diarrhea       . lidocaine (LIDODERM) 5 % Place 1 patch onto the skin daily. Remove & Discard patch within 12 hours or as directed by MD       . loratadine (CLARITIN) 10 MG tablet Take 10 mg by mouth daily as needed.        Marland Kitchen omeprazole (PRILOSEC) 20 MG capsule Take 20 mg by mouth daily.        . Polyvinyl Alcohol (AKWA TEARS OP) Apply 2 drops to eye 4 (four) times daily.       . potassium chloride SA (K-DUR,KLOR-CON) 20 MEQ tablet Take 20 mEq by mouth 4 (four) times daily.        . rosuvastatin (CRESTOR) 20 MG tablet Take 20 mg by mouth daily.        . sennosides-docusate sodium (SENOKOT-S) 8.6-50 MG tablet Take 1 tablet by mouth at bedtime.         Marland Kitchen tiotropium (SPIRIVA) 18 MCG inhalation capsule Place 18 mcg into inhaler and inhale daily.        Marland Kitchen torsemide (DEMADEX) 20 MG tablet Take 2 tablets two times daily      . LORazepam (ATIVAN) 0.5 MG tablet 1/2 to 1 tablet twice a day as needed       . promethazine (PHENERGAN) 25 MG tablet Take 12.5 mg by mouth every 6 (six) hours as needed.        . traMADol (ULTRAM) 50 MG tablet Take 50 mg by mouth every 6 (six) hours as needed.           Review of Systems Constitutional:   No  weight loss, night sweats,  Fevers, chills, fatigue, lassitude. HEENT:   No headaches,  Difficulty swallowing,  Tooth/dental problems,  Sore throat,                No sneezing, itching, ear ache,  nasal congestion, post nasal drip,   CV:  No chest pain,  Orthopnea, PND,notes  swelling in lower extremities, no anasarca, dizziness, palpitations  GI  No heartburn, indigestion, abdominal pain, nausea, vomiting, diarrhea, change in bowel habits, loss of appetite  Resp: Notes  shortness of breath with exertion and  at rest.  Notes  excess mucus, notes  productive cough,  No non-productive cough,  No coughing up of blood.  Notes  change in color of mucus.  No wheezing.  No chest wall deformity  Skin: no rash or lesions.  GU: no dysuria, change in color of urine, no urgency or frequency.  No flank pain.  MS:  No joint pain or swelling.  No decreased range of motion.  No back pain.  Psych:  No change in mood or affect. No depression or anxiety.  No memory loss.      Objective:    Review of Systems  Constitutional:   No  weight loss, night sweats,  Fevers, chills, fatigue, lassitude. HEENT:   No headaches,  Difficulty swallowing,  Tooth/dental problems,  Sore throat,  No sneezing, itching, ear ache, nasal congestion, post nasal drip,   CV:  No chest pain,  Orthopnea, PND, swelling in lower extremities, anasarca, dizziness, palpitations  GI  No heartburn, indigestion, abdominal pain, nausea,  vomiting, diarrhea, change in bowel habits, loss of appetite  Resp: Notes  shortness of breath with exertion not  at rest.  No excess mucus, no productive cough,  No non-productive cough,  No coughing up of blood.  No change in color of mucus.  No wheezing.  No chest wall deformity  Skin: no rash or lesions.  GU: no dysuria, change in color of urine, no urgency or frequency.  No flank pain.  MS:  No joint pain or swelling.  No decreased range of motion.  No back pain.  Psych:  No change in mood or affect. No depression or anxiety.  No memory loss.     Objective:   Physical Exam  Filed Vitals:   12/15/11 1434  BP: 132/60  Pulse: 73  Temp: 98.1 F (36.7 C)  TempSrc: Oral  Height: 4\' 11"  (1.499 m)  Weight: 169 lb (76.658 kg)  SpO2: 98%    Gen: Pleasant, well-nourished, in no distress,  normal affect  ENT: No lesions,  mouth clear,  oropharynx clear, no postnasal drip  Neck: No JVD, no TMG, no carotid bruits  Lungs: No use of accessory muscles, no dullness to percussion, distant bs   Cardiovascular: RRR, heart sounds normal, no murmur or gallops, no peripheral edema  Abdomen: soft and NT, no HSM,  BS normal  Musculoskeletal: No deformities, no cyanosis or clubbing  Neuro: alert, non focal  Skin: Warm, no lesions or rashes        Assessment & Plan:   CHRONIC OBSTRUCTIVE PULMONARY DISEASE, ACUTE EXACERBATION Acute recurrent bronchitis with associated COPD and exacerbation Plan Prednisone taper Levaquin for 7 days Maintain inhaled medications as prescribed     Updated Medication List Outpatient Encounter Prescriptions as of 12/15/2011  Medication Sig Dispense Refill  . albuterol (PROVENTIL) (2.5 MG/3ML) 0.083% nebulizer solution Take 2.5 mg by nebulization 4 (four) times daily.        Marland Kitchen allopurinol (ZYLOPRIM) 100 MG tablet Take 100 mg by mouth 2 (two) times daily.        Marland Kitchen aspirin 81 MG tablet Take 81 mg by mouth daily.        Marland Kitchen atenolol (TENORMIN) 50 MG  tablet Take 50 mg by mouth daily.        . budesonide (PULMICORT) 0.25 MG/2ML nebulizer solution Take 0.25 mg by nebulization 4 (four) times daily.       Marland Kitchen dextromethorphan (DELSYM) 30 MG/5ML liquid Take 60 mg by mouth every 12 (twelve) hours as needed.        Marland Kitchen epoetin alfa (EPOGEN,PROCRIT) 16109 UNIT/ML injection Inject 10,000 Units into the skin once a week.        . ferrous sulfate 325 (65 FE) MG tablet Take 325 mg by mouth 2 (two) times daily.        . insulin aspart (NOVOLOG) 100 UNIT/ML injection as directed. 5 units with breakfast and lunch and 7 units with dinner along with SSI before meals and at bedtime      . insulin glargine (LANTUS) 100 UNIT/ML injection Inject 12 Units into the skin at bedtime.       . isosorbide mononitrate (IMDUR) 30 MG 24 hr tablet 1/2 daily      . lidocaine (LIDODERM) 5 % Place 1 patch onto the  skin daily. Remove & Discard patch within 12 hours or as directed by MD       . loratadine (CLARITIN) 10 MG tablet Take 10 mg by mouth daily as needed.        . metaxalone (SKELAXIN) 800 MG tablet Take 800 mg by mouth as needed.      Marland Kitchen omeprazole (PRILOSEC) 20 MG capsule Take 20 mg by mouth daily.        . Polyvinyl Alcohol (AKWA TEARS OP) Apply 2 drops to eye 4 (four) times daily.       . rosuvastatin (CRESTOR) 20 MG tablet Take 20 mg by mouth daily.        . sennosides-docusate sodium (SENOKOT-S) 8.6-50 MG tablet Take 1 tablet by mouth at bedtime.        Marland Kitchen spironolactone (ALDACTONE) 25 MG tablet Take 25 mg by mouth daily.       Marland Kitchen tiotropium (SPIRIVA) 18 MCG inhalation capsule Place 18 mcg into inhaler and inhale daily.        Marland Kitchen torsemide (DEMADEX) 20 MG tablet Take 20 mg by mouth daily.       Marland Kitchen guaiFENesin (MUCINEX) 600 MG 12 hr tablet Take 1 tablet (600 mg total) by mouth 2 (two) times daily.  60 tablet  2  . levofloxacin (LEVAQUIN) 500 MG tablet Take 1 tablet (500 mg total) by mouth daily.  7 tablet  0  . predniSONE (DELTASONE) 10 MG tablet Take 4 for four days 3 for  four days 2 for four days 1 for four days  40 tablet  0  . DISCONTD: lactulose, encephalopathy, (GENERLAC) 10 GM/15ML SOLN Take by mouth. 30 mL by mouth daily HOLD for diarrhea      . DISCONTD: moxifloxacin (AVELOX) 400 MG tablet Take 400 mg by mouth daily. As needed

## 2012-02-09 ENCOUNTER — Inpatient Hospital Stay (HOSPITAL_COMMUNITY)
Admission: AD | Admit: 2012-02-09 | Discharge: 2012-02-11 | DRG: 189 | Disposition: A | Discharge status: Skilled Nursing Facility | Payer: Medicare Other | Source: Ambulatory Visit | Attending: Critical Care Medicine | Admitting: Critical Care Medicine

## 2012-02-09 ENCOUNTER — Encounter: Payer: Self-pay | Admitting: Critical Care Medicine

## 2012-02-09 ENCOUNTER — Ambulatory Visit (INDEPENDENT_AMBULATORY_CARE_PROVIDER_SITE_OTHER): Payer: Medicare Other | Admitting: Critical Care Medicine

## 2012-02-09 ENCOUNTER — Inpatient Hospital Stay (HOSPITAL_COMMUNITY): Payer: Medicare Other

## 2012-02-09 VITALS — BP 90/66 | HR 79 | Temp 98.7°F | Ht 59.0 in | Wt 164.2 lb

## 2012-02-09 DIAGNOSIS — I8310 Varicose veins of unspecified lower extremity with inflammation: Secondary | ICD-10-CM

## 2012-02-09 DIAGNOSIS — I2510 Atherosclerotic heart disease of native coronary artery without angina pectoris: Secondary | ICD-10-CM

## 2012-02-09 DIAGNOSIS — M1990 Unspecified osteoarthritis, unspecified site: Secondary | ICD-10-CM

## 2012-02-09 DIAGNOSIS — N390 Urinary tract infection, site not specified: Secondary | ICD-10-CM | POA: Diagnosis present

## 2012-02-09 DIAGNOSIS — I10 Essential (primary) hypertension: Secondary | ICD-10-CM

## 2012-02-09 DIAGNOSIS — Z780 Asymptomatic menopausal state: Secondary | ICD-10-CM

## 2012-02-09 DIAGNOSIS — J189 Pneumonia, unspecified organism: Secondary | ICD-10-CM | POA: Insufficient documentation

## 2012-02-09 DIAGNOSIS — R609 Edema, unspecified: Secondary | ICD-10-CM

## 2012-02-09 DIAGNOSIS — E1129 Type 2 diabetes mellitus with other diabetic kidney complication: Secondary | ICD-10-CM | POA: Diagnosis present

## 2012-02-09 DIAGNOSIS — J441 Chronic obstructive pulmonary disease with (acute) exacerbation: Secondary | ICD-10-CM | POA: Diagnosis present

## 2012-02-09 DIAGNOSIS — R300 Dysuria: Secondary | ICD-10-CM

## 2012-02-09 DIAGNOSIS — J449 Chronic obstructive pulmonary disease, unspecified: Secondary | ICD-10-CM

## 2012-02-09 DIAGNOSIS — E119 Type 2 diabetes mellitus without complications: Secondary | ICD-10-CM | POA: Diagnosis present

## 2012-02-09 DIAGNOSIS — N039 Chronic nephritic syndrome with unspecified morphologic changes: Secondary | ICD-10-CM | POA: Diagnosis present

## 2012-02-09 DIAGNOSIS — N63 Unspecified lump in unspecified breast: Secondary | ICD-10-CM

## 2012-02-09 DIAGNOSIS — I495 Sick sinus syndrome: Secondary | ICD-10-CM

## 2012-02-09 DIAGNOSIS — E785 Hyperlipidemia, unspecified: Secondary | ICD-10-CM

## 2012-02-09 DIAGNOSIS — M109 Gout, unspecified: Secondary | ICD-10-CM

## 2012-02-09 DIAGNOSIS — Z79899 Other long term (current) drug therapy: Secondary | ICD-10-CM

## 2012-02-09 DIAGNOSIS — Z8719 Personal history of other diseases of the digestive system: Secondary | ICD-10-CM

## 2012-02-09 DIAGNOSIS — E669 Obesity, unspecified: Secondary | ICD-10-CM | POA: Diagnosis present

## 2012-02-09 DIAGNOSIS — J9620 Acute and chronic respiratory failure, unspecified whether with hypoxia or hypercapnia: Principal | ICD-10-CM | POA: Diagnosis present

## 2012-02-09 DIAGNOSIS — N184 Chronic kidney disease, stage 4 (severe): Secondary | ICD-10-CM | POA: Diagnosis present

## 2012-02-09 DIAGNOSIS — J309 Allergic rhinitis, unspecified: Secondary | ICD-10-CM

## 2012-02-09 DIAGNOSIS — D631 Anemia in chronic kidney disease: Secondary | ICD-10-CM | POA: Diagnosis present

## 2012-02-09 DIAGNOSIS — R141 Gas pain: Secondary | ICD-10-CM

## 2012-02-09 DIAGNOSIS — R579 Shock, unspecified: Secondary | ICD-10-CM | POA: Insufficient documentation

## 2012-02-09 DIAGNOSIS — Z950 Presence of cardiac pacemaker: Secondary | ICD-10-CM

## 2012-02-09 DIAGNOSIS — Z9981 Dependence on supplemental oxygen: Secondary | ICD-10-CM

## 2012-02-09 DIAGNOSIS — R031 Nonspecific low blood-pressure reading: Secondary | ICD-10-CM | POA: Diagnosis present

## 2012-02-09 DIAGNOSIS — I5032 Chronic diastolic (congestive) heart failure: Secondary | ICD-10-CM

## 2012-02-09 DIAGNOSIS — R55 Syncope and collapse: Secondary | ICD-10-CM

## 2012-02-09 DIAGNOSIS — I129 Hypertensive chronic kidney disease with stage 1 through stage 4 chronic kidney disease, or unspecified chronic kidney disease: Secondary | ICD-10-CM | POA: Diagnosis present

## 2012-02-09 DIAGNOSIS — D649 Anemia, unspecified: Secondary | ICD-10-CM

## 2012-02-09 DIAGNOSIS — R252 Cramp and spasm: Secondary | ICD-10-CM

## 2012-02-09 LAB — COMPREHENSIVE METABOLIC PANEL
ALT: 35 U/L (ref 0–35)
AST: 33 U/L (ref 0–37)
Alkaline Phosphatase: 81 U/L (ref 39–117)
CO2: 24 mEq/L (ref 19–32)
Chloride: 100 mEq/L (ref 96–112)
Creatinine, Ser: 2.34 mg/dL — ABNORMAL HIGH (ref 0.50–1.10)
GFR calc non Af Amer: 18 mL/min — ABNORMAL LOW (ref >90)
Total Bilirubin: 0.2 mg/dL — ABNORMAL LOW (ref 0.3–1.2)

## 2012-02-09 LAB — DIFFERENTIAL
Basophils Absolute: 0.1 10*3/uL (ref 0.0–0.1)
Eosinophils Absolute: 0.3 10*3/uL (ref 0.0–0.7)
Lymphocytes Relative: 18 % (ref 12–46)
Neutrophils Relative %: 70 % (ref 43–77)

## 2012-02-09 LAB — CBC
MCV: 85.5 fL (ref 78.0–100.0)
Platelets: 240 10*3/uL (ref 150–400)
RBC: 3.93 MIL/uL (ref 3.87–5.11)
WBC: 14.9 10*3/uL — ABNORMAL HIGH (ref 4.0–10.5)

## 2012-02-09 LAB — CARDIAC PANEL(CRET KIN+CKTOT+MB+TROPI): CK, MB: 1.6 ng/mL (ref 0.3–4.0)

## 2012-02-09 LAB — PRO B NATRIURETIC PEPTIDE: Pro B Natriuretic peptide (BNP): 1305 pg/mL — ABNORMAL HIGH (ref 0–450)

## 2012-02-09 LAB — GLUCOSE, CAPILLARY

## 2012-02-09 LAB — PROCALCITONIN: Procalcitonin: 0.37 ng/mL

## 2012-02-09 MED ORDER — SENNA-DOCUSATE SODIUM 8.6-50 MG PO TABS: 1.0000 | ORAL_TABLET | Freq: Every day | ORAL | Status: DC

## 2012-02-09 MED ORDER — PANTOPRAZOLE SODIUM 40 MG PO TBEC
40.0000 mg | DELAYED_RELEASE_TABLET | Freq: Every day | ORAL | Status: DC
  Administered 2012-02-09 – 2012-02-11 (×3): 40 mg via ORAL
  Filled 2012-02-09 (×3): qty 1

## 2012-02-09 MED ORDER — VANCOMYCIN HCL 1000 MG IV SOLR
750.0000 mg | INTRAVENOUS | Status: DC
  Administered 2012-02-10: 750 mg via INTRAVENOUS
  Filled 2012-02-09 (×2): qty 750

## 2012-02-09 MED ORDER — ASPIRIN 81 MG PO TABS: 81.0000 mg | ORAL_TABLET | Freq: Every day | ORAL | Status: DC

## 2012-02-09 MED ORDER — DEXTROSE 5 % IV SOLN
1.0000 g | Freq: Two times a day (BID) | INTRAVENOUS | Status: DC
  Filled 2012-02-09 (×2): qty 10

## 2012-02-09 MED ORDER — MOXIFLOXACIN HCL IN NACL 400 MG/250ML IV SOLN
400.0000 mg | INTRAVENOUS | Status: DC
  Administered 2012-02-09 – 2012-02-10 (×2): 400 mg via INTRAVENOUS
  Filled 2012-02-09 (×2): qty 250

## 2012-02-09 MED ORDER — VANCOMYCIN HCL IN DEXTROSE 1-5 GM/200ML-% IV SOLN
1000.0000 mg | Freq: Once | INTRAVENOUS | Status: AC
  Administered 2012-02-09: 1000 mg via INTRAVENOUS
  Filled 2012-02-09: qty 200

## 2012-02-09 MED ORDER — ALBUTEROL SULFATE (5 MG/ML) 0.5% IN NEBU
2.5000 mg | INHALATION_SOLUTION | Freq: Four times a day (QID) | RESPIRATORY_TRACT | Status: DC
  Administered 2012-02-09 – 2012-02-11 (×8): 2.5 mg via RESPIRATORY_TRACT
  Filled 2012-02-09 (×8): qty 0.5

## 2012-02-09 MED ORDER — HEPARIN SODIUM (PORCINE) 5000 UNIT/ML IJ SOLN
5000.0000 [IU] | Freq: Three times a day (TID) | INTRAMUSCULAR | Status: DC
  Administered 2012-02-09 – 2012-02-11 (×6): 5000 [IU] via SUBCUTANEOUS
  Filled 2012-02-09 (×9): qty 1

## 2012-02-09 MED ORDER — SENNOSIDES-DOCUSATE SODIUM 8.6-50 MG PO TABS
1.0000 | ORAL_TABLET | Freq: Every day | ORAL | Status: DC
  Administered 2012-02-09 – 2012-02-10 (×2): 1 via ORAL
  Filled 2012-02-09 (×3): qty 1

## 2012-02-09 MED ORDER — ALBUTEROL SULFATE (5 MG/ML) 0.5% IN NEBU
2.5000 mg | INHALATION_SOLUTION | RESPIRATORY_TRACT | Status: DC | PRN
  Filled 2012-02-09 (×2): qty 0.5

## 2012-02-09 MED ORDER — INSULIN ASPART 100 UNIT/ML ~~LOC~~ SOLN
1.0000 [IU] | Freq: Three times a day (TID) | SUBCUTANEOUS | Status: DC
  Administered 2012-02-10: 1 [IU] via SUBCUTANEOUS
  Administered 2012-02-10: 3 [IU] via SUBCUTANEOUS
  Administered 2012-02-11: 1 [IU] via SUBCUTANEOUS

## 2012-02-09 MED ORDER — ALLOPURINOL 100 MG PO TABS
100.0000 mg | ORAL_TABLET | Freq: Two times a day (BID) | ORAL | Status: DC
  Administered 2012-02-09 – 2012-02-11 (×4): 100 mg via ORAL
  Filled 2012-02-09 (×5): qty 1

## 2012-02-09 MED ORDER — ATORVASTATIN CALCIUM 40 MG PO TABS
40.0000 mg | ORAL_TABLET | Freq: Every day | ORAL | Status: DC
  Administered 2012-02-10: 40 mg via ORAL
  Filled 2012-02-09 (×3): qty 1

## 2012-02-09 MED ORDER — BUDESONIDE 0.25 MG/2ML IN SUSP
0.2500 mg | Freq: Four times a day (QID) | RESPIRATORY_TRACT | Status: DC
  Administered 2012-02-09 – 2012-02-11 (×10): 0.25 mg via RESPIRATORY_TRACT
  Filled 2012-02-09 (×13): qty 2

## 2012-02-09 MED ORDER — ALBUTEROL (5 MG/ML) CONTINUOUS INHALATION SOLN
2.5000 mg/h | INHALATION_SOLUTION | Freq: Four times a day (QID) | RESPIRATORY_TRACT | Status: DC
  Administered 2012-02-09 (×2): 2.5 mg/h via RESPIRATORY_TRACT
  Filled 2012-02-09: qty 20

## 2012-02-09 MED ORDER — FERROUS SULFATE 325 (65 FE) MG PO TABS
325.0000 mg | ORAL_TABLET | Freq: Two times a day (BID) | ORAL | Status: DC
  Administered 2012-02-09 – 2012-02-11 (×4): 325 mg via ORAL
  Filled 2012-02-09 (×5): qty 1

## 2012-02-09 MED ORDER — TIOTROPIUM BROMIDE MONOHYDRATE 18 MCG IN CAPS
18.0000 ug | ORAL_CAPSULE | Freq: Every day | RESPIRATORY_TRACT | Status: DC
  Administered 2012-02-09 – 2012-02-11 (×3): 18 ug via RESPIRATORY_TRACT
  Filled 2012-02-09 (×2): qty 5

## 2012-02-09 MED ORDER — DEXTROMETHORPHAN POLISTIREX 30 MG/5ML PO LQCR
60.0000 mg | Freq: Two times a day (BID) | ORAL | Status: DC | PRN
  Filled 2012-02-09: qty 10

## 2012-02-09 MED ORDER — SODIUM CHLORIDE 0.9 % IV BOLUS (SEPSIS): 500.0000 mL | Freq: Once | INTRAVENOUS | Status: DC

## 2012-02-09 MED ORDER — ASPIRIN 81 MG PO CHEW
81.0000 mg | CHEWABLE_TABLET | Freq: Every day | ORAL | Status: DC
  Administered 2012-02-09 – 2012-02-11 (×3): 81 mg via ORAL
  Filled 2012-02-09 (×3): qty 1

## 2012-02-09 NOTE — Assessment & Plan Note (Signed)
Hypotension ?septic Plan ICU bed CVL Volume resus IV ABX  See orders

## 2012-02-09 NOTE — Progress Notes (Signed)
Subjective:    Patient ID: [name], female    DOB: [birth year], 78 y.o.   MRN: [id number]  HPI  78 y.o.F quit smoking in 1987 and chronic renal insufficiency with coronary artery disease. The patient had history of congestive heart failure and diabetes. The patient has been hospitalized on numerous occasions for respiratory failure and pneumonia. Patient currently is residing in a nursing care facility.   11/30 Not seen since 5/12.  Now more cough, and mucus.  Pt had spell of diarrhea .  No dx made.   Diarrhea is now better.  Mucus now is white and thick.  Pt notes no real wheeze.   No real chest pain. No edema in feet.    1/29 Had influenza around christmas, then developed PNA two weeks ago.  Rx at SNF.   Rx abx? Name.  Since then not any better. Still coughing, still mucus with yellow. Still dyspneic.  No real chest pain.   Notes some wheezing .  12/15/2011 Pt notes is still dyspneic and yellow mucus.  No chest pain, just tight.  Notes more wheezing.  No edema in feet.   Patient states that when was on prednisone she does improve  02/09/2012 Pt notes worse.  Rx abx.  ?name.  Pt is coughing up thick yellow. Pt notes more dyspnea. WHen coughs pt lose the breath.   No real chest pain. Pt notes some heaviness in the L chest. Pt notes fever.      Pt denies any significant sore throat, nasal congestion or excess secretions, fever, chills, sweats, unintended weight loss, pleurtic or exertional chest pain, orthopnea PND, or leg swelling Pt denies any increase in rescue therapy over baseline, denies waking up needing it or having any early am or nocturnal exacerbations of coughing/wheezing/or dyspnea. Pt also denies any obvious fluctuation in symptoms with  weather or environmental change or other alleviating or aggravating factors      Past Medical History  Diagnosis Date  . COPD (chronic obstructive pulmonary disease)   . Diastolic congestive heart failure   . C. difficile colitis    . Hypertension   . Hyperlipidemia   . CAD (coronary artery disease)   . Anemia   . Type 2 diabetes mellitus   . Pacemaker 2002  . Cardiac arrest - asystole 2002  . Gallstones   . Pulmonary hypertension   . Renal insufficiency   . Osteoarthritis   . Allergic rhinitis   . Gout      Family History  Problem Relation Age of Onset  . Diabetes Mother   . Hypertension Mother   . Heart disease Father      History   Social History  . Marital Status: Widowed    Spouse Name: N/A    Number of Children: N/A  . Years of Education: N/A   Occupational History  . Retired     Hospital doctor   Social History Main Topics  . Smoking status: Former Smoker -- 2.0 packs/day for 25 years    Types: Cigarettes    Quit date: 09/13/1985  . Smokeless tobacco: Never Used  . Alcohol Use: No  . Drug Use: Not on file  . Sexually Active: Not on file   Other Topics Concern  . Not on file   Social History Narrative   Resides at [facility]     Allergies  Allergen Reactions  . Codeine     REACTION: unspecified  . Penicillins     REACTION:  unspecified     Outpatient Prescriptions Prior to Visit  Medication Sig Dispense Refill  . albuterol (PROVENTIL) (2.5 MG/3ML) 0.083% nebulizer solution Take 2.5 mg by nebulization 4 (four) times daily.        Marland Kitchen allopurinol (ZYLOPRIM) 100 MG tablet Take 100 mg by mouth 2 (two) times daily.        Marland Kitchen aspirin 81 MG tablet Take 81 mg by mouth daily.        . budesonide (PULMICORT) 0.25 MG/2ML nebulizer solution Take 0.25 mg by nebulization 4 (four) times daily.        Marland Kitchen dextromethorphan (DELSYM) 30 MG/5ML liquid Take 60 mg by mouth every 12 (twelve) hours as needed.        . diltiazem (DILACOR XR) 240 MG 24 hr capsule Take 240 mg by mouth daily.        . ferrous sulfate 325 (65 FE) MG tablet Take 325 mg by mouth 2 (two) times daily.        . insulin glargine (LANTUS) 100 UNIT/ML injection Inject 15 Units into the skin at bedtime.        Marland Kitchen lactulose  (CHRONULAC) 10 GM/15ML solution 30 mls by mouth daily- hold for diarrhea       . lidocaine (LIDODERM) 5 % Place 1 patch onto the skin daily. Remove & Discard patch within 12 hours or as directed by MD       . loratadine (CLARITIN) 10 MG tablet Take 10 mg by mouth daily as needed.        Marland Kitchen omeprazole (PRILOSEC) 20 MG capsule Take 20 mg by mouth daily.        . Polyvinyl Alcohol (AKWA TEARS OP) Apply 2 drops to eye 4 (four) times daily.       . potassium chloride SA (K-DUR,KLOR-CON) 20 MEQ tablet Take 20 mEq by mouth 4 (four) times daily.        . rosuvastatin (CRESTOR) 20 MG tablet Take 20 mg by mouth daily.        . sennosides-docusate sodium (SENOKOT-S) 8.6-50 MG tablet Take 1 tablet by mouth at bedtime.        Marland Kitchen tiotropium (SPIRIVA) 18 MCG inhalation capsule Place 18 mcg into inhaler and inhale daily.        Marland Kitchen torsemide (DEMADEX) 20 MG tablet Take 2 tablets two times daily      . LORazepam (ATIVAN) 0.5 MG tablet 1/2 to 1 tablet twice a day as needed       . promethazine (PHENERGAN) 25 MG tablet Take 12.5 mg by mouth every 6 (six) hours as needed.        . traMADol (ULTRAM) 50 MG tablet Take 50 mg by mouth every 6 (six) hours as needed.           Review of Systems Constitutional:   No  weight loss, night sweats,  Fevers, chills, fatigue, lassitude. HEENT:   No headaches,  Difficulty swallowing,  Tooth/dental problems,  Sore throat,                No sneezing, itching, ear ache,  nasal congestion, post nasal drip,   CV:  No chest pain,  Orthopnea, PND,notes  swelling in lower extremities, no anasarca, dizziness, palpitations  GI  No heartburn, indigestion, abdominal pain, nausea, vomiting, diarrhea, change in bowel habits, loss of appetite  Resp: Notes  shortness of breath with exertion and  at rest.  Notes  excess mucus, notes  productive cough,  No  non-productive cough,  No coughing up of blood.  Notes  change in color of mucus.  No wheezing.  No chest wall deformity  Skin: no rash or  lesions.  GU: no dysuria, change in color of urine, no urgency or frequency.  No flank pain.  MS:  No joint pain or swelling.  No decreased range of motion.  No back pain.  Psych:  No change in mood or affect. No depression or anxiety.  No memory loss.      Objective:    Review of Systems  Constitutional:   No  weight loss, night sweats,  Fevers, chills, fatigue, lassitude. HEENT:   No headaches,  Difficulty swallowing,  Tooth/dental problems,  Sore throat,                No sneezing, itching, ear ache, nasal congestion, post nasal drip,   CV:  No chest pain,  Orthopnea, PND, swelling in lower extremities, anasarca, dizziness, palpitations  GI  No heartburn, indigestion, abdominal pain, nausea, vomiting, diarrhea, change in bowel habits, loss of appetite  Resp: Notes  shortness of breath with exertion not  at rest.  No excess mucus, no productive cough,  No non-productive cough,  No coughing up of blood.  No change in color of mucus.  No wheezing.  No chest wall deformity  Skin: no rash or lesions.  GU: no dysuria, change in color of urine, no urgency or frequency.  No flank pain.  MS:  No joint pain or swelling.  No decreased range of motion.  No back pain.  Psych:  No change in mood or affect. No depression or anxiety.  No memory loss.     Objective:   Physical Exam  Filed Vitals:   02/09/12 1107  BP: 90/66  Pulse: 79  Temp: 98.7 F (37.1 C)  TempSrc: Oral  Height: 4\' 11"  (1.499 m)  Weight: 164 lb 3.2 oz (74.481 kg)  SpO2: 94%    Gen: Pleasant, well-nourished, in no distress,  normal affect  ENT: No lesions,  mouth clear,  oropharynx clear, no postnasal drip  Neck: No JVD, no TMG, no carotid bruits  Lungs: No use of accessory muscles, no dullness to percussion, distant bs   Cardiovascular: RRR, heart sounds normal, no murmur or gallops, no peripheral edema  Abdomen: soft and NT, no HSM,  BS normal  Musculoskeletal: No deformities, no cyanosis or  clubbing  Neuro: alert, non focal  Skin: Warm, no lesions or rashes        Assessment & Plan:   No problem-specific assessment & plan notes found for this encounter.   Updated Medication List Outpatient Encounter Prescriptions as of 02/09/2012  Medication Sig Dispense Refill  . albuterol (PROVENTIL) (2.5 MG/3ML) 0.083% nebulizer solution Take 2.5 mg by nebulization 4 (four) times daily.        Marland Kitchen allopurinol (ZYLOPRIM) 100 MG tablet Take 100 mg by mouth 2 (two) times daily.        Marland Kitchen aspirin 81 MG tablet Take 81 mg by mouth daily.        Marland Kitchen atenolol (TENORMIN) 50 MG tablet Take 50 mg by mouth daily.        . budesonide (PULMICORT) 0.25 MG/2ML nebulizer solution Take 0.25 mg by nebulization 4 (four) times daily.       Marland Kitchen dextromethorphan (DELSYM) 30 MG/5ML liquid Take 60 mg by mouth every 12 (twelve) hours as needed.        Marland Kitchen epoetin alfa (EPOGEN,PROCRIT) 24401  UNIT/ML injection Inject 10,000 Units into the skin once a week.        . ferrous sulfate 325 (65 FE) MG tablet Take 325 mg by mouth 2 (two) times daily.        . hydrocortisone 2.5 % cream Apply topically as needed.      . insulin aspart (NOVOLOG) 100 UNIT/ML injection as directed. Take as directed      . insulin glargine (LANTUS) 100 UNIT/ML injection Inject 12 Units into the skin at bedtime.       . isosorbide mononitrate (IMDUR) 30 MG 24 hr tablet 1/2 daily      . lactulose, encephalopathy, (ENULOSE) 10 GM/15ML SOLN Take by mouth. 30 mLs by mouth daily as needed for constipation      . lidocaine (LIDODERM) 5 % Place 1 patch onto the skin daily. Remove & Discard patch within 12 hours or as directed by MD       . metaxalone (SKELAXIN) 800 MG tablet Take 800 mg by mouth as needed.      Marland Kitchen omeprazole (PRILOSEC) 20 MG capsule Take 20 mg by mouth daily.        . Polyvinyl Alcohol (AKWA TEARS OP) Apply 2 drops to eye 4 (four) times daily.       . rosuvastatin (CRESTOR) 20 MG tablet Take 20 mg by mouth daily.        .  sennosides-docusate sodium (SENOKOT-S) 8.6-50 MG tablet Take 1 tablet by mouth at bedtime.        Marland Kitchen spironolactone (ALDACTONE) 25 MG tablet Take 25 mg by mouth daily.       Marland Kitchen tiotropium (SPIRIVA) 18 MCG inhalation capsule Place 18 mcg into inhaler and inhale daily.        Marland Kitchen torsemide (DEMADEX) 20 MG tablet Take 40 mg by mouth daily.       Marland Kitchen DISCONTD: guaiFENesin (MUCINEX) 600 MG 12 hr tablet Take 1 tablet (600 mg total) by mouth 2 (two) times daily.  60 tablet  2  . DISCONTD: guaiFENesin (MUCINEX) 600 MG 12 hr tablet Take 600 mg by mouth 2 (two) times daily.      Marland Kitchen DISCONTD: loratadine (CLARITIN) 10 MG tablet Take 10 mg by mouth daily as needed.       Marland Kitchen DISCONTD: predniSONE (DELTASONE) 10 MG tablet Take 4 for four days 3 for four days 2 for four days 1 for four days  40 tablet  0  . DISCONTD: predniSONE (DELTASONE) 10 MG tablet Take 4 for four days 3 for four days 2 for four days 1 for four days

## 2012-02-09 NOTE — Progress Notes (Signed)
ANTIBIOTIC CONSULT NOTE - INITIAL  Pharmacy Consult for Vancomycin Indication: rule out pneumonia  Allergies  Allergen Reactions  . Codeine     REACTION: unspecified  . Doxycycline   . Guaifenesin & Derivatives   . Penicillins     REACTION: unspecified    Patient Measurements:   5/29 Reported height 59" and weight 73.5kg  Vital Signs: Temp: 98.7 F (37.1 C) (05/29 1107) Temp src: Oral (05/29 1107) BP: 90/66 mmHg (05/29 1107) Pulse Rate: 79  (05/29 1107)  Labs: No results found for this basename: WBC:3,HGB:3,PLT:3,LABCREA:3,CREATININE:3 in the last 72 hours 5/29 No SCr available  Microbiology: No results found for this or any previous visit (from the past 720 hour(s)).  Medical History: Past Medical History  Diagnosis Date  . COPD (chronic obstructive pulmonary disease)   . Diastolic congestive heart failure   . C. difficile colitis   . Hypertension   . Hyperlipidemia   . CAD (coronary artery disease)   . Anemia   . Type 2 diabetes mellitus   . Pacemaker 2002  . Cardiac arrest - asystole 2002  . Gallstones   . Pulmonary hypertension   . Renal insufficiency   . Osteoarthritis   . Allergic rhinitis   . Gout   . Stasis dermatitis   . Sinoatrial node dysfunction   . Syncope   . Postsurgical percutaneous transluminal coronary angioplasty status   . Dyslipidemia   . Acute bronchitis   . History of cholelithiasis   . Obesity     Medications:  Anti-infectives     Start     Dose/Rate Route Frequency Ordered Stop   02/09/12 1300   moxifloxacin (AVELOX) IVPB 400 mg        400 mg 250 mL/hr over 60 Minutes Intravenous Every 24 hours 02/09/12 1224     02/09/12 1215   cefTRIAXone (ROCEPHIN) 1 g in dextrose 5 % 50 mL IVPB  Status:  Discontinued        1 g 100 mL/hr over 30 Minutes Intravenous Every 12 hours 02/09/12 1214 02/09/12 1224         Assessment:  84 yoF, NH pt w/ hx of recurrent PNA, direct admit to ICU from pulm office.  Labs pending.  Last  SCr = 2 (11/08/11)  Goal of Therapy:  Vancomycin trough level 15-20 mcg/ml  Plan:   Vancomycin 1g IV loading dose  Vancomycin 750mg  IV q24h  Measure Vanc trough at steady state.  Follow up renal fxn and culture results.   Lynann Beaver PharmD, BCPS Pager [phone] 02/09/2012 12:30 PM

## 2012-02-09 NOTE — Assessment & Plan Note (Signed)
Prob PNA LLL Plan Admit to ICU [facility]

## 2012-02-09 NOTE — H&P (Addendum)
Name: [name] MRN: [id number] DOB: 03/13/1928    LOS:    PULMONARY / CRITICAL CARE MEDICINE  HPI:  78 y.o.F quit smoking in 1987 and chronic renal insufficiency with coronary artery disease. The patient had history of congestive heart failure and diabetes. The patient has been hospitalized on numerous occasions for respiratory failure and pneumonia. Patient currently is residing in a nursing care facility.    Presented to office 02/09/2012 for worsening cough, congestion and dyspnea. Hypotensive on arrival .  WHen coughs [name] lose the breath. No real chest pain. [name] notes some heaviness in the L chest.[name] notes fever.  No hemoptysis . Will require admission to ICU for further evaluation and treatment.   Past Medical History  Diagnosis Date  . COPD (chronic obstructive pulmonary disease)   . Diastolic congestive heart failure   . C. difficile colitis   . Hypertension   . Hyperlipidemia   . CAD (coronary artery disease)   . Anemia   . Type 2 diabetes mellitus   . Pacemaker 2002  . Cardiac arrest - asystole 2002  . Gallstones   . Pulmonary hypertension   . Renal insufficiency   . Osteoarthritis   . Allergic rhinitis   . Gout   . Stasis dermatitis   . Sinoatrial node dysfunction   . Syncope   . Postsurgical percutaneous transluminal coronary angioplasty status   . Dyslipidemia   . Acute bronchitis   . History of cholelithiasis   . Obesity    Past Surgical History  Procedure Date  . Ptca 2002  . Appendectomy   . Vesicovaginal fistula closure w/ tah   . Pacemaker placement 2002   Prior to Admission medications   Medication Sig Start Date End Date Taking? Authorizing Provider  albuterol (PROVENTIL) (2.5 MG/3ML) 0.083% nebulizer solution Take 2.5 mg by nebulization 4 (four) times daily.      Historical Provider, MD  allopurinol (ZYLOPRIM) 100 MG tablet Take 100 mg by mouth 2 (two) times daily.      Historical Provider, MD  aspirin 81 MG tablet Take 81 mg by mouth daily.       Historical Provider, MD  atenolol (TENORMIN) 50 MG tablet Take 50 mg by mouth daily.      Historical Provider, MD  budesonide (PULMICORT) 0.25 MG/2ML nebulizer solution Take 0.25 mg by nebulization 4 (four) times daily.     Historical Provider, MD  dextromethorphan (DELSYM) 30 MG/5ML liquid Take 60 mg by mouth every 12 (twelve) hours as needed.      Historical Provider, MD  epoetin alfa (EPOGEN,PROCRIT) 82956 UNIT/ML injection Inject 10,000 Units into the skin once a week.      Historical Provider, MD  ferrous sulfate 325 (65 FE) MG tablet Take 325 mg by mouth 2 (two) times daily.      Historical Provider, MD  hydrocortisone 2.5 % cream Apply topically as needed.    Historical Provider, MD  insulin aspart (NOVOLOG) 100 UNIT/ML injection as directed. Take as directed    Historical Provider, MD  insulin glargine (LANTUS) 100 UNIT/ML injection Inject 12 Units into the skin at bedtime.     Historical Provider, MD  isosorbide mononitrate (IMDUR) 30 MG 24 hr tablet 1/2 daily    Historical Provider, MD  lactulose, encephalopathy, (ENULOSE) 10 GM/15ML SOLN Take by mouth. 30 mLs by mouth daily as needed for constipation    Historical Provider, MD  lidocaine (LIDODERM) 5 % Place 1 patch onto the skin daily. Remove & Discard  patch within 12 hours or as directed by MD     Historical Provider, MD  metaxalone (SKELAXIN) 800 MG tablet Take 800 mg by mouth as needed.    Historical Provider, MD  omeprazole (PRILOSEC) 20 MG capsule Take 20 mg by mouth daily.      Historical Provider, MD  Polyvinyl Alcohol (AKWA TEARS OP) Apply 2 drops to eye 4 (four) times daily.     Historical Provider, MD  rosuvastatin (CRESTOR) 20 MG tablet Take 20 mg by mouth daily.      Historical Provider, MD  sennosides-docusate sodium (SENOKOT-S) 8.6-50 MG tablet Take 1 tablet by mouth at bedtime.      Historical Provider, MD  spironolactone (ALDACTONE) 25 MG tablet Take 25 mg by mouth daily.     Historical Provider, MD  tiotropium  (SPIRIVA) 18 MCG inhalation capsule Place 18 mcg into inhaler and inhale daily.      Historical Provider, MD  torsemide (DEMADEX) 20 MG tablet Take 40 mg by mouth daily.     Historical Provider, MD   Allergies Allergies  Allergen Reactions  . Codeine     REACTION: unspecified  . Doxycycline   . Guaifenesin & Derivatives   . Penicillins     REACTION: unspecified    Family History Family History  Problem Relation Age of Onset  . Diabetes Mother   . Hypertension Mother   . Heart disease Father    Social History  reports that [name] quit smoking about 26 years ago. Her smoking use included Cigarettes. [name] has a 50 pack-year smoking history. [name] has never used smokeless tobacco. [name] reports that [name] does not drink alcohol. Her drug history not on file.  Review Of Systems:   Constitutional:   No  weight loss, night sweats,   +Fevers, chills, fatigue, or  lassitude.  HEENT:   No headaches,  Difficulty swallowing,  Tooth/dental problems, or  Sore throat,                No sneezing, itching, ear ache, nasal congestion, post nasal drip,   CV:  No chest pain,  Orthopnea, PND, swelling in lower extremities, anasarca, dizziness, palpitations, syncope.   GI  No heartburn, indigestion, abdominal pain, nausea, vomiting, diarrhea, change in bowel habits, loss of appetite, bloody stools.   Resp: N  No coughing up of blood.     No chest wall deformity  Skin: no rash or lesions.  GU: no dysuria, change in color of urine, no urgency or frequency.  No flank pain, no hematuria   MS:  No joint pain or swelling.  No decreased range of motion.  Psych:  No change in mood or affect. No depression or anxiety.  No memory loss.       Brief patient description: 78 yo NH female with known hx of COPD-chronic O2 , CHF and  recurrent PNA  presented to OP clinic with worsening dyspnea , cough and fever. Hypotensive on arrival to office. Admitted to ICU for possible shock and PNA .   Events Since  Admission:  Current Status: guarded   Vital Signs: Temp:  [98.7 F (37.1 C)] 98.7 F (37.1 C) (05/29 1107) Pulse Rate:  [79] 79  (05/29 1107) BP: (90)/(66) 90/66 mmHg (05/29 1107) SpO2:  [94 %] 94 % (05/29 1107) Weight:  [74.481 kg (164 lb 3.2 oz)] 74.481 kg (164 lb 3.2 oz) (05/29 1107)  Physical Examination: Gen: Pleasant, elderly in wheelchair weak  ENT:  oropharynx clear, no postnasal  drip  Neck: No JVD, no TMG, no carotid bruits  Lungs: No use of accessory muscles, no dullness to percussion, coarse BS  Cardiovascular: RRR, heart sounds normal, no murmur or gallops, tr peripheral edema  Abdomen: soft and NT, no HSM, BS normal  Musculoskeletal: No deformities, no cyanosis or clubbing  Neuro: alert, non focal  Skin: Warm, no lesions or rashes   Active Problems:     ASSESSMENT AND PLAN  PULMONARY No results found for this basename: PHART:5,PCO2:5,PCO2ART:5,PO2ART:5,HCO3:5,O2SAT:5 in the last 168 hours     CXR:  Pending     A:  Recurrent PNA -in NH patient  Hx of COPD and chronic Resp Failure -o2 dependent  P:  Begin IV abx w/ vanc and rocephin   Pan cx  Pulmonary hygiene regimen.  Check xray     CARDIOVASCULAR No results found for this basename: TROPONINI:5,LATICACIDVEN:5, O2SATVEN:5,PROBNP:5 in the last 168 hours ECG: pending  Lines:   A: Hx of diastolic CHF - last echo 2009 w/ EF of 60%  Hypotension  Shock  Hx of HTN    P:  cXR pending  Check bnp and ekg  IVF bolus 500cc x 1 -caution with hx of CHF  Hold antihypertensive meds and diuretics   RENAL No results found for this basename: NA:5,K:2,CL:5,CO2:5,BUN:5,CREATININE:5,CALCIUM:5,MG:5,PHOS:5 in the last 168 hours Intake/Output    None      A: no known active issues  P:  Check bmet    GASTROINTESTINAL No results found for this basename: AST:5,ALT:5,ALKPHOS:5,BILITOT:5,PROT:5,ALBUMIN:5 in the last 168 hours  A:  Hx of GERD  P:  Cont PPI    HEMATOLOGIC No results found for this  basename: HGB:5,HCT:5,PLT:5,INR:5,APTT:5 in the last 168 hours A:  Hx of Anemia   P: cont iron rx -home med   INFECTIOUS No results found for this basename: WBC:5,PROCALCITON:5 in the last 168 hours Cultures: 02/09/2012 BC x 2  02/09/2012 Sputum   Antibiotics: 02/09/2012 Rocephin  ( HCPNA )  02/09/2012 Vanc ( HCPNA )   A:  Suspected HC-PNA  P:  IV abx  Fluid bolus  Pan cx   ENDOCRINE No results found for this basename: GLUCAP:5 in the last 168 hours A:  Hx of DM  P:  SSI Insulin  Hold lantus until labs return , restart as indicated.    NEUROLOGIC  A: no known active issues  P:   Cont to monitor    BEST PRACTICE / DISPOSITION - Level of Care:  ICU  - Primary Service:  PCCM  - Consultants:   - Code Status:  Full  - Diet:  Heart healthy  - DVT Px:  Hep  - GI Px:  PPI  - Skin Integrity:  Intact  - Social / FamilyRubye Oaks, NP  Pulmonary and Critical Care Medicine  HealthCare Pager: [phone]  02/09/2012, 11:25 AM  I have seen and examined this patient with Rubye Oaks I agree with this patient Austin Miles  [phone]  Cell  [phone]  If no response or cell goes to voicemail, call beeper [phone]   02/09/2012  255PM Upon further review, the [name] BP is normal. [name] does not appear septic. [name] has bibasilar PNA. [name] can go to Non tele bed.  Shan Levans Beeper  [phone]  Cell  [phone]  If no response or cell goes to voicemail, call beeper [phone]

## 2012-02-10 ENCOUNTER — Inpatient Hospital Stay (HOSPITAL_COMMUNITY): Payer: Medicare Other

## 2012-02-10 ENCOUNTER — Encounter (HOSPITAL_COMMUNITY): Payer: Self-pay | Admitting: General Practice

## 2012-02-10 DIAGNOSIS — N390 Urinary tract infection, site not specified: Secondary | ICD-10-CM | POA: Diagnosis present

## 2012-02-10 DIAGNOSIS — J449 Chronic obstructive pulmonary disease, unspecified: Secondary | ICD-10-CM

## 2012-02-10 LAB — URINALYSIS, ROUTINE W REFLEX MICROSCOPIC
Bilirubin Urine: NEGATIVE
Ketones, ur: NEGATIVE mg/dL
Nitrite: NEGATIVE
Protein, ur: NEGATIVE mg/dL
Specific Gravity, Urine: 1.02 (ref 1.005–1.030)
Urobilinogen, UA: 0.2 mg/dL (ref 0.0–1.0)

## 2012-02-10 LAB — GLUCOSE, CAPILLARY
Glucose-Capillary: 94 mg/dL (ref 70–99)
Glucose-Capillary: 183 mg/dL — ABNORMAL HIGH (ref 70–99)
Glucose-Capillary: 128 mg/dL — ABNORMAL HIGH (ref 70–99)

## 2012-02-10 LAB — BASIC METABOLIC PANEL
CO2: 26 mEq/L (ref 19–32)
Calcium: 8.5 mg/dL (ref 8.4–10.5)
Chloride: 104 mEq/L (ref 96–112)
Creatinine, Ser: 2.47 mg/dL — ABNORMAL HIGH (ref 0.50–1.10)
Glucose, Bld: 121 mg/dL — ABNORMAL HIGH (ref 70–99)

## 2012-02-10 LAB — CBC
HCT: 30.6 % — ABNORMAL LOW (ref 36.0–46.0)
Hemoglobin: 9.5 g/dL — ABNORMAL LOW (ref 12.0–15.0)
MCH: 26.5 pg (ref 26.0–34.0)
MCV: 85.2 fL (ref 78.0–100.0)
RBC: 3.59 MIL/uL — ABNORMAL LOW (ref 3.87–5.11)

## 2012-02-10 LAB — URINE MICROSCOPIC-ADD ON

## 2012-02-10 LAB — CORTISOL: Cortisol, Plasma: 13.1 ug/dL

## 2012-02-10 MED ORDER — INSULIN GLARGINE 100 UNIT/ML ~~LOC~~ SOLN: 10.0000 [IU] | Freq: Every day | SUBCUTANEOUS | Status: DC

## 2012-02-10 MED ORDER — LEVOFLOXACIN IN D5W 500 MG/100ML IV SOLN
500.0000 mg | INTRAVENOUS | Status: DC
  Administered 2012-02-11: 500 mg via INTRAVENOUS
  Filled 2012-02-10: qty 100

## 2012-02-10 NOTE — Progress Notes (Signed)
ANTIBIOTIC CONSULT NOTE - INITIAL  Pharmacy Consult for Vancomycin, Levaquin Indication: rule out pneumonia  Allergies  Allergen Reactions  . Codeine     REACTION: unspecified  . Doxycycline   . Guaifenesin & Derivatives   . Penicillins     REACTION: unspecified    Patient Measurements: Height: 4\' 11"  (149.9 cm) Weight: 173 lb 4.5 oz (78.6 kg) (bed scale) IBW/kg (Calculated) : 43.2  5/29 Reported height 59" and weight 73.5kg  Vital Signs: Temp: 98.9 F (37.2 C) (05/30 0614) Temp src: Oral (05/30 0614) BP: 126/48 mmHg (05/30 0614) Pulse Rate: 72  (05/30 0614)  Labs:  Basename 02/10/12 0440 02/09/12 1245  WBC 11.5* 14.9*  HGB 9.5* 10.7*  PLT 207 240  LABCREA -- --  CREATININE 2.47* 2.34*    Microbiology: Recent Results (from the past 720 hour(s))  CULTURE, BLOOD (ROUTINE X 2)     Status: Normal (Preliminary result)   Collection Time   02/09/12 12:45 PM      Component Value Range Status Comment   Specimen Description BLOOD RIGHT ARM   Final    Special Requests BOTTLES DRAWN AEROBIC AND ANAEROBIC 4CC EACH   Final    Culture  Setup Time [id number]   Final    Culture     Final    Value:        BLOOD CULTURE RECEIVED NO GROWTH TO DATE CULTURE WILL BE HELD FOR 5 DAYS BEFORE ISSUING A FINAL NEGATIVE REPORT   Report Status PENDING   Incomplete   CULTURE, BLOOD (ROUTINE X 2)     Status: Normal (Preliminary result)   Collection Time   02/09/12  1:00 PM      Component Value Range Status Comment   Specimen Description BLOOD RIGHT HAND   Final    Special Requests BOTTLES DRAWN AEROBIC AND ANAEROBIC [facility] EACH   Final    Culture  Setup Time [id number]   Final    Culture     Final    Value:        BLOOD CULTURE RECEIVED NO GROWTH TO DATE CULTURE WILL BE HELD FOR 5 DAYS BEFORE ISSUING A FINAL NEGATIVE REPORT   Report Status PENDING   Incomplete   MRSA PCR SCREENING     Status: Normal   Collection Time   02/09/12  1:21 PM      Component Value Range Status Comment   MRSA by  PCR NEGATIVE  NEGATIVE  Final     Medical History: Past Medical History  Diagnosis Date  . COPD (chronic obstructive pulmonary disease)   . Diastolic congestive heart failure   . C. difficile colitis   . Hypertension   . Hyperlipidemia   . CAD (coronary artery disease)   . Anemia   . Type 2 diabetes mellitus   . Pacemaker 2002  . Cardiac arrest - asystole 2002  . Gallstones   . Pulmonary hypertension   . Renal insufficiency   . Osteoarthritis   . Allergic rhinitis   . Gout   . Stasis dermatitis   . Sinoatrial node dysfunction   . Syncope   . Postsurgical percutaneous transluminal coronary angioplasty status   . Dyslipidemia   . Acute bronchitis   . History of cholelithiasis   . Obesity     Medications:  Anti-infectives     Start     Dose/Rate Route Frequency Ordered Stop   02/10/12 1400   vancomycin (VANCOCIN) 750 mg in sodium chloride 0.9 % 150 mL IVPB  750 mg 150 mL/hr over 60 Minutes Intravenous Every 24 hours 02/09/12 1251     02/09/12 1400   vancomycin (VANCOCIN) IVPB 1000 mg/200 mL premix        1,000 mg 200 mL/hr over 60 Minutes Intravenous  Once 02/09/12 1250 02/09/12 1702   02/09/12 1300   moxifloxacin (AVELOX) IVPB 400 mg  Status:  Discontinued        400 mg 250 mL/hr over 60 Minutes Intravenous Every 24 hours 02/09/12 1224 02/10/12 1404   02/09/12 1215   cefTRIAXone (ROCEPHIN) 1 g in dextrose 5 % 50 mL IVPB  Status:  Discontinued        1 g 100 mL/hr over 30 Minutes Intravenous Every 12 hours 02/09/12 1214 02/09/12 1224         Assessment:  84 yoF, NH pt w/ hx of recurrent PNA, direct admit to ICU from pulm office.  Started empiric Vanc and Avelox.  Now suspect UTI so Avelox switched to Levaquin. Received a dose of Avelox today.  SCr elevated, CrCl 43ml/min  Goal of Therapy:  Vancomycin trough level 15-20 mcg/ml  Plan:   Levaquin 500mg  IV q48h, starting tomorrow.  Cont Vanc 750mg  IV q24h  Measure Vanc trough at steady  state.  Follow up renal fxn and culture results.  Charolotte Eke, PharmD, pager [phone]. 02/10/2012,2:35 PM.

## 2012-02-10 NOTE — Progress Notes (Addendum)
Name: [name] MRN: [id number] DOB: 10-Apr-1928    LOS: 1   PULMONARY / CRITICAL CARE MEDICINE  HPI:  78 y.o. WF with HCAP and UTI , Hx copd    Events Since Admission: Pt feels better. Less congested.  Current Status:  Vital Signs: Temp:  [98.2 F (36.8 C)-98.9 F (37.2 C)] 98.9 F (37.2 C) (05/30 0614) Pulse Rate:  [69-74] 72  (05/30 0614) Resp:  [15-24] 18  (05/30 0614) BP: (115-133)/(39-70) 126/48 mmHg (05/30 0614) SpO2:  [95 %-100 %] 96 % (05/30 0931) FiO2 (%):  [2 %] 2 % (05/29 1948) Weight:  [78.6 kg (173 lb 4.5 oz)] 78.6 kg (173 lb 4.5 oz) (05/29 2200)  Physical Examination: General:  Awake and alert, no distress  Neuro:  Moves all 4s , no deficits HEENT:  Mucus membranes moist, no lesions Neck:  Neck supple, no TMG, no JVD Cardiovascular:  RRR nl s1/s2  No s3 Lungs:  Bibasilar rhonchi Abdomen:  Distended, bsa, no hsm Musculoskeletal:  from Skin:  clear  Active Problems:  DIABETES MELLITUS, TYPE II  HYPERTENSION  CHRONIC OBSTRUCTIVE PULMONARY DISEASE, ACUTE EXACERBATION  CKD (chronic kidney disease) stage 4, GFR 15-29 ml/min  Pneumonia  UTI (lower urinary tract infection)   ASSESSMENT AND PLAN  PULMONARY No results found for this basename: PHART:5,PCO2:5,PCO2ART:5,PO2ART:5,HCO3:5,O2SAT:5 in the last 168 hours CXR:  Bibasilar infiltrates ETT:  none  A:  HCAP with acute on chronic respiratory failure and COPD exac P:   Cont avelox/vanco BD neb meds Titrate oxygen F/u CXR  CARDIOVASCULAR  Lab 02/09/12 1245  TROPONINI <0.30  LATICACIDVEN 0.6  PROBNP 1305.0*    Lines: none  A: BP stable.  Hx HTN  P:  Held nitrates.  Monitor  RENAL  Lab 02/10/12 0440 02/09/12 1245  NA 141 139  K 4.0 4.2  CL 104 100  CO2 26 24  BUN 69* 72*  CREATININE 2.47* 2.34*  CALCIUM 8.5 9.1  MG -- --  PHOS -- --   Intake/Output      05/29 0701 - 05/30 0700 05/30 0701 - 05/31 0700   P.O.  240   IV Piggyback 150    Total Intake(mL/kg) 150 (1.9) 240  (3.1)   Urine (mL/kg/hr) 200 (0.1) 400 (0.7)   Total Output 200 400   Net -50 -160        Urine Occurrence 2 x 1 x    Foley:  none  A:  Moderate CKD Stage III P:   Monitor bmet  GASTROINTESTINAL  Lab 02/09/12 1245  AST 33  ALT 35  ALKPHOS 81  BILITOT 0.2*  PROT 7.1  ALBUMIN 2.8*    A:  obesity P:   Low carb diet  HEMATOLOGIC  Lab 02/10/12 0440 02/09/12 1245  HGB 9.5* 10.7*  HCT 30.6* 33.6*  PLT 207 240  INR -- --  APTT -- --   A:  Mild anemia suspect iron def, leukocytosis d/t UTI HCAP P:  Monitor CBC  INFECTIOUS  Lab 02/10/12 0440 02/09/12 1245  WBC 11.5* 14.9*  PROCALCITON -- 0.37   Cultures: 5/29 BCx2>>> 5/30 UC>>>  Antibiotics: avelox (hcap)5/ 29>> Vanco (hcap) 5/29>>  A:  UTI and HCAP P:   Change avelox to levaquin d/t UTI and PNA, cont vanco  ENDOCRINE  Lab 02/10/12 1138 02/10/12 0724 02/10/12 0022 02/09/12 2208 02/09/12 1658  GLUCAP 183* 94 151* 88 196*   A:  Fair glycemic control   P:   lantus 10units QHS Cont SSI  NEUROLOGIC  A:  No active neuro issues P:   monitor  BEST PRACTICE / DISPOSITION - Level of Care:  Med Surg - Primary Service:  PCCM - Consultants:  none - Code Status:  Full - Diet:  Low carb - DVT Px:  Hep sq - GI Px:  PPI - Skin Integrity:  good - Social / Family:  None present  Shan Levans, M.D. Pulmonary and Critical Care Medicine [facility] Pager: [phone]  02/10/2012, 2:06 PM

## 2012-02-11 ENCOUNTER — Inpatient Hospital Stay (HOSPITAL_COMMUNITY): Payer: Medicare Other

## 2012-02-11 DIAGNOSIS — J9620 Acute and chronic respiratory failure, unspecified whether with hypoxia or hypercapnia: Secondary | ICD-10-CM | POA: Diagnosis present

## 2012-02-11 DIAGNOSIS — J441 Chronic obstructive pulmonary disease with (acute) exacerbation: Secondary | ICD-10-CM

## 2012-02-11 LAB — BASIC METABOLIC PANEL
CO2: 25 mEq/L (ref 19–32)
Calcium: 9 mg/dL (ref 8.4–10.5)
Creatinine, Ser: 2.08 mg/dL — ABNORMAL HIGH (ref 0.50–1.10)
GFR calc Af Amer: 24 mL/min — ABNORMAL LOW (ref >90)
GFR calc non Af Amer: 21 mL/min — ABNORMAL LOW (ref >90)

## 2012-02-11 LAB — GLUCOSE, CAPILLARY: Glucose-Capillary: 150 mg/dL — ABNORMAL HIGH (ref 70–99)

## 2012-02-11 LAB — CBC
MCH: 26.5 pg (ref 26.0–34.0)
MCHC: 30.7 g/dL (ref 30.0–36.0)
MCV: 86.5 fL (ref 78.0–100.0)
Platelets: 222 10*3/uL (ref 150–400)
RDW: 15.3 % (ref 11.5–15.5)

## 2012-02-11 MED ORDER — LEVOFLOXACIN 750 MG PO TABS: 750.0000 mg | ORAL_TABLET | Freq: Every day | ORAL | Status: AC

## 2012-02-11 MED ORDER — ALBUTEROL SULFATE (5 MG/ML) 0.5% IN NEBU: 2.5000 mg | INHALATION_SOLUTION | RESPIRATORY_TRACT | Status: DC | PRN

## 2012-02-11 MED ORDER — ALBUTEROL SULFATE (2.5 MG/3ML) 0.083% IN NEBU: 2.5000 mg | INHALATION_SOLUTION | Freq: Four times a day (QID) | RESPIRATORY_TRACT | Status: DC

## 2012-02-11 NOTE — Discharge Summary (Signed)
Physician Discharge Summary     Patient ID: [name] MRN: [id number] DOB/AGE: 1928-06-26 78 y.o.  Admit date: 02/09/2012 Discharge date: 02/11/2012  Admission Diagnoses: Pneumonia and hypotension.  Discharge Diagnoses:  Active Problems:  DIABETES MELLITUS, TYPE II  HYPERTENSION  CHRONIC OBSTRUCTIVE PULMONARY DISEASE, ACUTE EXACERBATION  CKD (chronic kidney disease) stage 4, GFR 15-29 ml/min  Pneumonia  UTI (lower urinary tract infection)  Acute-on-chronic respiratory failure   Significant Hospital tests/ studies/ interventions and procedures   Hospital Course:  Acute on Chronic Respiratory failure in the setting of HCAP (NOS). Initially admitted to ICU due to transient hypotension in our office. . Transferred to medical ward later that afternoon. Treated in usual fashion w/ IVFs and empiric antibiotics. All culture data negative to date.  Plan: She will be discharged to SNF with plan to complete 10d total rx. Will transition to high dose levaquin.  F/u with Dr Delford Field in June 12 at 4pm.   Transient Hypotension. This was observed only in our office and not in the hospital. Her antihypertensives were held. She was given IVFs.   HTN Plan: -resume her home meds  Stage III CKD. Scr has remained stable in mid 2 range.  Plan: -home on her home rx  Obesity Plan: Low carb diet  Mild anemia (chronic disease) Plan -monitor routinely in out-pt setting  UTI. Cultures pending Plan: -to SNF on levaquin -will need f/u on UC  DM Plan: -resume her home insulin rx.   Discharge Exam: BP 133/45  Pulse 68  Temp(Src) 97.3 F (36.3 C) (Oral)  Resp 18  Ht 4\' 11"  (1.499 m)  Wt 74.8 kg (164 lb 14.5 oz)  BMI 33.31 kg/m2  SpO2 98% 2 liters n/c General: Awake and alert, no distress  Neuro: Moves all 4s , no deficits  HEENT: Mucus membranes moist, no lesions  Neck: Neck supple, no TMG, no JVD  Cardiovascular: RRR nl s1/s2 No s3  Lungs: Bibasilar crackles  Abdomen:  Distended, bsa, no hsm  Musculoskeletal: from  Skin: clear    Labs at discharge Lab Results  Component Value Date   CREATININE 2.08* 02/11/2012   BUN 57* 02/11/2012   NA 141 02/11/2012   K 4.1 02/11/2012   CL 106 02/11/2012   CO2 25 02/11/2012   Lab Results  Component Value Date   WBC 10.6* 02/11/2012   HGB 9.6* 02/11/2012   HCT 31.3* 02/11/2012   MCV 86.5 02/11/2012   PLT 222 02/11/2012   Lab Results  Component Value Date   ALT 35 02/09/2012   AST 33 02/09/2012   ALKPHOS 81 02/09/2012   BILITOT 0.2* 02/09/2012   Lab Results  Component Value Date   INR 1.1* 04/27/2011   INR 0.98 07/09/2009   INR 1.4 02/17/2007    Current radiology studies Dg Chest 2 View  02/11/2012  *RADIOLOGY REPORT*  Clinical Data: Pneumonia, history COPD  CHEST - 2 VIEW  Comparison: Chest radiograph 02/10/2012  Findings: Left-sided pacemaker with continuous leads overlies stable mildly enlarged heart silhouette.  There is chronic atelectasis at the left lung base with small effusion. Mild air space disease in the lower lobe is unchanged.  Right lung is clear. No pneumothorax.  IMPRESSION: Chronic atelectasis with superimposed effusion and airspace disease at the left lung base.  No significant change.  Original Report Authenticated By: Genevive Bi, M.D.   Portable Chest Xray In Am  02/10/2012  *RADIOLOGY REPORT*  Clinical Data: Pneumonia.  Dyspnea.  PORTABLE CHEST - 1 VIEW  Comparison: 1 day prior  Findings: The chin overlies the apices. Pacer with leads at right atrium and right ventricle.  No lead discontinuity.  Cardiomegaly accentuated by AP portable technique.  Small left pleural effusion is not significantly changed. No pneumothorax.  Low lung volumes with resultant pulmonary interstitial prominence.  Similar left and slight increase in right base air space disease.  IMPRESSION:  1.  Slight worsening right base aeration.  Favor atelectasis. 2.  Similar left base air space disease and small left pleural  effusion.  Original Report Authenticated By: Consuello Bossier, M.D.   Portable Chest Xray  02/09/2012  *RADIOLOGY REPORT*  Clinical Data: Fever, cough, shortness of breath and congestion.  PORTABLE CHEST - 1 VIEW  Comparison: 10/12/2011.  Findings: Trachea is midline.  Heart size stable.  Biapical pleural thickening.  Lungs are low in volume with bibasilar air space disease, left greater than right.  Possible small left pleural effusion.  Pacemaker lead tips project over the right atrium and right ventricle.  IMPRESSION: Bibasilar air space disease, left greater than right.  Possible left pleural effusion.  Original Report Authenticated By: Reyes Ivan, M.D.    Disposition:  01-Home or Self Care  Discharge Orders    Future Appointments: Provider: Department: Dept Phone: Center:   02/23/2012 4:00 PM Storm Frisk, MD [facility] [phone] None   03/03/2012 2:00 PM Rollene Rotunda, MD [facility] [phone] [facility]     Future Orders Please Complete By Expires   Diet - low sodium heart healthy      Scheduling Instructions:   Carb modified.   Increase activity slowly        Medication List  As of 02/11/2012 12:01 PM   STOP taking these medications         moxifloxacin 400 MG tablet         TAKE these medications         acidophilus Caps   Take 1 capsule by mouth 2 (two) times daily.      AKWA TEARS OP   Apply 2 drops to eye 4 (four) times daily.      albuterol (5 MG/ML) 0.5% nebulizer solution   Commonly known as: PROVENTIL   Take 0.5 mLs (2.5 mg total) by nebulization every 4 (four) hours as needed for wheezing or shortness of breath.      albuterol (2.5 MG/3ML) 0.083% nebulizer solution   Commonly known as: PROVENTIL   Take 3 mLs (2.5 mg total) by nebulization 4 (four) times daily.      allopurinol 100 MG tablet   Commonly known as: ZYLOPRIM   Take 100 mg by mouth 2 (two) times daily.      aspirin 81 MG tablet   Take 81 mg by mouth daily.       atenolol 50 MG tablet   Commonly known as: TENORMIN   Take 50 mg by mouth daily.      budesonide 0.25 MG/2ML nebulizer solution   Commonly known as: PULMICORT   Take 0.25 mg by nebulization 4 (four) times daily.      dextromethorphan 30 MG/5ML liquid   Commonly known as: DELSYM   Take 60 mg by mouth every 12 (twelve) hours as needed.      epoetin alfa 19147 UNIT/ML injection   Commonly known as: EPOGEN,PROCRIT   Inject 10,000 Units into the skin once a week.      ferrous sulfate 325 (65 FE) MG tablet   Take 325 mg  by mouth 2 (two) times daily.      hydrocortisone 2.5 % cream   Apply topically as needed.      insulin aspart 100 UNIT/ML injection   Commonly known as: novoLOG   Inject 2-10 Units into the skin 3 (three) times daily before meals. Per sliding scale      isosorbide mononitrate 30 MG 24 hr tablet   Commonly known as: IMDUR   Take 15 mg by mouth daily.      LANTUS 100 UNIT/ML injection   Generic drug: insulin glargine   Inject 12 Units into the skin at bedtime.      levofloxacin 750 MG tablet   Commonly known as: LEVAQUIN   Take 1 tablet (750 mg total) by mouth daily.      omeprazole 20 MG capsule   Commonly known as: PRILOSEC   Take 20 mg by mouth daily.      rosuvastatin 20 MG tablet   Commonly known as: CRESTOR   Take 20 mg by mouth daily.      sennosides-docusate sodium 8.6-50 MG tablet   Commonly known as: SENOKOT-S   Take 1 tablet by mouth at bedtime.      spironolactone 25 MG tablet   Commonly known as: ALDACTONE   Take 12.5 mg by mouth daily.      tiotropium 18 MCG inhalation capsule   Commonly known as: SPIRIVA   Place 18 mcg into inhaler and inhale daily.      torsemide 20 MG tablet   Commonly known as: DEMADEX   Take 20 mg by mouth daily.           Follow-up Information    Follow up with Shan Levans, MD on 02/23/2012. (4pm )    Contact information:   [address] [facility] [address] [place] [address] [place]  [address] [phone]          Discharged Condition: fair, feels that she is at baseline.   Physician Statement:   The Patient was personally examined, the discharge assessment and plan has been personally reviewed and I agree with ACNP Babcock's assessment and plan. > 30 minutes of time have been dedicated to discharge assessment, planning and discharge instructions.   Signed: BABCOCK,PETE 02/11/2012, 12:01 PM   This pt was seen and examined and I agree with the above DC noted Austin Miles  [phone]  Cell  [phone]  If no response or cell goes to voicemail, call beeper [phone]

## 2012-02-11 NOTE — Progress Notes (Signed)
Clinical Social Work Department BRIEF PSYCHOSOCIAL ASSESSMENT 02/11/2012  Patient:  [name], [name]     Account Number:  [id number]     Admit date:  02/09/2012  Clinical Social Worker:  Doroteo Glassman  Date/Time:  02/11/2012 02:36 PM  Referred by:  Physician  Date Referred:  02/11/2012 Referred for SNF Placement  Other Referral:   Interview type:  Patient Other interview type:    PSYCHOSOCIAL DATA Living Status:  FACILITY Admitted from facility:  [facility] Level of care:  Skilled Nursing Facility Primary support name:  Facility Primary support relationship to patient:  NONE Degree of support available:   Strong.  Pt resides at [facility].   CURRENT CONCERNS Current Concerns Post-Acute Placement  Other Concerns:    SOCIAL WORK ASSESSMENT / PLAN Pt from [facility] and is happy to return.  Facility happy to accept Pt back.  Sent d/c summary via [facility].  Facility ready to accept Pt.  Arranged for transportation  Pt to be d/c'd.  Assessment/plan status:  No Further Intervention Required Other assessment/ plan:   Information/referral to community resources:    PATIENT'S/FAMILY'S RESPONSE TO PLAN OF CARE: Pt thanked CSW for time and assistance.  Providence Crosby, LCSWA Clinical Social Work [phone]

## 2012-02-12 LAB — URINE CULTURE
Colony Count: NO GROWTH
Culture: NO GROWTH
Special Requests: NORMAL

## 2012-02-15 LAB — CULTURE, BLOOD (ROUTINE X 2): Culture: NO GROWTH

## 2012-02-23 ENCOUNTER — Encounter: Payer: Self-pay | Admitting: Critical Care Medicine

## 2012-02-23 ENCOUNTER — Ambulatory Visit (INDEPENDENT_AMBULATORY_CARE_PROVIDER_SITE_OTHER): Payer: Medicare Other | Admitting: Critical Care Medicine

## 2012-02-23 VITALS — BP 112/58 | HR 73 | Temp 98.5°F | Ht 59.0 in | Wt 166.0 lb

## 2012-02-23 DIAGNOSIS — J449 Chronic obstructive pulmonary disease, unspecified: Secondary | ICD-10-CM

## 2012-02-23 NOTE — Patient Instructions (Addendum)
No change in medications. Return in        2 months 

## 2012-02-23 NOTE — Progress Notes (Signed)
Subjective:    Patient ID: [name], female    DOB: 1928-06-28, 78 y.o.   MRN: [id number]  HPI  78 y.o.F quit smoking in 1987 and chronic renal insufficiency with coronary artery disease. The patient had history of congestive heart failure and diabetes. The patient has been hospitalized on numerous occasions for respiratory failure and pneumonia. Patient currently is residing in a nursing care facility.     02/23/2012 Pt in hosp 5/29- 5/31 for RLL PNA.  D/c on levaquin and finished same after 10days.   Cough is better, mucus is white.  No real chest pain. Dyspnea is to baseline. Pt denies any significant sore throat, nasal congestion or excess secretions, fever, chills, sweats, unintended weight loss, pleurtic or exertional chest pain, orthopnea PND, or leg swelling Pt denies any increase in rescue therapy over baseline, denies waking up needing it or having any early am or nocturnal exacerbations of coughing/wheezing/or dyspnea. Pt also denies any obvious fluctuation in symptoms with  weather or environmental change or other alleviating or aggravating factors          Objective:    Review of Systems  Constitutional:   No  weight loss, night sweats,  Fevers, chills, fatigue, lassitude. HEENT:   No headaches,  Difficulty swallowing,  Tooth/dental problems,  Sore throat,                No sneezing, itching, ear ache, nasal congestion, post nasal drip,   CV:  No chest pain,  Orthopnea, PND, swelling in lower extremities, anasarca, dizziness, palpitations  GI  No heartburn, indigestion, abdominal pain, nausea, vomiting, diarrhea, change in bowel habits, loss of appetite  Resp: Notes  shortness of breath with exertion not  at rest.  No excess mucus, no productive cough,  No non-productive cough,  No coughing up of blood.  No change in color of mucus.  No wheezing.  No chest wall deformity  Skin: no rash or lesions.  GU: no dysuria, change in color of urine, no urgency or  frequency.  No flank pain.  MS:  No joint pain or swelling.  No decreased range of motion.  No back pain.  Psych:  No change in mood or affect. No depression or anxiety.  No memory loss.     Objective:   Physical Exam  Filed Vitals:   02/23/12 1600  BP: 112/58  Pulse: 73  Temp: 98.5 F (36.9 C)  TempSrc: Oral  Height: 4\' 11"  (1.499 m)  Weight: 166 lb (75.297 kg)  SpO2: 97%    Gen: Pleasant, well-nourished, in no distress,  normal affect  ENT: No lesions,  mouth clear,  oropharynx clear, no postnasal drip  Neck: No JVD, no TMG, no carotid bruits  Lungs: No use of accessory muscles, no dullness to percussion, distant bs   Cardiovascular: RRR, heart sounds normal, no murmur or gallops, no peripheral edema  Abdomen: soft and NT, no HSM,  BS normal  Musculoskeletal: No deformities, no cyanosis or clubbing  Neuro: alert, non focal  Skin: Warm, no lesions or rashes        Assessment & Plan:   COPD Chronic obstructive lung disease stage D. oxygen dependent Recent right lower lobe hospital acquired pneumonia now resolved Acute on chronic respiratory failure now improved Plan Maintain current inhaled medications as prescribed No additional antibiotics necessary Return 2  months    Updated Medication List Outpatient Encounter Prescriptions as of 02/23/2012  Medication Sig Dispense Refill  . albuterol (PROVENTIL) (  2.5 MG/3ML) 0.083% nebulizer solution Take 3 mLs (2.5 mg total) by nebulization 4 (four) times daily.  75 mL    . albuterol (PROVENTIL) (5 MG/ML) 0.5% nebulizer solution Take 0.5 mLs (2.5 mg total) by nebulization every 4 (four) hours as needed for wheezing or shortness of breath.  20 mL    . allopurinol (ZYLOPRIM) 100 MG tablet Take 100 mg by mouth 2 (two) times daily.        Marland Kitchen aspirin 81 MG tablet Take 81 mg by mouth daily.        Marland Kitchen atenolol (TENORMIN) 50 MG tablet Take 50 mg by mouth daily.       . budesonide (PULMICORT) 0.25 MG/2ML nebulizer solution  Take 0.25 mg by nebulization 4 (four) times daily.       Marland Kitchen dextromethorphan (DELSYM) 30 MG/5ML liquid Take 60 mg by mouth every 12 (twelve) hours as needed.        Marland Kitchen epoetin alfa (EPOGEN,PROCRIT) [address] UNIT/ML injection Inject 10,000 Units into the skin once a week.        . ferrous sulfate 325 (65 FE) MG tablet Take 325 mg by mouth 2 (two) times daily.        . hydrocortisone 2.5 % cream Apply topically as needed.      . insulin aspart (NOVOLOG) 100 UNIT/ML injection Inject 2-10 Units into the skin 3 (three) times daily before meals. And at bedtime Per sliding scale      . insulin glargine (LANTUS) 100 UNIT/ML injection Inject 12 Units into the skin at bedtime.       . isosorbide mononitrate (IMDUR) 30 MG 24 hr tablet Take 15 mg by mouth daily.      . Lactobacillus (ACIDOPHILUS PO) Take 1 capsule by mouth 2 (two) times daily.      Marland Kitchen omeprazole (PRILOSEC) 20 MG capsule Take 20 mg by mouth daily.        . Polyvinyl Alcohol (AKWA TEARS OP) Apply 2 drops to eye 4 (four) times daily.       . rosuvastatin (CRESTOR) 20 MG tablet Take 20 mg by mouth daily.        . sennosides-docusate sodium (SENOKOT-S) 8.6-50 MG tablet Take 1 tablet by mouth at bedtime.        Marland Kitchen spironolactone (ALDACTONE) 25 MG tablet Take 12.5 mg by mouth daily.      Marland Kitchen tiotropium (SPIRIVA) 18 MCG inhalation capsule Place 18 mcg into inhaler and inhale daily.        Marland Kitchen torsemide (DEMADEX) 20 MG tablet Take 20 mg by mouth daily.

## 2012-02-24 NOTE — Assessment & Plan Note (Addendum)
Chronic obstructive lung disease stage D. oxygen dependent Recent right lower lobe hospital acquired pneumonia now resolved Acute on chronic respiratory failure now improved Plan Maintain current inhaled medications as prescribed No additional antibiotics necessary Return 2  months

## 2012-03-03 ENCOUNTER — Ambulatory Visit (INDEPENDENT_AMBULATORY_CARE_PROVIDER_SITE_OTHER): Payer: Medicare Other | Admitting: Cardiology

## 2012-03-03 ENCOUNTER — Encounter: Payer: Self-pay | Admitting: Cardiology

## 2012-03-03 VITALS — BP 130/60 | HR 72 | Ht 59.0 in | Wt 167.4 lb

## 2012-03-03 DIAGNOSIS — I5032 Chronic diastolic (congestive) heart failure: Secondary | ICD-10-CM

## 2012-03-03 DIAGNOSIS — I10 Essential (primary) hypertension: Secondary | ICD-10-CM

## 2012-03-03 DIAGNOSIS — I2510 Atherosclerotic heart disease of native coronary artery without angina pectoris: Secondary | ICD-10-CM

## 2012-03-03 NOTE — Assessment & Plan Note (Signed)
The blood pressure is at target. No change in medications is indicated. We will continue with therapeutic lifestyle changes (TLC).  

## 2012-03-03 NOTE — Patient Instructions (Addendum)
The current medical regimen is effective;  continue present plan and medications.  Your physician has requested that you have a lexiscan myoview. For further information please visit [email or web address]. Please follow instruction sheet, as given.  Follow up with Dr Hochrein in 4 months. 

## 2012-03-03 NOTE — Progress Notes (Signed)
HPI The patient presents for followup of diastolic dysfunction and edema and CAD. Since I last saw her she was hospitalized for treatment of a right lower lobe pneumonia. She says she's breathing better now than she was at the time of that admission though she still week. She does describe some episodes of chest discomfort or heaviness. This seems to happen sporadically but sometimes when she ambulating with her walker to the dining hall. She can't really quantify or qualify this further. She's not having resting PND or orthopnea. She's not having any palpitations, presyncope or syncope. She does have some continued lower extremity edema.   Allergies  Allergen Reactions  . Codeine     REACTION: unspecified  . Doxycycline   . Guaifenesin & Derivatives   . Penicillins     REACTION: unspecified    Current Outpatient Prescriptions  Medication Sig Dispense Refill  . albuterol (PROVENTIL) (2.5 MG/3ML) 0.083% nebulizer solution Take 3 mLs (2.5 mg total) by nebulization 4 (four) times daily.  75 mL    . albuterol (PROVENTIL) (5 MG/ML) 0.5% nebulizer solution Take 0.5 mLs (2.5 mg total) by nebulization every 4 (four) hours as needed for wheezing or shortness of breath.  20 mL    . allopurinol (ZYLOPRIM) 100 MG tablet Take 100 mg by mouth 2 (two) times daily.        Marland Kitchen aspirin 81 MG tablet Take 81 mg by mouth daily.        Marland Kitchen atenolol (TENORMIN) 50 MG tablet Take 50 mg by mouth daily.       . budesonide (PULMICORT) 0.25 MG/2ML nebulizer solution Take 0.25 mg by nebulization 4 (four) times daily.       Marland Kitchen dextromethorphan (DELSYM) 30 MG/5ML liquid Take 60 mg by mouth every 12 (twelve) hours as needed.        Marland Kitchen epoetin alfa (EPOGEN,PROCRIT) 46962 UNIT/ML injection Inject 10,000 Units into the skin once a week.        . ferrous sulfate 325 (65 FE) MG tablet Take 325 mg by mouth 2 (two) times daily.        . hydrocortisone 2.5 % cream Apply topically as needed.      . insulin aspart (NOVOLOG) 100  UNIT/ML injection Inject 2-10 Units into the skin 3 (three) times daily before meals. And at bedtime Per sliding scale      . insulin glargine (LANTUS) 100 UNIT/ML injection Inject 12 Units into the skin at bedtime.       . isosorbide mononitrate (IMDUR) 30 MG 24 hr tablet Take 15 mg by mouth daily.      . Lactobacillus (ACIDOPHILUS PO) Take 1 capsule by mouth 2 (two) times daily.      Marland Kitchen lidocaine (LIDODERM) 5 % Place 1 patch onto the skin daily. Remove & Discard patch within 12 hours or as directed by MD      . omeprazole (PRILOSEC) 20 MG capsule Take 20 mg by mouth daily.        . Polyvinyl Alcohol (AKWA TEARS OP) Apply 2 drops to eye 4 (four) times daily.       . rosuvastatin (CRESTOR) 20 MG tablet Take 20 mg by mouth daily.        . sennosides-docusate sodium (SENOKOT-S) 8.6-50 MG tablet Take 1 tablet by mouth at bedtime.        Marland Kitchen spironolactone (ALDACTONE) 25 MG tablet Take 12.5 mg by mouth daily.      Marland Kitchen tiotropium (SPIRIVA) 18 MCG  inhalation capsule Place 18 mcg into inhaler and inhale daily.        Marland Kitchen torsemide (DEMADEX) 20 MG tablet Take 20 mg by mouth daily.        Past Medical History  Diagnosis Date  . COPD (chronic obstructive pulmonary disease)   . Diastolic congestive heart failure   . C. difficile colitis   . Hypertension   . Hyperlipidemia   . CAD (coronary artery disease)   . Anemia   . Type 2 diabetes mellitus   . Pacemaker 2002  . Cardiac arrest - asystole 2002  . Gallstones   . Pulmonary hypertension   . Renal insufficiency   . Osteoarthritis   . Allergic rhinitis   . Gout   . Stasis dermatitis   . Sinoatrial node dysfunction   . Syncope   . Postsurgical percutaneous transluminal coronary angioplasty status   . Dyslipidemia   . Acute bronchitis   . History of cholelithiasis   . Obesity     Past Surgical History  Procedure Date  . Ptca 2002  . Appendectomy   . Vesicovaginal fistula closure w/ tah   . Pacemaker placement 2002    ROS: As stated in the  HPI and negative for all other systems.  PHYSICAL EXAM BP 130/60  Pulse 72  Ht 4\' 11"  (1.499 m)  Wt 130 lb 12.8 oz (59.33 kg)  BMI 26.42 kg/m2 PHYSICAL EXAM GEN:  No distress NECK:  No jugular venous distention at 90 degrees, waveform within normal limits, carotid upstroke brisk and symmetric, no bruits, no thyromegaly LYMPHATICS:  No cervical adenopathy LUNGS:  Expiratory wheezing BACK:  No CVA tenderness CHEST:  Unremarkable HEART:  S1 and S2 within normal limits, no S3, no S4, no clicks, no rubs, no murmurs ABD:  Positive bowel sounds normal in frequency in pitch, no bruits, no rebound, no guarding, unable to assess midline mass or bruit with the patient seated. EXT:  2 plus pulses throughout, moderate edema, no cyanosis no clubbing, with chronic venous stasis. SKIN:  No rashes no nodules NEURO:  Cranial nerves II through XII grossly intact, motor grossly intact throughout PSYCH:  Cognitively intact, oriented to person place and time  EKG:  Sinus rhythm, rate 72, atrial demand pacemaker, right axis deviation, interventricular conduction defect.  03/03/2012  ASSESSMENT AND PLAN

## 2012-03-03 NOTE — Assessment & Plan Note (Signed)
She is having some chest discomfort with activity as described. She has had previous coronary disease and stenting. He has been several years since her last stress test. She would not be a little walk on a treadmill. She needs stress testing and will have a Lexiscan Myoview.

## 2012-03-03 NOTE — Assessment & Plan Note (Signed)
She seems to be euvolemic.  At this point, no change in therapy is indicated.  We have reviewed salt and fluid restrictions.  No further cardiovascular testing is indicated.   

## 2012-03-06 ENCOUNTER — Encounter: Payer: Self-pay | Admitting: Cardiology

## 2012-03-07 ENCOUNTER — Ambulatory Visit: Payer: Medicare Other | Admitting: Cardiology

## 2012-03-07 ENCOUNTER — Telehealth: Payer: Self-pay | Admitting: Cardiology

## 2012-03-07 NOTE — Telephone Encounter (Signed)
Pt calling re appt she was to have re something she was suppose to do

## 2012-03-07 NOTE — Telephone Encounter (Signed)
Scheduled for 7/3

## 2012-03-08 NOTE — Telephone Encounter (Signed)
Left message for pt to call back so that why can address any questions she has

## 2012-03-09 NOTE — Telephone Encounter (Signed)
Per pt - her grandson is going to be leaving the country and she wants to reschedule her Lexiscan.  Will forward information to scheduling department to do so.

## 2012-03-15 ENCOUNTER — Other Ambulatory Visit (HOSPITAL_COMMUNITY): Payer: Medicare Other

## 2012-03-20 ENCOUNTER — Telehealth: Payer: Self-pay | Admitting: *Deleted

## 2012-03-20 NOTE — Telephone Encounter (Signed)
[name], [name] was seen by Dr. Antoine Poche on 03/07/12. Dr. Antoine Poche ordered Myoview that was scheduled for 03/15/12. [name] canceled Myoview because her family was in town. I called [name] today to see if she was ready to r/s Myoview. Patient states her family is still in town. She will call us when she is ready to schedule.

## 2012-03-28 ENCOUNTER — Telehealth: Payer: Self-pay | Admitting: Internal Medicine

## 2012-03-28 NOTE — Telephone Encounter (Signed)
03-28-12 lmm @ 450pm for transportation @ [facility], to set up past due pacer ck with device clinic/mt

## 2012-05-02 ENCOUNTER — Ambulatory Visit (INDEPENDENT_AMBULATORY_CARE_PROVIDER_SITE_OTHER): Payer: Medicare Other | Admitting: Critical Care Medicine

## 2012-05-02 ENCOUNTER — Encounter: Payer: Self-pay | Admitting: Critical Care Medicine

## 2012-05-02 VITALS — BP 138/64 | HR 75 | Temp 98.7°F | Ht 59.0 in | Wt 168.0 lb

## 2012-05-02 DIAGNOSIS — J449 Chronic obstructive pulmonary disease, unspecified: Secondary | ICD-10-CM

## 2012-05-02 DIAGNOSIS — J4489 Other specified chronic obstructive pulmonary disease: Secondary | ICD-10-CM

## 2012-05-02 MED ORDER — AZITHROMYCIN 250 MG PO TABS: 250.0000 mg | ORAL_TABLET | Freq: Every day | ORAL | Status: AC

## 2012-05-02 MED ORDER — PREDNISONE 10 MG PO TABS: 10.0000 mg | ORAL_TABLET | Freq: Every day | ORAL | Status: DC

## 2012-05-02 MED ORDER — PREDNISONE 10 MG PO TABS: ORAL_TABLET | ORAL | Status: DC

## 2012-05-02 NOTE — Patient Instructions (Addendum)
Prednisone 10mg   Take 4 for three days 3 for three days 2 for three days 1 for three days and stop Azithromycin 250mg  Take two once then one daily until gone No other changes noted Return 2 months

## 2012-05-02 NOTE — Progress Notes (Signed)
Subjective:    Patient ID: [name], female    DOB: 1928/05/17, 78 y.o.   MRN: [id number]  HPI  78 y.o.F quit smoking in 1987 and chronic renal insufficiency with coronary artery disease. The patient had history of congestive heart failure and diabetes. The patient has been hospitalized on numerous occasions for respiratory failure and pneumonia. Patient currently is residing in a nursing care facility.   05/02/2012 Since last OV has min mucus.  ? If getting URI now.  Pt notes sl heaviness in breathing.  Dyspnea is noted with exertion. No real chest pain.  No edema in feet.  Notes no throat irritation. Pt denies any significant sore throat, nasal congestion or excess secretions, fever, chills, sweats, unintended weight loss, pleurtic or exertional chest pain, orthopnea PND, or leg swelling Pt denies any increase in rescue therapy over baseline, denies waking up needing it or having any early am or nocturnal exacerbations of coughing/wheezing/or dyspnea. Pt also denies any obvious fluctuation in symptoms with  weather or environmental change or other alleviating or aggravating factors   Objective:    Review of Systems  Constitutional:   No  weight loss, night sweats,  Fevers, chills, fatigue, lassitude. HEENT:   No headaches,  Difficulty swallowing,  Tooth/dental problems,  Sore throat,                No sneezing, itching, ear ache, nasal congestion, post nasal drip,   CV:  No chest pain,  Orthopnea, PND, swelling in lower extremities, anasarca, dizziness, palpitations  GI  No heartburn, indigestion, abdominal pain, nausea, vomiting, diarrhea, change in bowel habits, loss of appetite  Resp: Notes  shortness of breath with exertion not  at rest.  No excess mucus, no productive cough,  No non-productive cough,  No coughing up of blood.  No change in color of mucus.  No wheezing.  No chest wall deformity  Skin: no rash or lesions.  GU: no dysuria, change in color of urine, no urgency  or frequency.  No flank pain.  MS:  No joint pain or swelling.  No decreased range of motion.  No back pain.  Psych:  No change in mood or affect. No depression or anxiety.  No memory loss.     Objective:   Physical Exam  Filed Vitals:   05/02/12 1421  BP: 138/64  Pulse: 75  Temp: 98.7 F (37.1 C)  TempSrc: Oral  Height: 4\' 11"  (1.499 m)  Weight: 168 lb (76.204 kg)  SpO2: 98%    Gen: Pleasant, well-nourished, in no distress,  normal affect  ENT: No lesions,  mouth clear,  oropharynx clear, no postnasal drip  Neck: No JVD, no TMG, no carotid bruits  Lungs: No use of accessory muscles, no dullness to percussion, expired wheezes  Cardiovascular: RRR, heart sounds normal, no murmur or gallops, no peripheral edema  Abdomen: soft and NT, no HSM,  BS normal  Musculoskeletal: No deformities, no cyanosis or clubbing  Neuro: alert, non focal  Skin: Warm, no lesions or rashes        Assessment & Plan:   COPD Chronic obstructive lung disease with asthmatic bronchitic component and flair  Plan Prednisone 10mg   Take 4 for three days 3 for three days 2 for three days 1 for three days and stop Azithromycin 250mg  Take two once then one daily until gone continue inhaled medications as prescribed    Updated Medication List Outpatient Encounter Prescriptions as of 05/02/2012  Medication Sig Dispense  Refill  . albuterol (PROVENTIL) (2.5 MG/3ML) 0.083% nebulizer solution Take 3 mLs (2.5 mg total) by nebulization 4 (four) times daily.  75 mL    . allopurinol (ZYLOPRIM) 100 MG tablet Take 100 mg by mouth 2 (two) times daily.        Marland Kitchen aspirin 81 MG tablet Take 81 mg by mouth daily.        Marland Kitchen atenolol (TENORMIN) 50 MG tablet Take 50 mg by mouth daily.       . budesonide (PULMICORT) 0.25 MG/2ML nebulizer solution Take 0.25 mg by nebulization 4 (four) times daily.       Marland Kitchen dextromethorphan (DELSYM) 30 MG/5ML liquid Take 60 mg by mouth every 12 (twelve) hours as needed.        Marland Kitchen  epoetin alfa (EPOGEN,PROCRIT) 81191 UNIT/ML injection Inject 10,000 Units into the skin once a week.        . ferrous sulfate 325 (65 FE) MG tablet Take 325 mg by mouth 2 (two) times daily.        . insulin aspart (NOVOLOG) 100 UNIT/ML injection Inject 2-10 Units into the skin 3 (three) times daily before meals. And at bedtime Per sliding scale      . insulin glargine (LANTUS) 100 UNIT/ML injection Inject 12 Units into the skin at bedtime.       . isosorbide mononitrate (IMDUR) 30 MG 24 hr tablet Take 15 mg by mouth daily.      Marland Kitchen lidocaine (LIDODERM) 5 % Place 1 patch onto the skin daily. Remove & Discard patch within 12 hours or as directed by MD      . omeprazole (PRILOSEC) 20 MG capsule Take 20 mg by mouth daily.        . Polyvinyl Alcohol (AKWA TEARS OP) Apply 2 drops to eye 4 (four) times daily.       . rosuvastatin (CRESTOR) 20 MG tablet Take 20 mg by mouth daily.        . sennosides-docusate sodium (SENOKOT-S) 8.6-50 MG tablet Take 1 tablet by mouth at bedtime.        Marland Kitchen spironolactone (ALDACTONE) 25 MG tablet Take 12.5 mg by mouth daily.      Marland Kitchen tiotropium (SPIRIVA) 18 MCG inhalation capsule Place 18 mcg into inhaler and inhale daily.        Marland Kitchen torsemide (DEMADEX) 20 MG tablet Take 20 mg by mouth daily.      Marland Kitchen azithromycin (ZITHROMAX) 250 MG tablet Take 1 tablet (250 mg total) by mouth daily. Take two once then one daily until gone  6 each  0  . predniSONE (DELTASONE) 10 MG tablet Take 4 for three days 3 for three days 2 for three days 1 for three days and stop  30 tablet  0  . DISCONTD: albuterol (PROVENTIL) (5 MG/ML) 0.5% nebulizer solution Take 0.5 mLs (2.5 mg total) by nebulization every 4 (four) hours as needed for wheezing or shortness of breath.  20 mL    . DISCONTD: hydrocortisone 2.5 % cream Apply topically as needed.      Marland Kitchen DISCONTD: Lactobacillus (ACIDOPHILUS PO) Take 1 capsule by mouth 2 (two) times daily.      Marland Kitchen DISCONTD: predniSONE (DELTASONE) 10 MG tablet Take 1 tablet (10 mg  total) by mouth daily.  10 tablet  0

## 2012-05-02 NOTE — Assessment & Plan Note (Signed)
Chronic obstructive lung disease with asthmatic bronchitic component and flair  Plan Prednisone 10mg   Take 4 for three days 3 for three days 2 for three days 1 for three days and stop Azithromycin 250mg  Take two once then one daily until gone continue inhaled medications as prescribed

## 2012-05-17 ENCOUNTER — Encounter: Payer: Self-pay | Admitting: Cardiology

## 2012-05-31 ENCOUNTER — Ambulatory Visit (INDEPENDENT_AMBULATORY_CARE_PROVIDER_SITE_OTHER): Payer: Medicare Other | Admitting: *Deleted

## 2012-05-31 ENCOUNTER — Encounter: Payer: Self-pay | Admitting: Internal Medicine

## 2012-05-31 ENCOUNTER — Encounter: Payer: Self-pay | Admitting: Cardiology

## 2012-05-31 ENCOUNTER — Ambulatory Visit (INDEPENDENT_AMBULATORY_CARE_PROVIDER_SITE_OTHER): Payer: Medicare Other | Admitting: Cardiology

## 2012-05-31 VITALS — BP 132/69 | HR 72 | Ht 59.0 in | Wt 166.8 lb

## 2012-05-31 DIAGNOSIS — I5032 Chronic diastolic (congestive) heart failure: Secondary | ICD-10-CM

## 2012-05-31 DIAGNOSIS — I495 Sick sinus syndrome: Secondary | ICD-10-CM

## 2012-05-31 LAB — PACEMAKER DEVICE OBSERVATION
AL IMPEDENCE PM: 474 Ohm
AL THRESHOLD: 0.625 V
RV LEAD AMPLITUDE: 16 mv

## 2012-05-31 NOTE — Progress Notes (Signed)
HPI The patient presents for followup of diastolic dysfunction and edema and CAD. At the last visit she was describing some chest discomfort. She was to have a stress perfusion study but she canceled this.  Since that time she missed a followup appointment for pacemaker check. She has not had any more chest discomfort however. She ambulates somewhat. She's actually going to the [facility].  The patient denies any new symptoms such as chest discomfort, neck or arm discomfort. There has been no new shortness of breath, PND or orthopnea. There have been no reported palpitations, presyncope or syncope.   Allergies  Allergen Reactions  . Codeine     REACTION: unspecified  . Doxycycline   . Guaifenesin & Derivatives   . Penicillins     REACTION: unspecified    Current Outpatient Prescriptions  Medication Sig Dispense Refill  . albuterol (PROVENTIL) (2.5 MG/3ML) 0.083% nebulizer solution Take 3 mLs (2.5 mg total) by nebulization 4 (four) times daily.  75 mL    . allopurinol (ZYLOPRIM) 100 MG tablet Take 100 mg by mouth 2 (two) times daily.        Marland Kitchen aspirin 81 MG tablet Take 81 mg by mouth daily.        Marland Kitchen atenolol (TENORMIN) 50 MG tablet Take 50 mg by mouth daily.       . budesonide (PULMICORT) 0.25 MG/2ML nebulizer solution Take 0.25 mg by nebulization 4 (four) times daily.       Marland Kitchen dextromethorphan (DELSYM) 30 MG/5ML liquid Take 60 mg by mouth every 12 (twelve) hours as needed.        Marland Kitchen epoetin alfa (EPOGEN,PROCRIT) 09811 UNIT/ML injection Inject 10,000 Units into the skin once a week.        . ferrous sulfate 325 (65 FE) MG tablet Take 325 mg by mouth 2 (two) times daily.        . fish oil-omega-3 fatty acids 1000 MG capsule Take 2 g by mouth daily.      . insulin aspart (NOVOLOG) 100 UNIT/ML injection Inject 2-10 Units into the skin 3 (three) times daily before meals. And at bedtime Per sliding scale      . insulin glargine (LANTUS) 100 UNIT/ML injection Inject 12  Units into the skin at bedtime.       . isosorbide mononitrate (IMDUR) 30 MG 24 hr tablet Take 15 mg by mouth daily.      Marland Kitchen lidocaine (LIDODERM) 5 % Place 1 patch onto the skin daily. Remove & Discard patch within 12 hours or as directed by MD      . omeprazole (PRILOSEC) 20 MG capsule Take 20 mg by mouth daily.        . Polyvinyl Alcohol (AKWA TEARS OP) Apply 2 drops to eye 4 (four) times daily.       . predniSONE (DELTASONE) 10 MG tablet Take 4 for three days 3 for three days 2 for three days 1 for three days and stop  30 tablet  0  . rosuvastatin (CRESTOR) 20 MG tablet Take 20 mg by mouth daily.        . sennosides-docusate sodium (SENOKOT-S) 8.6-50 MG tablet Take 1 tablet by mouth at bedtime.        Marland Kitchen spironolactone (ALDACTONE) 25 MG tablet Take 12.5 mg by mouth daily.      Marland Kitchen tiotropium (SPIRIVA) 18 MCG inhalation capsule Place 18 mcg into inhaler and inhale daily.        Marland Kitchen  torsemide (DEMADEX) 20 MG tablet Take 20 mg by mouth daily.        Past Medical History  Diagnosis Date  . COPD (chronic obstructive pulmonary disease)   . Diastolic congestive heart failure   . C. difficile colitis   . Hypertension   . Hyperlipidemia   . CAD (coronary artery disease)   . Anemia   . Type 2 diabetes mellitus   . Pacemaker 2002  . Cardiac arrest - asystole 2002  . Gallstones   . Pulmonary hypertension   . Renal insufficiency   . Osteoarthritis   . Allergic rhinitis   . Gout   . Stasis dermatitis   . Sinoatrial node dysfunction   . Syncope   . Postsurgical percutaneous transluminal coronary angioplasty status   . Dyslipidemia   . Acute bronchitis   . History of cholelithiasis   . Obesity     Past Surgical History  Procedure Date  . Ptca 2002  . Appendectomy   . Vesicovaginal fistula closure w/ tah   . Pacemaker placement 2002    ROS: As stated in the HPI and negative for all other systems.  PHYSICAL EXAM BP 132/69  Pulse 72  Ht 4\' 11"  (1.499 m)  Wt 166 lb 12.8 oz (75.66 kg)   BMI 33.69 kg/m2 PHYSICAL EXAM GEN:  No distress NECK:  No jugular venous distention at 90 degrees, waveform within normal limits, carotid upstroke brisk and symmetric, no bruits, no thyromegaly LYMPHATICS:  No cervical adenopathy LUNGS:  Expiratory wheezing BACK:  No CVA tenderness CHEST:  Unremarkable HEART:  S1 and S2 within normal limits, no S3, no S4, no clicks, no rubs, no murmurs ABD:  Positive bowel sounds normal in frequency in pitch, no bruits, no rebound, no guarding, unable to assess midline mass or bruit with the patient seated. EXT:  2 plus pulses throughout, mild edema, no cyanosis no clubbing, with chronic venous stasis. SKIN:  No rashes no nodules NEURO:  Cranial nerves II through XII grossly intact, motor grossly intact throughout PSYCH:  Cognitively intact, oriented to person place and time   ASSESSMENT AND PLAN   CORONARY ARTERY DISEASE - She's not having chest discomfort. She did not want to have a stress test done previously. Therefore, I will manage her conservatively. No change in therapy is suggested.   DIASTOLIC HEART FAILURE, CHRONIC -  She seems to be euvolemic. At this point, no change in therapy is indicated. We called the nursing home to get a copy of her BMET drawn recently.  I reviewed this.  No change in therapy is indicated.  Her creat was mildly elevated but stable.    HYPERTENSION - The blood pressure is at target. No change in medications is indicated. We will continue with therapeutic lifestyle changes (TLC).   PACEMAKER - Her pacemaker was checked today and she was overdue. She has normal function.

## 2012-05-31 NOTE — Progress Notes (Signed)
Pacer check in clinic  

## 2012-05-31 NOTE — Patient Instructions (Addendum)
The current medical regimen is effective;  continue present plan and medications.  Follow up in 6 months with Dr Hochrein.  You will receive a letter in the mail 2 months before you are due.  Please call us when you receive this letter to schedule your follow up appointment.  

## 2012-06-30 ENCOUNTER — Encounter: Payer: Self-pay | Admitting: Critical Care Medicine

## 2012-06-30 ENCOUNTER — Ambulatory Visit (INDEPENDENT_AMBULATORY_CARE_PROVIDER_SITE_OTHER): Payer: Medicare Other | Admitting: Critical Care Medicine

## 2012-06-30 ENCOUNTER — Other Ambulatory Visit (INDEPENDENT_AMBULATORY_CARE_PROVIDER_SITE_OTHER): Payer: Medicare Other

## 2012-06-30 VITALS — BP 138/60 | HR 75 | Temp 98.4°F | Ht 59.0 in | Wt 165.2 lb

## 2012-06-30 DIAGNOSIS — E876 Hypokalemia: Secondary | ICD-10-CM

## 2012-06-30 DIAGNOSIS — J209 Acute bronchitis, unspecified: Secondary | ICD-10-CM

## 2012-06-30 DIAGNOSIS — E875 Hyperkalemia: Secondary | ICD-10-CM | POA: Insufficient documentation

## 2012-06-30 LAB — BASIC METABOLIC PANEL
CO2: 30 mEq/L (ref 19–32)
Calcium: 9.4 mg/dL (ref 8.4–10.5)
Creatinine, Ser: 2.7 mg/dL — ABNORMAL HIGH (ref 0.4–1.2)
Glucose, Bld: 118 mg/dL — ABNORMAL HIGH (ref 70–99)

## 2012-06-30 MED ORDER — MOXIFLOXACIN HCL 400 MG PO TABS: 400.0000 mg | ORAL_TABLET | Freq: Every day | ORAL | Status: DC

## 2012-06-30 MED ORDER — PREDNISONE 10 MG PO TABS: ORAL_TABLET | ORAL | Status: DC

## 2012-06-30 NOTE — Patient Instructions (Addendum)
Prednisone 10mg  Take 4 tablets daily for 5 days then stop Avelox one daily for 7days (scripts for above sent to your facility with you) Lab today to check for potassium level for leg cramps, we will call your facility with the results No other medication changes Return 2 months

## 2012-06-30 NOTE — Progress Notes (Signed)
Subjective:    Patient ID: [name], female    DOB: May 12, 1928, 78 y.o.   MRN: [id number]  HPI  78 y.o.F quit smoking in 1987 and chronic renal insufficiency with coronary artery disease. The patient had history of congestive heart failure and diabetes. The patient has been hospitalized on numerous occasions for respiratory failure and pneumonia. Patient currently is residing in a nursing care facility.   05/02/2012 Since last OV has min mucus.  ? If getting URI now.  Pt notes sl heaviness in breathing.  Dyspnea is noted with exertion. No real chest pain.  No edema in feet.  Notes no throat irritation. Pt denies any significant sore throat, nasal congestion or excess secretions, fever, chills, sweats, unintended weight loss, pleurtic or exertional chest pain, orthopnea PND, or leg swelling Pt denies any increase in rescue therapy over baseline, denies waking up needing it or having any early am or nocturnal exacerbations of coughing/wheezing/or dyspnea. Pt also denies any obvious fluctuation in symptoms with  weather or environmental change or other alleviating or aggravating factors  06/30/2012   Pt notes sl more  dyspnea.  Notes mucus and is white and thick to yellow. No edema in feet. No blood.  No chest pain.  Notes sl wheeze.  No pndrip.  Pt notes more cramps Pt denies any significant sore throat, nasal congestion or excess secretions, fever, chills, sweats, unintended weight loss, pleurtic or exertional chest pain, orthopnea PND, or leg swelling Pt denies any increase in rescue therapy over baseline, denies waking up needing it or having any early am or nocturnal exacerbations of coughing/wheezing/or dyspnea. Pt also denies any obvious fluctuation in symptoms with  weather or environmental change or other alleviating or aggravating factors    Objective:    Review of Systems  Constitutional:   No  weight loss, night sweats,  Fevers, chills, fatigue, lassitude. HEENT:   No  headaches,  Difficulty swallowing,  Tooth/dental problems,  Sore throat,                No sneezing, itching, ear ache, nasal congestion, post nasal drip,   CV:  No chest pain,  Orthopnea, PND, swelling in lower extremities, anasarca, dizziness, palpitations  GI  No heartburn, indigestion, abdominal pain, nausea, vomiting, diarrhea, change in bowel habits, loss of appetite  Resp: Notes  shortness of breath with exertion not  at rest.  No excess mucus, no productive cough,  No non-productive cough,  No coughing up of blood.  No change in color of mucus.  No wheezing.  No chest wall deformity  Skin: no rash or lesions.  GU: no dysuria, change in color of urine, no urgency or frequency.  No flank pain.  MS:  No joint pain or swelling.  No decreased range of motion.  No back pain.  Psych:  No change in mood or affect. No depression or anxiety.  No memory loss.     Objective:   Physical Exam  There were no vitals filed for this visit.  Gen: Pleasant, well-nourished, in no distress,  normal affect  ENT: No lesions,  mouth clear,  oropharynx clear, no postnasal drip  Neck: No JVD, no TMG, no carotid bruits  Lungs: No use of accessory muscles, no dullness to percussion, expired wheezes  Cardiovascular: RRR, heart sounds normal, no murmur or gallops, no peripheral edema  Abdomen: soft and NT, no HSM,  BS normal  Musculoskeletal: No deformities, no cyanosis or clubbing  Neuro: alert, non focal  Skin: Warm, no lesions or rashes        Assessment & Plan:   No problem-specific assessment & plan notes found for this encounter.   Updated Medication List Outpatient Encounter Prescriptions as of 06/30/2012  Medication Sig Dispense Refill  . albuterol (PROVENTIL) (2.5 MG/3ML) 0.083% nebulizer solution Take 2.5 mg by nebulization 4 (four) times daily. And every 4 hours as needed for wheezing      . allopurinol (ZYLOPRIM) 100 MG tablet Take 100 mg by mouth 2 (two) times daily.         Marland Kitchen aspirin 81 MG tablet Take 81 mg by mouth daily.        Marland Kitchen atenolol (TENORMIN) 50 MG tablet Take 50 mg by mouth daily.       . budesonide (PULMICORT) 0.25 MG/2ML nebulizer solution Take 0.25 mg by nebulization 4 (four) times daily.       . Cholecalciferol (VITAMIN D-3) 5000 UNITS TABS Take 1 tablet by mouth daily.      Marland Kitchen dextromethorphan (DELSYM) 30 MG/5ML liquid Take 60 mg by mouth every 12 (twelve) hours as needed.       Marland Kitchen epoetin alfa (EPOGEN,PROCRIT) 32440 UNIT/ML injection Inject 10,000 Units into the skin once a week.        . ferrous sulfate 325 (65 FE) MG tablet Take 325 mg by mouth 2 (two) times daily.        . insulin glargine (LANTUS) 100 UNIT/ML injection Inject 12 Units into the skin at bedtime.       . isosorbide mononitrate (IMDUR) 30 MG 24 hr tablet Take 15 mg by mouth daily.      Marland Kitchen lidocaine (LIDODERM) 5 % Place 1 patch onto the skin daily. Remove & Discard patch within 12 hours or as directed by MD      . [name] 1 G capsule Take 1 tablet by mouth daily.      Marland Kitchen omeprazole (PRILOSEC) 20 MG capsule Take 20 mg by mouth daily.        . Polyvinyl Alcohol (AKWA TEARS OP) Apply 2 drops to eye 4 (four) times daily.       . rosuvastatin (CRESTOR) 20 MG tablet Take 20 mg by mouth daily.        . sennosides-docusate sodium (SENOKOT-S) 8.6-50 MG tablet Take 1 tablet by mouth at bedtime.        Marland Kitchen spironolactone (ALDACTONE) 25 MG tablet Take 12.5 mg by mouth daily.      Marland Kitchen tiotropium (SPIRIVA) 18 MCG inhalation capsule Place 18 mcg into inhaler and inhale daily.        Marland Kitchen torsemide (DEMADEX) 20 MG tablet Take 20 mg by mouth daily.      Marland Kitchen DISCONTD: albuterol (PROVENTIL) (2.5 MG/3ML) 0.083% nebulizer solution Take 3 mLs (2.5 mg total) by nebulization 4 (four) times daily.  75 mL    . DISCONTD: fish oil-omega-3 fatty acids 1000 MG capsule Take 2 g by mouth daily.      Marland Kitchen DISCONTD: insulin aspart (NOVOLOG) 100 UNIT/ML injection Inject 2-10 Units into the skin 3 (three) times daily before meals. And  at bedtime Per sliding scale      . DISCONTD: predniSONE (DELTASONE) 10 MG tablet Take 4 for three days 3 for three days 2 for three days 1 for three days and stop  30 tablet  0

## 2012-06-30 NOTE — Assessment & Plan Note (Signed)
Acute tracheobronchitis with associated COPD flare Plan Prednisone 10mg  Take 4 tablets daily for 5 days then stop Avelox one daily for 7days Lab today to check for potassium level for leg cramps, No other medication changes Return 2 months

## 2012-08-11 ENCOUNTER — Encounter: Payer: Self-pay | Admitting: *Deleted

## 2012-08-18 ENCOUNTER — Encounter (INDEPENDENT_AMBULATORY_CARE_PROVIDER_SITE_OTHER): Payer: PRIVATE HEALTH INSURANCE | Admitting: Internal Medicine

## 2012-08-18 DIAGNOSIS — I495 Sick sinus syndrome: Secondary | ICD-10-CM

## 2012-08-30 ENCOUNTER — Ambulatory Visit: Payer: Medicare Other | Admitting: Critical Care Medicine

## 2012-09-29 ENCOUNTER — Other Ambulatory Visit: Payer: Self-pay | Admitting: Internal Medicine

## 2012-09-29 DIAGNOSIS — R0989 Other specified symptoms and signs involving the circulatory and respiratory systems: Secondary | ICD-10-CM

## 2012-09-29 DIAGNOSIS — R079 Chest pain, unspecified: Secondary | ICD-10-CM

## 2012-09-29 DIAGNOSIS — R05 Cough: Secondary | ICD-10-CM

## 2012-10-02 ENCOUNTER — Ambulatory Visit: Payer: PRIVATE HEALTH INSURANCE | Admitting: Cardiology

## 2012-10-03 ENCOUNTER — Ambulatory Visit (HOSPITAL_COMMUNITY)
Admission: RE | Admit: 2012-10-03 | Discharge: 2012-10-03 | Disposition: A | Discharge status: Home / Self Care | Payer: PRIVATE HEALTH INSURANCE | Source: Ambulatory Visit | Attending: Internal Medicine | Admitting: Internal Medicine

## 2012-10-03 DIAGNOSIS — I517 Cardiomegaly: Secondary | ICD-10-CM | POA: Insufficient documentation

## 2012-10-03 DIAGNOSIS — R05 Cough: Secondary | ICD-10-CM

## 2012-10-03 DIAGNOSIS — R918 Other nonspecific abnormal finding of lung field: Secondary | ICD-10-CM | POA: Insufficient documentation

## 2012-10-03 DIAGNOSIS — R059 Cough, unspecified: Secondary | ICD-10-CM | POA: Insufficient documentation

## 2012-10-03 DIAGNOSIS — R079 Chest pain, unspecified: Secondary | ICD-10-CM | POA: Insufficient documentation

## 2012-10-03 DIAGNOSIS — R0989 Other specified symptoms and signs involving the circulatory and respiratory systems: Secondary | ICD-10-CM

## 2012-10-04 ENCOUNTER — Ambulatory Visit: Payer: Self-pay | Admitting: Critical Care Medicine

## 2012-10-09 ENCOUNTER — Ambulatory Visit (INDEPENDENT_AMBULATORY_CARE_PROVIDER_SITE_OTHER): Payer: PRIVATE HEALTH INSURANCE | Admitting: Internal Medicine

## 2012-10-09 ENCOUNTER — Encounter: Payer: Self-pay | Admitting: Internal Medicine

## 2012-10-09 VITALS — BP 150/78 | HR 78 | Ht 59.0 in | Wt 171.0 lb

## 2012-10-09 DIAGNOSIS — R0602 Shortness of breath: Secondary | ICD-10-CM

## 2012-10-09 DIAGNOSIS — R55 Syncope and collapse: Secondary | ICD-10-CM

## 2012-10-09 DIAGNOSIS — E875 Hyperkalemia: Secondary | ICD-10-CM

## 2012-10-09 DIAGNOSIS — I5032 Chronic diastolic (congestive) heart failure: Secondary | ICD-10-CM

## 2012-10-09 DIAGNOSIS — I495 Sick sinus syndrome: Secondary | ICD-10-CM

## 2012-10-09 DIAGNOSIS — I10 Essential (primary) hypertension: Secondary | ICD-10-CM

## 2012-10-09 DIAGNOSIS — Z950 Presence of cardiac pacemaker: Secondary | ICD-10-CM

## 2012-10-09 DIAGNOSIS — I2510 Atherosclerotic heart disease of native coronary artery without angina pectoris: Secondary | ICD-10-CM

## 2012-10-09 DIAGNOSIS — R609 Edema, unspecified: Secondary | ICD-10-CM

## 2012-10-09 LAB — BRAIN NATRIURETIC PEPTIDE: Pro B Natriuretic peptide (BNP): 97 pg/mL (ref 0.0–100.0)

## 2012-10-09 LAB — PACEMAKER DEVICE OBSERVATION
AL IMPEDENCE PM: 461 Ohm
AL THRESHOLD: 0.5 V
RV LEAD AMPLITUDE: 8 mv
RV LEAD IMPEDENCE PM: 522 Ohm

## 2012-10-09 LAB — BASIC METABOLIC PANEL
CO2: 28 mEq/L (ref 19–32)
Chloride: 105 mEq/L (ref 96–112)
Creatinine, Ser: 2.5 mg/dL — ABNORMAL HIGH (ref 0.4–1.2)
Potassium: 4.5 mEq/L (ref 3.5–5.1)

## 2012-10-09 NOTE — Assessment & Plan Note (Signed)
Infrequent-3%-atrial pacing

## 2012-10-09 NOTE — Assessment & Plan Note (Signed)
No recurrent syncope 

## 2012-10-09 NOTE — Assessment & Plan Note (Signed)
As above.

## 2012-10-09 NOTE — Assessment & Plan Note (Addendum)
She has significant volume overload which she correlates in time with the treatment with her prednisone. I will take the liberty of increasing her diuretic twice a day for 5 days. We will also measure a BNP  Her last potassium in the computer was 5.8. She is on Aldactone. We'll measure a BNP

## 2012-10-09 NOTE — Assessment & Plan Note (Signed)
Stable on current meds 

## 2012-10-09 NOTE — Assessment & Plan Note (Signed)
The patient's device was interrogated.  The information was reviewed. No changes were made in the programming.   a 

## 2012-10-09 NOTE — Patient Instructions (Addendum)
Remote monitoring is used to monitor your Pacemaker of ICD from home. This monitoring reduces the number of office visits required to check your device to one time per year. It allows [place] to keep an eye on the functioning of your device to ensure it is working properly. You are scheduled for a device check from home on January 08, 2013. You may send your transmission at any time that day. If you have a wireless device, the transmission will be sent automatically. After your physician reviews your transmission, you will receive a postcard with your next transmission date.  Your physician wants you to follow-up in: 1 year You will receive a reminder letter in the mail two months in advance. If you don't receive a letter, please call our office to schedule the follow-up appointment.  Your physician has recommended you make the following change in your medication: INCREASE Demadex to 40 mg for 5 days  Your physician recommends that you have lab work drawn today - BNP and BMP

## 2012-10-09 NOTE — Progress Notes (Signed)
Patient Care Team: Angela Cox, MD as PCP - General (Internal Medicine) Storm Frisk, MD (Pulmonary Disease)   HPI  [name] is a 79 y.o. female seen in followup for syncope for which she underwent pacemaker implantation some time ago. She had no recurrent syncope. She denies significant problem with sob and chest pain  She has developed bronchitis. She has been treated she says with antibiotics and prednisone. There is a disconnect between the [facility] that she brings from the nursing home [place]. She is noted significant edema in her feet over the last week or 2.   She underwent pacemaker generator replacement December 2012  She has had no syncope or palpitations  Echo May 2009 Normal LV function  Catheterization April 2006 demonstrated LAD 40% stenosis. This proximal stent which was patent with 40-50% stenosis. First diagonal had ostial 60% stenosis, second diagonal was normal. The circumflex had luminal irregularities. There was a ramus intermediate with ostial 60% stenosis, mid obtuse marginalhad luminal irregularities, the right coronary artery is dominant with luminal irregularities. The patient had PTCA of the LAD in 2002)   Past Medical History  Diagnosis Date  . COPD (chronic obstructive pulmonary disease)   . Diastolic congestive heart failure   . C. difficile colitis   . Hypertension   . Hyperlipidemia   . CAD (coronary artery disease)   . Anemia   . Type 2 diabetes mellitus   . Pacemaker 2002  . Cardiac arrest - asystole 2002  . Gallstones   . Pulmonary hypertension   . Renal insufficiency   . Osteoarthritis   . Allergic rhinitis   . Gout   . Stasis dermatitis   . Sinoatrial node dysfunction   . Syncope   . Postsurgical percutaneous transluminal coronary angioplasty status   . Dyslipidemia   . Acute bronchitis   . History of cholelithiasis   . Obesity     Past Surgical History  Procedure Date  . Ptca 2002  . Appendectomy   .  Vesicovaginal fistula closure w/ tah   . Pacemaker placement 2002    Current Outpatient Prescriptions  Medication Sig Dispense Refill  . albuterol (PROVENTIL) (2.5 MG/3ML) 0.083% nebulizer solution Take 2.5 mg by nebulization 4 (four) times daily. And every 4 hours as needed for wheezing      . allopurinol (ZYLOPRIM) 100 MG tablet Take 100 mg by mouth 2 (two) times daily.        Marland Kitchen aspirin 81 MG tablet Take 81 mg by mouth daily.        Marland Kitchen atenolol (TENORMIN) 50 MG tablet Take 50 mg by mouth daily.       . budesonide (PULMICORT) 0.25 MG/2ML nebulizer solution Take 0.25 mg by nebulization 4 (four) times daily.       . Cholecalciferol (VITAMIN D-3) 5000 UNITS TABS Take 1 tablet by mouth daily.      Marland Kitchen dextromethorphan (DELSYM) 30 MG/5ML liquid Take 60 mg by mouth every 12 (twelve) hours as needed.       Marland Kitchen epoetin alfa (EPOGEN,PROCRIT) 13244 UNIT/ML injection Inject 10,000 Units into the skin once a week.        . ferrous sulfate 325 (65 FE) MG tablet Take 325 mg by mouth 2 (two) times daily.        . insulin glargine (LANTUS) 100 UNIT/ML injection Inject 12 Units into the skin at bedtime.       . isosorbide mononitrate (IMDUR) 30 MG 24 hr  tablet Take 15 mg by mouth daily.      Marland Kitchen lidocaine (LIDODERM) 5 % Place 1 patch onto the skin daily. Remove & Discard patch within 12 hours or as directed by MD      . [name] 1 G capsule Take 1 tablet by mouth daily.      Marland Kitchen moxifloxacin (AVELOX) 400 MG tablet Take 1 tablet (400 mg total) by mouth daily.  7 tablet  0  . omeprazole (PRILOSEC) 20 MG capsule Take 20 mg by mouth daily.        . Polyvinyl Alcohol (AKWA TEARS OP) Apply 2 drops to eye 4 (four) times daily.       . predniSONE (DELTASONE) 10 MG tablet Take 4 tablets daily for 5 days then stop  20 tablet  0  . rosuvastatin (CRESTOR) 20 MG tablet Take 20 mg by mouth daily.        . sennosides-docusate sodium (SENOKOT-S) 8.6-50 MG tablet Take 1 tablet by mouth at bedtime.        Marland Kitchen spironolactone (ALDACTONE)  25 MG tablet Take 12.5 mg by mouth daily.      Marland Kitchen tiotropium (SPIRIVA) 18 MCG inhalation capsule Place 18 mcg into inhaler and inhale daily.        Marland Kitchen torsemide (DEMADEX) 20 MG tablet Take 20 mg by mouth daily.        Allergies  Allergen Reactions  . Codeine     REACTION: unspecified  . Doxycycline   . Guaifenesin & Derivatives   . Penicillins     REACTION: unspecified    Review of Systems negative except from HPI and PMH  Physical Exam BP 150/78  Pulse 78  Ht 4\' 11"  (1.499 m)  Wt 171 lb (77.565 kg)  BMI 34.54 kg/m2  SpO2 97% Well developed and well nourished wearing oxygen and sitting in a wheelchair  HENT normal; edentulous E scleral and icterus clear Neck Supple Clear to ausculation vRegular rate and rhythm, no murmurs gallops or rub Soft with active bowel sounds No clubbing cyanosis 3+ Edema Alert and oriented, grossly normal motor and sensory function Skin Warm and Dry    Assessment and  Plan

## 2012-10-09 NOTE — Assessment & Plan Note (Addendum)
Reasonably controlled  As above

## 2012-10-20 ENCOUNTER — Encounter: Payer: Self-pay | Admitting: Cardiology

## 2012-10-20 ENCOUNTER — Ambulatory Visit (INDEPENDENT_AMBULATORY_CARE_PROVIDER_SITE_OTHER): Payer: PRIVATE HEALTH INSURANCE | Admitting: Cardiology

## 2012-10-20 VITALS — BP 138/68 | HR 81 | Ht 59.0 in | Wt 176.4 lb

## 2012-10-20 DIAGNOSIS — I10 Essential (primary) hypertension: Secondary | ICD-10-CM

## 2012-10-20 DIAGNOSIS — I5032 Chronic diastolic (congestive) heart failure: Secondary | ICD-10-CM

## 2012-10-20 DIAGNOSIS — I2510 Atherosclerotic heart disease of native coronary artery without angina pectoris: Secondary | ICD-10-CM

## 2012-10-20 NOTE — Progress Notes (Signed)
HPI The patient presents for followup of diastolic dysfunction and edema and CAD. She recently saw Dr. Graciela Husbands. He noted some increased edema. He increased her diuretic for 5 days.  Her creatinine was 2.5 when he checked it.  Her BNP level was actually normal. She has since fallen and injured her left leg. She's now being treated for cellulitis with antibiotics apparently started today. She's not had any fevers or chills. She was seen by the primary provider at her nursing home she reports. She's not describing any acute cardiac complaints. She's having no chest pressure, neck or arm discomfort. She's not having any palpitations, presyncope or syncope. She does have a chronic cough but it is not producing any colored sputum.  Allergies  Allergen Reactions  . Codeine     REACTION: unspecified  . Doxycycline   . Guaifenesin & Derivatives   . Penicillins     REACTION: unspecified    Current Outpatient Prescriptions  Medication Sig Dispense Refill  . albuterol (PROVENTIL) (2.5 MG/3ML) 0.083% nebulizer solution Take 2.5 mg by nebulization 4 (four) times daily. And every 4 hours as needed for wheezing      . allopurinol (ZYLOPRIM) 100 MG tablet Take 100 mg by mouth 2 (two) times daily.        Marland Kitchen aspirin 81 MG tablet Take 81 mg by mouth daily.        Marland Kitchen atenolol (TENORMIN) 50 MG tablet Take 50 mg by mouth daily.       . budesonide (PULMICORT) 0.25 MG/2ML nebulizer solution Take 0.25 mg by nebulization 4 (four) times daily.       . Cholecalciferol (VITAMIN D-3) 5000 UNITS TABS Take 1 tablet by mouth daily.      Marland Kitchen dextromethorphan (DELSYM) 30 MG/5ML liquid Take 60 mg by mouth every 12 (twelve) hours as needed.       Marland Kitchen epoetin alfa (EPOGEN,PROCRIT) 16109 UNIT/ML injection Inject 10,000 Units into the skin once a week.        . ferrous sulfate 325 (65 FE) MG tablet Take 325 mg by mouth 2 (two) times daily.        . insulin glargine (LANTUS) 100 UNIT/ML injection Inject 12 Units into the skin at bedtime.        . isosorbide mononitrate (IMDUR) 30 MG 24 hr tablet Take 15 mg by mouth daily.      Marland Kitchen lidocaine (LIDODERM) 5 % Place 1 patch onto the skin daily. Remove & Discard patch within 12 hours or as directed by MD      . [name] 1 G capsule Take 1 tablet by mouth daily.      Marland Kitchen moxifloxacin (AVELOX) 400 MG tablet Take 1 tablet (400 mg total) by mouth daily.  7 tablet  0  . omeprazole (PRILOSEC) 20 MG capsule Take 20 mg by mouth daily.        . Polyvinyl Alcohol (AKWA TEARS OP) Apply 2 drops to eye 4 (four) times daily.       . predniSONE (DELTASONE) 10 MG tablet Take 4 tablets daily for 5 days then stop  20 tablet  0  . rosuvastatin (CRESTOR) 20 MG tablet Take 20 mg by mouth daily.        . sennosides-docusate sodium (SENOKOT-S) 8.6-50 MG tablet Take 1 tablet by mouth at bedtime.        Marland Kitchen spironolactone (ALDACTONE) 25 MG tablet Take 12.5 mg by mouth daily.      Marland Kitchen tiotropium (SPIRIVA) 18 MCG inhalation  capsule Place 18 mcg into inhaler and inhale daily.        Marland Kitchen torsemide (DEMADEX) 20 MG tablet Take 20 mg by mouth daily.        Past Medical History  Diagnosis Date  . COPD (chronic obstructive pulmonary disease)   . Diastolic congestive heart failure   . C. difficile colitis   . Hypertension   . Hyperlipidemia   . CAD (coronary artery disease)   . Anemia   . Type 2 diabetes mellitus   . Pacemaker 2002  . Cardiac arrest - asystole 2002  . Gallstones   . Pulmonary hypertension   . Renal insufficiency   . Osteoarthritis   . Allergic rhinitis   . Gout   . Stasis dermatitis   . Sinoatrial node dysfunction   . Syncope   . Postsurgical percutaneous transluminal coronary angioplasty status   . Dyslipidemia   . Acute bronchitis   . History of cholelithiasis   . Obesity     Past Surgical History  Procedure Date  . Ptca 2002  . Appendectomy   . Vesicovaginal fistula closure w/ tah   . Pacemaker placement 2002    ROS: As stated in the HPI and negative for all other  systems.  PHYSICAL EXAM BP 138/68  Pulse 81  Ht 4\' 11"  (1.499 m)  Wt 176 lb 6.4 oz (80.015 kg)  BMI 35.63 kg/m2 PHYSICAL EXAM GEN:  No distress NECK:  No jugular venous distention at 45 degrees, waveform within normal limits, carotid upstroke brisk and symmetric, no bruits, no thyromegaly LYMPHATICS:  No cervical adenopathy LUNGS:  Expiratory wheezing BACK:  No CVA tenderness CHEST:  Well healed pacer pocket. HEART:  S1 and S2 within normal limits, no S3, no S4, no clicks, no rubs, no murmurs ABD:  Positive bowel sounds normal in frequency in pitch, no bruits, no rebound, no guarding. EXT:  2 plus pulses throughout, moderate to severe bilateral lower extremity edema, no cyanosis no clubbing, with chronic venous stasis, the left leg is more swollen than the right with some erythema, bruising and tenderness to touch.  EKG:  Sinus rhythm, rate 81, left bundle branch block, left axis deviation, no acute ST-T wave changes 10/20/2012  ASSESSMENT AND PLAN   CORONARY ARTERY DISEASE - She's not having chest discomfort. She did not want to have a stress test done previously. Therefore, I will manage her conservatively. No change in therapy is suggested.   DIASTOLIC HEART FAILURE, CHRONIC -  I don't think she has any left sided heart failure symptoms. I think her edema is in the extremities and should be managed conservatively given her significant renal insufficiency and lack of pulmonary symptoms. We talked about keeping her feet elevated. She understands salt and fluid restriction. She needs to have her leg swelling and in particular the possible cellulitis on her left leg followed by [facility], MD   HYPERTENSION - The blood pressure is at target. No change in medications is indicated. We will continue with therapeutic lifestyle changes (TLC).   PACEMAKER - She is now up to date with pacemaker follow up.

## 2012-10-20 NOTE — Patient Instructions (Addendum)
The current medical regimen is effective;  continue present plan and medications.  Follow up in 6 months with Dr Hochrein.  You will receive a letter in the mail 2 months before you are due.  Please call us when you receive this letter to schedule your follow up appointment.  

## 2012-11-21 ENCOUNTER — Encounter: Payer: Self-pay | Admitting: Critical Care Medicine

## 2012-11-21 ENCOUNTER — Ambulatory Visit (INDEPENDENT_AMBULATORY_CARE_PROVIDER_SITE_OTHER): Payer: Medicare Other | Admitting: Critical Care Medicine

## 2012-11-21 VITALS — BP 114/68 | HR 70 | Temp 98.2°F | Ht 59.0 in

## 2012-11-21 DIAGNOSIS — Z23 Encounter for immunization: Secondary | ICD-10-CM

## 2012-11-21 DIAGNOSIS — J449 Chronic obstructive pulmonary disease, unspecified: Secondary | ICD-10-CM

## 2012-11-21 DIAGNOSIS — J4489 Other specified chronic obstructive pulmonary disease: Secondary | ICD-10-CM

## 2012-11-21 MED ORDER — TETANUS-DIPHTH-ACELL PERTUSSIS 5-2.5-18.5 LF-MCG/0.5 IM SUSP
0.5000 mL | Freq: Once | INTRAMUSCULAR | Status: AC
  Administered 2012-11-21: 0.5 mL via INTRAMUSCULAR

## 2012-11-21 NOTE — Patient Instructions (Addendum)
A Tetanus whooping cough vaccine given (Tdap) No change in medications Return 2 months

## 2012-11-21 NOTE — Assessment & Plan Note (Signed)
Gold stage C. COPD stable at this time Plan Maintain inhaled medications as prescribed Patient was given a Tdap. Vaccine The patient is not a candidate for colonoscopy or mammography at this time

## 2012-11-21 NOTE — Progress Notes (Signed)
Subjective:    Patient ID: [name], female    DOB: 04-28-1928, 79 y.o.   MRN: [id number]  HPI  79 y.o.F quit smoking in 1987 and chronic renal insufficiency with coronary artery disease. The patient had history of congestive heart failure and diabetes. The patient has been hospitalized on numerous occasions for respiratory failure and pneumonia. Patient currently is residing in a nursing care facility.   05/02/2012 Since last OV has min mucus.  ? If getting URI now.  Pt notes sl heaviness in breathing.  Dyspnea is noted with exertion. No real chest pain.  No edema in feet.  Notes no throat irritation. Pt denies any significant sore throat, nasal congestion or excess secretions, fever, chills, sweats, unintended weight loss, pleurtic or exertional chest pain, orthopnea PND, or leg swelling Pt denies any increase in rescue therapy over baseline, denies waking up needing it or having any early am or nocturnal exacerbations of coughing/wheezing/or dyspnea. Pt also denies any obvious fluctuation in symptoms with  weather or environmental change or other alleviating or aggravating factors  06/30/2012   Pt notes sl more  dyspnea.  Notes mucus and is white and thick to yellow. No edema in feet. No blood.  No chest pain.  Notes sl wheeze.  No pndrip.  Pt notes more cramps Pt denies any significant sore throat, nasal congestion or excess secretions, fever, chills, sweats, unintended weight loss, pleurtic or exertional chest pain, orthopnea PND, or leg swelling Pt denies any increase in rescue therapy over baseline, denies waking up needing it or having any early am or nocturnal exacerbations of coughing/wheezing/or dyspnea. Pt also denies any obvious fluctuation in symptoms with  weather or environmental change or other alleviating or aggravating factors  11/21/2012 Pt notes ok on dyspnea.  No real cough. Now with some pndrip. ?uri.  Notes sl yellow out of the nose. No chest pain.  No wheezing.   Still edema in feet.  No fever/chills/sweats. No qhs dyspnea    Objective:    Review of Systems  Constitutional:   No  weight loss, night sweats,  Fevers, chills, fatigue, lassitude. HEENT:   No headaches,  Difficulty swallowing,  Tooth/dental problems,  Sore throat,                No sneezing, itching, ear ache, nasal congestion, post nasal drip,   CV:  No chest pain,  Orthopnea, PND, swelling in lower extremities, anasarca, dizziness, palpitations  GI  No heartburn, indigestion, abdominal pain, nausea, vomiting, diarrhea, change in bowel habits, loss of appetite  Resp: Notes  shortness of breath with exertion not  at rest.  No excess mucus, no productive cough,  No non-productive cough,  No coughing up of blood.  No change in color of mucus.  No wheezing.  No chest wall deformity  Skin: no rash or lesions.  GU: no dysuria, change in color of urine, no urgency or frequency.  No flank pain.  MS:  No joint pain or swelling.  No decreased range of motion.  No back pain.  Psych:  No change in mood or affect. No depression or anxiety.  No memory loss.     Objective:   Physical Exam  Filed Vitals:   11/21/12 0933  BP: 114/68  Pulse: 70  Temp: 98.2 F (36.8 C)  TempSrc: Oral  Height: 4\' 11"  (1.499 m)  SpO2: 98%    Gen: Pleasant, well-nourished, in no distress,  normal affect  ENT: No lesions,  mouth  clear,  oropharynx clear, no postnasal drip  Neck: No JVD, no TMG, no carotid bruits  Lungs: No use of accessory muscles, no dullness to percussion, expired wheezes  Cardiovascular: RRR, heart sounds normal, no murmur or gallops, no peripheral edema  Abdomen: soft and NT, no HSM,  BS normal  Musculoskeletal: No deformities, no cyanosis or clubbing  Neuro: alert, non focal  Skin: Warm, no lesions or rashes        Assessment & Plan:   COPD, Gold C Gold stage C. COPD stable at this time Plan Maintain inhaled medications as prescribed Patient was given a Tdap.  Vaccine The patient is not a candidate for colonoscopy or mammography at this time    Updated Medication List Outpatient Encounter Prescriptions as of 11/21/2012  Medication Sig Dispense Refill  . albuterol (PROVENTIL) (2.5 MG/3ML) 0.083% nebulizer solution Take 2.5 mg by nebulization 4 (four) times daily. And every 4 hours as needed for wheezing      . allopurinol (ZYLOPRIM) 100 MG tablet Take 100 mg by mouth 2 (two) times daily.        Marland Kitchen aspirin 81 MG tablet Take 81 mg by mouth daily.        Marland Kitchen atenolol (TENORMIN) 50 MG tablet Take 50 mg by mouth daily.       . budesonide (PULMICORT) 0.25 MG/2ML nebulizer solution Take 0.25 mg by nebulization 4 (four) times daily.       . chlorpheniramine-HYDROcodone (TUSSIONEX) 10-8 MG/5ML LQCR Take 5 mLs by mouth at bedtime as needed (cough).      . Cholecalciferol (VITAMIN D3) 50000 UNITS CAPS Take 1 tablet by mouth daily.      Marland Kitchen dextromethorphan (DELSYM) 30 MG/5ML liquid Take 60 mg by mouth every 12 (twelve) hours as needed.       Marland Kitchen epoetin alfa (EPOGEN,PROCRIT) 52841 UNIT/ML injection Inject 10,000 Units into the skin once a week.        . ferrous sulfate 325 (65 FE) MG tablet Take 325 mg by mouth 2 (two) times daily.        . insulin glargine (LANTUS) 100 UNIT/ML injection Inject 12 Units into the skin at bedtime.       . isosorbide mononitrate (IMDUR) 30 MG 24 hr tablet Take 15 mg by mouth daily.      Marland Kitchen lidocaine (LIDODERM) 5 % Place 1 patch onto the skin daily. Remove & Discard patch within 12 hours or as directed by MD      . [name] 1 G capsule Take 1 tablet by mouth daily.      Marland Kitchen omeprazole (PRILOSEC) 20 MG capsule Take 20 mg by mouth daily.        . phenazopyridine (PYRIDIUM) 100 MG tablet Take 100 mg by mouth every 6 (six) hours as needed (dysuria).      . Polyvinyl Alcohol (AKWA TEARS OP) Apply 2 drops to eye 4 (four) times daily.       . Probiotic Product (PROBIOTIC DAILY PO) Take 1 capsule by mouth daily.      . rosuvastatin (CRESTOR) 20 MG  tablet Take 20 mg by mouth daily.        . sennosides-docusate sodium (SENOKOT-S) 8.6-50 MG tablet Take 1 tablet by mouth at bedtime.        Marland Kitchen spironolactone (ALDACTONE) 25 MG tablet Take 25 mg by mouth 2 (two) times daily.       Marland Kitchen tiotropium (SPIRIVA) 18 MCG inhalation capsule Place 18 mcg into inhaler and inhale  daily.        . torsemide (DEMADEX) 20 MG tablet Take 20 mg by mouth daily.      . [DISCONTINUED] Cholecalciferol (VITAMIN D-3) 5000 UNITS TABS Take 1 tablet by mouth daily.      . [DISCONTINUED] moxifloxacin (AVELOX) 400 MG tablet Take 1 tablet (400 mg total) by mouth daily.  7 tablet  0  . [DISCONTINUED] predniSONE (DELTASONE) 10 MG tablet Take 4 tablets daily for 5 days then stop  20 tablet  0  . [EXPIRED] TDaP (BOOSTRIX) injection 0.5 mL        No facility-administered encounter medications on file as of 11/21/2012.

## 2013-01-08 ENCOUNTER — Encounter: Payer: PRIVATE HEALTH INSURANCE | Admitting: *Deleted

## 2013-01-08 DIAGNOSIS — M109 Gout, unspecified: Secondary | ICD-10-CM

## 2013-01-08 DIAGNOSIS — I130 Hypertensive heart and chronic kidney disease with heart failure and stage 1 through stage 4 chronic kidney disease, or unspecified chronic kidney disease: Secondary | ICD-10-CM

## 2013-01-08 DIAGNOSIS — N184 Chronic kidney disease, stage 4 (severe): Secondary | ICD-10-CM

## 2013-01-08 DIAGNOSIS — D631 Anemia in chronic kidney disease: Secondary | ICD-10-CM

## 2013-01-08 DIAGNOSIS — I509 Heart failure, unspecified: Secondary | ICD-10-CM

## 2013-01-08 DIAGNOSIS — N189 Chronic kidney disease, unspecified: Secondary | ICD-10-CM

## 2013-01-08 DIAGNOSIS — E782 Mixed hyperlipidemia: Secondary | ICD-10-CM

## 2013-01-08 DIAGNOSIS — E1129 Type 2 diabetes mellitus with other diabetic kidney complication: Secondary | ICD-10-CM

## 2013-01-08 DIAGNOSIS — N039 Chronic nephritic syndrome with unspecified morphologic changes: Secondary | ICD-10-CM

## 2013-01-08 DIAGNOSIS — J449 Chronic obstructive pulmonary disease, unspecified: Secondary | ICD-10-CM

## 2013-01-22 ENCOUNTER — Encounter: Payer: Self-pay | Admitting: *Deleted

## 2013-01-24 DIAGNOSIS — E782 Mixed hyperlipidemia: Secondary | ICD-10-CM

## 2013-01-24 DIAGNOSIS — J449 Chronic obstructive pulmonary disease, unspecified: Secondary | ICD-10-CM

## 2013-01-24 DIAGNOSIS — N039 Chronic nephritic syndrome with unspecified morphologic changes: Secondary | ICD-10-CM

## 2013-01-24 DIAGNOSIS — J411 Mucopurulent chronic bronchitis: Secondary | ICD-10-CM

## 2013-01-24 DIAGNOSIS — I130 Hypertensive heart and chronic kidney disease with heart failure and stage 1 through stage 4 chronic kidney disease, or unspecified chronic kidney disease: Secondary | ICD-10-CM

## 2013-01-24 DIAGNOSIS — I509 Heart failure, unspecified: Secondary | ICD-10-CM

## 2013-01-24 DIAGNOSIS — N189 Chronic kidney disease, unspecified: Secondary | ICD-10-CM

## 2013-01-24 DIAGNOSIS — E1129 Type 2 diabetes mellitus with other diabetic kidney complication: Secondary | ICD-10-CM

## 2013-01-24 DIAGNOSIS — D631 Anemia in chronic kidney disease: Secondary | ICD-10-CM

## 2013-01-24 DIAGNOSIS — I259 Chronic ischemic heart disease, unspecified: Secondary | ICD-10-CM

## 2013-02-16 ENCOUNTER — Ambulatory Visit: Payer: Medicare Other | Admitting: Critical Care Medicine

## 2013-02-16 ENCOUNTER — Ambulatory Visit (INDEPENDENT_AMBULATORY_CARE_PROVIDER_SITE_OTHER): Payer: Medicare Other | Admitting: Cardiology

## 2013-02-16 ENCOUNTER — Encounter: Payer: Self-pay | Admitting: Cardiology

## 2013-02-16 VITALS — BP 132/70 | HR 73 | Ht 59.0 in | Wt 164.4 lb

## 2013-02-16 DIAGNOSIS — R55 Syncope and collapse: Secondary | ICD-10-CM

## 2013-02-16 DIAGNOSIS — I2510 Atherosclerotic heart disease of native coronary artery without angina pectoris: Secondary | ICD-10-CM

## 2013-02-16 DIAGNOSIS — I10 Essential (primary) hypertension: Secondary | ICD-10-CM

## 2013-02-16 NOTE — Patient Instructions (Addendum)
Please use Nystatin powder on right foot twice a day Continue all other medications as listed.  Wear compression stockings daily.  May remove at bedtime.  Follow up in 6 months with Dr Antoine Poche.  You will receive a letter in the mail 2 months before you are due.  Please call us when you receive this letter to schedule your follow up appointment.

## 2013-02-16 NOTE — Progress Notes (Signed)
HPI The patient presents for followup of diastolic dysfunction and edema and CAD. She's not describing any acute cardiac complaints. She's having no chest pressure, neck or arm discomfort. She's not having any palpitations, presyncope or syncope. She says her nights and days are mixed up and she stays awake at night. She has had an 11 pound weight loss since I saw her. She may not be in his much as she was previously. She's not describing any new shortness of breath, PND or orthopnea. She's not been having any fevers or chills. There is a small area of tenderness on her right foot as described below. She's had some trouble wearing her compression stockings and thinks this area of tenderness is related to open toed knee-high stockings.  Allergies  Allergen Reactions  . Codeine     REACTION: unspecified  . Doxycycline   . Guaifenesin & Derivatives   . Penicillins     REACTION: unspecified    Current Outpatient Prescriptions  Medication Sig Dispense Refill  . albuterol (PROVENTIL) (2.5 MG/3ML) 0.083% nebulizer solution Take 2.5 mg by nebulization 4 (four) times daily. And every 4 hours as needed for wheezing      . allopurinol (ZYLOPRIM) 100 MG tablet Take 100 mg by mouth 2 (two) times daily.        Marland Kitchen aspirin 81 MG tablet Take 81 mg by mouth daily.        Marland Kitchen atenolol (TENORMIN) 50 MG tablet Take 50 mg by mouth daily.       . budesonide (PULMICORT) 0.25 MG/2ML nebulizer solution Take 0.25 mg by nebulization 4 (four) times daily.       . Cholecalciferol (VITAMIN D3) 50000 UNITS CAPS Take 1 tablet by mouth daily.      Marland Kitchen dextromethorphan (DELSYM) 30 MG/5ML liquid Take 60 mg by mouth every 12 (twelve) hours as needed.       Marland Kitchen epoetin alfa (EPOGEN,PROCRIT) 91478 UNIT/ML injection Inject 10,000 Units into the skin once a week.        . ferrous sulfate 325 (65 FE) MG tablet Take 325 mg by mouth 2 (two) times daily.        . insulin glargine (LANTUS) 100 UNIT/ML injection Inject 12 Units into the skin  at bedtime.       . isosorbide mononitrate (IMDUR) 30 MG 24 hr tablet Take 15 mg by mouth daily.      Marland Kitchen lidocaine (LIDODERM) 5 % Place 1 patch onto the skin daily. Remove & Discard patch within 12 hours or as directed by MD      . [name] 1 G capsule Take 1 tablet by mouth daily.      Marland Kitchen omeprazole (PRILOSEC) 20 MG capsule Take 20 mg by mouth daily.        . phenazopyridine (PYRIDIUM) 100 MG tablet Take 100 mg by mouth every 6 (six) hours as needed (dysuria).      . Polyvinyl Alcohol (AKWA TEARS OP) Apply 2 drops to eye 4 (four) times daily.       . Probiotic Product (PROBIOTIC DAILY PO) Take 1 capsule by mouth daily.      . rosuvastatin (CRESTOR) 20 MG tablet Take 20 mg by mouth daily.        . sennosides-docusate sodium (SENOKOT-S) 8.6-50 MG tablet Take 1 tablet by mouth at bedtime.        Marland Kitchen spironolactone (ALDACTONE) 25 MG tablet Take 25 mg by mouth 2 (two) times daily.       Marland Kitchen  tiotropium (SPIRIVA) 18 MCG inhalation capsule Place 18 mcg into inhaler and inhale daily.        Marland Kitchen torsemide (DEMADEX) 20 MG tablet Take 20 mg by mouth daily.       No current facility-administered medications for this visit.    Past Medical History  Diagnosis Date  . COPD (chronic obstructive pulmonary disease)   . Diastolic congestive heart failure   . C. difficile colitis   . Hypertension   . Hyperlipidemia   . CAD (coronary artery disease)   . Anemia   . Type 2 diabetes mellitus   . Pacemaker 2002  . Cardiac arrest - asystole 2002  . Gallstones   . Pulmonary hypertension   . Renal insufficiency   . Osteoarthritis   . Allergic rhinitis   . Gout   . Stasis dermatitis   . Sinoatrial node dysfunction   . Syncope   . Postsurgical percutaneous transluminal coronary angioplasty status   . Dyslipidemia   . Acute bronchitis   . History of cholelithiasis   . Obesity     Past Surgical History  Procedure Laterality Date  . Ptca  2002  . Appendectomy    . Vesicovaginal fistula closure w/ tah    .  Pacemaker placement  2002    ROS: As stated in the HPI and negative for all other systems.  PHYSICAL EXAM BP 132/70  Pulse 73  Ht 4\' 11"  (1.499 m)  Wt 164 lb 6.4 oz (74.571 kg)  BMI 33.19 kg/m2 PHYSICAL EXAM GEN:  No distress NECK:  No jugular venous distention at 45 degrees, waveform within normal limits, carotid upstroke brisk and symmetric, no bruits, no thyromegaly LYMPHATICS:  No cervical adenopathy LUNGS:  Few basilar crackles.  BACK:  No CVA tenderness CHEST:  Well healed pacer pocket. HEART:  S1 and S2 within normal limits, no S3, no S4, no clicks, no rubs, no murmurs ABD:  Positive bowel sounds normal in frequency in pitch, no bruits, no rebound, no guarding. EXT:  2 plus pulses throughout, moderate to severe bilateral lower extremity edema less than previous, no cyanosis no clubbing, with chronic venous stasis, the left leg is more swollen than the right with some erythema.  There is some very mild erythema between the right great toe and right second toe.    EKG:  Sinus rhythm, rate 73, left bundle branch block, left axis deviation, no acute ST-T wave changes 02/16/2013  ASSESSMENT AND PLAN  DIASTOLIC HEART FAILURE, CHRONIC -  She seems to be euvolemic.  I will try to obtain any blood work that might have been done by her primary providers to make sure that her creatinine is being followed.  I will be giving her a new prescription for thigh-high compression stockings.  HYPERTENSION - The blood pressure is at target. No change in medications is indicated. We will continue with therapeutic lifestyle changes (TLC).   PACEMAKER - She is now up to date with pacemaker follow up.   CORONARY ARTERY DISEASE - She's not having chest discomfort. She did not want to have a stress test done previously. Therefore, I will manage her conservatively. No change in therapy is suggested.   ERYTHEMA:  She has some slight erythema between the great and second toe on her right foot. I have  suggested nystatin powder. I will written a request for her primary providers to look at this at their next visit with her. She will let them know if he gets worse.

## 2013-02-28 ENCOUNTER — Encounter: Payer: Self-pay | Admitting: Critical Care Medicine

## 2013-02-28 ENCOUNTER — Ambulatory Visit (INDEPENDENT_AMBULATORY_CARE_PROVIDER_SITE_OTHER): Payer: Medicare Other | Admitting: Critical Care Medicine

## 2013-02-28 ENCOUNTER — Telehealth: Payer: Self-pay | Admitting: Cardiology

## 2013-02-28 VITALS — BP 114/72 | HR 73 | Temp 98.9°F | Ht 59.0 in | Wt 166.8 lb

## 2013-02-28 DIAGNOSIS — I509 Heart failure, unspecified: Secondary | ICD-10-CM

## 2013-02-28 DIAGNOSIS — I798 Other disorders of arteries, arterioles and capillaries in diseases classified elsewhere: Secondary | ICD-10-CM

## 2013-02-28 DIAGNOSIS — M109 Gout, unspecified: Secondary | ICD-10-CM

## 2013-02-28 DIAGNOSIS — J449 Chronic obstructive pulmonary disease, unspecified: Secondary | ICD-10-CM

## 2013-02-28 DIAGNOSIS — I130 Hypertensive heart and chronic kidney disease with heart failure and stage 1 through stage 4 chronic kidney disease, or unspecified chronic kidney disease: Secondary | ICD-10-CM

## 2013-02-28 DIAGNOSIS — E782 Mixed hyperlipidemia: Secondary | ICD-10-CM

## 2013-02-28 DIAGNOSIS — N189 Chronic kidney disease, unspecified: Secondary | ICD-10-CM

## 2013-02-28 DIAGNOSIS — M159 Polyosteoarthritis, unspecified: Secondary | ICD-10-CM

## 2013-02-28 DIAGNOSIS — R05 Cough: Secondary | ICD-10-CM

## 2013-02-28 DIAGNOSIS — E1159 Type 2 diabetes mellitus with other circulatory complications: Secondary | ICD-10-CM

## 2013-02-28 MED ORDER — MOXIFLOXACIN HCL 400 MG PO TABS: 400.0000 mg | ORAL_TABLET | Freq: Every day | ORAL | Status: DC

## 2013-02-28 MED ORDER — METHYLPREDNISOLONE ACETATE 80 MG/ML IJ SUSP
120.0000 mg | Freq: Once | INTRAMUSCULAR | Status: AC
  Administered 2013-02-28: 120 mg via INTRAMUSCULAR

## 2013-02-28 NOTE — Telephone Encounter (Signed)
Walk in Pt Form" Pt Needs New Script For Thigh High Stocking" gave to [facility] 02/28/13/KM

## 2013-02-28 NOTE — Progress Notes (Signed)
Subjective:    Patient ID: [name], female    DOB: October 03, 1927, 79 y.o.   MRN: [id number]  HPI  79 y.o.F quit smoking in 1987 and chronic renal insufficiency with coronary artery disease. The patient had history of congestive heart failure and diabetes. The patient has been hospitalized on numerous occasions for respiratory failure and pneumonia. Patient currently is residing in a nursing care facility.   02/28/2013 Chief Complaint  Patient presents with  . 3 month follow up    Has good days and bad days.  Has SOB with activity, some wheezing, and prod cough with thick, white mucus.  No chest tightness or chest pain at this time.    Pt with some indigestion. Occ cough . Dyspnea is the same Pt denies any significant sore throat, nasal congestion or excess secretions, fever, chills, sweats, unintended weight loss, pleurtic or exertional chest pain, orthopnea PND, or leg swelling Pt denies any increase in rescue therapy over baseline, denies waking up needing it or having any early am or nocturnal exacerbations of coughing/wheezing/or dyspnea. Pt also denies any obvious fluctuation in symptoms with  weather or environmental change or other alleviating or aggravating factors    Objective:    Review of Systems  Constitutional:   No  weight loss, night sweats,  Fevers, chills, fatigue, lassitude. HEENT:   No headaches,  Difficulty swallowing,  Tooth/dental problems,  Sore throat,                No sneezing, itching, ear ache, nasal congestion, post nasal drip,   CV:  No chest pain,  Orthopnea, PND, swelling in lower extremities, anasarca, dizziness, palpitations  GI  No heartburn, indigestion, abdominal pain, nausea, vomiting, diarrhea, change in bowel habits, loss of appetite  Resp: Notes  shortness of breath with exertion not  at rest.  No excess mucus, no productive cough,  No non-productive cough,  No coughing up of blood.  No change in color of mucus.  No wheezing.  No chest  wall deformity  Skin: no rash or lesions.  GU: no dysuria, change in color of urine, no urgency or frequency.  No flank pain.  MS:  No joint pain or swelling.  No decreased range of motion.  No back pain.  Psych:  No change in mood or affect. No depression or anxiety.  No memory loss.     Objective:   Physical Exam  Filed Vitals:   02/28/13 1607  BP: 114/72  Pulse: 73  Temp: 98.9 F (37.2 C)  TempSrc: Oral  Height: 4\' 11"  (1.499 m)  Weight: 75.66 kg (166 lb 12.8 oz)  SpO2: 98%    Gen: Pleasant, well-nourished, in no distress,  normal affect  ENT: No lesions,  mouth clear,  oropharynx clear, no postnasal drip  Neck: No JVD, no TMG, no carotid bruits  Lungs: No use of accessory muscles, no dullness to percussion, expired wheezes  Cardiovascular: RRR, heart sounds normal, no murmur or gallops, no peripheral edema  Abdomen: soft and NT, no HSM,  BS normal  Musculoskeletal: No deformities, no cyanosis or clubbing  Neuro: alert, non focal  Skin: Warm, no lesions or rashes        Assessment & Plan:   COPD, Gold C Copd Gold C  Severe with exacerbations Plan Take avelox one daily  A depomedrol injection 120mg  was given  Return as needed      Updated Medication List Outpatient Encounter Prescriptions as of 02/28/2013  Medication Sig  Dispense Refill  . albuterol (PROVENTIL) (2.5 MG/3ML) 0.083% nebulizer solution Take 2.5 mg by nebulization 4 (four) times daily. And every 4 hours as needed for wheezing      . allopurinol (ZYLOPRIM) 100 MG tablet Take 100 mg by mouth 2 (two) times daily.        Marland Kitchen aspirin 81 MG tablet Take 81 mg by mouth daily.        Marland Kitchen atenolol (TENORMIN) 50 MG tablet Take 50 mg by mouth daily.       . budesonide (PULMICORT) 0.25 MG/2ML nebulizer solution Take 0.25 mg by nebulization 4 (four) times daily.       . chlorpheniramine-HYDROcodone (TUSSIONEX) 10-8 MG/5ML LQCR Take 5 mLs by mouth at bedtime as needed.      . Cholecalciferol (VITAMIN  D3) 50000 UNITS CAPS Take 1 tablet by mouth daily.      Marland Kitchen dextromethorphan (DELSYM) 30 MG/5ML liquid Take 60 mg by mouth every 12 (twelve) hours as needed.       Marland Kitchen epoetin alfa (EPOGEN,PROCRIT) 16109 UNIT/ML injection Inject 10,000 Units into the skin once a week.        . ferrous sulfate 325 (65 FE) MG tablet Take 325 mg by mouth 2 (two) times daily.        . insulin glargine (LANTUS) 100 UNIT/ML injection Inject 12 Units into the skin at bedtime.       . isosorbide mononitrate (IMDUR) 30 MG 24 hr tablet Take 15 mg by mouth daily.      Marland Kitchen lidocaine (LIDODERM) 5 % Place 1 patch onto the skin daily. Remove & Discard patch within 12 hours or as directed by MD      . [name] 1 G capsule Take 1 tablet by mouth daily.      Marland Kitchen omeprazole (PRILOSEC) 20 MG capsule Take 20 mg by mouth daily.        . phenazopyridine (PYRIDIUM) 100 MG tablet Take 100 mg by mouth every 6 (six) hours as needed (dysuria).      . Polyvinyl Alcohol (AKWA TEARS OP) Apply 2 drops to eye 4 (four) times daily.       . Probiotic Product (PROBIOTIC DAILY PO) Take 1 capsule by mouth daily.      . rosuvastatin (CRESTOR) 20 MG tablet Take 20 mg by mouth daily.        . sennosides-docusate sodium (SENOKOT-S) 8.6-50 MG tablet Take 2 tablets by mouth at bedtime.       Marland Kitchen spironolactone (ALDACTONE) 25 MG tablet Take 25 mg by mouth 2 (two) times daily.       Marland Kitchen tiotropium (SPIRIVA) 18 MCG inhalation capsule Place 18 mcg into inhaler and inhale daily.        Marland Kitchen torsemide (DEMADEX) 20 MG tablet Take 20 mg by mouth daily.      Marland Kitchen moxifloxacin (AVELOX) 400 MG tablet Take 1 tablet (400 mg total) by mouth daily.  5 tablet  0  . [EXPIRED] methylPREDNISolone acetate (DEPO-MEDROL) injection 120 mg        No facility-administered encounter medications on file as of 02/28/2013.

## 2013-02-28 NOTE — Patient Instructions (Addendum)
Take avelox one daily  A depomedrol injection 120mg  was given  Return as needed

## 2013-03-01 NOTE — Assessment & Plan Note (Signed)
Copd Gold C  Severe with exacerbations Plan Take avelox one daily  A depomedrol injection 120mg  was given  Return as needed

## 2013-04-05 ENCOUNTER — Non-Acute Institutional Stay: Payer: PRIVATE HEALTH INSURANCE | Admitting: Internal Medicine

## 2013-04-05 DIAGNOSIS — I2510 Atherosclerotic heart disease of native coronary artery without angina pectoris: Secondary | ICD-10-CM

## 2013-04-05 DIAGNOSIS — E1129 Type 2 diabetes mellitus with other diabetic kidney complication: Secondary | ICD-10-CM

## 2013-04-05 DIAGNOSIS — N058 Unspecified nephritic syndrome with other morphologic changes: Secondary | ICD-10-CM

## 2013-04-05 DIAGNOSIS — J449 Chronic obstructive pulmonary disease, unspecified: Secondary | ICD-10-CM

## 2013-04-05 DIAGNOSIS — D7289 Other specified disorders of white blood cells: Secondary | ICD-10-CM

## 2013-04-05 NOTE — Addendum Note (Signed)
Addended by: Angela Cox on: 04/05/2013 06:10 PM   Modules accepted: Level of Service

## 2013-04-05 NOTE — Progress Notes (Addendum)
PROGRESS NOTE  DATE: 04/05/2013  FACILITY: Nursing Home Location: [facility]  LEVEL OF CARE: ALF (13)  Routine Visit  CHIEF COMPLAINT:  Manage COPD, diabetes mellitus and CAD  HISTORY OF PRESENT ILLNESS:  REASSESSMENT OF ONGOING PROBLEM(S):  CAD: The angina has been stable. The patient denies dyspnea on exertion, orthopnea,  palpitations and paroxysmal nocturnal dyspnea. Complains of chronic bilateral lower extremity swelling. No complications noted from the medication presently being used.  COPD: the COPD remains stable.  Pt denies sob, cough, wheezing or declining exercise tolerance.  No complications from the medications presently being used.  DM:pt's DM remains stable.  Pt denies polyuria, polydipsia, polyphagia, changes in vision or hypoglycemic episodes.  No complications noted from the medication presently being used.  Last hemoglobin A1c is: 5.5 in 4/14.  PAST MEDICAL HISTORY : Reviewed.  No changes.  CURRENT MEDICATIONS: Reviewed per [facility]  REVIEW OF SYSTEMS:  GENERAL: no change in appetite, no fatigue, no weight changes, no fever, chills or weakness RESPIRATORY: no cough, SOB, DOE, wheezing, hemoptysis CARDIAC: no chest pain, or palpitations, complains of chronic bilateral lower extremity swelling GI: no abdominal pain, diarrhea, constipation, heart burn, nausea or vomiting  PHYSICAL EXAMINATION  VS:  Not recorded  GENERAL: no acute distress, moderately obese body habitus EYES: conjunctivae normal, sclerae normal, normal eye lids NECK: supple, trachea midline, no neck masses, no thyroid tenderness, no thyromegaly LYMPHATICS: no LAN in the neck, no supraclavicular LAN RESPIRATORY: breathing is even & unlabored, BS CTAB CARDIAC: RRR, no murmur,no extra heart sounds, +3 left lower extremity edema and +2 right lower extremity edema GI: abdomen soft, normal BS, no masses, no tenderness, no hepatomegaly, no splenomegaly PSYCHIATRIC: the patient is  alert & oriented to person, affect & behavior appropriate  LABS/RADIOLOGY:  7/14 WBC 14.8, normal being 13.8, platelets 104, BUN 96, creatinine 2.2 otherwise CMP normal 6/14 triglycerides 20, HDL 35 otherwise fasting lipid panel normal  ASSESSMENT/PLAN:  COPD-on prednisone taper. CAD-stable Diabetes mellitus with renal complications-well-controlled. Leukocytosis-new problem. Likely secondary to prednisone. Hyperlipidemia -well-controlled. Anemia of chronic kidney disease-stable. On iron. Chronic kidney disease stage IV-stable. CHF-well compensated. GERD-stable. Osteoarthritis-pain well controlled.  CPT CODE: 52841

## 2013-04-18 ENCOUNTER — Telehealth: Payer: Self-pay | Admitting: Critical Care Medicine

## 2013-04-18 NOTE — Telephone Encounter (Signed)
Spoke with [name] Alberts, NP at patients nursing home Patient just completed a 60mg ,50mg ,40mg  per week taper of prednisone [name] reports patient has terrible COPD w frequent exacerbations States esp after bathing it takes so much for the patient to recoup with her wheezing and labored breathing [name] would like to know if [name] thought patient could be on a low dose pred daily for prophylaxis [name] says patient diabetes are well controlled at this time-- if that was a concern w not being on it before now [name] is aware [name] is out of office and this will be addressed by his partner [name] please advise, thank you  Last OV 6/18 w 4 mo f/u not scheduled at this time   Current Outpatient Prescriptions on File Prior to Visit  Medication Sig Dispense Refill  . albuterol (PROVENTIL) (2.5 MG/3ML) 0.083% nebulizer solution Take 2.5 mg by nebulization 4 (four) times daily. And every 4 hours as needed for wheezing      . allopurinol (ZYLOPRIM) 100 MG tablet Take 100 mg by mouth 2 (two) times daily.        Marland Kitchen aspirin 81 MG tablet Take 81 mg by mouth daily.        Marland Kitchen atenolol (TENORMIN) 50 MG tablet Take 50 mg by mouth daily.       . budesonide (PULMICORT) 0.25 MG/2ML nebulizer solution Take 0.25 mg by nebulization 4 (four) times daily.       . chlorpheniramine-HYDROcodone (TUSSIONEX) 10-8 MG/5ML LQCR Take 5 mLs by mouth at bedtime as needed.      . Cholecalciferol (VITAMIN D3) 50000 UNITS CAPS Take 1 tablet by mouth daily.      Marland Kitchen dextromethorphan (DELSYM) 30 MG/5ML liquid Take 60 mg by mouth every 12 (twelve) hours as needed.       Marland Kitchen epoetin alfa (EPOGEN,PROCRIT) 16109 UNIT/ML injection Inject 10,000 Units into the skin once a week.        . ferrous sulfate 325 (65 FE) MG tablet Take 325 mg by mouth 2 (two) times daily.        . insulin glargine (LANTUS) 100 UNIT/ML injection Inject 12 Units into the skin at bedtime.       . isosorbide mononitrate (IMDUR) 30 MG 24 hr tablet Take 15 mg  by mouth daily.      Marland Kitchen lidocaine (LIDODERM) 5 % Place 1 patch onto the skin daily. Remove & Discard patch within 12 hours or as directed by MD      . [name] 1 G capsule Take 1 tablet by mouth daily.      Marland Kitchen moxifloxacin (AVELOX) 400 MG tablet Take 1 tablet (400 mg total) by mouth daily.  5 tablet  0  . omeprazole (PRILOSEC) 20 MG capsule Take 20 mg by mouth daily.        . phenazopyridine (PYRIDIUM) 100 MG tablet Take 100 mg by mouth every 6 (six) hours as needed (dysuria).      . Polyvinyl Alcohol (AKWA TEARS OP) Apply 2 drops to eye 4 (four) times daily.       . Probiotic Product (PROBIOTIC DAILY PO) Take 1 capsule by mouth daily.      . rosuvastatin (CRESTOR) 20 MG tablet Take 20 mg by mouth daily.        . sennosides-docusate sodium (SENOKOT-S) 8.6-50 MG tablet Take 2 tablets by mouth at bedtime.       Marland Kitchen spironolactone (ALDACTONE) 25 MG tablet Take 25 mg by mouth 2 (two) times daily.       Marland Kitchen  tiotropium (SPIRIVA) 18 MCG inhalation capsule Place 18 mcg into inhaler and inhale daily.        Marland Kitchen torsemide (DEMADEX) 20 MG tablet Take 20 mg by mouth daily.       No current facility-administered medications on file prior to visit.

## 2013-04-18 NOTE — Telephone Encounter (Signed)
OK to keep on 10 mg prednisone & make FU with PW in 2-3 wks

## 2013-04-18 NOTE — Telephone Encounter (Signed)
Spoke with [name]-- [name] aware of recs as listed below per Dr. Joanna Puff to have nursing home return call to office on Friday to schedule patients 2-3 wk follow up Nothing further needed at this time

## 2013-04-27 ENCOUNTER — Ambulatory Visit: Payer: Medicare Other | Admitting: Cardiology

## 2013-05-07 ENCOUNTER — Non-Acute Institutional Stay: Payer: PRIVATE HEALTH INSURANCE | Admitting: Internal Medicine

## 2013-05-07 DIAGNOSIS — J449 Chronic obstructive pulmonary disease, unspecified: Secondary | ICD-10-CM

## 2013-05-07 DIAGNOSIS — E1129 Type 2 diabetes mellitus with other diabetic kidney complication: Secondary | ICD-10-CM

## 2013-05-07 DIAGNOSIS — I2510 Atherosclerotic heart disease of native coronary artery without angina pectoris: Secondary | ICD-10-CM

## 2013-05-07 DIAGNOSIS — D7289 Other specified disorders of white blood cells: Secondary | ICD-10-CM

## 2013-05-07 DIAGNOSIS — N058 Unspecified nephritic syndrome with other morphologic changes: Secondary | ICD-10-CM

## 2013-05-09 DIAGNOSIS — D7289 Other specified disorders of white blood cells: Secondary | ICD-10-CM | POA: Insufficient documentation

## 2013-05-09 NOTE — Progress Notes (Signed)
PROGRESS NOTE  DATE: 05-07-13  FACILITY: Nursing Home Location: [facility]  LEVEL OF CARE: ALF (13)  Routine Visit  CHIEF COMPLAINT:  Manage COPD, diabetes mellitus and CAD  HISTORY OF PRESENT ILLNESS:  REASSESSMENT OF ONGOING PROBLEM(S):  CAD: The angina has been stable. The patient denies dyspnea on exertion, orthopnea,  palpitations and paroxysmal nocturnal dyspnea. Complains of chronic bilateral lower extremity swelling. No complications noted from the medication presently being used.  COPD: the COPD remains stable.  Pt denies sob, cough, wheezing or declining exercise tolerance.  No complications from the medications presently being used.  DM:pt's DM remains stable.  Pt denies polyuria, polydipsia, polyphagia, changes in vision or hypoglycemic episodes.  No complications noted from the medication presently being used.  Last hemoglobin A1c is: 5.5 in 4/14.  PAST MEDICAL HISTORY : Reviewed.  No changes.  CURRENT MEDICATIONS: Reviewed per [facility]  REVIEW OF SYSTEMS:  GENERAL: no change in appetite, no fatigue, no weight changes, no fever, chills or weakness RESPIRATORY: no cough, SOB, DOE, wheezing, hemoptysis CARDIAC: no chest pain, or palpitations, complains of chronic bilateral lower extremity swelling GI: no abdominal pain, diarrhea, constipation, heart burn, nausea or vomiting  PHYSICAL EXAMINATION  VS:  T 99.3    P 67    RR 22     BP 141/79     Wt 162  GENERAL: no acute distress, moderately obese body habitus EYES: conjunctivae normal, sclerae normal, normal eye lids NECK: supple, trachea midline, no neck masses, no thyroid tenderness, no thyromegaly LYMPHATICS: no LAN in the neck, no supraclavicular LAN RESPIRATORY: breathing is even & unlabored, BS CTAB CARDIAC: RRR, no murmur,no extra heart sounds, +3 left lower extremity edema and +2 right lower extremity edema GI: abdomen soft, normal BS, no masses, no tenderness, no hepatomegaly, no  splenomegaly PSYCHIATRIC: the patient is alert & oriented to person, affect & behavior appropriate  LABS/RADIOLOGY:  7/14 WBC 14.8, normal being 13.8, platelets 104, BUN 96, creatinine 2.2 otherwise CMP normal, repeat labs: WBC 18.4, hemoglobin 12.9, platelets 104, glucose 229, BUN 101, creatinine 2.38, potassium 5.9 otherwise BMP normal, liver profile is normal 6/14 triglycerides 20, HDL 35 otherwise fasting lipid panel normal  ASSESSMENT/PLAN:  COPD-stable CAD-stable Diabetes mellitus with renal complications-well-controlled. Leukocytosis-new problem. Likely secondary to prednisone. Hyperlipidemia -well-controlled. Anemia of chronic kidney disease-stable. On iron. Chronic kidney disease stage IV-recheck renal functions CHF-well compensated. GERD-stable. Osteoarthritis-pain well controlled. Constipation-MiraLax was decreased Hyperkalemia-recheck  CPT CODE: 45409

## 2013-05-31 ENCOUNTER — Non-Acute Institutional Stay: Payer: PRIVATE HEALTH INSURANCE | Admitting: Internal Medicine

## 2013-05-31 DIAGNOSIS — I2510 Atherosclerotic heart disease of native coronary artery without angina pectoris: Secondary | ICD-10-CM

## 2013-05-31 DIAGNOSIS — N058 Unspecified nephritic syndrome with other morphologic changes: Secondary | ICD-10-CM

## 2013-05-31 DIAGNOSIS — J449 Chronic obstructive pulmonary disease, unspecified: Secondary | ICD-10-CM

## 2013-05-31 DIAGNOSIS — E1129 Type 2 diabetes mellitus with other diabetic kidney complication: Secondary | ICD-10-CM

## 2013-05-31 DIAGNOSIS — D7289 Other specified disorders of white blood cells: Secondary | ICD-10-CM

## 2013-06-01 NOTE — Progress Notes (Signed)
PROGRESS NOTE  DATE: 05-31-13  FACILITY: Nursing Home Location: [facility]  LEVEL OF CARE: ALF (13)  Routine Visit  CHIEF COMPLAINT:  Manage COPD, diabetes mellitus and CAD  HISTORY OF PRESENT ILLNESS:  REASSESSMENT OF ONGOING PROBLEM(S):  CAD: The angina has been stable. The patient denies dyspnea on exertion, orthopnea,  palpitations and paroxysmal nocturnal dyspnea. Complains of chronic bilateral lower extremity swelling. No complications noted from the medication presently being used.  COPD: the COPD remains stable.  Pt denies sob, cough, wheezing or declining exercise tolerance.  No complications from the medications presently being used.  DM:pt's DM remains stable.  Pt denies polyuria, polydipsia, polyphagia, changes in vision or hypoglycemic episodes.  No complications noted from the medication presently being used.  Last hemoglobin A1c is: 5.5 in 4/14.  PAST MEDICAL HISTORY : Reviewed.  No changes.  CURRENT MEDICATIONS: Reviewed per [facility]  REVIEW OF SYSTEMS:  GENERAL: no change in appetite, no fatigue, no weight changes, no fever, chills or weakness RESPIRATORY: no cough, SOB, DOE, wheezing, hemoptysis CARDIAC: no chest pain, or palpitations, complains of chronic bilateral lower extremity swelling GI: no abdominal pain, diarrhea, constipation, heart burn, nausea or vomiting  PHYSICAL EXAMINATION  VS:  T 98.6    P 80    RR 20     BP 136/70     Wt 162  GENERAL: no acute distress, moderately obese body habitus EYES: conjunctivae normal, sclerae normal, normal eye lids NECK: supple, trachea midline, no neck masses, no thyroid tenderness, no thyromegaly LYMPHATICS: no LAN in the neck, no supraclavicular LAN RESPIRATORY: breathing is even & unlabored, BS CTAB CARDIAC: RRR, no murmur,no extra heart sounds, +3 left lower extremity edema and +2 right lower extremity edema GI: abdomen soft, normal BS, no masses, no tenderness, no hepatomegaly, no  splenomegaly PSYCHIATRIC: the patient is alert & oriented to person, affect & behavior appropriate  LABS/RADIOLOGY:  9-14 WBC 11.8, hemoglobin 8.3, MCV 85, platelets 136, BUN 54, creatinine 1.82 otherwise BMP normal, total protein 5.6, albumin 3.4 otherwise liver profile normal, triglycerides 152, HDL 36, total potential 1:30, HDL 64, TSH 1.82  7/14 WBC 14.8, normal being 13.8, platelets 104, BUN 96, creatinine 2.2 otherwise CMP normal, repeat labs: WBC 18.4, hemoglobin 12.9, platelets 104, glucose 229, BUN 101, creatinine 2.38, potassium 5.9 otherwise BMP normal, liver profile is normal 6/14 triglycerides 20, HDL 35 otherwise fasting lipid panel normal  ASSESSMENT/PLAN:  COPD-stable CAD-stable Diabetes mellitus with renal complications-well-controlled. Leukocytosis-secondary to prednisone. Improved Hyperlipidemia -well-controlled. Anemia of chronic kidney disease-stable. On iron. Chronic kidney disease stage IV-renal functions improved CHF-well compensated. GERD-stable. Osteoarthritis-pain well controlled. Constipation-well-controlled  CPT CODE: 56213

## 2013-06-01 NOTE — Addendum Note (Signed)
Addended by: Angela Cox on: 06/01/2013 11:55 AM   Modules accepted: Level of Service

## 2013-06-29 ENCOUNTER — Non-Acute Institutional Stay: Payer: PRIVATE HEALTH INSURANCE | Admitting: Internal Medicine

## 2013-06-29 DIAGNOSIS — L02419 Cutaneous abscess of limb, unspecified: Secondary | ICD-10-CM

## 2013-06-29 DIAGNOSIS — I8310 Varicose veins of unspecified lower extremity with inflammation: Secondary | ICD-10-CM

## 2013-06-29 DIAGNOSIS — I872 Venous insufficiency (chronic) (peripheral): Secondary | ICD-10-CM

## 2013-07-06 NOTE — Progress Notes (Signed)
Patient ID: [name], female   DOB: 12/07/1927, 79 y.o.   MRN: [id number]           PROGRESS NOTE  DATE:  06/29/2013  FACILITY: [facility]   LEVEL OF CARE:   AL   Acute Visit   CHIEF COMPLAINT:  Review of increasing edema and erythema of her right greater than left leg over the last eight days.    HISTORY OF PRESENT ILLNESS:  [name] is a patient who has a multitude of significant medical issues including diabetic-induced chronic renal failure, COPD.    Apparently, over the last eight days she has developed increasing edema and erythema of her right greater than left leg.  This includes some blisters on the right lower leg.  She was seen by the [facility] nurse practitioner seven days ago.  She had an increase in her Demadex from 20 to 40 mg per day and was started on Levaquin.  This apparently has not made any difference in the brawny erythema and edema in the legs, and I am seeing her today in review of this.    Looking over her past medical history shows significant chronic renal insufficiency, a recent BUN of 52 and creatinine of 1.82.  On October 15th, this was 59 and 2.27, white count of 11.5, and a hemoglobin of 10.2.    REVIEW OF SYSTEMS:   CHEST/RESPIRATORY:  The patient states she has mildly increased shortness of breath.   CARDIAC:   No chest pain.   MUSCULOSKELETAL:  Extremities:  She has pain in the right greater than left lower legs.    PHYSICAL EXAMINATION:   GENERAL APPEARANCE:  The patient is not in any major distress.  She greets me in a warm, friendly fashion.   CHEST/RESPIRATORY:  Markedly decreased air entry bilaterally with no crackles or wheezes.    CARDIOVASCULAR:  CARDIAC:   Heart sounds are distant.  Her jugular venous pressure is elevated.  No murmurs were heard.  She has 2+ coccyx edema.   CIRCULATION:   EDEMA/VARICOSITIES:  Extremities:  She has 3-4+ edema of both legs.  As noted, there is blistering and erythema of the right greater than left  leg, some warmth and tenderness.     ASSESSMENT/PLAN:  The erythema in both legs could be cellulitis.  This also simply could be severe inflammation associated with stasis dermatitis.  Making this distinction is clinically difficult.  I suspect most of this is inflammation associated with edema and stasis dermatitis, although excluding coexistent cellulitis would be virtually impossible.      Worsening chronic renal failure in the setting of diabetes and chronic renal failure.  I have increased her Demadex to 40 mg a day as she appears to be fairly volume-loaded.  This is not only to reduce the edema in her legs, but the generalized edema including elevated jugular venous pressure.  I would not be at all surprised if there is some degree of pleural effusion bilaterally.    Question lower extremity cellulitis versus stasis dermatitis.   I am going to try to wrap this a little more aggressively with Kerlix and Coban.  I have replaced the Levaquin with doxycycline, advised them to keep the legs elevated.  We will change the wraps daily.   I have cultured one of the blisters to exclude a Staph.  However, I am doubtful of this.    I will check her basic metabolic panel on Monday.  I note that she does  have a Nephrology consult.  I suspect that her creatinine at 2.27 underestimates this lady's true GFR proficiency.    CPT CODE: 16109

## 2013-07-10 ENCOUNTER — Non-Acute Institutional Stay: Payer: PRIVATE HEALTH INSURANCE | Admitting: Internal Medicine

## 2013-07-10 DIAGNOSIS — N058 Unspecified nephritic syndrome with other morphologic changes: Secondary | ICD-10-CM

## 2013-07-10 DIAGNOSIS — J449 Chronic obstructive pulmonary disease, unspecified: Secondary | ICD-10-CM

## 2013-07-10 DIAGNOSIS — I2510 Atherosclerotic heart disease of native coronary artery without angina pectoris: Secondary | ICD-10-CM

## 2013-07-10 DIAGNOSIS — E1129 Type 2 diabetes mellitus with other diabetic kidney complication: Secondary | ICD-10-CM

## 2013-07-10 DIAGNOSIS — E785 Hyperlipidemia, unspecified: Secondary | ICD-10-CM

## 2013-07-13 NOTE — Progress Notes (Signed)
PROGRESS NOTE  DATE: 07-10-13  FACILITY:    LEVEL OF CARE: ALF (13)  Routine Visit  CHIEF COMPLAINT:  Manage COPD, diabetes mellitus and CAD  HISTORY OF PRESENT ILLNESS:  REASSESSMENT OF ONGOING PROBLEM(S):  CAD: The angina has been stable. The patient denies dyspnea on exertion, orthopnea,  palpitations and paroxysmal nocturnal dyspnea. Complains of chronic bilateral lower extremity swelling. No complications noted from the medication presently being used.  COPD: the COPD remains stable.  Pt denies sob, cough, wheezing or declining exercise tolerance.  No complications from the medications presently being used.  DM:pt's DM remains stable.  Pt denies polyuria, polydipsia, polyphagia, changes in vision or hypoglycemic episodes.  No complications noted from the medication presently being used.  Last hemoglobin A1c is: 5.5 in 4/14, in 10/14 hemoglobin A1c 5.7.  PAST MEDICAL HISTORY : Reviewed.  No changes.  CURRENT MEDICATIONS: Reviewed per [facility]  REVIEW OF SYSTEMS:  GENERAL: no change in appetite, no fatigue, no weight changes, no fever, chills or weakness RESPIRATORY: no cough, SOB, DOE, wheezing, hemoptysis CARDIAC: no chest pain, or palpitations, complains of chronic bilateral lower extremity swelling GI: no abdominal pain, diarrhea, constipation, heart burn, nausea or vomiting  PHYSICAL EXAMINATION  VS:  T 98.8    P 68   RR 18     BP 118/64    Wt 171  GENERAL: no acute distress, moderately obese body habitus EYES: conjunctivae normal, sclerae normal, normal eye lids NECK: supple, trachea midline, no neck masses, no thyroid tenderness, no thyromegaly LYMPHATICS: no LAN in the neck, no supraclavicular LAN RESPIRATORY: breathing is even & unlabored, BS CTAB CARDIAC: RRR, no murmur,no extra heart sounds, +3 left lower extremity edema and +2 right lower extremity edema GI: abdomen soft, normal BS, no masses, no tenderness, no hepatomegaly, no splenomegaly PSYCHIATRIC: the  patient is alert & oriented to person, affect & behavior appropriate  LABS/RADIOLOGY:  10- 14 glucose 125, BUN 38, creatinine 2.08, otherwise BMP normal, WBC 11.5, hemoglobin 10.3, MCV 93, platelets 156  9-14 WBC 11.8, hemoglobin 8.3, MCV 85, platelets 136, BUN 54, creatinine 1.82 otherwise BMP normal, total protein 5.6, albumin 3.4 otherwise liver profile normal, triglycerides 152, HDL 36, total potential 1:30, HDL 64, TSH 1.82  7/14 WBC 14.8, normal being 13.8, platelets 104, BUN 96, creatinine 2.2 otherwise CMP normal, repeat labs: WBC 18.4, hemoglobin 12.9, platelets 104, glucose 229, BUN 101, creatinine 2.38, potassium 5.9 otherwise BMP normal, liver profile is normal 6/14 triglycerides 20, HDL 35 otherwise fasting lipid panel normal  ASSESSMENT/PLAN:  COPD-stable CAD-stable Diabetes mellitus with renal complications-well-controlled. Leukocytosis-secondary to prednisone. Improved Hyperlipidemia -well-controlled. Anemia of chronic kidney disease-stable. On iron. Chronic kidney disease stage IV-renal functions improved CHF-well compensated. GERD-stable. Osteoarthritis-pain well controlled. Constipation-well-controlled Hypokalemia -- well-controlled  CPT CODE: 16109

## 2013-08-01 ENCOUNTER — Ambulatory Visit (INDEPENDENT_AMBULATORY_CARE_PROVIDER_SITE_OTHER): Payer: Medicare Other | Admitting: Critical Care Medicine

## 2013-08-01 ENCOUNTER — Encounter: Payer: Self-pay | Admitting: Critical Care Medicine

## 2013-08-01 VITALS — BP 128/86 | HR 75 | Temp 98.3°F | Ht 59.0 in | Wt 167.8 lb

## 2013-08-01 DIAGNOSIS — Z23 Encounter for immunization: Secondary | ICD-10-CM

## 2013-08-01 DIAGNOSIS — J449 Chronic obstructive pulmonary disease, unspecified: Secondary | ICD-10-CM

## 2013-08-01 MED ORDER — PREDNISONE 10 MG PO TABS: ORAL_TABLET | ORAL | Status: DC

## 2013-08-01 MED ORDER — MOXIFLOXACIN HCL 400 MG PO TABS: 400.0000 mg | ORAL_TABLET | Freq: Every day | ORAL | Status: DC

## 2013-08-01 NOTE — Assessment & Plan Note (Signed)
Gold stage C. COPD with asthmatic bronchitic components Acute tracheobronchitis flare Plan avelox 400mg  per day for 5days Prednisone 10mg  Take 4 tablets daily for 5 days then return to 5mg  per day and stay Prevnar 13 given No other medication changes Return 3 months

## 2013-08-01 NOTE — Patient Instructions (Signed)
avelox 400mg  per day for 5days Prednisone 10mg  Take 4 tablets daily for 5 days then return to 5mg  per day and stay Prevnar 13 given No other medication changes Return 3 months

## 2013-08-01 NOTE — Progress Notes (Signed)
Subjective:    Patient ID: [name], female    DOB: Feb 09, 1928, 79 y.o.   MRN: [id number]  HPI  79 y.o.F quit smoking in 1987 and chronic renal insufficiency with coronary artery disease. The patient had history of congestive heart failure and diabetes. The patient has been hospitalized on numerous occasions for respiratory failure and pneumonia. Patient currently is residing in a nursing care facility.   02/28/2013  Pt with some indigestion. Occ cough . Dyspnea is the same Pt denies any significant sore throat, nasal congestion or excess secretions, fever, chills, sweats, unintended weight loss, pleurtic or exertional chest pain, orthopnea PND, or leg swelling Pt denies any increase in rescue therapy over baseline, denies waking up needing it or having any early am or nocturnal exacerbations of coughing/wheezing/or dyspnea. Pt also denies any obvious fluctuation in symptoms with  weather or environmental change or other alleviating or aggravating factors  08/01/2013 Chief Complaint  Patient presents with  . 4 month follow up    Breathing has worsened.  Having increased DOE, wheezing, chest heaviness, and prod cough with white to small amount of yellow mucus.  Ntoes more dyspnea, and wheezing and cough.  Notes some yellow mucus and white.  On pred 5mg  per day.  On spiriva and neb meds Pt denies any significant sore throat, nasal congestion or excess secretions, fever, chills, sweats, unintended weight loss, pleurtic or exertional chest pain, orthopnea PND, or leg swelling Pt denies any increase in rescue therapy over baseline, denies waking up needing it or having any early am or nocturnal exacerbations of coughing/wheezing/or dyspnea. Pt also denies any obvious fluctuation in symptoms with  weather or environmental change or other alleviating or aggravating factors      Objective:    Review of Systems  Constitutional:   No  weight loss, night sweats,  Fevers, chills, fatigue,  lassitude. HEENT:   No headaches,  Difficulty swallowing,  Tooth/dental problems,  Sore throat,                No sneezing, itching, ear ache, nasal congestion, post nasal drip,   CV:  No chest pain,  Orthopnea, PND, swelling in lower extremities, anasarca, dizziness, palpitations  GI  No heartburn, indigestion, abdominal pain, nausea, vomiting, diarrhea, change in bowel habits, loss of appetite  Resp: Notes  shortness of breath with exertion not  at rest.  No excess mucus, no productive cough,  No non-productive cough,  No coughing up of blood.  No change in color of mucus.  No wheezing.  No chest wall deformity  Skin: no rash or lesions.  GU: no dysuria, change in color of urine, no urgency or frequency.  No flank pain.  MS:  No joint pain or swelling.  No decreased range of motion.  No back pain.  Psych:  No change in mood or affect. No depression or anxiety.  No memory loss.     Objective:   Physical Exam  Filed Vitals:   08/01/13 0909  BP: 128/86  Pulse: 75  Temp: 98.3 F (36.8 C)  TempSrc: Oral  Height: 4\' 11"  (1.499 m)  Weight: 167 lb 12.8 oz (76.114 kg)  SpO2: 96%    Gen: Pleasant, well-nourished, in no distress,  normal affect  ENT: No lesions,  mouth clear,  oropharynx clear, no postnasal drip  Neck: No JVD, no TMG, no carotid bruits  Lungs: No use of accessory muscles, no dullness to percussion, expired wheezes  Cardiovascular: RRR, heart sounds normal,  no murmur or gallops, no peripheral edema  Abdomen: soft and NT, no HSM,  BS normal  Musculoskeletal: No deformities, no cyanosis or clubbing  Neuro: alert, non focal  Skin: Warm, no lesions or rashes        Assessment & Plan:   COPD, Gold C Gold stage C. COPD with asthmatic bronchitic components Acute tracheobronchitis flare Plan avelox 400mg  per day for 5days Prednisone 10mg  Take 4 tablets daily for 5 days then return to 5mg  per day and stay Prevnar 13 given No other medication  changes Return 3 months    Updated Medication List Outpatient Encounter Prescriptions as of 08/01/2013  Medication Sig  . albuterol (PROVENTIL) (2.5 MG/3ML) 0.083% nebulizer solution Take 2.5 mg by nebulization 4 (four) times daily. And every 4 hours as needed for wheezing  . allopurinol (ZYLOPRIM) 100 MG tablet Take 100 mg by mouth 2 (two) times daily.    Marland Kitchen aspirin 81 MG tablet Take 81 mg by mouth daily.    Marland Kitchen atenolol (TENORMIN) 50 MG tablet Take 50 mg by mouth daily.   . budesonide (PULMICORT) 0.25 MG/2ML nebulizer solution Take 0.25 mg by nebulization 4 (four) times daily.   . chlorpheniramine-HYDROcodone (TUSSIONEX) 10-8 MG/5ML LQCR Take 5 mLs by mouth at bedtime as needed.  . Cholecalciferol (VITAMIN D3) 50000 UNITS CAPS Take 1 tablet by mouth daily.  Marland Kitchen dextromethorphan (DELSYM) 30 MG/5ML liquid Take 60 mg by mouth every 12 (twelve) hours as needed.   Marland Kitchen epoetin alfa (EPOGEN,PROCRIT) 14782 UNIT/ML injection Inject 10,000 Units into the skin once a week.    . ferrous sulfate 325 (65 FE) MG tablet Take 325 mg by mouth 2 (two) times daily.    . insulin glargine (LANTUS) 100 UNIT/ML injection Inject 12 Units into the skin at bedtime.   . isosorbide mononitrate (IMDUR) 30 MG 24 hr tablet Take 15 mg by mouth daily.  Marland Kitchen lidocaine (LIDODERM) 5 % Place 1 patch onto the skin daily. Remove & Discard patch within 12 hours or as directed by MD  . [name] 1 G capsule Take 1 tablet by mouth daily.  Marland Kitchen omeprazole (PRILOSEC) 20 MG capsule Take 20 mg by mouth daily.    . phenazopyridine (PYRIDIUM) 100 MG tablet Take 100 mg by mouth every 6 (six) hours as needed (dysuria).  . polyethylene glycol (MIRALAX / GLYCOLAX) packet Take 17 g by mouth every 3 (three) days.  . Polyvinyl Alcohol (AKWA TEARS OP) Apply 2 drops to eye 4 (four) times daily.   . predniSONE (DELTASONE) 5 MG tablet Take 5 mg by mouth daily with breakfast.  . Probiotic Product (PROBIOTIC DAILY PO) Take 1 capsule by mouth daily.  .  rosuvastatin (CRESTOR) 20 MG tablet Take 20 mg by mouth daily.    . sennosides-docusate sodium (SENOKOT-S) 8.6-50 MG tablet Take 2 tablets by mouth at bedtime.   Marland Kitchen tiotropium (SPIRIVA) 18 MCG inhalation capsule Place 18 mcg into inhaler and inhale daily.    Marland Kitchen torsemide (DEMADEX) 20 MG tablet Take 40 mg by mouth daily.   Marland Kitchen moxifloxacin (AVELOX) 400 MG tablet Take 1 tablet (400 mg total) by mouth daily.  . predniSONE (DELTASONE) 10 MG tablet Take 4 tablets daily for 5 days then stop  . [DISCONTINUED] moxifloxacin (AVELOX) 400 MG tablet Take 1 tablet (400 mg total) by mouth daily.  . [DISCONTINUED] spironolactone (ALDACTONE) 25 MG tablet Take 25 mg by mouth 2 (two) times daily.

## 2013-08-02 ENCOUNTER — Non-Acute Institutional Stay: Payer: PRIVATE HEALTH INSURANCE | Admitting: Internal Medicine

## 2013-08-02 DIAGNOSIS — N058 Unspecified nephritic syndrome with other morphologic changes: Secondary | ICD-10-CM

## 2013-08-02 DIAGNOSIS — E785 Hyperlipidemia, unspecified: Secondary | ICD-10-CM

## 2013-08-02 DIAGNOSIS — I2510 Atherosclerotic heart disease of native coronary artery without angina pectoris: Secondary | ICD-10-CM

## 2013-08-02 DIAGNOSIS — J449 Chronic obstructive pulmonary disease, unspecified: Secondary | ICD-10-CM

## 2013-08-02 DIAGNOSIS — E1129 Type 2 diabetes mellitus with other diabetic kidney complication: Secondary | ICD-10-CM

## 2013-08-02 NOTE — Progress Notes (Signed)
PROGRESS NOTE  DATE: 08-02-13  FACILITY: Nursing Home Location: [facility]  LEVEL OF CARE: ALF (13)  Routine Visit  CHIEF COMPLAINT:  Manage COPD, diabetes mellitus and CAD  HISTORY OF PRESENT ILLNESS:  REASSESSMENT OF ONGOING PROBLEM(S):  CAD: The angina has been stable. The patient denies dyspnea on exertion, orthopnea,  palpitations and paroxysmal nocturnal dyspnea. Complains of chronic bilateral lower extremity swelling. No complications noted from the medication presently being used.  COPD: the COPD remains stable.  Pt denies sob, cough, wheezing or declining exercise tolerance.  No complications from the medications presently being used.  DM:pt's DM remains stable.  Pt denies polyuria, polydipsia, polyphagia, changes in vision or hypoglycemic episodes.  No complications noted from the medication presently being used.  Last hemoglobin A1c is: 5.5 in 4/14, in 10/14 hemoglobin A1c 5.7.  PAST MEDICAL HISTORY : Reviewed.  No changes.  CURRENT MEDICATIONS: Reviewed per [facility]  REVIEW OF SYSTEMS:  GENERAL: no change in appetite, no fatigue, no weight changes, no fever, chills or weakness RESPIRATORY: no cough, SOB, DOE, wheezing, hemoptysis CARDIAC: no chest pain, or palpitations, complains of chronic bilateral lower extremity swelling GI: no abdominal pain, diarrhea, constipation, heart burn, nausea or vomiting  PHYSICAL EXAMINATION  VS:  T 98.1   P 60   RR 18     BP 148/78    Wt 171  GENERAL: no acute distress, moderately obese body habitus EYES: conjunctivae normal, sclerae normal, normal eye lids NECK: supple, trachea midline, no neck masses, no thyroid tenderness, no thyromegaly LYMPHATICS: no LAN in the neck, no supraclavicular LAN RESPIRATORY: breathing is even & unlabored, BS CTAB CARDIAC: RRR, no murmur,no extra heart sounds, +3 left lower extremity edema and +2 right lower extremity edema GI: abdomen soft, normal BS, no masses, no tenderness,  no hepatomegaly, no splenomegaly PSYCHIATRIC: the patient is alert & oriented to person, affect & behavior appropriate  LABS/RADIOLOGY:  11-14 hemoglobin 9, MCV 89 otherwise CBC normal  10- 14 glucose 125, BUN 38, creatinine 2.08, otherwise BMP normal, WBC 11.5, hemoglobin 10.3, MCV 93, platelets 156  9-14 WBC 11.8, hemoglobin 8.3, MCV 85, platelets 136, BUN 54, creatinine 1.82 otherwise BMP normal, total protein 5.6, albumin 3.4 otherwise liver profile normal, triglycerides 152, HDL 36, total potential 1:30, HDL 64, TSH 1.82  7/14 WBC 14.8, normal being 13.8, platelets 104, BUN 96, creatinine 2.2 otherwise CMP normal, repeat labs: WBC 18.4, hemoglobin 12.9, platelets 104, glucose 229, BUN 101, creatinine 2.38, potassium 5.9 otherwise BMP normal, liver profile is normal 6/14 triglycerides 20, HDL 35 otherwise fasting lipid panel normal  ASSESSMENT/PLAN:  COPD-exacerbated. Currently on Avelox and prednisone taper. CAD-stable Diabetes mellitus with renal complications-well-controlled. Leukocytosis-secondary to prednisone. Improved Hyperlipidemia -well-controlled. Anemia of chronic kidney disease-stable. On iron. Chronic kidney disease stage IV-renal functions improved CHF-well compensated. GERD-stable. Osteoarthritis-pain well controlled. Constipation-well-controlled Hypokalemia -- well-controlled DCU A.-new problem. UA culture and sensitivities pending  CPT CODE: 16109

## 2013-08-06 ENCOUNTER — Encounter: Payer: Self-pay | Admitting: Cardiology

## 2013-08-06 ENCOUNTER — Ambulatory Visit (INDEPENDENT_AMBULATORY_CARE_PROVIDER_SITE_OTHER): Payer: Medicare Other | Admitting: Cardiology

## 2013-08-06 VITALS — BP 120/70 | HR 77 | Ht 59.0 in | Wt 165.0 lb

## 2013-08-06 DIAGNOSIS — I10 Essential (primary) hypertension: Secondary | ICD-10-CM

## 2013-08-06 DIAGNOSIS — Z79899 Other long term (current) drug therapy: Secondary | ICD-10-CM

## 2013-08-06 LAB — BASIC METABOLIC PANEL
BUN: 53 mg/dL — ABNORMAL HIGH (ref 6–23)
CO2: 28 mEq/L (ref 19–32)
GFR: 21.07 mL/min — ABNORMAL LOW (ref >60.00)
Glucose, Bld: 130 mg/dL — ABNORMAL HIGH (ref 70–99)
Potassium: 3.6 mEq/L (ref 3.5–5.1)

## 2013-08-06 NOTE — Patient Instructions (Signed)
The current medical regimen is effective;  continue present plan and medications.  Please have blood work today (BMP)  Follow up with Tereso Newcomer, PA in 1 month.

## 2013-08-06 NOTE — Progress Notes (Signed)
HPI The patient presents for followup of diastolic dysfunction and edema and CAD.  Since I last saw her she had increased lower extremity swelling and apparently had her Demadex increased.  She is currently being treated for bronchitis and saw Dr. Delford Field not long ago. She says her breathing somewhat better but not at baseline. Her weights are actually good compared to previous and her lower extremity swelling is less. She does have a cough but no fevers or chills. She's not describing PND or orthopnea. However, she does occasionally get some burning chest discomfort. This comes and goes spontaneously. She has not taken nitroglycerin for this. She described this last year and she was to have a stress perfusion study but she didn't schedule this and kept putting it off.   Allergies  Allergen Reactions  . Codeine     REACTION: unspecified  . Doxycycline   . Guaifenesin & Derivatives   . Penicillins     REACTION: unspecified    Current Outpatient Prescriptions  Medication Sig Dispense Refill  . albuterol (PROVENTIL) (2.5 MG/3ML) 0.083% nebulizer solution Take 2.5 mg by nebulization 4 (four) times daily. And every 4 hours as needed for wheezing      . allopurinol (ZYLOPRIM) 100 MG tablet Take 100 mg by mouth 2 (two) times daily.        Marland Kitchen aspirin 81 MG tablet Take 81 mg by mouth daily.        Marland Kitchen atenolol (TENORMIN) 50 MG tablet Take 50 mg by mouth daily.       Marland Kitchen aztreonam (AZACTAM) 1 G injection 1 g every 6 (six) hours.       . budesonide (PULMICORT) 0.25 MG/2ML nebulizer solution Take 0.25 mg by nebulization 4 (four) times daily.       . chlorpheniramine-HYDROcodone (TUSSIONEX) 10-8 MG/5ML LQCR Take 5 mLs by mouth at bedtime as needed.      . Cholecalciferol (VITAMIN D3) 50000 UNITS CAPS Take 1 tablet by mouth daily.      Marland Kitchen dextromethorphan (DELSYM) 30 MG/5ML liquid Take 60 mg by mouth every 12 (twelve) hours as needed.       Marland Kitchen epoetin alfa (EPOGEN,PROCRIT) 16109 UNIT/ML injection Inject  10,000 Units into the skin once a week.        . ferrous sulfate 325 (65 FE) MG tablet Take 325 mg by mouth 2 (two) times daily.        . insulin glargine (LANTUS) 100 UNIT/ML injection Inject 12 Units into the skin at bedtime.       . isosorbide mononitrate (IMDUR) 30 MG 24 hr tablet Take 15 mg by mouth daily.      Marland Kitchen lidocaine (LIDODERM) 5 % Place 1 patch onto the skin daily. Remove & Discard patch within 12 hours or as directed by MD      . [name] 1 G capsule Take 1 tablet by mouth daily.      Marland Kitchen moxifloxacin (AVELOX) 400 MG tablet Take 400 mg by mouth as needed.      Marland Kitchen omeprazole (PRILOSEC) 20 MG capsule Take 20 mg by mouth daily.        . phenazopyridine (PYRIDIUM) 100 MG tablet Take 100 mg by mouth every 6 (six) hours as needed (dysuria).      . polyethylene glycol (MIRALAX / GLYCOLAX) packet Take 17 g by mouth every 3 (three) days.      . Polyvinyl Alcohol (AKWA TEARS OP) Apply 2 drops to eye 4 (four) times daily.       Marland Kitchen  predniSONE (DELTASONE) 10 MG tablet Take 4 tablets daily for 5 days then stop  20 tablet  0  . predniSONE (DELTASONE) 5 MG tablet Take 5 mg by mouth daily with breakfast.      . Probiotic Product (PROBIOTIC DAILY PO) Take 1 capsule by mouth daily.      . rosuvastatin (CRESTOR) 20 MG tablet Take 20 mg by mouth daily.        . sennosides-docusate sodium (SENOKOT-S) 8.6-50 MG tablet Take 2 tablets by mouth at bedtime.       Marland Kitchen tiotropium (SPIRIVA) 18 MCG inhalation capsule Place 18 mcg into inhaler and inhale daily.        Marland Kitchen torsemide (DEMADEX) 20 MG tablet Take 40 mg by mouth daily.        No current facility-administered medications for this visit.    Past Medical History  Diagnosis Date  . COPD (chronic obstructive pulmonary disease)   . Diastolic congestive heart failure   . C. difficile colitis   . Hypertension   . Hyperlipidemia   . CAD (coronary artery disease)   . Anemia   . Type 2 diabetes mellitus   . Pacemaker 2002  . Cardiac arrest - asystole 2002  .  Gallstones   . Pulmonary hypertension   . Renal insufficiency   . Osteoarthritis   . Allergic rhinitis   . Gout   . Stasis dermatitis   . Sinoatrial node dysfunction   . Syncope   . Postsurgical percutaneous transluminal coronary angioplasty status   . Dyslipidemia   . Acute bronchitis   . History of cholelithiasis   . Obesity     Past Surgical History  Procedure Laterality Date  . Ptca  2002  . Appendectomy    . Vesicovaginal fistula closure w/ tah    . Pacemaker placement  2002    ROS: As stated in the HPI and negative for all other systems.  PHYSICAL EXAM BP 120/70  Pulse 77  Ht 4\' 11"  (1.499 m)  Wt 165 lb (74.844 kg)  BMI 33.31 kg/m2 PHYSICAL EXAM GEN:  No distress NECK:  No jugular venous distention at 45 degrees, waveform within normal limits, carotid upstroke brisk and symmetric, no bruits, no thyromegaly LYMPHATICS:  No cervical adenopathy LUNGS:  Decreased breath sounds without fine crackles, few scattered rhonchi BACK:  No CVA tenderness CHEST:  Well healed pacer pocket. HEART:  S1 and S2 within normal limits, no S3, no S4, no clicks, no rubs, no murmurs ABD:  Positive bowel sounds normal in frequency in pitch, no bruits, no rebound, no guarding. EXT:  2 plus pulses throughout, moderate to severe bilateral lower extremity edema less than previous, no cyanosis no clubbing, with chronic venous stasis, the left leg is more swollen than the right with some erythema.  There is some very mild erythema between the right great toe and right second toe.    EKG:  Sinus rhythm, rate 77, left bundle branch block, left axis deviation, no acute ST-T wave changes 08/06/2013  ASSESSMENT AND PLAN  DIASTOLIC HEART FAILURE, CHRONIC -  She seems to be euvolemic.  I will check a basic metabolic profile as she has been on a higher dose of diuretic.  HYPERTENSION - The blood pressure is at target. No change in medications is indicated. We will continue with therapeutic lifestyle  changes (TLC).   PACEMAKER - She is now up to date with pacemaker follow up.   CORONARY ARTERY DISEASE - Given the fact that  she's had some chest discomfort I would like to try to reschedule the stress test but I would prefer to have her lungs improved before doing this. I sent a note to Dr. Delford Field. I will plan on having her come back in one month and if she still describing the chest pain order a Lexiscan Myoview.

## 2013-08-23 ENCOUNTER — Non-Acute Institutional Stay: Payer: PRIVATE HEALTH INSURANCE | Admitting: Internal Medicine

## 2013-08-23 DIAGNOSIS — J449 Chronic obstructive pulmonary disease, unspecified: Secondary | ICD-10-CM

## 2013-08-23 DIAGNOSIS — E1129 Type 2 diabetes mellitus with other diabetic kidney complication: Secondary | ICD-10-CM

## 2013-08-23 DIAGNOSIS — E785 Hyperlipidemia, unspecified: Secondary | ICD-10-CM

## 2013-08-23 DIAGNOSIS — J4489 Other specified chronic obstructive pulmonary disease: Secondary | ICD-10-CM

## 2013-08-23 DIAGNOSIS — N058 Unspecified nephritic syndrome with other morphologic changes: Secondary | ICD-10-CM

## 2013-08-23 DIAGNOSIS — I2510 Atherosclerotic heart disease of native coronary artery without angina pectoris: Secondary | ICD-10-CM

## 2013-08-25 ENCOUNTER — Encounter: Payer: Self-pay | Admitting: Internal Medicine

## 2013-08-25 NOTE — Progress Notes (Signed)
PROGRESS NOTE  DATE: 08-23-13  FACILITY: Nursing Home Location: [facility]  LEVEL OF CARE: ALF (13)  Routine Visit  CHIEF COMPLAINT:  Manage COPD, diabetes mellitus and CAD  HISTORY OF PRESENT ILLNESS:  REASSESSMENT OF ONGOING PROBLEM(S):  CAD: The angina has been stable. The patient denies dyspnea on exertion, orthopnea,  palpitations and paroxysmal nocturnal dyspnea. Complains of chronic bilateral lower extremity swelling. No complications noted from the medication presently being used.  COPD: the COPD remains stable.  Pt denies sob, cough, wheezing or declining exercise tolerance.  No complications from the medications presently being used.  DM:pt's DM remains stable.  Pt denies polyuria, polydipsia, polyphagia, changes in vision or hypoglycemic episodes.  No complications noted from the medication presently being used.  Last hemoglobin A1c is: 5.5 in 4/14, in 10/14 hemoglobin A1c 5.7.  PAST MEDICAL HISTORY : Reviewed.  No changes.  CURRENT MEDICATIONS: Reviewed per [facility]  REVIEW OF SYSTEMS:  GENERAL: no change in appetite, no fatigue, no weight changes, no fever, chills or weakness RESPIRATORY: no cough, SOB, DOE, wheezing, hemoptysis CARDIAC: no chest pain, or palpitations, complains of chronic bilateral lower extremity swelling GI: no abdominal pain, diarrhea, constipation, heart burn, nausea or vomiting  PHYSICAL EXAMINATION  VS:  T 97.1   P 64   RR 18     BP 130/66    Wt 165  GENERAL: no acute distress, moderately obese body habitus EYES: conjunctivae normal, sclerae normal, normal eye lids NECK: supple, trachea midline, no neck masses, no thyroid tenderness, no thyromegaly LYMPHATICS: no LAN in the neck, no supraclavicular LAN RESPIRATORY: breathing is even & unlabored, BS CTAB CARDIAC: RRR, no murmur,no extra heart sounds, +3 left lower extremity edema and +2 right lower extremity edema GI: abdomen soft, normal BS, no masses, no tenderness,  no hepatomegaly, no splenomegaly PSYCHIATRIC: the patient is alert & oriented to person, affect & behavior appropriate  LABS/RADIOLOGY:  12-14 hemoglobin 10, MCV 87, platelets 143, WBC 8.5, glucose 100, BUN 43, creatinine 1.95 otherwise CMP normal, triglycerides 200, HDL 38, total cholesterol 161, LDL 59  11-14 hemoglobin 9, MCV 89 otherwise CBC normal  10- 14 glucose 125, BUN 38, creatinine 2.08, otherwise BMP normal, WBC 11.5, hemoglobin 10.3, MCV 93, platelets 156  9-14 WBC 11.8, hemoglobin 8.3, MCV 85, platelets 136, BUN 54, creatinine 1.82 otherwise BMP normal, total protein 5.6, albumin 3.4 otherwise liver profile normal, triglycerides 152, HDL 36, total potential 1:30, HDL 64, TSH 1.82  7/14 WBC 14.8, normal being 13.8, platelets 104, BUN 96, creatinine 2.2 otherwise CMP normal, repeat labs: WBC 18.4, hemoglobin 12.9, platelets 104, glucose 229, BUN 101, creatinine 2.38, potassium 5.9 otherwise BMP normal, liver profile is normal 6/14 triglycerides 20, HDL 35 otherwise fasting lipid panel normal  ASSESSMENT/PLAN:  COPD-well compensated. CAD-stable Diabetes mellitus with renal complications-well-controlled. Hyperlipidemia -well-controlled. Anemia of chronic kidney disease-stable. On iron. Chronic kidney disease stage IV-renal functions improved CHF-well compensated. GERD-stable. Osteoarthritis-pain well controlled. Constipation-well-controlled Hypokalemia -- well-controlled Gout-allopurinol was decreased  CPT CODE: 09604

## 2013-09-03 ENCOUNTER — Ambulatory Visit: Payer: Medicare Other | Admitting: Physician Assistant

## 2013-09-11 ENCOUNTER — Telehealth: Payer: Self-pay | Admitting: Critical Care Medicine

## 2013-09-11 NOTE — Telephone Encounter (Signed)
Spoke with pt. Offered appt this afternoon with MW in [place]. Pt does not have transportation and she is at [facility]. She c/o  Prod cough w/ yellow-white phlem, wheezing, chest tx, SOB w/ activity, chest congestion x Friday. No fever, no chills. No sweats. She has not taken anything OTC. Pt reports she is not sure when she could get someone to bring her in. Please advise Dr. Delford Field if a medication can be called in? Thanks  Allergies  Allergen Reactions  . Codeine     REACTION: unspecified  . Doxycycline   . Guaifenesin & Derivatives   . Penicillins     REACTION: unspecified

## 2013-09-11 NOTE — Telephone Encounter (Signed)
I spoke with Ms. Suits, pt friend and she states the pt is having a productive cough with yellow phlegm, and chest congestion. She could not tell me anymore symptoms. She advised I call the pt on cell. I did and had to leave message. Carron Curie, CMA

## 2013-09-11 NOTE — Telephone Encounter (Signed)
Left detailed message on machine with PW recs

## 2013-09-11 NOTE — Telephone Encounter (Signed)
No pred taper since got the solumedrol

## 2013-09-11 NOTE — Telephone Encounter (Signed)
lmomtcb x1 

## 2013-09-11 NOTE — Telephone Encounter (Signed)
Dorcas Suits called stating that we needed to call the patient directly on her cell phone to let her know what PW has recommended. Her cell number is [phone]

## 2013-09-11 NOTE — Telephone Encounter (Signed)
Error.[name] ° °

## 2013-09-11 NOTE — Telephone Encounter (Signed)
I called and spoke with [name]. She is aware I'm going to let Dr. Delford Field know about the visit she had with the NP. Dr. Delford Field do you still want [place] to call in teh RX for the pred taper since she is getting a solumedrol dose? thanks

## 2013-09-11 NOTE — Telephone Encounter (Signed)
Call in Avelox 400mg  daily for 7days Call in prednisone 10mg  Take 4 for three days 3 for three days 2 for three days 1 for three days and stop #30

## 2013-09-11 NOTE — Telephone Encounter (Signed)
I called # and had to [facility] I called # listed under pt name and it was [facility]. Spoke with pt nurse Fredderick Erb. She reports the NP saw pt and she rx'd pt avelox x 10 days and 1 dose of solumedrol 80 mg. I advised her will let Dr. Delford Field know pt was already seen by NP and aware of her plan. Nothing further needed

## 2013-09-14 ENCOUNTER — Ambulatory Visit: Payer: Medicare Other | Admitting: Cardiology

## 2013-09-14 ENCOUNTER — Emergency Department (HOSPITAL_COMMUNITY): Payer: Medicare Other

## 2013-09-14 ENCOUNTER — Encounter (HOSPITAL_COMMUNITY): Payer: Self-pay | Admitting: Emergency Medicine

## 2013-09-14 ENCOUNTER — Telehealth: Payer: Self-pay | Admitting: Critical Care Medicine

## 2013-09-14 ENCOUNTER — Inpatient Hospital Stay (HOSPITAL_COMMUNITY): Payer: Medicare Other

## 2013-09-14 ENCOUNTER — Inpatient Hospital Stay (HOSPITAL_COMMUNITY)
Admission: EM | Admit: 2013-09-14 | Discharge: 2013-09-19 | DRG: 208 | Disposition: A | Discharge status: Skilled Nursing Facility | Payer: Medicare Other | Attending: Pulmonary Disease | Admitting: Pulmonary Disease

## 2013-09-14 DIAGNOSIS — Z8719 Personal history of other diseases of the digestive system: Secondary | ICD-10-CM

## 2013-09-14 DIAGNOSIS — D638 Anemia in other chronic diseases classified elsewhere: Secondary | ICD-10-CM | POA: Diagnosis present

## 2013-09-14 DIAGNOSIS — Z79899 Other long term (current) drug therapy: Secondary | ICD-10-CM

## 2013-09-14 DIAGNOSIS — E860 Dehydration: Secondary | ICD-10-CM

## 2013-09-14 DIAGNOSIS — I5033 Acute on chronic diastolic (congestive) heart failure: Secondary | ICD-10-CM | POA: Diagnosis present

## 2013-09-14 DIAGNOSIS — J449 Chronic obstructive pulmonary disease, unspecified: Secondary | ICD-10-CM

## 2013-09-14 DIAGNOSIS — Y9210 Unspecified residential institution as the place of occurrence of the external cause: Secondary | ICD-10-CM | POA: Diagnosis present

## 2013-09-14 DIAGNOSIS — J9610 Chronic respiratory failure, unspecified whether with hypoxia or hypercapnia: Secondary | ICD-10-CM | POA: Diagnosis present

## 2013-09-14 DIAGNOSIS — J9600 Acute respiratory failure, unspecified whether with hypoxia or hypercapnia: Secondary | ICD-10-CM

## 2013-09-14 DIAGNOSIS — F0390 Unspecified dementia without behavioral disturbance: Secondary | ICD-10-CM | POA: Diagnosis present

## 2013-09-14 DIAGNOSIS — I129 Hypertensive chronic kidney disease with stage 1 through stage 4 chronic kidney disease, or unspecified chronic kidney disease: Secondary | ICD-10-CM | POA: Diagnosis present

## 2013-09-14 DIAGNOSIS — Z881 Allergy status to other antibiotic agents status: Secondary | ICD-10-CM

## 2013-09-14 DIAGNOSIS — I2510 Atherosclerotic heart disease of native coronary artery without angina pectoris: Secondary | ICD-10-CM

## 2013-09-14 DIAGNOSIS — R252 Cramp and spasm: Secondary | ICD-10-CM

## 2013-09-14 DIAGNOSIS — R141 Gas pain: Secondary | ICD-10-CM

## 2013-09-14 DIAGNOSIS — T380X5A Adverse effect of glucocorticoids and synthetic analogues, initial encounter: Secondary | ICD-10-CM | POA: Diagnosis present

## 2013-09-14 DIAGNOSIS — N179 Acute kidney failure, unspecified: Secondary | ICD-10-CM

## 2013-09-14 DIAGNOSIS — I2789 Other specified pulmonary heart diseases: Secondary | ICD-10-CM | POA: Diagnosis present

## 2013-09-14 DIAGNOSIS — E1129 Type 2 diabetes mellitus with other diabetic kidney complication: Secondary | ICD-10-CM | POA: Diagnosis present

## 2013-09-14 DIAGNOSIS — N184 Chronic kidney disease, stage 4 (severe): Secondary | ICD-10-CM | POA: Diagnosis present

## 2013-09-14 DIAGNOSIS — Z794 Long term (current) use of insulin: Secondary | ICD-10-CM

## 2013-09-14 DIAGNOSIS — Z8249 Family history of ischemic heart disease and other diseases of the circulatory system: Secondary | ICD-10-CM

## 2013-09-14 DIAGNOSIS — S0181XA Laceration without foreign body of other part of head, initial encounter: Secondary | ICD-10-CM

## 2013-09-14 DIAGNOSIS — S0180XA Unspecified open wound of other part of head, initial encounter: Secondary | ICD-10-CM | POA: Diagnosis present

## 2013-09-14 DIAGNOSIS — M109 Gout, unspecified: Secondary | ICD-10-CM

## 2013-09-14 DIAGNOSIS — J09X2 Influenza due to identified novel influenza A virus with other respiratory manifestations: Secondary | ICD-10-CM | POA: Diagnosis present

## 2013-09-14 DIAGNOSIS — E8729 Other acidosis: Secondary | ICD-10-CM

## 2013-09-14 DIAGNOSIS — E785 Hyperlipidemia, unspecified: Secondary | ICD-10-CM

## 2013-09-14 DIAGNOSIS — Z950 Presence of cardiac pacemaker: Secondary | ICD-10-CM

## 2013-09-14 DIAGNOSIS — M1990 Unspecified osteoarthritis, unspecified site: Secondary | ICD-10-CM

## 2013-09-14 DIAGNOSIS — W1809XA Striking against other object with subsequent fall, initial encounter: Secondary | ICD-10-CM

## 2013-09-14 DIAGNOSIS — J309 Allergic rhinitis, unspecified: Secondary | ICD-10-CM

## 2013-09-14 DIAGNOSIS — I509 Heart failure, unspecified: Secondary | ICD-10-CM | POA: Diagnosis present

## 2013-09-14 DIAGNOSIS — R609 Edema, unspecified: Secondary | ICD-10-CM

## 2013-09-14 DIAGNOSIS — I495 Sick sinus syndrome: Secondary | ICD-10-CM

## 2013-09-14 DIAGNOSIS — Z880 Allergy status to penicillin: Secondary | ICD-10-CM

## 2013-09-14 DIAGNOSIS — Z6831 Body mass index (BMI) 31.0-31.9, adult: Secondary | ICD-10-CM

## 2013-09-14 DIAGNOSIS — J4489 Other specified chronic obstructive pulmonary disease: Secondary | ICD-10-CM

## 2013-09-14 DIAGNOSIS — Z888 Allergy status to other drugs, medicaments and biological substances status: Secondary | ICD-10-CM

## 2013-09-14 DIAGNOSIS — R300 Dysuria: Secondary | ICD-10-CM

## 2013-09-14 DIAGNOSIS — R143 Flatulence: Secondary | ICD-10-CM

## 2013-09-14 DIAGNOSIS — E872 Acidosis, unspecified: Secondary | ICD-10-CM | POA: Diagnosis present

## 2013-09-14 DIAGNOSIS — Z780 Asymptomatic menopausal state: Secondary | ICD-10-CM

## 2013-09-14 DIAGNOSIS — J101 Influenza due to other identified influenza virus with other respiratory manifestations: Secondary | ICD-10-CM | POA: Diagnosis present

## 2013-09-14 DIAGNOSIS — I5030 Unspecified diastolic (congestive) heart failure: Secondary | ICD-10-CM | POA: Diagnosis present

## 2013-09-14 DIAGNOSIS — Z87891 Personal history of nicotine dependence: Secondary | ICD-10-CM

## 2013-09-14 DIAGNOSIS — I10 Essential (primary) hypertension: Secondary | ICD-10-CM

## 2013-09-14 DIAGNOSIS — Z8674 Personal history of sudden cardiac arrest: Secondary | ICD-10-CM

## 2013-09-14 DIAGNOSIS — I872 Venous insufficiency (chronic) (peripheral): Secondary | ICD-10-CM | POA: Diagnosis present

## 2013-09-14 DIAGNOSIS — R7989 Other specified abnormal findings of blood chemistry: Secondary | ICD-10-CM | POA: Diagnosis present

## 2013-09-14 DIAGNOSIS — J9620 Acute and chronic respiratory failure, unspecified whether with hypoxia or hypercapnia: Secondary | ICD-10-CM | POA: Diagnosis present

## 2013-09-14 DIAGNOSIS — Z9861 Coronary angioplasty status: Secondary | ICD-10-CM

## 2013-09-14 DIAGNOSIS — R0689 Other abnormalities of breathing: Secondary | ICD-10-CM | POA: Diagnosis present

## 2013-09-14 DIAGNOSIS — J441 Chronic obstructive pulmonary disease with (acute) exacerbation: Principal | ICD-10-CM | POA: Diagnosis present

## 2013-09-14 DIAGNOSIS — D649 Anemia, unspecified: Secondary | ICD-10-CM

## 2013-09-14 DIAGNOSIS — N058 Unspecified nephritic syndrome with other morphologic changes: Secondary | ICD-10-CM

## 2013-09-14 DIAGNOSIS — R142 Eructation: Secondary | ICD-10-CM

## 2013-09-14 DIAGNOSIS — D7289 Other specified disorders of white blood cells: Secondary | ICD-10-CM

## 2013-09-14 DIAGNOSIS — G9341 Metabolic encephalopathy: Secondary | ICD-10-CM | POA: Diagnosis present

## 2013-09-14 DIAGNOSIS — E669 Obesity, unspecified: Secondary | ICD-10-CM

## 2013-09-14 DIAGNOSIS — Z885 Allergy status to narcotic agent status: Secondary | ICD-10-CM

## 2013-09-14 DIAGNOSIS — Z833 Family history of diabetes mellitus: Secondary | ICD-10-CM

## 2013-09-14 LAB — BLOOD GAS, ARTERIAL
Acid-Base Excess: 0.5 mmol/L (ref 0.0–2.0)
Acid-base deficit: 1.6 mmol/L (ref 0.0–2.0)
BICARBONATE: 28.8 meq/L — AB (ref 20.0–24.0)
Bicarbonate: 28.7 mEq/L — ABNORMAL HIGH (ref 20.0–24.0)
DRAWN BY: 257701
Drawn by: 317871
O2 Content: 3 L/min
O2 Content: 2 L/min
O2 Saturation: 97.6 %
O2 Saturation: 93.5 %
PATIENT TEMPERATURE: 98.6
PCO2 ART: 88.7 mmHg — AB (ref 35.0–45.0)
PH ART: 7.232 — AB (ref 7.350–7.450)
PH ART: 7.137 — AB (ref 7.350–7.450)
Patient temperature: 98.6
TCO2: 27.6 mmol/L (ref 0–100)
TCO2: 28.4 mmol/L (ref 0–100)
pCO2 arterial: 70.7 mmHg (ref 35.0–45.0)
pO2, Arterial: 112 mmHg — ABNORMAL HIGH (ref 80.0–100.0)
pO2, Arterial: 79.3 mmHg — ABNORMAL LOW (ref 80.0–100.0)

## 2013-09-14 LAB — MRSA PCR SCREENING: MRSA BY PCR: NEGATIVE

## 2013-09-14 LAB — BASIC METABOLIC PANEL
BUN: 84 mg/dL — ABNORMAL HIGH (ref 6–23)
CALCIUM: 8.8 mg/dL (ref 8.4–10.5)
CO2: 29 meq/L (ref 19–32)
Chloride: 92 mEq/L — ABNORMAL LOW (ref 96–112)
Creatinine, Ser: 3.84 mg/dL — ABNORMAL HIGH (ref 0.50–1.10)
GFR calc non Af Amer: 10 mL/min — ABNORMAL LOW (ref >90)
GFR, EST AFRICAN AMERICAN: 12 mL/min — AB (ref >90)
Glucose, Bld: 144 mg/dL — ABNORMAL HIGH (ref 70–99)
Potassium: 4.2 mEq/L (ref 3.7–5.3)
SODIUM: 135 meq/L — AB (ref 137–147)

## 2013-09-14 LAB — CBC
HCT: 32.7 % — ABNORMAL LOW (ref 36.0–46.0)
HEMOGLOBIN: 10.2 g/dL — AB (ref 12.0–15.0)
MCH: 26.8 pg (ref 26.0–34.0)
MCHC: 31.2 g/dL (ref 30.0–36.0)
MCV: 86.1 fL (ref 78.0–100.0)
PLATELETS: 161 10*3/uL (ref 150–400)
RBC: 3.8 MIL/uL — AB (ref 3.87–5.11)
RDW: 15.1 % (ref 11.5–15.5)
WBC: 9 10*3/uL (ref 4.0–10.5)

## 2013-09-14 LAB — COMPREHENSIVE METABOLIC PANEL
ALBUMIN: 3.2 g/dL — AB (ref 3.5–5.2)
ALT: 25 U/L (ref 0–35)
AST: 57 U/L — AB (ref 0–37)
Alkaline Phosphatase: 62 U/L (ref 39–117)
BUN: 81 mg/dL — ABNORMAL HIGH (ref 6–23)
CALCIUM: 9.1 mg/dL (ref 8.4–10.5)
CO2: 27 mEq/L (ref 19–32)
CREATININE: 3.75 mg/dL — AB (ref 0.50–1.10)
Chloride: 90 mEq/L — ABNORMAL LOW (ref 96–112)
GFR calc Af Amer: 12 mL/min — ABNORMAL LOW (ref >90)
GFR, EST NON AFRICAN AMERICAN: 10 mL/min — AB (ref >90)
Glucose, Bld: 141 mg/dL — ABNORMAL HIGH (ref 70–99)
Potassium: 4 mEq/L (ref 3.7–5.3)
Sodium: 135 mEq/L — ABNORMAL LOW (ref 137–147)
TOTAL PROTEIN: 7.5 g/dL (ref 6.0–8.3)
Total Bilirubin: 0.3 mg/dL (ref 0.3–1.2)

## 2013-09-14 LAB — GLUCOSE, CAPILLARY: Glucose-Capillary: 154 mg/dL — ABNORMAL HIGH (ref 70–99)

## 2013-09-14 LAB — POCT I-STAT TROPONIN I: TROPONIN I, POC: 0.03 ng/mL (ref 0.00–0.08)

## 2013-09-14 LAB — MAGNESIUM: MAGNESIUM: 1.9 mg/dL (ref 1.5–2.5)

## 2013-09-14 LAB — SAMPLE TO BLOOD BANK

## 2013-09-14 LAB — PROTIME-INR
INR: 1.22 (ref 0.00–1.49)
PROTHROMBIN TIME: 15.1 s (ref 11.6–15.2)

## 2013-09-14 LAB — PRO B NATRIURETIC PEPTIDE: PRO B NATRI PEPTIDE: 8732 pg/mL — AB (ref 0–450)

## 2013-09-14 MED ORDER — SUCCINYLCHOLINE CHLORIDE 20 MG/ML IJ SOLN
INTRAMUSCULAR | Status: AC
Start: 2013-09-14 — End: 2013-09-15
  Filled 2013-09-14: qty 1

## 2013-09-14 MED ORDER — ONDANSETRON HCL 4 MG PO TABS
4.0000 mg | ORAL_TABLET | Freq: Four times a day (QID) | ORAL | Status: DC | PRN
Start: 2013-09-14 — End: 2013-09-15

## 2013-09-14 MED ORDER — SODIUM CHLORIDE 0.9 % IV BOLUS (SEPSIS)
500.0000 mL | Freq: Once | INTRAVENOUS | Status: AC
  Administered 2013-09-14: 500 mL via INTRAVENOUS

## 2013-09-14 MED ORDER — TETANUS-DIPHTH-ACELL PERTUSSIS 5-2.5-18.5 LF-MCG/0.5 IM SUSP
0.5000 mL | Freq: Once | INTRAMUSCULAR | Status: AC
  Administered 2013-09-14: 0.5 mL via INTRAMUSCULAR
  Filled 2013-09-14: qty 0.5

## 2013-09-14 MED ORDER — SODIUM CHLORIDE 0.9 % IJ SOLN
3.0000 mL | Freq: Two times a day (BID) | INTRAMUSCULAR | Status: DC
  Administered 2013-09-14: 3 mL via INTRAVENOUS

## 2013-09-14 MED ORDER — MORPHINE SULFATE 2 MG/ML IJ SOLN: 1.0000 mg | INTRAMUSCULAR | Status: DC | PRN

## 2013-09-14 MED ORDER — ALBUTEROL SULFATE (2.5 MG/3ML) 0.083% IN NEBU
2.5000 mg | INHALATION_SOLUTION | Freq: Once | RESPIRATORY_TRACT | Status: AC
  Administered 2013-09-14: 2.5 mg via RESPIRATORY_TRACT
  Filled 2013-09-14: qty 3

## 2013-09-14 MED ORDER — IPRATROPIUM BROMIDE 0.02 % IN SOLN
0.5000 mg | Freq: Once | RESPIRATORY_TRACT | Status: AC
  Administered 2013-09-14: 0.5 mg via RESPIRATORY_TRACT
  Filled 2013-09-14: qty 2.5

## 2013-09-14 MED ORDER — ETOMIDATE 2 MG/ML IV SOLN
INTRAVENOUS | Status: AC
Start: 2013-09-14 — End: 2013-09-15
  Filled 2013-09-14: qty 20

## 2013-09-14 MED ORDER — FENTANYL CITRATE 0.05 MG/ML IJ SOLN
INTRAMUSCULAR | Status: AC
  Administered 2013-09-14: 100 ug
  Filled 2013-09-14: qty 2

## 2013-09-14 MED ORDER — ONDANSETRON HCL 4 MG/2ML IJ SOLN: 4.0000 mg | Freq: Four times a day (QID) | INTRAMUSCULAR | Status: DC | PRN

## 2013-09-14 MED ORDER — LORAZEPAM 2 MG/ML IJ SOLN
0.5000 mg | Freq: Once | INTRAMUSCULAR | Status: AC
Start: 2013-09-14 — End: 2013-09-14
  Administered 2013-09-14: 0.5 mg via INTRAVENOUS
  Filled 2013-09-14: qty 1

## 2013-09-14 MED ORDER — SODIUM CHLORIDE 0.9 % IJ SOLN: 3.0000 mL | INTRAMUSCULAR | Status: DC | PRN

## 2013-09-14 MED ORDER — SODIUM CHLORIDE 0.9 % IJ SOLN: 3.0000 mL | Freq: Two times a day (BID) | INTRAMUSCULAR | Status: DC

## 2013-09-14 MED ORDER — LIDOCAINE HCL 1 % IJ SOLN
30.0000 mL | Freq: Once | INTRAMUSCULAR | Status: AC
  Administered 2013-09-14: 30 mL via INTRADERMAL
  Filled 2013-09-14: qty 40

## 2013-09-14 MED ORDER — PROPOFOL 10 MG/ML IV EMUL
INTRAVENOUS | Status: AC
  Administered 2013-09-14: 1000 mg
  Filled 2013-09-14: qty 100

## 2013-09-14 MED ORDER — FUROSEMIDE 10 MG/ML IJ SOLN
20.0000 mg | Freq: Two times a day (BID) | INTRAMUSCULAR | Status: DC
  Administered 2013-09-14: 20 mg via INTRAVENOUS
  Filled 2013-09-14: qty 2

## 2013-09-14 MED ORDER — ROCURONIUM BROMIDE 50 MG/5ML IV SOLN
INTRAVENOUS | Status: AC
  Administered 2013-09-14: 40 mg
  Filled 2013-09-14: qty 2

## 2013-09-14 MED ORDER — METHYLPREDNISOLONE SODIUM SUCC 125 MG IJ SOLR
125.0000 mg | Freq: Once | INTRAMUSCULAR | Status: AC
  Administered 2013-09-14: 125 mg via INTRAVENOUS
  Filled 2013-09-14: qty 2

## 2013-09-14 MED ORDER — INSULIN ASPART 100 UNIT/ML ~~LOC~~ SOLN
0.0000 [IU] | SUBCUTANEOUS | Status: DC
  Administered 2013-09-14: 2 [IU] via SUBCUTANEOUS
  Administered 2013-09-15 (×3): 1 [IU] via SUBCUTANEOUS
  Administered 2013-09-15: 2 [IU] via SUBCUTANEOUS
  Administered 2013-09-15: 1 [IU] via SUBCUTANEOUS
  Administered 2013-09-16 (×3): 2 [IU] via SUBCUTANEOUS

## 2013-09-14 MED ORDER — LIDOCAINE HCL (CARDIAC) 20 MG/ML IV SOLN
INTRAVENOUS | Status: AC
  Filled 2013-09-14: qty 5

## 2013-09-14 MED ORDER — SODIUM CHLORIDE 0.9 % IV SOLN: 250.0000 mL | INTRAVENOUS | Status: DC | PRN

## 2013-09-14 MED ORDER — ALBUTEROL SULFATE (2.5 MG/3ML) 0.083% IN NEBU
2.5000 mg | INHALATION_SOLUTION | Freq: Four times a day (QID) | RESPIRATORY_TRACT | Status: DC
  Administered 2013-09-14: 2.5 mg via RESPIRATORY_TRACT
  Filled 2013-09-14: qty 3

## 2013-09-14 NOTE — Telephone Encounter (Signed)
I lmomtcb to inform will send to PW as FYI. Will route to PW as FYI -- pt in ED.

## 2013-09-14 NOTE — ED Provider Notes (Signed)
CSN: [id number]     Arrival date & time 09/14/13  1438 History   First MD Initiated Contact with Patient 09/14/13 1500     Chief Complaint  Patient presents with  . Fall  . Head Laceration    HPI Pt presents to the ED after a fall this morning at her nursing facility.  Pt states she was getting up and felt weak causing her to fall.  Pt struck her head sustaining a laceration over her right eyebrow.  She denies any loc.    She denies any other pain or injuries after the fall.  Pt states she has had a cough the last few days.  Her weakness has progressed and was more severe today. She has not had any vomiting or diarrhea.  Denies chest pain.  She has been coughing a lot. Per report, she has been on tamiflu and avelox for the last three days and has been getting treatment for a COPD exacerbation.  Past Medical History  Diagnosis Date  . COPD (chronic obstructive pulmonary disease)   . Diastolic congestive heart failure   . C. difficile colitis   . Hypertension   . Hyperlipidemia   . CAD (coronary artery disease)   . Anemia   . Type 2 diabetes mellitus   . Pacemaker 2002  . Cardiac arrest - asystole 2002  . Gallstones   . Pulmonary hypertension   . Renal insufficiency   . Osteoarthritis   . Allergic rhinitis   . Gout   . Stasis dermatitis   . Sinoatrial node dysfunction   . Syncope   . Postsurgical percutaneous transluminal coronary angioplasty status   . Dyslipidemia   . Acute bronchitis   . History of cholelithiasis   . Obesity    Past Surgical History  Procedure Laterality Date  . Ptca  2002  . Appendectomy    . Vesicovaginal fistula closure w/ tah    . Pacemaker placement  2002   Family History  Problem Relation Age of Onset  . Diabetes Mother   . Hypertension Mother   . Heart disease Father    History  Substance Use Topics  . Smoking status: Former Smoker -- 2.00 packs/day for 25 years    Types: Cigarettes    Quit date: 09/13/1985  . Smokeless tobacco: Never  Used  . Alcohol Use: No   OB History   Grav Para Term Preterm Abortions TAB SAB Ect Mult Living                 Review of Systems  All other systems reviewed and are negative.    Allergies  Codeine; Doxycycline; Guaifenesin & derivatives; and Penicillins  Home Medications   Current Outpatient Rx  Name  Route  Sig  Dispense  Refill  . albuterol (PROVENTIL) (2.5 MG/3ML) 0.083% nebulizer solution   Nebulization   Take 2.5 mg by nebulization 4 (four) times daily. And every 4 hours as needed for wheezing         . allopurinol (ZYLOPRIM) 100 MG tablet   Oral   Take 100 mg by mouth 2 (two) times daily.           Marland Kitchen aspirin 81 MG tablet   Oral   Take 81 mg by mouth daily.           Marland Kitchen atenolol (TENORMIN) 50 MG tablet   Oral   Take 50 mg by mouth daily.          Marland Kitchen aztreonam (  AZACTAM) 1 G injection      1 g every 6 (six) hours.          . budesonide (PULMICORT) 0.25 MG/2ML nebulizer solution   Nebulization   Take 0.25 mg by nebulization 4 (four) times daily.          . chlorpheniramine-HYDROcodone (TUSSIONEX) 10-8 MG/5ML LQCR   Oral   Take 5 mLs by mouth at bedtime as needed.         . Cholecalciferol (VITAMIN D3) 50000 UNITS CAPS   Oral   Take 1 tablet by mouth daily.         Marland Kitchen dextromethorphan (DELSYM) 30 MG/5ML liquid   Oral   Take 60 mg by mouth every 12 (twelve) hours as needed.          Marland Kitchen epoetin alfa (EPOGEN,PROCRIT) 16109 UNIT/ML injection   Subcutaneous   Inject 10,000 Units into the skin once a week.           . ferrous sulfate 325 (65 FE) MG tablet   Oral   Take 325 mg by mouth 2 (two) times daily.           . insulin glargine (LANTUS) 100 UNIT/ML injection   Subcutaneous   Inject 12 Units into the skin at bedtime.          . isosorbide mononitrate (IMDUR) 30 MG 24 hr tablet   Oral   Take 15 mg by mouth daily.         Marland Kitchen lidocaine (LIDODERM) 5 %   Transdermal   Place 1 patch onto the skin daily. Remove & Discard patch  within 12 hours or as directed by MD         . [name] 1 G capsule   Oral   Take 1 tablet by mouth daily.         Marland Kitchen moxifloxacin (AVELOX) 400 MG tablet   Oral   Take 400 mg by mouth as needed.         Marland Kitchen omeprazole (PRILOSEC) 20 MG capsule   Oral   Take 20 mg by mouth daily.           Marland Kitchen oseltamivir (TAMIFLU) 75 MG capsule   Oral   Take 75 mg by mouth daily.         . phenazopyridine (PYRIDIUM) 100 MG tablet   Oral   Take 100 mg by mouth every 6 (six) hours as needed (dysuria).         . polyethylene glycol (MIRALAX / GLYCOLAX) packet   Oral   Take 17 g by mouth every 3 (three) days.         . predniSONE (DELTASONE) 5 MG tablet   Oral   Take 5 mg by mouth daily with breakfast.         . Probiotic Product (PROBIOTIC DAILY PO)   Oral   Take 1 capsule by mouth daily.         . rosuvastatin (CRESTOR) 20 MG tablet   Oral   Take 20 mg by mouth daily.           Marland Kitchen senna (SENOKOT) 8.6 MG tablet   Oral   Take 1 tablet by mouth daily.         Marland Kitchen tiotropium (SPIRIVA) 18 MCG inhalation capsule   Inhalation   Place 18 mcg into inhaler and inhale daily.           Marland Kitchen torsemide (DEMADEX) 20 MG tablet  Oral   Take 40 mg by mouth daily.           BP 139/56  Pulse 63  Temp(Src) 99.5 F (37.5 C) (Oral)  Resp 20  SpO2 98% Physical Exam  Nursing note and vitals reviewed. Constitutional: No distress.  HENT:  Head: Normocephalic.  Right Ear: External ear normal.  Left Ear: External ear normal.  Mouth/Throat: No oropharyngeal exudate.  Laceration right forehead, no active bleeding   Eyes: Conjunctivae and EOM are normal. Pupils are equal, round, and reactive to light. Right eye exhibits no discharge. Left eye exhibits no discharge. No scleral icterus.  Neck: Neck supple. No tracheal deviation present.  Cardiovascular: Normal rate, regular rhythm and intact distal pulses.   Pulmonary/Chest: Effort normal. No accessory muscle usage or stridor. Not  tachypneic. No respiratory distress. She has decreased breath sounds. She has no wheezes. She has rhonchi. She has no rales.  ronchi with few rales diffusely  Abdominal: Soft. Bowel sounds are normal. She exhibits no distension. There is no tenderness. There is no rebound and no guarding.  Musculoskeletal: She exhibits no edema and no tenderness.  Neurological: She is alert. She has normal strength. No sensory deficit. Cranial nerve deficit:  no gross defecits noted. She exhibits normal muscle tone. She displays no seizure activity. Coordination normal.  Skin: Skin is warm and dry. No rash noted. She is not diaphoretic.  Psychiatric: She has a normal mood and affect.    ED Course  Procedures (including critical care time) 1611  ABG reviewed.  Elevated PCO2, resp acidosis.  Will start bipap. 1741  Pt did not tolerate the bipap.  She is alert and oriented but refuses to keep it on CRITICAL CARE Performed by: Wallace Gappa R Total critical care time:35 Critical care time was exclusive of separately billable procedures and treating other patients. Critical care was necessary to treat or prevent imminent or life-threatening deterioration. Critical care was time spent personally by me on the following activities: development of treatment plan with patient and/or surrogate as well as nursing, discussions with consultants, evaluation of patient's response to treatment, examination of patient, obtaining history from patient or surrogate, ordering and performing treatments and interventions, ordering and review of laboratory studies, ordering and review of radiographic studies, pulse oximetry and re-evaluation of patient's condition.   Labs Review Labs Reviewed  CBC - Abnormal; Notable for the following:    RBC 3.80 (*)    Hemoglobin 10.2 (*)    HCT 32.7 (*)    All other components within normal limits  BLOOD GAS, ARTERIAL - Abnormal; Notable for the following:    pH, Arterial 7.232 (*)    pCO2  arterial 70.7 (*)    pO2, Arterial 112.0 (*)    Bicarbonate 28.7 (*)    All other components within normal limits  COMPREHENSIVE METABOLIC PANEL - Abnormal; Notable for the following:    Sodium 135 (*)    Chloride 90 (*)    Glucose, Bld 141 (*)    BUN 81 (*)    Creatinine, Ser 3.75 (*)    Albumin 3.2 (*)    AST 57 (*)    GFR calc non Af Amer 10 (*)    GFR calc Af Amer 12 (*)    All other components within normal limits  PROTIME-INR  PRO B NATRIURETIC PEPTIDE  POCT I-STAT TROPONIN I  SAMPLE TO BLOOD BANK   Imaging Review Dg Chest 2 View  09/14/2013   CLINICAL DATA:  Fall.  Shortness  breath.  EXAM: CHEST  2 VIEW  COMPARISON:  10/03/2012 CT.  02/11/2012 chest x-ray.  FINDINGS: Frontal view limited by overlying soft tissue.  Sequential pacemaker in place with leads unchanged in position. Cardiomegaly.  Calcified tortuous aorta.  Pulmonary vascular prominence most notable centrally.  No segmental consolidation or gross pneumothorax.  The CT described tiny pulmonary nodules are not adequately assessed by plain film exam. Please see prior CT report impression 3.  IMPRESSION: Frontal view limited by overlying soft tissue.  Sequential pacemaker in place with leads unchanged in position. Cardiomegaly.  Calcified tortuous aorta.  Pulmonary vascular prominence most notable centrally.  No segmental consolidation or gross pneumothorax.  The CT described tiny pulmonary nodules are not adequately assessed by plain film exam. Please see prior CT report impression 3.   Electronically Signed   By: [name] LarssonSteve  Olson M.D.   On: 09/14/2013 16:38   Ct Head Wo Contrast  09/14/2013   CLINICAL DATA:  Fall.  Head laceration.  EXAM: CT HEAD WITHOUT CONTRAST  CT CERVICAL SPINE WITHOUT CONTRAST  TECHNIQUE: Multidetector CT imaging of the head and cervical spine was performed following the standard protocol without intravenous contrast. Multiplanar CT image reconstructions of the cervical spine were also generated.  COMPARISON:   01/20/2005 .  FINDINGS: CT HEAD FINDINGS  Images were obtained and reconstructed to approximate a normal head CT angulation. The patient's face inch scan are lower de against the chest, with the neck flexed.  Stable appearance of remote right frontal lobe infarct. Otherwise, the brainstem, cerebellum, cerebral peduncles, thalamus, basal ganglia, basilar cisterns, and ventricular system appear within normal limits. No intracranial hemorrhage, mass lesion, or acute CVA.  There is opacification of multiple ethmoid air cells as well as potential air-fluid level in the left sphenoid sinus. Despite efforts by the technologist and patient, motion artifact is present on today's exam and could not be eliminated. This reduces exam sensitivity and specificity.  CT CERVICAL SPINE FINDINGS  The cervical spine is prominently flexed during imaging. There is motion artifact which reduces diagnostic sensitivity and specificity.  Prominent multilevel facet arthropathy noted. Suspected partial facet fusion on the right at C2-3.  There is 2.5 mm of anterior subluxation at the C3-4 level associated with prominent degenerative facet arthropathy. I do not observe a fracture.  There is considerable loss of intervertebral disc height at C5-6 and C6-7, and at C3-4. Posterior osseous ridging noted at C5-6  IMPRESSION: 1. Remote right frontal lobe infarct. No acute intracranial findings observed. 2. Acute on chronic paranasal sinusitis. 3. 2.5 mm of anterior subluxation at C3-4 is probably degenerative. No fracture observed. 4. Cervical spondylosis and degenerative disc disease. 5. Despite efforts by the technologist and patient, motion artifact is present on today's exam and could not be eliminated. This reduces exam sensitivity and specificity. 6. The neck was markedly flexed during imaging. We performed multiplanar reformatting in order to approximate normal scan planes and facilitate comparison and pattern recognition.   Electronically  Signed   By: Herbie BaltimoreWalt  Liebkemann M.D.   On: 09/14/2013 16:52   Ct Cervical Spine Wo Contrast  09/14/2013   CLINICAL DATA:  Fall.  Head laceration.  EXAM: CT HEAD WITHOUT CONTRAST  CT CERVICAL SPINE WITHOUT CONTRAST  TECHNIQUE: Multidetector CT imaging of the head and cervical spine was performed following the standard protocol without intravenous contrast. Multiplanar CT image reconstructions of the cervical spine were also generated.  COMPARISON:  01/20/2005 .  FINDINGS: CT HEAD FINDINGS  Images were obtained and  reconstructed to approximate a normal head CT angulation. The patient's face inch scan are lower de against the chest, with the neck flexed.  Stable appearance of remote right frontal lobe infarct. Otherwise, the brainstem, cerebellum, cerebral peduncles, thalamus, basal ganglia, basilar cisterns, and ventricular system appear within normal limits. No intracranial hemorrhage, mass lesion, or acute CVA.  There is opacification of multiple ethmoid air cells as well as potential air-fluid level in the left sphenoid sinus. Despite efforts by the technologist and patient, motion artifact is present on today's exam and could not be eliminated. This reduces exam sensitivity and specificity.  CT CERVICAL SPINE FINDINGS  The cervical spine is prominently flexed during imaging. There is motion artifact which reduces diagnostic sensitivity and specificity.  Prominent multilevel facet arthropathy noted. Suspected partial facet fusion on the right at C2-3.  There is 2.5 mm of anterior subluxation at the C3-4 level associated with prominent degenerative facet arthropathy. I do not observe a fracture.  There is considerable loss of intervertebral disc height at C5-6 and C6-7, and at C3-4. Posterior osseous ridging noted at C5-6  IMPRESSION: 1. Remote right frontal lobe infarct. No acute intracranial findings observed. 2. Acute on chronic paranasal sinusitis. 3. 2.5 mm of anterior subluxation at C3-4 is probably  degenerative. No fracture observed. 4. Cervical spondylosis and degenerative disc disease. 5. Despite efforts by the technologist and patient, motion artifact is present on today's exam and could not be eliminated. This reduces exam sensitivity and specificity. 6. The neck was markedly flexed during imaging. We performed multiplanar reformatting in order to approximate normal scan planes and facilitate comparison and pattern recognition.   Electronically Signed   By: Herbie Baltimore M.D.   On: 09/14/2013 16:52    EKG Interpretation    Date/Time:  Friday September 14 2013 15:26:43 EST Ventricular Rate:  69 PR Interval:  159 QRS Duration: 108 QT Interval:  470 QTC Calculation: 504 R Axis:   110 Text Interpretation:  Right and left arm electrode reversal, new since last tracing Sinus rhythm Probable lateral infarct, age indeterminate Prolonged QT interval Artifact Confirmed by Skylur Fuston  MD-J, Leisel Pinette ([id number]) on 09/14/2013 3:31:01 PM           Medications  methylPREDNISolone sodium succinate (SOLU-MEDROL) 125 mg/2 mL injection 125 mg (not administered)  sodium chloride 0.9 % bolus 500 mL (not administered)  albuterol (PROVENTIL) (2.5 MG/3ML) 0.083% nebulizer solution 2.5 mg (2.5 mg Nebulization Given 09/14/13 1527)  ipratropium (ATROVENT) nebulizer solution 0.5 mg (0.5 mg Nebulization Given 09/14/13 1527)  Tdap (BOOSTRIX) injection 0.5 mL (0.5 mLs Intramuscular Given 09/14/13 1538)  lidocaine (XYLOCAINE) 1 % (with pres) injection 30 mL (30 mLs Intradermal Given by Other 09/14/13 1543)    MDM   1. COPD exacerbation   2. Respiratory acidosis   3. Acute kidney injury   4. Dehydration   5. Fall against object, initial encounter   6. Facial laceration, initial encounter      Patient had an episode of weakness resulting in a fall. Suspect her weakness and illnesses is related to his COPD exacerbation. The patient's laboratory tests do show a component of respiratory acidosis. We attempted BiPAP however  the patient is refusing right now. I did explain to her this continues to worsen she could ultimately require intubation. Patient was aware of this complication. Currently, she is not in any significant distress. Tonsillectomy medical service regarding admission for treatment of her COPD exacerbation.  BUN and Cr are worsened suggesting a component of acute  kidney injury.  THe laceration repair was perfomed by PA Palmer Under my supervision.   Celene Kras, MD 09/14/13 [phone]

## 2013-09-14 NOTE — H&P (Signed)
[facility] History and Physical  [name] ZOX:[id number] DOB: May 22, 1928 DOA: 09/14/2013  Referring physician: Iantha Fallen, ER physician PCP: Karlene Einstein, MD   Chief Complaint: Fall  HPI: [name] is a 80 y.o. female  Past medical history advanced COPD, chronic renal disease and diastolic heart failure brought in from her assisted living facility after a fall that led to significant contusions on the right side of her face. No evidence of fracture, but given his COPD she was evaluated with blood gas and labs. Blood gas was concerning for a pH of 7.23 with a PCO2 of 70. Her bicarbonate level actually was relatively normal. The rest of her blood work noted a minimal metabolic acidosis as well as an elevated creatinine of 3.75, when 6 weeks ago, it was 2.3. Patient was started on BiPAP, but refused to wear it. ER physician was able to convince her to wear it for only a brief amount of time and then she took it off. Hospitalists were called for further evaluation and admission. Patient was placed in the step down unit   Review of Systems:  Patient seen after arrival to step down unit. She appears to be more lethargic and confused than what was reported in the emergency room. She complains of feeling rough and slightly short of breath. She does remember the accident with the floor hit her. She denies any headaches or vision changes. Denies any chest pain or palpitations. Denies any abdominal pain. She does not give me much more review of systems at.  Past Medical History  Diagnosis Date  . COPD (chronic obstructive pulmonary disease)   . Diastolic congestive heart failure   . C. difficile colitis   . Hypertension   . Hyperlipidemia   . CAD (coronary artery disease)   . Anemia   . Type 2 diabetes mellitus   . Pacemaker 2002  . Cardiac arrest - asystole 2002  . Gallstones   . Pulmonary hypertension   . Renal insufficiency   . Osteoarthritis   . Allergic rhinitis   .  Gout   . Stasis dermatitis   . Sinoatrial node dysfunction   . Syncope   . Postsurgical percutaneous transluminal coronary angioplasty status   . Dyslipidemia   . Acute bronchitis   . History of cholelithiasis   . Obesity    Past Surgical History  Procedure Laterality Date  . Ptca  2002  . Appendectomy    . Vesicovaginal fistula closure w/ tah    . Pacemaker placement  2002   Social History:  reports that she quit smoking about 28 years ago. Her smoking use included Cigarettes. She has a 50 pack-year smoking history. She has never used smokeless tobacco. She reports that she does not drink alcohol. Her drug history is not on file.  patient from an assisted-living facility. Unclear as to her baseline ambulatory status.  Allergies  Allergen Reactions  . Codeine     REACTION: unspecified  . Doxycycline   . Guaifenesin & Derivatives   . Penicillins     REACTION: unspecified    Family History  Problem Relation Age of Onset  . Diabetes Mother   . Hypertension Mother   . Heart disease Father      Prior to Admission medications   Medication Sig Start Date End Date Taking? Authorizing Provider  albuterol (PROVENTIL) (2.5 MG/3ML) 0.083% nebulizer solution Take 2.5 mg by nebulization 4 (four) times daily. And every 4 hours as needed for wheezing 02/11/12  Yes Simonne Martinet, NP  allopurinol (ZYLOPRIM) 100 MG tablet Take 100 mg by mouth 2 (two) times daily.     Yes Historical Provider, MD  aspirin 81 MG tablet Take 81 mg by mouth daily.     Yes Historical Provider, MD  atenolol (TENORMIN) 50 MG tablet Take 50 mg by mouth daily.    Yes Historical Provider, MD  aztreonam (AZACTAM) 1 G injection 1 g every 6 (six) hours.  08/03/13  Yes Historical Provider, MD  budesonide (PULMICORT) 0.25 MG/2ML nebulizer solution Take 0.25 mg by nebulization 4 (four) times daily.    Yes Historical Provider, MD  chlorpheniramine-HYDROcodone (TUSSIONEX) 10-8 MG/5ML LQCR Take 5 mLs by mouth at bedtime as  needed.   Yes Historical Provider, MD  Cholecalciferol (VITAMIN D3) 50000 UNITS CAPS Take 1 tablet by mouth daily.   Yes Historical Provider, MD  dextromethorphan (DELSYM) 30 MG/5ML liquid Take 60 mg by mouth every 12 (twelve) hours as needed.    Yes Historical Provider, MD  epoetin alfa (EPOGEN,PROCRIT) 16109 UNIT/ML injection Inject 10,000 Units into the skin once a week.     Yes Historical Provider, MD  ferrous sulfate 325 (65 FE) MG tablet Take 325 mg by mouth 2 (two) times daily.     Yes Historical Provider, MD  insulin glargine (LANTUS) 100 UNIT/ML injection Inject 12 Units into the skin at bedtime.    Yes Historical Provider, MD  isosorbide mononitrate (IMDUR) 30 MG 24 hr tablet Take 15 mg by mouth daily.   Yes Historical Provider, MD  lidocaine (LIDODERM) 5 % Place 1 patch onto the skin daily. Remove & Discard patch within 12 hours or as directed by MD   Yes Historical Provider, MD  LOVAZA 1 G capsule Take 1 tablet by mouth daily.   Yes Historical Provider, MD  omeprazole (PRILOSEC) 20 MG capsule Take 20 mg by mouth daily.     Yes Historical Provider, MD  oseltamivir (TAMIFLU) 75 MG capsule Take 75 mg by mouth daily.   Yes Historical Provider, MD  phenazopyridine (PYRIDIUM) 100 MG tablet Take 100 mg by mouth every 6 (six) hours as needed (dysuria).   Yes Historical Provider, MD  polyethylene glycol (MIRALAX / GLYCOLAX) packet Take 17 g by mouth every 3 (three) days.   Yes Historical Provider, MD  predniSONE (DELTASONE) 5 MG tablet Take 5 mg by mouth daily with breakfast.   Yes Historical Provider, MD  Probiotic Product (PROBIOTIC DAILY PO) Take 1 capsule by mouth daily.   Yes Historical Provider, MD  rosuvastatin (CRESTOR) 20 MG tablet Take 20 mg by mouth daily.     Yes Historical Provider, MD  senna (SENOKOT) 8.6 MG tablet Take 1 tablet by mouth daily.   Yes Historical Provider, MD  tiotropium (SPIRIVA) 18 MCG inhalation capsule Place 18 mcg into inhaler and inhale daily.     Yes Historical  Provider, MD  torsemide (DEMADEX) 20 MG tablet Take 40 mg by mouth daily.    Yes Historical Provider, MD   Physical Exam: Filed Vitals:   09/14/13 2030  BP:   Pulse:   Temp: 98.4 F (36.9 C)  Resp:     BP 125/45  Pulse 71  Temp(Src) 98.4 F (36.9 C) (Oral)  Resp 20  Ht 4\' 11"  (1.499 m)  Wt 72.5 kg (159 lb 13.3 oz)  BMI 32.27 kg/m2  SpO2 96%  General:  Slightly confused and lethargic. Not fully interactive., Otherwise no acute distress. HEENT normocephalic, right-sided face is bruised from  fall. Mucous members are dry Cardiovascular: Regular rhythm, with frequent ectopic beats. 2/6 systolic ejection murmur Lungs: Decreased breath sounds throughout Abdomen: Soft, nontender, nondistended, positive bowel sounds Extremities: No clubbing or cyanosis, 1-2+ edema-mostly nonpitting. Signs of chronic venous stasis Psychiatry: Patient appears to be slightly obtunded. Only somewhat interactive. Skin: As mentioned significant bruising on face, chronic venous stasis           Labs on Admission:  Basic Metabolic Panel:  Recent Labs Lab 09/14/13 1525  NA 135*  K 4.0  CL 90*  CO2 27  GLUCOSE 141*  BUN 81*  CREATININE 3.75*  CALCIUM 9.1   Liver Function Tests:  Recent Labs Lab 09/14/13 1525  AST 57*  ALT 25  ALKPHOS 62  BILITOT 0.3  PROT 7.5  ALBUMIN 3.2*   No results found for this basename: LIPASE, AMYLASE,  in the last 168 hours No results found for this basename: AMMONIA,  in the last 168 hours CBC:  Recent Labs Lab 09/14/13 1525  WBC 9.0  HGB 10.2*  HCT 32.7*  MCV 86.1  PLT 161   Cardiac Enzymes: No results found for this basename: CKTOTAL, CKMB, CKMBINDEX, TROPONINI,  in the last 168 hours  BNP (last 3 results)  Recent Labs  10/09/12 1155 09/14/13 1525  PROBNP 97.0 8732.0*   CBG: No results found for this basename: GLUCAP,  in the last 168 hours  Radiological Exams on Admission: Dg Chest 2 View  09/14/2013   CLINICAL DATA:  Fall.   Shortness breath.  EXAM: CHEST  2 VIEW  COMPARISON:  10/03/2012 CT.  02/11/2012 chest x-ray.  FINDINGS: Frontal view limited by overlying soft tissue.  Sequential pacemaker in place with leads unchanged in position. Cardiomegaly.  Calcified tortuous aorta.  Pulmonary vascular prominence most notable centrally.  No segmental consolidation or gross pneumothorax.  The CT described tiny pulmonary nodules are not adequately assessed by plain film exam. Please see prior CT report impression 3.  IMPRESSION: Frontal view limited by overlying soft tissue.  Sequential pacemaker in place with leads unchanged in position. Cardiomegaly.  Calcified tortuous aorta.  Pulmonary vascular prominence most notable centrally.  No segmental consolidation or gross pneumothorax.  The CT described tiny pulmonary nodules are not adequately assessed by plain film exam. Please see prior CT report impression 3.   Electronically Signed   By: [name] Larsson M.D.   On: 09/14/2013 16:38   Ct Head Wo Contrast  09/14/2013   CLINICAL DATA:  Fall.  Head laceration.  EXAM: CT HEAD WITHOUT CONTRAST  CT CERVICAL SPINE WITHOUT CONTRAST  TECHNIQUE: Multidetector CT imaging of the head and cervical spine was performed following the standard protocol without intravenous contrast. Multiplanar CT image reconstructions of the cervical spine were also generated.  COMPARISON:  01/20/2005 .  FINDINGS: CT HEAD FINDINGS  Images were obtained and reconstructed to approximate a normal head CT angulation. The patient's face inch scan are lower de against the chest, with the neck flexed.  Stable appearance of remote right frontal lobe infarct. Otherwise, the brainstem, cerebellum, cerebral peduncles, thalamus, basal ganglia, basilar cisterns, and ventricular system appear within normal limits. No intracranial hemorrhage, mass lesion, or acute CVA.  There is opacification of multiple ethmoid air cells as well as potential air-fluid level in the left sphenoid sinus. Despite  efforts by the technologist and patient, motion artifact is present on today's exam and could not be eliminated. This reduces exam sensitivity and specificity.  CT CERVICAL SPINE FINDINGS  The cervical  spine is prominently flexed during imaging. There is motion artifact which reduces diagnostic sensitivity and specificity.  Prominent multilevel facet arthropathy noted. Suspected partial facet fusion on the right at C2-3.  There is 2.5 mm of anterior subluxation at the C3-4 level associated with prominent degenerative facet arthropathy. I do not observe a fracture.  There is considerable loss of intervertebral disc height at C5-6 and C6-7, and at C3-4. Posterior osseous ridging noted at C5-6  IMPRESSION: 1. Remote right frontal lobe infarct. No acute intracranial findings observed. 2. Acute on chronic paranasal sinusitis. 3. 2.5 mm of anterior subluxation at C3-4 is probably degenerative. No fracture observed. 4. Cervical spondylosis and degenerative disc disease. 5. Despite efforts by the technologist and patient, motion artifact is present on today's exam and could not be eliminated. This reduces exam sensitivity and specificity. 6. The neck was markedly flexed during imaging. We performed multiplanar reformatting in order to approximate normal scan planes and facilitate comparison and pattern recognition.   Electronically Signed   By: Herbie Baltimore M.D.   On: 09/14/2013 16:52   Ct Cervical Spine Wo Contrast  09/14/2013   CLINICAL DATA:  Fall.  Head laceration.  EXAM: CT HEAD WITHOUT CONTRAST  CT CERVICAL SPINE WITHOUT CONTRAST  TECHNIQUE: Multidetector CT imaging of the head and cervical spine was performed following the standard protocol without intravenous contrast. Multiplanar CT image reconstructions of the cervical spine were also generated.  COMPARISON:  01/20/2005 .  FINDINGS: CT HEAD FINDINGS  Images were obtained and reconstructed to approximate a normal head CT angulation. The patient's face inch  scan are lower de against the chest, with the neck flexed.  Stable appearance of remote right frontal lobe infarct. Otherwise, the brainstem, cerebellum, cerebral peduncles, thalamus, basal ganglia, basilar cisterns, and ventricular system appear within normal limits. No intracranial hemorrhage, mass lesion, or acute CVA.  There is opacification of multiple ethmoid air cells as well as potential air-fluid level in the left sphenoid sinus. Despite efforts by the technologist and patient, motion artifact is present on today's exam and could not be eliminated. This reduces exam sensitivity and specificity.  CT CERVICAL SPINE FINDINGS  The cervical spine is prominently flexed during imaging. There is motion artifact which reduces diagnostic sensitivity and specificity.  Prominent multilevel facet arthropathy noted. Suspected partial facet fusion on the right at C2-3.  There is 2.5 mm of anterior subluxation at the C3-4 level associated with prominent degenerative facet arthropathy. I do not observe a fracture.  There is considerable loss of intervertebral disc height at C5-6 and C6-7, and at C3-4. Posterior osseous ridging noted at C5-6  IMPRESSION: 1. Remote right frontal lobe infarct. No acute intracranial findings observed. 2. Acute on chronic paranasal sinusitis. 3. 2.5 mm of anterior subluxation at C3-4 is probably degenerative. No fracture observed. 4. Cervical spondylosis and degenerative disc disease. 5. Despite efforts by the technologist and patient, motion artifact is present on today's exam and could not be eliminated. This reduces exam sensitivity and specificity. 6. The neck was markedly flexed during imaging. We performed multiplanar reformatting in order to approximate normal scan planes and facilitate comparison and pattern recognition.   Electronically Signed   By: Herbie Baltimore M.D.   On: 09/14/2013 16:52    EKG: Independently reviewed. Prolonged QT interval of 470, normal sinus  rhythm  Assessment/Plan Active Problems:   DM (diabetes mellitus), type 2 with renal complications: Blood sugars stable for now. By mouth, every 4 hours sliding scale only.  Other and unspecified hyperlipidemia: N.p.o. for now   HYPERTENSION: Blood pressure is actually stable.    Acute on chronic diastolic heart failure: BNP at 8700, even with renal failure, possibly volume overloaded. We'll start gentle diuresis. Noting increased creatinine and stable blood pressures.    CKD (chronic kidney disease) stage 4, GFR 15-29 ml/min: Increased from baseline. Creatinine 6 weeks ago was at 2.3. This may also explain patient's fall    COPD exacerbation with secondary hypercarbia: CO2 retention. Patient may have been on too much oxygen at her nursing facility. For now treat with nebulizers and oxygen.:     Encephalopathy, metabolic: Patient's mentation is worse than normal with reported earlier in the emergency room. Likely she started to get obtunded from CO2 retention. Check repeat blood gas and likely may need to intubate soon    Facial laceration: Secondary to fall. Try to limit anticoagulation. No evidence of facial fracture   Code Status: Reported full code  Family Communication: Unable to reach family.  Disposition Plan: Likely be in ICU for several days  Time spent: 40 minutes  [name],Norell Brisbin K [facility] Pager [phone]

## 2013-09-14 NOTE — ED Provider Notes (Signed)
LACERATION REPAIR Date/Time: 09/14/2013 4:51 PM Performed by: Coral CeoPALMER, [name] Gaut K Authorized by: Jillyn LedgerPALMER, [name] Consent: Verbal consent obtained. Consent given by: patient Patient identity confirmed: verbally with patient Body area: head/neck Location details: right eyebrow Laceration length: 3 cm Foreign bodies: no foreign bodies Tendon involvement: none Nerve involvement: none Vascular damage: no Anesthesia: local infiltration Local anesthetic: lidocaine 1% without epinephrine Anesthetic total: 5 ml Patient sedated: no Preparation: Patient was prepped and draped in the usual sterile fashion. Irrigation solution: saline Irrigation method: jet lavage Amount of cleaning: standard Debridement: minimal Degree of undermining: none Skin closure: 5-0 Prolene Number of sutures: 3 Technique: simple Approximation: close Approximation difficulty: simple Dressing: antibiotic ointment and 4x4 sterile gauze Patient tolerance: Patient tolerated the procedure well with no immediate complications.   Tetanus booster given in ED   Laceration repaired for Dr. Roselyn BeringJ. Knapp     Jillyn LedgerJessica K Angelisse Riso, PA-C 09/14/13 [phone]

## 2013-09-14 NOTE — ED Notes (Signed)
Bed: [facility] Expected date:  Expected time:  Means of arrival:  Comments: EMS fall  

## 2013-09-14 NOTE — ED Notes (Addendum)
Per EMS: [facility] Pt had a fall that occurred approximately 1.5 hours ago. Per paperwork, "Resident is on day 3 of Tamiflu and Avelox for possible flu with COPD exacerbation, she has been afebrile for 24 hours." Pt presents to department with gauze dressing that was applied at facility with bleeding controlled.

## 2013-09-14 NOTE — Progress Notes (Signed)
Pt refused BIPAP RN and  MD notified.

## 2013-09-14 NOTE — ED Notes (Signed)
Pt refuses to wear Bipap. RT and EDP had made one previous attempt to encourage patient to wear Bipap, which the patient wore for a brief time. At this time patient refuses to wear Bipap despite encouragement from the writer. RT and MD notified.

## 2013-09-14 NOTE — Progress Notes (Signed)
Utilization Review completed.  Lanea Vankirk RN CM  

## 2013-09-14 NOTE — Telephone Encounter (Signed)
Noted and not surprised

## 2013-09-14 NOTE — Procedures (Signed)
Intubation Procedure Note Drue Dundna M Erazo [id number] [birth year]  Procedure: Intubation Indications: Respiratory insufficiency  Procedure Details Consent: Risks of procedure as well as the alternatives and risks of each were explained to the (patient/caregiver).  Consent for procedure obtained. Verbally from son. Time Out: Verified patient identification, verified procedure, site/side was marked, verified correct patient position, special equipment/implants available, medications/allergies/relevent history reviewed, required imaging and test results available.  Performed  Maximum sterile technique was used including gloves, hand hygiene and mask.   Glidescope 3 used Grade 1 view 1 attempt Good color change, bilateral breath sounds Premeds included 200 Fentanyl, 35 Propofol, 40 Roc  Evaluation Hemodynamic Status: BP stable throughout; O2 sats: stable throughout Patient's Current Condition: stable Complications: No apparent complications Patient did tolerate procedure well. Chest X-ray ordered to verify placement.  CXR: pending.   Twala Collings R. 09/14/2013

## 2013-09-14 NOTE — Progress Notes (Signed)
Pt had some bigeminy, EKG taken per protocol, [facility], NP called.

## 2013-09-14 NOTE — Progress Notes (Signed)
ABG results called to Glade NurseE-LINK, MD and M. Lynch, NP.  RT at bedside to place BIPAP.

## 2013-09-14 NOTE — H&P (Addendum)
PULMONARY  / CRITICAL CARE MEDICINE HISTORY AND PHYSICAL EXAMINATION  Name: [name] MRN: [id number] DOB: 08-Jan-1928    ADMISSION DATE:  09/14/2013  CHIEF COMPLAINT:  Fall  BRIEF PATIENT DESCRIPTION: 80 F with GOLD C COPD presenting with respiratory failure in setting of fall.   SIGNIFICANT EVENTS / STUDIES:  1. CT Head/C-Spine: No acute process.   LINES / TUBES: 1. ETT 09/14/2013 2. PIV 09/14/2013  CULTURES: 1. Blood Cx 1/2 - P 2. Urine Culture 1/2 - P 3. Sputum Culture 1/2 - P 4. RVP 1/2 - P  ANTIBIOTICS: 1. Vanc 1/2 - 2. Cefepime 1/2 -  3. Azithro 1/2 - 4. Tamiflu 1/2 -  HISTORY OF PRESENT ILLNESS: [name] is an 80 yo F with multiple medical issues some of the highlights of which include COPD (Gold C per Dr. Delford Field), HFpEF (last echo 2009), CAD who presented to [facility] today following a fall. The patient is currently unable to provide any history and the following is obtained from chart review. She told the ED physician that she felt weak when standing and fell, striking her head. She has recently been treated with Avelox and Tamiflu for a presumed COPD exacerbation. I was called to see her for worsening AMS. She required urgent intubation on my arrival. Both her son, Margarita Grizzle [phone]), and her brother, Vaughan Browner [phone]), indicated that she would want intubation.  PAST MEDICAL HISTORY :  Past Medical History  Diagnosis Date  . COPD (chronic obstructive pulmonary disease)   . Diastolic congestive heart failure   . C. difficile colitis   . Hypertension   . Hyperlipidemia   . CAD (coronary artery disease)   . Anemia   . Type 2 diabetes mellitus   . Pacemaker 2002  . Cardiac arrest - asystole 2002  . Gallstones   . Pulmonary hypertension   . Renal insufficiency   . Osteoarthritis   . Allergic rhinitis   . Gout   . Stasis dermatitis   . Sinoatrial node dysfunction   . Syncope   . Postsurgical percutaneous transluminal coronary angioplasty status    . Dyslipidemia   . Acute bronchitis   . History of cholelithiasis   . Obesity     Past Surgical History  Procedure Laterality Date  . Ptca  2002  . Appendectomy    . Vesicovaginal fistula closure w/ tah    . Pacemaker placement  2002    Prior to Admission medications   Medication Sig Start Date End Date Taking? Authorizing Provider  albuterol (PROVENTIL) (2.5 MG/3ML) 0.083% nebulizer solution Take 2.5 mg by nebulization 4 (four) times daily. And every 4 hours as needed for wheezing 02/11/12  Yes Simonne Martinet, NP  allopurinol (ZYLOPRIM) 100 MG tablet Take 100 mg by mouth 2 (two) times daily.     Yes Historical Provider, MD  aspirin 81 MG tablet Take 81 mg by mouth daily.     Yes Historical Provider, MD  atenolol (TENORMIN) 50 MG tablet Take 50 mg by mouth daily.    Yes Historical Provider, MD  aztreonam (AZACTAM) 1 G injection 1 g every 6 (six) hours.  08/03/13  Yes Historical Provider, MD  budesonide (PULMICORT) 0.25 MG/2ML nebulizer solution Take 0.25 mg by nebulization 4 (four) times daily.    Yes Historical Provider, MD  chlorpheniramine-HYDROcodone (TUSSIONEX) 10-8 MG/5ML LQCR Take 5 mLs by mouth at bedtime as needed.   Yes Historical Provider, MD  Cholecalciferol (VITAMIN D3) 50000 UNITS CAPS Take 1 tablet  by mouth daily.   Yes Historical Provider, MD  dextromethorphan (DELSYM) 30 MG/5ML liquid Take 60 mg by mouth every 12 (twelve) hours as needed.    Yes Historical Provider, MD  epoetin alfa (EPOGEN,PROCRIT) 40981 UNIT/ML injection Inject 10,000 Units into the skin once a week.     Yes Historical Provider, MD  ferrous sulfate 325 (65 FE) MG tablet Take 325 mg by mouth 2 (two) times daily.     Yes Historical Provider, MD  insulin glargine (LANTUS) 100 UNIT/ML injection Inject 12 Units into the skin at bedtime.    Yes Historical Provider, MD  isosorbide mononitrate (IMDUR) 30 MG 24 hr tablet Take 15 mg by mouth daily.   Yes Historical Provider, MD  lidocaine (LIDODERM) 5 % Place  1 patch onto the skin daily. Remove & Discard patch within 12 hours or as directed by MD   Yes Historical Provider, MD  LOVAZA 1 G capsule Take 1 tablet by mouth daily.   Yes Historical Provider, MD  omeprazole (PRILOSEC) 20 MG capsule Take 20 mg by mouth daily.     Yes Historical Provider, MD  oseltamivir (TAMIFLU) 75 MG capsule Take 75 mg by mouth daily.   Yes Historical Provider, MD  phenazopyridine (PYRIDIUM) 100 MG tablet Take 100 mg by mouth every 6 (six) hours as needed (dysuria).   Yes Historical Provider, MD  polyethylene glycol (MIRALAX / GLYCOLAX) packet Take 17 g by mouth every 3 (three) days.   Yes Historical Provider, MD  predniSONE (DELTASONE) 5 MG tablet Take 5 mg by mouth daily with breakfast.   Yes Historical Provider, MD  Probiotic Product (PROBIOTIC DAILY PO) Take 1 capsule by mouth daily.   Yes Historical Provider, MD  rosuvastatin (CRESTOR) 20 MG tablet Take 20 mg by mouth daily.     Yes Historical Provider, MD  senna (SENOKOT) 8.6 MG tablet Take 1 tablet by mouth daily.   Yes Historical Provider, MD  tiotropium (SPIRIVA) 18 MCG inhalation capsule Place 18 mcg into inhaler and inhale daily.     Yes Historical Provider, MD  torsemide (DEMADEX) 20 MG tablet Take 40 mg by mouth daily.    Yes Historical Provider, MD    Allergies  Allergen Reactions  . Codeine     REACTION: unspecified  . Doxycycline   . Guaifenesin & Derivatives   . Penicillins     REACTION: unspecified    FAMILY HISTORY:  Family History  Problem Relation Age of Onset  . Diabetes Mother   . Hypertension Mother   . Heart disease Father     SOCIAL HISTORY:  reports that she quit smoking about 28 years ago. Her smoking use included Cigarettes. She has a 50 pack-year smoking history. She has never used smokeless tobacco. She reports that she does not drink alcohol. Her drug history is not on file.  REVIEW OF SYSTEMS:  Unable to obtain secondary to patient condition.  PHYSICAL EXAM  VITAL  SIGNS: Temp:  [98.4 F (36.9 C)-99.5 F (37.5 C)] 98.4 F (36.9 C) (01/02 2030) Pulse Rate:  [63-103] 103 (01/02 2158) Resp:  [20-21] 21 (01/02 2030) BP: (96-139)/(45-56) 96/48 mmHg (01/02 2158) SpO2:  [96 %-99 %] 99 % (01/02 2158) FiO2 (%):  [40 %] 40 % (01/02 2321) Weight:  [159 lb 13.3 oz (72.5 kg)] 159 lb 13.3 oz (72.5 kg) (01/02 2033)  HEMODYNAMICS:    VENTILATOR SETTINGS: Vent Mode:  [-] PRVC FiO2 (%):  [40 %] 40 % Set Rate:  [10 bmp-20 bmp]  20 bmp Vt Set:  [350 mL] 350 mL PEEP:  [5 cmH20-6 cmH20] 5 cmH20  INTAKE / OUTPUT: Intake/Output     01/02 0701 - 01/03 0700   Other 60   Total Intake(mL/kg) 60 (0.8)   Net +60         PHYSICAL EXAMINATION: General:  Elderly F  Neuro:  Non-responsive to sternal rub HEENT:  Sclera anicteric, conjunctiva pink, MMM, OP Clear Neck:  Trachea supple and midline, (-) LAN or JVD Cardiovascular:  RRR, NS1/S2, (-) MRG Lungs:  Coarse mechanical breath sounds bilterally Abdomen:  S/NT/ND/(+)BS; L abdominal wall hernia present Musculoskeletal:  (-) C/C/E Skin:  Large R ecchymosis present  LABS:  CBC Recent Labs     09/14/13  1525  WBC  9.0  HGB  10.2*  HCT  32.7*  PLT  161    Coag's Recent Labs     09/14/13  1525  INR  1.22    BMET Recent Labs     09/14/13  1525  09/14/13  2250  NA  135*  135*  K  4.0  4.2  CL  90*  92*  CO2  27  29  BUN  81*  84*  CREATININE  3.75*  3.84*  GLUCOSE  141*  144*    Electrolytes Recent Labs     09/14/13  1525  09/14/13  2250  CALCIUM  9.1  8.8  MG   --   1.9    Sepsis Markers No results found for this basename: LACTICACIDVEN, PROCALCITON, O2SATVEN,  in the last 72 hours  ABG Recent Labs     09/14/13  1518  09/14/13  2117  PHART  7.232*  7.137*  PCO2ART  70.7*  88.7*  PO2ART  112.0*  79.3*    Liver Enzymes Recent Labs     09/14/13  1525  AST  57*  ALT  25  ALKPHOS  62  BILITOT  0.3  ALBUMIN  3.2*    Cardiac Enzymes Recent Labs     09/14/13  1525   PROBNP  8732.0*    Glucose Recent Labs     09/14/13  2109  GLUCAP  154*    Imaging Dg Chest 2 View  09/14/2013   CLINICAL DATA:  Fall.  Shortness breath.  EXAM: CHEST  2 VIEW  COMPARISON:  10/03/2012 CT.  02/11/2012 chest x-ray.  FINDINGS: Frontal view limited by overlying soft tissue.  Sequential pacemaker in place with leads unchanged in position. Cardiomegaly.  Calcified tortuous aorta.  Pulmonary vascular prominence most notable centrally.  No segmental consolidation or gross pneumothorax.  The CT described tiny pulmonary nodules are not adequately assessed by plain film exam. Please see prior CT report impression 3.  IMPRESSION: Frontal view limited by overlying soft tissue.  Sequential pacemaker in place with leads unchanged in position. Cardiomegaly.  Calcified tortuous aorta.  Pulmonary vascular prominence most notable centrally.  No segmental consolidation or gross pneumothorax.  The CT described tiny pulmonary nodules are not adequately assessed by plain film exam. Please see prior CT report impression 3.   Electronically Signed   By: [name] LarssonSteve  Olson M.D.   On: 09/14/2013 16:38   Ct Head Wo Contrast  09/14/2013   CLINICAL DATA:  Fall.  Head laceration.  EXAM: CT HEAD WITHOUT CONTRAST  CT CERVICAL SPINE WITHOUT CONTRAST  TECHNIQUE: Multidetector CT imaging of the head and cervical spine was performed following the standard protocol without intravenous contrast. Multiplanar CT image reconstructions of the cervical  spine were also generated.  COMPARISON:  01/20/2005 .  FINDINGS: CT HEAD FINDINGS  Images were obtained and reconstructed to approximate a normal head CT angulation. The patient's face inch scan are lower de against the chest, with the neck flexed.  Stable appearance of remote right frontal lobe infarct. Otherwise, the brainstem, cerebellum, cerebral peduncles, thalamus, basal ganglia, basilar cisterns, and ventricular system appear within normal limits. No intracranial hemorrhage, mass  lesion, or acute CVA.  There is opacification of multiple ethmoid air cells as well as potential air-fluid level in the left sphenoid sinus. Despite efforts by the technologist and patient, motion artifact is present on today's exam and could not be eliminated. This reduces exam sensitivity and specificity.  CT CERVICAL SPINE FINDINGS  The cervical spine is prominently flexed during imaging. There is motion artifact which reduces diagnostic sensitivity and specificity.  Prominent multilevel facet arthropathy noted. Suspected partial facet fusion on the right at C2-3.  There is 2.5 mm of anterior subluxation at the C3-4 level associated with prominent degenerative facet arthropathy. I do not observe a fracture.  There is considerable loss of intervertebral disc height at C5-6 and C6-7, and at C3-4. Posterior osseous ridging noted at C5-6  IMPRESSION: 1. Remote right frontal lobe infarct. No acute intracranial findings observed. 2. Acute on chronic paranasal sinusitis. 3. 2.5 mm of anterior subluxation at C3-4 is probably degenerative. No fracture observed. 4. Cervical spondylosis and degenerative disc disease. 5. Despite efforts by the technologist and patient, motion artifact is present on today's exam and could not be eliminated. This reduces exam sensitivity and specificity. 6. The neck was markedly flexed during imaging. We performed multiplanar reformatting in order to approximate normal scan planes and facilitate comparison and pattern recognition.   Electronically Signed   By: Herbie Baltimore M.D.   On: 09/14/2013 16:52   Ct Cervical Spine Wo Contrast  09/14/2013   CLINICAL DATA:  Fall.  Head laceration.  EXAM: CT HEAD WITHOUT CONTRAST  CT CERVICAL SPINE WITHOUT CONTRAST  TECHNIQUE: Multidetector CT imaging of the head and cervical spine was performed following the standard protocol without intravenous contrast. Multiplanar CT image reconstructions of the cervical spine were also generated.  COMPARISON:   01/20/2005 .  FINDINGS: CT HEAD FINDINGS  Images were obtained and reconstructed to approximate a normal head CT angulation. The patient's face inch scan are lower de against the chest, with the neck flexed.  Stable appearance of remote right frontal lobe infarct. Otherwise, the brainstem, cerebellum, cerebral peduncles, thalamus, basal ganglia, basilar cisterns, and ventricular system appear within normal limits. No intracranial hemorrhage, mass lesion, or acute CVA.  There is opacification of multiple ethmoid air cells as well as potential air-fluid level in the left sphenoid sinus. Despite efforts by the technologist and patient, motion artifact is present on today's exam and could not be eliminated. This reduces exam sensitivity and specificity.  CT CERVICAL SPINE FINDINGS  The cervical spine is prominently flexed during imaging. There is motion artifact which reduces diagnostic sensitivity and specificity.  Prominent multilevel facet arthropathy noted. Suspected partial facet fusion on the right at C2-3.  There is 2.5 mm of anterior subluxation at the C3-4 level associated with prominent degenerative facet arthropathy. I do not observe a fracture.  There is considerable loss of intervertebral disc height at C5-6 and C6-7, and at C3-4. Posterior osseous ridging noted at C5-6  IMPRESSION: 1. Remote right frontal lobe infarct. No acute intracranial findings observed. 2. Acute on chronic paranasal sinusitis. 3.  2.5 mm of anterior subluxation at C3-4 is probably degenerative. No fracture observed. 4. Cervical spondylosis and degenerative disc disease. 5. Despite efforts by the technologist and patient, motion artifact is present on today's exam and could not be eliminated. This reduces exam sensitivity and specificity. 6. The neck was markedly flexed during imaging. We performed multiplanar reformatting in order to approximate normal scan planes and facilitate comparison and pattern recognition.   Electronically  Signed   By: Herbie Baltimore M.D.   On: 09/14/2013 16:52    EKG: (-) ST elevation. PVCs.  CXR: Chest X-ray from today was personally reviewed by me. The ETT in deviated towards the right mainstem bronchus. A hazy left basilar opacity is present.   ASSESSMENT / PLAN: Principal Problem:   Acute respiratory failure Active Problems:   DM (diabetes mellitus), type 2 with renal complications   COPD exacerbation   Encephalopathy, metabolic   Acute on chronic kidney disease, stage 4   (HFpEF) heart failure with preserved ejection fraction   Facial laceration   PULMONARY A/P: 1. Acute hypercarbic respiratory failure: The DDx includes infections (pneumonia, flu), PE, COPD exacerbation, and acute heart failure. On exam, she does not appear significantly volume overloaded and thus acute heart failure is considered less likely. The BNP may be 2/2 PE. There are probably multiple etiologies.  Lung protective ventilation  VAP prevention bundle  Daily SBT  Minimize sedation  Emperic HCAP coverage (Vanc, Aztreonam, Azithro)  Cont Tamiflu  Check Blood, Sputum Cultures  Check respiratory viral panel  Continue empiric prednisone  Standing Albuterol/Ipra  Check D-Dimer, LE dopplers   CARDIOVASCULAR A/P:  1. HFpEF: No evidence of volume overload. Holding diurectics for now given AKI. 2. CAD  Hold diuretics  RENAL A/P:  1. Acute on Chronic Renal Failure: Suspect that patient volume down.  Trial of fluid, if no improvement can investigate further with urine lytes, [place]  GASTROINTESTINAL A/P:  1. No Acute Issue  HEMATOLOGIC A/P:   1. No Acute Issue  INFECTIOUS A/P: 1. Possible Pneumonia and Flu:   Treatment as above  ENDOCRINE A/P: 1. Dm:   Hyperglycemia protocol  NEUROLOGIC A/P: 1. AMS: Likely 2/2 hypercarbia  Monitor  BEST PRACTICE / DISPOSITION Level of Care:  ICU Consultants:  None Code Status:  Full Diet:  Tube Feeds DVT Px:  SQH GI Px:  PPI Skin  Integrity:  Intact Social / Family:  Updated on Phone  TODAY'S SUMMARY:   I have personally obtained a history, examined the patient, evaluated laboratory and imaging results, formulated the assessment and plan and placed orders.  CRITICAL CARE: The patient is critically ill with multiple organ systems failure and requires high complexity decision making for assessment and support, frequent evaluation and titration of therapies, application of advanced monitoring technologies and extensive interpretation of multiple databases. Critical Care Time devoted to patient care services described in this note is 45 minutes.   Evalyn Casco, MD Pulmonary and Critical Care Medicine [facility] Pager: [phone]  09/14/2013, 11:42 PM

## 2013-09-14 NOTE — Progress Notes (Signed)
CS[facility].   Catha Gosselin.Aleksis Jiggetts Stewart, LCSW [phone]  ED CSW .09/14/2013 1526pm

## 2013-09-15 ENCOUNTER — Inpatient Hospital Stay (HOSPITAL_COMMUNITY): Payer: Medicare Other

## 2013-09-15 DIAGNOSIS — I5033 Acute on chronic diastolic (congestive) heart failure: Secondary | ICD-10-CM

## 2013-09-15 DIAGNOSIS — J9600 Acute respiratory failure, unspecified whether with hypoxia or hypercapnia: Secondary | ICD-10-CM

## 2013-09-15 DIAGNOSIS — N179 Acute kidney failure, unspecified: Secondary | ICD-10-CM

## 2013-09-15 DIAGNOSIS — I2699 Other pulmonary embolism without acute cor pulmonale: Secondary | ICD-10-CM

## 2013-09-15 DIAGNOSIS — N184 Chronic kidney disease, stage 4 (severe): Secondary | ICD-10-CM

## 2013-09-15 DIAGNOSIS — J441 Chronic obstructive pulmonary disease with (acute) exacerbation: Principal | ICD-10-CM

## 2013-09-15 LAB — BASIC METABOLIC PANEL
BUN: 81 mg/dL — ABNORMAL HIGH (ref 6–23)
CHLORIDE: 94 meq/L — AB (ref 96–112)
CO2: 26 mEq/L (ref 19–32)
Calcium: 8 mg/dL — ABNORMAL LOW (ref 8.4–10.5)
Creatinine, Ser: 3.68 mg/dL — ABNORMAL HIGH (ref 0.50–1.10)
GFR calc Af Amer: 12 mL/min — ABNORMAL LOW (ref >90)
GFR calc non Af Amer: 10 mL/min — ABNORMAL LOW (ref >90)
Glucose, Bld: 147 mg/dL — ABNORMAL HIGH (ref 70–99)
POTASSIUM: 4 meq/L (ref 3.7–5.3)
Sodium: 135 mEq/L — ABNORMAL LOW (ref 137–147)

## 2013-09-15 LAB — GLUCOSE, CAPILLARY
GLUCOSE-CAPILLARY: 148 mg/dL — AB (ref 70–99)
GLUCOSE-CAPILLARY: 133 mg/dL — AB (ref 70–99)
Glucose-Capillary: 114 mg/dL — ABNORMAL HIGH (ref 70–99)
Glucose-Capillary: 193 mg/dL — ABNORMAL HIGH (ref 70–99)
Glucose-Capillary: 152 mg/dL — ABNORMAL HIGH (ref 70–99)
Glucose-Capillary: 123 mg/dL — ABNORMAL HIGH (ref 70–99)
Glucose-Capillary: 145 mg/dL — ABNORMAL HIGH (ref 70–99)

## 2013-09-15 LAB — CBC
HEMATOCRIT: 27.7 % — AB (ref 36.0–46.0)
Hemoglobin: 8.7 g/dL — ABNORMAL LOW (ref 12.0–15.0)
MCH: 27.4 pg (ref 26.0–34.0)
MCHC: 31.4 g/dL (ref 30.0–36.0)
MCV: 87.1 fL (ref 78.0–100.0)
Platelets: 135 10*3/uL — ABNORMAL LOW (ref 150–400)
RBC: 3.18 MIL/uL — ABNORMAL LOW (ref 3.87–5.11)
RDW: 15 % (ref 11.5–15.5)
WBC: 6.7 10*3/uL (ref 4.0–10.5)

## 2013-09-15 LAB — BLOOD GAS, ARTERIAL
Acid-base deficit: 1.1 mmol/L (ref 0.0–2.0)
Bicarbonate: 26.2 mEq/L — ABNORMAL HIGH (ref 20.0–24.0)
Drawn by: 317871
FIO2: 0.4 %
MECHVT: 0.35 mL
O2 SAT: 98.3 %
PEEP/CPAP: 5 cmH2O
PH ART: 7.264 — AB (ref 7.350–7.450)
PO2 ART: 118 mmHg — AB (ref 80.0–100.0)
Patient temperature: 97.4
RATE: 20 resp/min
TCO2: 25.2 mmol/L (ref 0–100)
pCO2 arterial: 59.1 mmHg (ref 35.0–45.0)

## 2013-09-15 LAB — TROPONIN I
Troponin I: <0.3 ng/mL (ref <0.30)
Troponin I: <0.3 ng/mL (ref <0.30)

## 2013-09-15 LAB — URINALYSIS, ROUTINE W REFLEX MICROSCOPIC
Bilirubin Urine: NEGATIVE
GLUCOSE, UA: NEGATIVE mg/dL
KETONES UR: NEGATIVE mg/dL
Leukocytes, UA: NEGATIVE
Nitrite: NEGATIVE
PH: 5 (ref 5.0–8.0)
Protein, ur: NEGATIVE mg/dL
Specific Gravity, Urine: 1.01 (ref 1.005–1.030)
Urobilinogen, UA: 0.2 mg/dL (ref 0.0–1.0)

## 2013-09-15 LAB — URINE MICROSCOPIC-ADD ON

## 2013-09-15 LAB — D-DIMER, QUANTITATIVE (NOT AT ARMC): D DIMER QUANT: 1.24 ug{FEU}/mL — AB (ref 0.00–0.48)

## 2013-09-15 LAB — LACTIC ACID, PLASMA: LACTIC ACID, VENOUS: 1 mmol/L (ref 0.5–2.2)

## 2013-09-15 MED ORDER — IPRATROPIUM BROMIDE HFA 17 MCG/ACT IN AERS
4.0000 | INHALATION_SPRAY | Freq: Four times a day (QID) | RESPIRATORY_TRACT | Status: DC
  Administered 2013-09-15: 4 via RESPIRATORY_TRACT
  Filled 2013-09-15: qty 12.9

## 2013-09-15 MED ORDER — METHYLPREDNISOLONE SODIUM SUCC 40 MG IJ SOLR
20.0000 mg | Freq: Two times a day (BID) | INTRAMUSCULAR | Status: DC
  Administered 2013-09-15 – 2013-09-18 (×7): 20 mg via INTRAVENOUS
  Filled 2013-09-15: qty 1
  Filled 2013-09-15 (×4): qty 0.5
  Filled 2013-09-15: qty 1
  Filled 2013-09-15 (×2): qty 0.5
  Filled 2013-09-15: qty 1

## 2013-09-15 MED ORDER — VANCOMYCIN HCL IN DEXTROSE 1-5 GM/200ML-% IV SOLN: 1000.0000 mg | Freq: Once | INTRAVENOUS | Status: DC

## 2013-09-15 MED ORDER — FENTANYL CITRATE 0.05 MG/ML IJ SOLN
50.0000 ug | INTRAMUSCULAR | Status: AC | PRN
  Administered 2013-09-15 (×3): 50 ug via INTRAVENOUS
  Filled 2013-09-15 (×2): qty 2

## 2013-09-15 MED ORDER — ATORVASTATIN CALCIUM 40 MG PO TABS
40.0000 mg | ORAL_TABLET | Freq: Every day | ORAL | Status: DC
  Administered 2013-09-15 – 2013-09-18 (×4): 40 mg via ORAL
  Filled 2013-09-15 (×5): qty 1

## 2013-09-15 MED ORDER — BUDESONIDE 0.5 MG/2ML IN SUSP
RESPIRATORY_TRACT | Status: AC
  Filled 2013-09-15: qty 2

## 2013-09-15 MED ORDER — ASPIRIN 81 MG PO CHEW
81.0000 mg | CHEWABLE_TABLET | Freq: Every day | ORAL | Status: DC
  Administered 2013-09-15 – 2013-09-19 (×5): 81 mg via ORAL
  Filled 2013-09-15 (×5): qty 1

## 2013-09-15 MED ORDER — ALBUTEROL SULFATE (2.5 MG/3ML) 0.083% IN NEBU: 2.5000 mg | INHALATION_SOLUTION | RESPIRATORY_TRACT | Status: DC | PRN

## 2013-09-15 MED ORDER — DEXTROSE 5 % IV SOLN
500.0000 mg | Freq: Once | INTRAVENOUS | Status: AC
  Administered 2013-09-15: 500 mg via INTRAVENOUS
  Filled 2013-09-15: qty 500

## 2013-09-15 MED ORDER — VITAL AF 1.2 CAL PO LIQD
1000.0000 mL | ORAL | Status: DC
  Administered 2013-09-15: 1000 mL
  Filled 2013-09-15 (×2): qty 1000

## 2013-09-15 MED ORDER — LEVOFLOXACIN IN D5W 500 MG/100ML IV SOLN
500.0000 mg | INTRAVENOUS | Status: DC
  Administered 2013-09-17: 500 mg via INTRAVENOUS
  Filled 2013-09-15: qty 100

## 2013-09-15 MED ORDER — PREDNISONE 20 MG PO TABS
40.0000 mg | ORAL_TABLET | Freq: Every day | ORAL | Status: DC
  Filled 2013-09-15 (×2): qty 2

## 2013-09-15 MED ORDER — FUROSEMIDE 10 MG/ML IJ SOLN: 20.0000 mg | Freq: Once | INTRAMUSCULAR | Status: DC

## 2013-09-15 MED ORDER — BIOTENE DRY MOUTH MT LIQD
15.0000 mL | Freq: Four times a day (QID) | OROMUCOSAL | Status: DC
  Administered 2013-09-16 (×4): 15 mL via OROMUCOSAL

## 2013-09-15 MED ORDER — DEXTROSE 5 % IV SOLN
500.0000 mg | Freq: Three times a day (TID) | INTRAVENOUS | Status: DC
  Administered 2013-09-15 (×2): 500 mg via INTRAVENOUS
  Filled 2013-09-15 (×2): qty 0.5

## 2013-09-15 MED ORDER — TORSEMIDE 20 MG/2ML IV SOLN
10.0000 mg | Freq: Once | INTRAVENOUS | Status: DC
  Filled 2013-09-15 (×2): qty 1

## 2013-09-15 MED ORDER — ALBUTEROL SULFATE HFA 108 (90 BASE) MCG/ACT IN AERS
4.0000 | INHALATION_SPRAY | Freq: Four times a day (QID) | RESPIRATORY_TRACT | Status: DC
  Administered 2013-09-15: 4 via RESPIRATORY_TRACT
  Filled 2013-09-15: qty 6.7

## 2013-09-15 MED ORDER — FUROSEMIDE 10 MG/ML IJ SOLN
40.0000 mg | Freq: Once | INTRAMUSCULAR | Status: AC
  Administered 2013-09-15: 40 mg via INTRAVENOUS
  Filled 2013-09-15: qty 4

## 2013-09-15 MED ORDER — FENTANYL CITRATE 0.05 MG/ML IJ SOLN: 50.0000 ug | INTRAMUSCULAR | Status: DC | PRN

## 2013-09-15 MED ORDER — PANTOPRAZOLE SODIUM 40 MG IV SOLR
40.0000 mg | Freq: Every day | INTRAVENOUS | Status: DC
  Administered 2013-09-15: 40 mg via INTRAVENOUS
  Filled 2013-09-15 (×2): qty 40

## 2013-09-15 MED ORDER — LEVOFLOXACIN IN D5W 750 MG/150ML IV SOLN
750.0000 mg | Freq: Once | INTRAVENOUS | Status: AC
  Administered 2013-09-15: 750 mg via INTRAVENOUS
  Filled 2013-09-15: qty 150

## 2013-09-15 MED ORDER — BUDESONIDE 0.5 MG/2ML IN SUSP
0.5000 mg | Freq: Two times a day (BID) | RESPIRATORY_TRACT | Status: DC
  Administered 2013-09-15 – 2013-09-19 (×8): 0.5 mg via RESPIRATORY_TRACT
  Filled 2013-09-15 (×13): qty 2

## 2013-09-15 MED ORDER — FUROSEMIDE 10 MG/ML IJ SOLN
80.0000 mg | Freq: Once | INTRAMUSCULAR | Status: AC
  Administered 2013-09-15: 80 mg via INTRAVENOUS
  Filled 2013-09-15: qty 8

## 2013-09-15 MED ORDER — DEXTROSE 5 % IV SOLN: 250.0000 mg | INTRAVENOUS | Status: DC

## 2013-09-15 MED ORDER — OSELTAMIVIR PHOSPHATE 75 MG PO CAPS: 75.0000 mg | ORAL_CAPSULE | Freq: Two times a day (BID) | ORAL | Status: DC

## 2013-09-15 MED ORDER — CHLORHEXIDINE GLUCONATE 0.12 % MT SOLN
15.0000 mL | Freq: Two times a day (BID) | OROMUCOSAL | Status: DC
  Administered 2013-09-15 – 2013-09-16 (×3): 15 mL via OROMUCOSAL
  Filled 2013-09-15 (×3): qty 15

## 2013-09-15 MED ORDER — MIDAZOLAM HCL 2 MG/2ML IJ SOLN
1.0000 mg | INTRAMUSCULAR | Status: DC | PRN
  Administered 2013-09-15 – 2013-09-16 (×4): 2 mg via INTRAVENOUS
  Filled 2013-09-15 (×4): qty 2

## 2013-09-15 MED ORDER — PANTOPRAZOLE SODIUM 40 MG PO PACK
40.0000 mg | PACK | ORAL | Status: DC
  Administered 2013-09-15: 40 mg
  Filled 2013-09-15 (×3): qty 20

## 2013-09-15 MED ORDER — OSELTAMIVIR PHOSPHATE 30 MG PO CAPS
30.0000 mg | ORAL_CAPSULE | Freq: Every day | ORAL | Status: AC
  Administered 2013-09-15 – 2013-09-19 (×5): 30 mg via ORAL
  Filled 2013-09-15 (×5): qty 1

## 2013-09-15 MED ORDER — SODIUM CHLORIDE 0.9 % IV SOLN
50.0000 ug/h | INTRAVENOUS | Status: DC
  Administered 2013-09-15: 50 ug/h via INTRAVENOUS
  Filled 2013-09-15 (×2): qty 50

## 2013-09-15 MED ORDER — ARFORMOTEROL TARTRATE 15 MCG/2ML IN NEBU
15.0000 ug | INHALATION_SOLUTION | Freq: Two times a day (BID) | RESPIRATORY_TRACT | Status: DC
  Administered 2013-09-15 – 2013-09-19 (×8): 15 ug via RESPIRATORY_TRACT
  Filled 2013-09-15 (×11): qty 2

## 2013-09-15 MED ORDER — AZTREONAM 1 G IJ SOLR: 1.0000 g | INTRAMUSCULAR | Status: DC

## 2013-09-15 MED ORDER — VANCOMYCIN HCL IN DEXTROSE 1-5 GM/200ML-% IV SOLN
1000.0000 mg | INTRAVENOUS | Status: DC
  Administered 2013-09-15: 1000 mg via INTRAVENOUS
  Filled 2013-09-15: qty 200

## 2013-09-15 NOTE — Progress Notes (Signed)
Gave two 50 mcg doses out of one vial.  Documented 0.001 in pyxis as waste.

## 2013-09-15 NOTE — Progress Notes (Signed)
Brief Nutrition Note  Consult received for enteral/tube feeding initiation and management.  Adult Enteral Nutrition Protocol initiated. Full assessment to follow.  Admitting Dx: Dehydration [276.51] Respiratory acidosis [276.2] COPD exacerbation [491.21] Acute kidney injury [584.9] Facial laceration, initial encounter [873.40] Fall against object, initial encounter [E888.1]  Body mass index is 32.27 kg/(m^2). Pt meets criteria for Obesity based on current BMI.  Labs:   Recent Labs Lab 09/14/13 1525 09/14/13 2250 09/15/13 0545  NA 135* 135* 135*  K 4.0 4.2 4.0  CL 90* 92* 94*  CO2 27 29 26   BUN 81* 84* 81*  CREATININE 3.75* 3.84* 3.68*  CALCIUM 9.1 8.8 8.0*  MG  --  1.9  --   GLUCOSE 141* 144* 147*    Lloyd HugerSarah F Fraser Busche MS RD LDN Clinical Dietitian [phone]

## 2013-09-15 NOTE — Progress Notes (Signed)
ANTIBIOTIC CONSULT NOTE - INITIAL  Pharmacy Consult for Levofloxacin/tamiflu (stopping vancomycin and cefepime) Indication: AECOPD, rule out flu  Allergies  Allergen Reactions  . Codeine     REACTION: unspecified  . Doxycycline   . Guaifenesin & Derivatives   . Penicillins     REACTION: unspecified    Patient Measurements: Height: 4\' 11"  (149.9 cm) Weight: 159 lb 13.3 oz (72.5 kg) IBW/kg (Calculated) : 43.2  Vital Signs: Temp: 97.4 F (36.3 C) (01/03 0000) Temp src: Oral (01/03 0000) BP: 124/59 mmHg (01/03 0800) Pulse Rate: 70 (01/03 0800) Intake/Output from previous day: 01/02 0701 - 01/03 0700 In: 60  Out: -  Intake/Output from this shift:    Labs:  Recent Labs  09/14/13 1525 09/14/13 2250 09/15/13 0545  WBC 9.0  --  6.7  HGB 10.2*  --  8.7*  PLT 161  --  135*  CREATININE 3.75* 3.84* 3.68*   Estimated Creatinine Clearance: 10 ml/min (by C-G formula based on Cr of 3.68). No results found for this basename: VANCOTROUGH, Leodis Binet, VANCORANDOM, GENTTROUGH, GENTPEAK, GENTRANDOM, TOBRATROUGH, TOBRAPEAK, TOBRARND, AMIKACINPEAK, AMIKACINTROU, AMIKACIN,  in the last 72 hours   Microbiology: Recent Results (from the past 720 hour(s))  MRSA PCR SCREENING     Status: None   Collection Time    09/14/13  8:35 PM      Result Value Range Status   MRSA by PCR NEGATIVE  NEGATIVE Final   Comment:            The GeneXpert MRSA Assay (FDA     approved for NASAL specimens     only), is one component of a     comprehensive MRSA colonization     surveillance program. It is not     intended to diagnose MRSA     infection nor to guide or     monitor treatment for     MRSA infections.    Medical History: Past Medical History  Diagnosis Date  . COPD (chronic obstructive pulmonary disease)   . Diastolic congestive heart failure   . C. difficile colitis   . Hypertension   . Hyperlipidemia   . CAD (coronary artery disease)   . Anemia   . Type 2 diabetes mellitus   .  Pacemaker 2002  . Cardiac arrest - asystole 2002  . Gallstones   . Pulmonary hypertension   . Renal insufficiency   . Osteoarthritis   . Allergic rhinitis   . Gout   . Stasis dermatitis   . Sinoatrial node dysfunction   . Syncope   . Postsurgical percutaneous transluminal coronary angioplasty status   . Dyslipidemia   . Acute bronchitis   . History of cholelithiasis   . Obesity     Medications:  Scheduled:  . arformoterol  15 mcg Nebulization BID  . aspirin  81 mg Oral Daily  . atorvastatin  40 mg Oral q1800  . budesonide (PULMICORT) nebulizer solution  0.5 mg Nebulization BID  . etomidate      . insulin aspart  0-9 Units Subcutaneous Q4H  . lidocaine (cardiac) 100 mg/78ml      . methylPREDNISolone (SOLU-MEDROL) injection  20 mg Intravenous Q12H  . oseltamivir  30 mg Oral Daily  . pantoprazole sodium  40 mg Per Tube Q24H  . succinylcholine      . torsemide  10 mg Intravenous Once   Infusions:  . fentaNYL infusion INTRAVENOUS 50 mcg/hr (09/15/13 0252)   Assessment:  80 yr female with h/o COPD  s/p fall and in respiratory failure; currently intubated  Original orders for vancomycin and aztreonam stopped 12/3am and orders to start levofloxacin as pneumonia is unlikely, continue tamiflu.  She was to have started tamiflu and [facility]oxifloxacin [facility] at [facility]  Prior to admission  1/3 >> Vanc 1gm IV x 1 dose  1/3 >> Aztreonam 500mg  IV x 1 dose 1/3 >> Azith 500mg  IV x 1 dose 1/3 >>Tamiflu x 5 days >> 1/3 >> levofloxacin >>  Tmax: 99.5 F WBCs: 9 Renal: Scr = 3.68 with CrCl ~ 10 ml/min (09/14/12)  1/3 blood: 1/3 urine:  1/3 sputum:  1/3 Viral panel:   Goal of Therapy:  Vancomycin trough level 15-20 mcg/ml  Plan:   Levofloxacin 750mg  IV x 1 then 500mg  IV q24h  Continue tamiflu 30mg  daily as ordered per CrCl < 4430ml/min  Juliette Alcideustin Zeigler, PharmD, BCPS.   Pager: [phone] 09/15/2013,8:32 AM

## 2013-09-15 NOTE — Progress Notes (Signed)
Pt required multiple interventions during the night.  Pt this am vented and fentanyl gtt infusing.  VSS.  Pt family visited during the night as well (son) very emotional when he saw his Mother.  Family reassured.  Seemed to be ok with care thus far.

## 2013-09-15 NOTE — Progress Notes (Signed)
PULMONARY  / CRITICAL CARE MEDICINE HISTORY AND PHYSICAL EXAMINATION  Name: [name] MRN: [id number] DOB: December 28, 1927    ADMISSION DATE:  09/14/2013  CHIEF COMPLAINT:  Fall  BRIEF PATIENT DESCRIPTION: 79 F with GOLD C COPD presenting with respiratory failure in setting of fall.   SIGNIFICANT EVENTS:  STUDIES:  1. CT Head/C-Spine: No acute process.   LINES / TUBES: 1. ETT 09/14/2013 >>   CULTURES: 1. Blood Cx 1/2 -  2. Urine Culture 1/2 -  3. Sputum Culture 1/2 -  4. RVP 1/2 -   ANTIBIOTICS: 1. Vanc 1/2 - 1/3 2. Aztreonam 1/2 - 1/3  3. Azithro 1/2 - 1/3 4. Tamiflu 1/2 - 5. Levaquin 1/3 -   SUBJECTIVE: Denies chest pain.  Indicates she wants ETT out.  Tolerating pressure support.  PHYSICAL EXAM  VITAL SIGNS: Temp:  [97.4 F (36.3 C)-99.5 F (37.5 C)] 97.4 F (36.3 C) (01/03 0000) Pulse Rate:  [59-103] 65 (01/03 0700) Resp:  [17-23] 23 (01/03 0700) BP: (81-157)/(23-66) 114/38 mmHg (01/03 0700) SpO2:  [93 %-100 %] 93 % (01/03 0700) FiO2 (%):  [30 %-40 %] 30 % (01/03 0349) Weight:  [159 lb 13.3 oz (72.5 kg)] 159 lb 13.3 oz (72.5 kg) (01/02 2033) VENTILATOR SETTINGS: Vent Mode:  [-] PRVC FiO2 (%):  [30 %-40 %] 30 % Set Rate:  [10 bmp-20 bmp] 20 bmp Vt Set:  [350 mL] 350 mL PEEP:  [5 cmH20-6 cmH20] 5 cmH20 Plateau Pressure:  [20 cmH20-31 cmH20] 31 cmH20 INTAKE / OUTPUT: Intake/Output     01/02 0701 - 01/03 0700 01/03 0701 - 01/04 0700   Other 60    Total Intake(mL/kg) 60 (0.8)    Net +60            PHYSICAL EXAMINATION: General: no distress Neuro: follows commands, moves extremities HEENT:  Ecchymosis around Rt eye Cardiovascular: regular, no murmur Lungs: decreased breath sounds, no wheeze Abdomen: soft, non tender, reducible hernia Musculoskeletal: no edema Skin: ecchymosis on Rt side  LABS:  CBC Recent Labs     09/14/13  1525  09/15/13  0545  WBC  9.0  6.7  HGB  10.2*  8.7*  HCT  32.7*  27.7*  PLT  161  135*    Coag's Recent Labs      09/14/13  1525  INR  1.22    BMET Recent Labs     09/14/13  1525  09/14/13  2250  09/15/13  0545  NA  135*  135*  135*  K  4.0  4.2  4.0  CL  90*  92*  94*  CO2  27  29  26   BUN  81*  84*  81*  CREATININE  3.75*  3.84*  3.68*  GLUCOSE  141*  144*  147*    Electrolytes Recent Labs     09/14/13  1525  09/14/13  2250  09/15/13  0545  CALCIUM  9.1  8.8  8.0*  MG   --   1.9   --    ABG Recent Labs     09/14/13  1518  09/14/13  2117  09/15/13  0055  PHART  7.232*  7.137*  7.264*  PCO2ART  70.7*  88.7*  59.1*  PO2ART  112.0*  79.3*  118.0*    Liver Enzymes Recent Labs     09/14/13  1525  AST  57*  ALT  25  ALKPHOS  62  BILITOT  0.3  ALBUMIN  3.2*  Cardiac Enzymes Recent Labs     09/14/13  1525  09/15/13  0050  09/15/13  0545  TROPONINI   --   <0.30  <0.30  PROBNP  8732.0*   --    --     Glucose Recent Labs     09/14/13  2109  09/15/13  0002  09/15/13  0302  GLUCAP  154*  114*  193*    Imaging Dg Chest 2 View  09/14/2013   CLINICAL DATA:  Fall.  Shortness breath.  EXAM: CHEST  2 VIEW  COMPARISON:  10/03/2012 CT.  02/11/2012 chest x-ray.  FINDINGS: Frontal view limited by overlying soft tissue.  Sequential pacemaker in place with leads unchanged in position. Cardiomegaly.  Calcified tortuous aorta.  Pulmonary vascular prominence most notable centrally.  No segmental consolidation or gross pneumothorax.  The CT described tiny pulmonary nodules are not adequately assessed by plain film exam. Please see prior CT report impression 3.  IMPRESSION: Frontal view limited by overlying soft tissue.  Sequential pacemaker in place with leads unchanged in position. Cardiomegaly.  Calcified tortuous aorta.  Pulmonary vascular prominence most notable centrally.  No segmental consolidation or gross pneumothorax.  The CT described tiny pulmonary nodules are not adequately assessed by plain film exam. Please see prior CT report impression 3.   Electronically Signed    By: [name] LarssonSteve  Olson M.D.   On: 09/14/2013 16:38   Ct Head Wo Contrast  09/14/2013   CLINICAL DATA:  Fall.  Head laceration.  EXAM: CT HEAD WITHOUT CONTRAST  CT CERVICAL SPINE WITHOUT CONTRAST  TECHNIQUE: Multidetector CT imaging of the head and cervical spine was performed following the standard protocol without intravenous contrast. Multiplanar CT image reconstructions of the cervical spine were also generated.  COMPARISON:  01/20/2005 .  FINDINGS: CT HEAD FINDINGS  Images were obtained and reconstructed to approximate a normal head CT angulation. The patient's face inch scan are lower de against the chest, with the neck flexed.  Stable appearance of remote right frontal lobe infarct. Otherwise, the brainstem, cerebellum, cerebral peduncles, thalamus, basal ganglia, basilar cisterns, and ventricular system appear within normal limits. No intracranial hemorrhage, mass lesion, or acute CVA.  There is opacification of multiple ethmoid air cells as well as potential air-fluid level in the left sphenoid sinus. Despite efforts by the technologist and patient, motion artifact is present on today's exam and could not be eliminated. This reduces exam sensitivity and specificity.  CT CERVICAL SPINE FINDINGS  The cervical spine is prominently flexed during imaging. There is motion artifact which reduces diagnostic sensitivity and specificity.  Prominent multilevel facet arthropathy noted. Suspected partial facet fusion on the right at C2-3.  There is 2.5 mm of anterior subluxation at the C3-4 level associated with prominent degenerative facet arthropathy. I do not observe a fracture.  There is considerable loss of intervertebral disc height at C5-6 and C6-7, and at C3-4. Posterior osseous ridging noted at C5-6  IMPRESSION: 1. Remote right frontal lobe infarct. No acute intracranial findings observed. 2. Acute on chronic paranasal sinusitis. 3. 2.5 mm of anterior subluxation at C3-4 is probably degenerative. No fracture  observed. 4. Cervical spondylosis and degenerative disc disease. 5. Despite efforts by the technologist and patient, motion artifact is present on today's exam and could not be eliminated. This reduces exam sensitivity and specificity. 6. The neck was markedly flexed during imaging. We performed multiplanar reformatting in order to approximate normal scan planes and facilitate comparison and pattern recognition.   Electronically Signed  By: Herbie Baltimore M.D.   On: 09/14/2013 16:52   Ct Cervical Spine Wo Contrast  09/14/2013   CLINICAL DATA:  Fall.  Head laceration.  EXAM: CT HEAD WITHOUT CONTRAST  CT CERVICAL SPINE WITHOUT CONTRAST  TECHNIQUE: Multidetector CT imaging of the head and cervical spine was performed following the standard protocol without intravenous contrast. Multiplanar CT image reconstructions of the cervical spine were also generated.  COMPARISON:  01/20/2005 .  FINDINGS: CT HEAD FINDINGS  Images were obtained and reconstructed to approximate a normal head CT angulation. The patient's face inch scan are lower de against the chest, with the neck flexed.  Stable appearance of remote right frontal lobe infarct. Otherwise, the brainstem, cerebellum, cerebral peduncles, thalamus, basal ganglia, basilar cisterns, and ventricular system appear within normal limits. No intracranial hemorrhage, mass lesion, or acute CVA.  There is opacification of multiple ethmoid air cells as well as potential air-fluid level in the left sphenoid sinus. Despite efforts by the technologist and patient, motion artifact is present on today's exam and could not be eliminated. This reduces exam sensitivity and specificity.  CT CERVICAL SPINE FINDINGS  The cervical spine is prominently flexed during imaging. There is motion artifact which reduces diagnostic sensitivity and specificity.  Prominent multilevel facet arthropathy noted. Suspected partial facet fusion on the right at C2-3.  There is 2.5 mm of anterior  subluxation at the C3-4 level associated with prominent degenerative facet arthropathy. I do not observe a fracture.  There is considerable loss of intervertebral disc height at C5-6 and C6-7, and at C3-4. Posterior osseous ridging noted at C5-6  IMPRESSION: 1. Remote right frontal lobe infarct. No acute intracranial findings observed. 2. Acute on chronic paranasal sinusitis. 3. 2.5 mm of anterior subluxation at C3-4 is probably degenerative. No fracture observed. 4. Cervical spondylosis and degenerative disc disease. 5. Despite efforts by the technologist and patient, motion artifact is present on today's exam and could not be eliminated. This reduces exam sensitivity and specificity. 6. The neck was markedly flexed during imaging. We performed multiplanar reformatting in order to approximate normal scan planes and facilitate comparison and pattern recognition.   Electronically Signed   By: Herbie Baltimore M.D.   On: 09/14/2013 16:52   Portable Chest 1 View  09/15/2013   CLINICAL DATA:  Respiratory failure.  EXAM: PORTABLE CHEST - 1 VIEW  COMPARISON:  09/15/2013  FINDINGS: Endotracheal tube ends in the mid to lower thoracic trachea. Enteric tube crosses the diaphragm. Stable orientation of dual-chamber pacer leads.  Unchanged cardiomegaly. Unchanged hyperinflation and diffuse interstitial coarsening. No evidence of increasing pleural fluid or pneumothorax.  IMPRESSION: 1. Good positioning of endotracheal and new enteric tubes. 2. Bronchial cuffing which could be congestive or bronchitic.   Electronically Signed   By: Tiburcio Pea M.D.   On: 09/15/2013 06:26   Dg Chest Port 1 View  09/15/2013   CLINICAL DATA:  Endotracheal tube placement  EXAM: PORTABLE CHEST - 1 VIEW  COMPARISON:  Prior radiograph performed earlier the same day of.  FINDINGS: Patient is now intubated with the tip of the endotracheal tube somewhat low lying approximately 9 mm above the carina and directed towards the right mainstem bronchus.  Retraction by 2-3 cm is recommended to ovoid slippage into the mainstem bronchi.  Left-sided pacemaker again noted.  Cardiomegaly is stable.  There is diffuse interstitial thickening with pulmonary vascular congestion, consistent with mild pulmonary edema. A left pleural effusion with associated left basilar opacity is present, which may reflect  atelectasis, edema, or possibly infiltrate. No focal infiltrate seen within the right lung. No pneumothorax.  IMPRESSION: 1. Tip of the endotracheal tube positioned approximately 9 mm above the carina and directed towards the right mainstem bronchus. Retraction by 2-3 cm could be considered to avoid slippage into the mainstem bronchi. 2. New pulmonary edema with small left pleural effusion. 3. Patchy left basilar opacity. Atelectasis and/ edema is favored. Focal infiltrate could be considered in the correct clinical setting. Results were called by telephone at the time of interpretation on 09/15/2013 at 12:02 AM to the ICU RN taking care of the patient.   Electronically Signed   By: Rise Mu M.D.   On: 09/15/2013 00:07   Dg Chest Port 1v Same Day  09/15/2013   CLINICAL DATA:  Adjustment of endotracheal tube.  EXAM: PORTABLE CHEST - 1 VIEW SAME DAY  COMPARISON:  Earlier film, same date.  FINDINGS: The endotracheal tube is 4 cm above the carina. The heart and lungs are stable. No pneumothorax.  IMPRESSION: Endotracheal tube in good position, 4 cm above the carina.   Electronically Signed   By: Loralie Champagne M.D.   On: 09/15/2013 00:50   Dg Abd Portable 1v  09/15/2013   CLINICAL DATA:  Orogastric tube placement.  EXAM: PORTABLE ABDOMEN - 1 VIEW  COMPARISON:  05/13/2010.  FINDINGS: An orogastric tube projects over the left abdomen, compatible with a gastric body position. Dual-chamber pacer wires unremarkable where imaged. No evidence of bowel obstruction. Imaged lung bases clear.  IMPRESSION: Orogastric tube ends over the mid stomach.   Electronically Signed    By: Tiburcio Pea M.D.   On: 09/15/2013 04:08    ASSESSMENT / PLAN:   PULMONARY A: Acute respiratory failure 2nd to AECOPD and acute on chronic diastolic heart failure.  She was on Tx for exacerbation on 12/30 with avelox, tamiflu and prednisone as outpt. Hx of GOLD C COPD - Followed by Dr. Delford Field. P: -pressure support wean as tolerated >> not ready for extubation yet -f/u CXR -limit ABG's -brovana, pulmicort nebulizer -prn albuterol -resume spiriva after extubation -change to IV solumedrol 1/03  CARDIOVASCULAR A: Acute on chronic diastolic heart failure. Labile blood pressure 1/03. Hx of HTN, CAD, hyperlipidemia. P:  -goal even to negative fluid balance as tolerated >> demadex 10 mg IV x one on 1/03 -monitor hemodynamics -hold outpt tenormin, imdur for now -resume ASA, crestor >> lipitor while in hospital  RENAL A: Acute kidney injury 2nd in setting of hypoxic respiratory failure. Stage III CKD - Baseline creatinine 2.5 from 10/09/12. P:  -monitor renal fx, urine outpt, electrolytes -optimize hemodynamics  GASTROINTESTINAL A: Nutrition. P: -protonix for SUP -add tube feeds 1/03  HEMATOLOGIC A: Anemia of chronic disease. P:   -f/u CBC -SCD for DVT prevention -hold outpt epogen for now  INFECTIOUS A: AECOPD and possible influenza - unlikely to have pneumonia. P: -continue tamiflu -add levaquin 1/03 -d/c aztreonam, vancomycin  ENDOCRINE A: DM type II with steroid induced hyperglycemia. P: -SSI  NEUROLOGIC A: Acute encephalopathy 2nd to respiratory failure - improved 1/03. P: -monitor clinically -limit sedation while weaning vent  CC time 40 minutes  Coralyn Helling, MD [facility] Pulmonary/Critical Care 09/15/2013, 8:23 AM Pager:  [phone] After 3pm call: [phone]

## 2013-09-15 NOTE — Progress Notes (Addendum)
ANTIBIOTIC CONSULT NOTE - INITIAL  Pharmacy Consult for Vancomycin, Aztreonam, Tamiflu Indication: Pneumonia, r/o flu  Allergies  Allergen Reactions  . Codeine     REACTION: unspecified  . Doxycycline   . Guaifenesin & Derivatives   . Penicillins     REACTION: unspecified    Patient Measurements: Height: 4\' 11"  (149.9 cm) Weight: 159 lb 13.3 oz (72.5 kg) IBW/kg (Calculated) : 43.2  Vital Signs: Temp: 98.4 F (36.9 C) (01/02 2030) Temp src: Oral (01/02 2030) BP: 144/54 mmHg (01/03 0000) Pulse Rate: 63 (01/03 0000) Intake/Output from previous day: 01/02 0701 - 01/03 0700 In: 60  Out: -  Intake/Output from this shift: Total I/O In: 60 [Other:60] Out: -   Labs:  Recent Labs  09/14/13 1525 09/14/13 2250  WBC 9.0  --   HGB 10.2*  --   PLT 161  --   CREATININE 3.75* 3.84*   Estimated Creatinine Clearance: 9.6 ml/min (by C-G formula based on Cr of 3.84). No results found for this basename: VANCOTROUGH, Leodis Binet, VANCORANDOM, GENTTROUGH, GENTPEAK, GENTRANDOM, TOBRATROUGH, TOBRAPEAK, TOBRARND, AMIKACINPEAK, AMIKACINTROU, AMIKACIN,  in the last 72 hours   Microbiology: Recent Results (from the past 720 hour(s))  MRSA PCR SCREENING     Status: None   Collection Time    09/14/13  8:35 PM      Result Value Range Status   MRSA by PCR NEGATIVE  NEGATIVE Final   Comment:            The GeneXpert MRSA Assay (FDA     approved for NASAL specimens     only), is one component of a     comprehensive MRSA colonization     surveillance program. It is not     intended to diagnose MRSA     infection nor to guide or     monitor treatment for     MRSA infections.    Medical History: Past Medical History  Diagnosis Date  . COPD (chronic obstructive pulmonary disease)   . Diastolic congestive heart failure   . C. difficile colitis   . Hypertension   . Hyperlipidemia   . CAD (coronary artery disease)   . Anemia   . Type 2 diabetes mellitus   . Pacemaker 2002  .  Cardiac arrest - asystole 2002  . Gallstones   . Pulmonary hypertension   . Renal insufficiency   . Osteoarthritis   . Allergic rhinitis   . Gout   . Stasis dermatitis   . Sinoatrial node dysfunction   . Syncope   . Postsurgical percutaneous transluminal coronary angioplasty status   . Dyslipidemia   . Acute bronchitis   . History of cholelithiasis   . Obesity     Medications:  Scheduled:  . albuterol  4 puff Inhalation Q6H  . [START ON 09/16/2013] azithromycin  250 mg Intravenous Q24H  . azithromycin  500 mg Intravenous Once  . aztreonam  500 mg Intravenous Q8H  . etomidate      . fentaNYL      . fentaNYL      . insulin aspart  0-9 Units Subcutaneous Q4H  . ipratropium  4 puff Inhalation Q6H  . lidocaine (cardiac) 100 mg/52ml      . oseltamivir  30 mg Oral Daily  . pantoprazole (PROTONIX) IV  40 mg Intravenous QHS  . predniSONE  40 mg Oral Q breakfast  . propofol      . rocuronium      . succinylcholine      .  vancomycin  1,000 mg Intravenous Q48H   Infusions:   Assessment:  [age] yr female with h/o COPD s/p fall and in respiratory failure; currently intubated  Blood, urine, sputum cultures ordered.  Respiratory viral panel ordered.  Pharmacy asked to dose Tamiflu for possible flu  Pharmacy asked to dose Vancomycin and Aztreonam for suspected pneumonia; MD also ordered Azithromycin 500mg  IV x 1 followed by 250mg  IV daily x 4 days.  Scr = 3.84 with CrCl ~ 10 ml/min  Goal of Therapy:  Vancomycin trough level 15-20 mcg/ml  Plan:  Measure antibiotic drug levels at steady state Follow up culture results  Tamiflu 30mg  po daily x 5 days  Aztreonam 500mg  IV q8h  Vancomycin 1gm IV q48h  Will closely watch renal function and modify regimens as needed  May Ozment, Joselyn GlassmanLeann Trefz, PharmD 09/15/2013,12:15 AM

## 2013-09-15 NOTE — Progress Notes (Signed)
VASCULAR LAB PRELIMINARY  PRELIMINARY  PRELIMINARY  PRELIMINARY  Bilateral lower extremity venous Dopplers completed.    Preliminary report:  There is no obvious evidence of DVT or SVT noted in the bilateral lower extremities.  Jillyn Stacey, RVT 09/15/2013, 9:59 AM

## 2013-09-16 ENCOUNTER — Inpatient Hospital Stay (HOSPITAL_COMMUNITY): Payer: Medicare Other

## 2013-09-16 LAB — CBC
HCT: 25 % — ABNORMAL LOW (ref 36.0–46.0)
Hemoglobin: 8.1 g/dL — ABNORMAL LOW (ref 12.0–15.0)
MCH: 27.8 pg (ref 26.0–34.0)
MCHC: 32.4 g/dL (ref 30.0–36.0)
MCV: 85.9 fL (ref 78.0–100.0)
PLATELETS: 131 10*3/uL — AB (ref 150–400)
RBC: 2.91 MIL/uL — ABNORMAL LOW (ref 3.87–5.11)
RDW: 14.9 % (ref 11.5–15.5)
WBC: 8.5 10*3/uL (ref 4.0–10.5)

## 2013-09-16 LAB — GLUCOSE, CAPILLARY
GLUCOSE-CAPILLARY: 120 mg/dL — AB (ref 70–99)
Glucose-Capillary: 147 mg/dL — ABNORMAL HIGH (ref 70–99)
Glucose-Capillary: 163 mg/dL — ABNORMAL HIGH (ref 70–99)
Glucose-Capillary: 171 mg/dL — ABNORMAL HIGH (ref 70–99)
Glucose-Capillary: 153 mg/dL — ABNORMAL HIGH (ref 70–99)
Glucose-Capillary: 163 mg/dL — ABNORMAL HIGH (ref 70–99)
Glucose-Capillary: 157 mg/dL — ABNORMAL HIGH (ref 70–99)
Glucose-Capillary: 136 mg/dL — ABNORMAL HIGH (ref 70–99)
Glucose-Capillary: 141 mg/dL — ABNORMAL HIGH (ref 70–99)

## 2013-09-16 LAB — BASIC METABOLIC PANEL
BUN: 97 mg/dL — ABNORMAL HIGH (ref 6–23)
CALCIUM: 7.9 mg/dL — AB (ref 8.4–10.5)
CO2: 26 mEq/L (ref 19–32)
CREATININE: 4.06 mg/dL — AB (ref 0.50–1.10)
Chloride: 96 mEq/L (ref 96–112)
GFR calc Af Amer: 11 mL/min — ABNORMAL LOW (ref >90)
GFR calc non Af Amer: 9 mL/min — ABNORMAL LOW (ref >90)
GLUCOSE: 175 mg/dL — AB (ref 70–99)
Potassium: 4.1 mEq/L (ref 3.7–5.3)
Sodium: 138 mEq/L (ref 137–147)

## 2013-09-16 LAB — URINE CULTURE
CULTURE: NO GROWTH
Colony Count: NO GROWTH

## 2013-09-16 MED ORDER — LEVALBUTEROL HCL 0.63 MG/3ML IN NEBU: 0.6300 mg | INHALATION_SOLUTION | RESPIRATORY_TRACT | Status: DC | PRN

## 2013-09-16 MED ORDER — FENTANYL CITRATE 0.05 MG/ML IJ SOLN
12.5000 ug | INTRAMUSCULAR | Status: DC | PRN
  Administered 2013-09-16: 25 ug via INTRAVENOUS
  Filled 2013-09-16: qty 2

## 2013-09-16 MED ORDER — PANTOPRAZOLE SODIUM 40 MG IV SOLR
40.0000 mg | INTRAVENOUS | Status: DC
  Administered 2013-09-16 – 2013-09-17 (×2): 40 mg via INTRAVENOUS
  Filled 2013-09-16: qty 40

## 2013-09-16 MED ORDER — INSULIN ASPART 100 UNIT/ML ~~LOC~~ SOLN
0.0000 [IU] | Freq: Three times a day (TID) | SUBCUTANEOUS | Status: DC
  Administered 2013-09-16 – 2013-09-17 (×4): 4 [IU] via SUBCUTANEOUS
  Administered 2013-09-17 – 2013-09-18 (×2): 3 [IU] via SUBCUTANEOUS
  Administered 2013-09-18: 4 [IU] via SUBCUTANEOUS
  Administered 2013-09-18 – 2013-09-19 (×2): 3 [IU] via SUBCUTANEOUS
  Administered 2013-09-19: 4 [IU] via SUBCUTANEOUS

## 2013-09-16 MED ORDER — TIOTROPIUM BROMIDE MONOHYDRATE 18 MCG IN CAPS
18.0000 ug | ORAL_CAPSULE | Freq: Every day | RESPIRATORY_TRACT | Status: DC
Start: 2013-09-16 — End: 2013-09-19
  Administered 2013-09-16 – 2013-09-19 (×4): 18 ug via RESPIRATORY_TRACT
  Filled 2013-09-16: qty 5

## 2013-09-16 MED ORDER — FUROSEMIDE 10 MG/ML IJ SOLN
20.0000 mg | Freq: Once | INTRAMUSCULAR | Status: AC
  Administered 2013-09-16: 20 mg via INTRAVENOUS
  Filled 2013-09-16: qty 2

## 2013-09-16 NOTE — Progress Notes (Signed)
PT Cancellation Note  Patient Details Name: [name] MRN: [id number] DOB: [birth year]   Cancelled Treatment:    Reason Eval/Treat Not Completed: Medical issues which prohibited therapy (pt extubated today. begin tomorrow.)   Rada HayHill, [name] [name] 09/16/2013, 12:24 PM Blanchard KelchKaren Ayden Apodaca PT [phone]

## 2013-09-16 NOTE — Progress Notes (Signed)
Clinical Social Work Department BRIEF PSYCHOSOCIAL ASSESSMENT 09/16/2013  Patient:  [name],[name]     Account Number:  [id number]     Admit date:  09/14/2013  Clinical Social Worker:  Doroteo GlassmanSIMPSON,AMANDA, LCSWA  Date/Time:  09/16/2013 01:16 PM  Referred by:  Physician  Date Referred:  09/16/2013 Referred for  SNF Placement   Other Referral:   Interview type:  Other - See comment Other interview type:   Pt's brother via phone, as Pt on the vent    PSYCHOSOCIAL DATA Living Status:  FACILITY [facility] Level of care:  Skilled Nursing Facility Primary support name:  Mr. Dutch Quintoole Primary support relationship to patient:  SIBLING Degree of support available:   strong    CURRENT CONCERNS Current Concerns  Post-Acute Placement   Other Concerns:    SOCIAL WORK ASSESSMENT / PLAN Spoke with Pt's brother via phone.    Pt's brother confirmed that [facility] and that she will return there upon d/c.    CSW thanked Mr. Dutch Quintoole for his time.    [facility]    Instructed to call back on Monday to speak with SW re: Pt's return.   Assessment/plan status:  Psychosocial Support/Ongoing Assessment of Needs Other assessment/ plan:   Information/referral to community resources:   n/a-from facility    PATIENT'S/FAMILY'S RESPONSE TO PLAN OF CARE: Pt's brother was pleasant and cooperative and is happy for Pt to [facility] [place].    Mr. Dutch Quintoole thanked CSW for time and assistance.   Providence CrosbyAmanda Kamariah Fruchter, LCSWA Clinical Social Work [phone]

## 2013-09-16 NOTE — Progress Notes (Signed)
Extubated pt to 4L Nasal Cannula

## 2013-09-16 NOTE — Progress Notes (Signed)
PULMONARY  / CRITICAL CARE MEDICINE HISTORY AND PHYSICAL EXAMINATION  Name: [name] MRN: [id number] DOB: 10/07/1927    ADMISSION DATE:  09/14/2013  CHIEF COMPLAINT:  Fall  BRIEF PATIENT DESCRIPTION: 25 F with GOLD C COPD presenting with respiratory failure in setting of fall.   SIGNIFICANT EVENTS: 1/2 Admitted 1/3 Diurese 1/4 Extubated, renal fx worse  STUDIES:  CT Head/C-Spine 1/2 >> No acute process Doppler legs 1/3 >> no DVT Renal u/s 1/4 >>   LINES / TUBES: ETT 1/2 >> 1/4  CULTURES: Blood Cx 1/2 >> Urine Culture 1/2 >> negative Sputum Culture 1/2 >>  RVP 1/2 >>   ANTIBIOTICS: Vanc 1/2 >> 1/3 Aztreonam 1/2 >> 1/3  Azithro 1/2 >> 1/3 Tamiflu 1/2 >> Levaquin 1/3 >>   SUBJECTIVE: Doing better with SBT this AM.  PHYSICAL EXAM  VITAL SIGNS: Temp:  [93.2 F (34 C)-98.7 F (37.1 C)] 98.5 F (36.9 C) (01/04 0800) Pulse Rate:  [59-81] 61 (01/04 0700) Resp:  [10-22] 14 (01/04 0700) BP: (78-141)/(29-82) 108/41 mmHg (01/04 0700) SpO2:  [95 %-99 %] 99 % (01/04 0752) FiO2 (%):  [30 %] 30 % (01/04 0754) Weight:  [163 lb 2.3 oz (74 kg)-164 lb 10.9 oz (74.7 kg)] 163 lb 2.3 oz (74 kg) (01/04 0428) VENTILATOR SETTINGS: Vent Mode:  [-] CPAP FiO2 (%):  [30 %] 30 % Set Rate:  [14 bmp] 14 bmp Vt Set:  [380 mL] 380 mL PEEP:  [5 cmH20] 5 cmH20 Pressure Support:  [5 cmH20] 5 cmH20 Plateau Pressure:  [4 cmH20-27 cmH20] 25 cmH20 INTAKE / OUTPUT: Intake/Output     01/03 0701 - 01/04 0700 01/04 0701 - 01/05 0700   I.V. (mL/kg) 115 (1.6)    Other 500    NG/GT 458.7    IV Piggyback 50    Total Intake(mL/kg) 1123.7 (15.2)    Urine (mL/kg/hr) 475 (0.3)    Total Output 475     Net +648.7            PHYSICAL EXAMINATION: General: no distress Neuro: follows commands, moves extremities HEENT:  Ecchymosis around Rt eye Cardiovascular: regular, no murmur Lungs: decreased breath sounds, no wheeze Abdomen: soft, non tender, reducible hernia Musculoskeletal: no  edema Skin: ecchymosis on Rt side  LABS:  CBC Recent Labs     09/14/13  1525  09/15/13  0545  09/16/13  0346  WBC  9.0  6.7  8.5  HGB  10.2*  8.7*  8.1*  HCT  32.7*  27.7*  25.0*  PLT  161  135*  131*    Coag's Recent Labs     09/14/13  1525  INR  1.22    BMET Recent Labs     09/14/13  2250  09/15/13  0545  09/16/13  0346  NA  135*  135*  138  K  4.2  4.0  4.1  CL  92*  94*  96  CO2  29  26  26   BUN  84*  81*  97*  CREATININE  3.84*  3.68*  4.06*  GLUCOSE  144*  147*  175*    Electrolytes Recent Labs     09/14/13  2250  09/15/13  0545  09/16/13  0346  CALCIUM  8.8  8.0*  7.9*  MG  1.9   --    --    ABG Recent Labs     09/14/13  1518  09/14/13  2117  09/15/13  0055  PHART  7.232*  7.137*  7.264*  PCO2ART  70.7*  88.7*  59.1*  PO2ART  112.0*  79.3*  118.0*    Liver Enzymes Recent Labs     09/14/13  1525  AST  57*  ALT  25  ALKPHOS  62  BILITOT  0.3  ALBUMIN  3.2*    Cardiac Enzymes Recent Labs     09/14/13  1525  09/15/13  0050  09/15/13  0545  09/15/13  1210  TROPONINI   --   <0.30  <0.30  <0.30  PROBNP  8732.0*   --    --    --     Glucose Recent Labs     09/15/13  1558  09/15/13  2019  09/15/13  2315  09/16/13  0123  09/16/13  0345  09/16/13  0759  GLUCAP  133*  145*  147*  163*  171*  153*    Imaging Dg Chest 2 View  09/14/2013   CLINICAL DATA:  Fall.  Shortness breath.  EXAM: CHEST  2 VIEW  COMPARISON:  10/03/2012 CT.  02/11/2012 chest x-ray.  FINDINGS: Frontal view limited by overlying soft tissue.  Sequential pacemaker in place with leads unchanged in position. Cardiomegaly.  Calcified tortuous aorta.  Pulmonary vascular prominence most notable centrally.  No segmental consolidation or gross pneumothorax.  The CT described tiny pulmonary nodules are not adequately assessed by plain film exam. Please see prior CT report impression 3.  IMPRESSION: Frontal view limited by overlying soft tissue.  Sequential pacemaker in  place with leads unchanged in position. Cardiomegaly.  Calcified tortuous aorta.  Pulmonary vascular prominence most notable centrally.  No segmental consolidation or gross pneumothorax.  The CT described tiny pulmonary nodules are not adequately assessed by plain film exam. Please see prior CT report impression 3.   Electronically Signed   By: [name] Larsson M.D.   On: 09/14/2013 16:38   Ct Head Wo Contrast  09/14/2013   CLINICAL DATA:  Fall.  Head laceration.  EXAM: CT HEAD WITHOUT CONTRAST  CT CERVICAL SPINE WITHOUT CONTRAST  TECHNIQUE: Multidetector CT imaging of the head and cervical spine was performed following the standard protocol without intravenous contrast. Multiplanar CT image reconstructions of the cervical spine were also generated.  COMPARISON:  01/20/2005 .  FINDINGS: CT HEAD FINDINGS  Images were obtained and reconstructed to approximate a normal head CT angulation. The patient's face inch scan are lower de against the chest, with the neck flexed.  Stable appearance of remote right frontal lobe infarct. Otherwise, the brainstem, cerebellum, cerebral peduncles, thalamus, basal ganglia, basilar cisterns, and ventricular system appear within normal limits. No intracranial hemorrhage, mass lesion, or acute CVA.  There is opacification of multiple ethmoid air cells as well as potential air-fluid level in the left sphenoid sinus. Despite efforts by the technologist and patient, motion artifact is present on today's exam and could not be eliminated. This reduces exam sensitivity and specificity.  CT CERVICAL SPINE FINDINGS  The cervical spine is prominently flexed during imaging. There is motion artifact which reduces diagnostic sensitivity and specificity.  Prominent multilevel facet arthropathy noted. Suspected partial facet fusion on the right at C2-3.  There is 2.5 mm of anterior subluxation at the C3-4 level associated with prominent degenerative facet arthropathy. I do not observe a fracture.  There  is considerable loss of intervertebral disc height at C5-6 and C6-7, and at C3-4. Posterior osseous ridging noted at C5-6  IMPRESSION: 1. Remote right frontal lobe infarct. No acute intracranial  findings observed. 2. Acute on chronic paranasal sinusitis. 3. 2.5 mm of anterior subluxation at C3-4 is probably degenerative. No fracture observed. 4. Cervical spondylosis and degenerative disc disease. 5. Despite efforts by the technologist and patient, motion artifact is present on today's exam and could not be eliminated. This reduces exam sensitivity and specificity. 6. The neck was markedly flexed during imaging. We performed multiplanar reformatting in order to approximate normal scan planes and facilitate comparison and pattern recognition.   Electronically Signed   By: Herbie Baltimore M.D.   On: 09/14/2013 16:52   Ct Cervical Spine Wo Contrast  09/14/2013   CLINICAL DATA:  Fall.  Head laceration.  EXAM: CT HEAD WITHOUT CONTRAST  CT CERVICAL SPINE WITHOUT CONTRAST  TECHNIQUE: Multidetector CT imaging of the head and cervical spine was performed following the standard protocol without intravenous contrast. Multiplanar CT image reconstructions of the cervical spine were also generated.  COMPARISON:  01/20/2005 .  FINDINGS: CT HEAD FINDINGS  Images were obtained and reconstructed to approximate a normal head CT angulation. The patient's face inch scan are lower de against the chest, with the neck flexed.  Stable appearance of remote right frontal lobe infarct. Otherwise, the brainstem, cerebellum, cerebral peduncles, thalamus, basal ganglia, basilar cisterns, and ventricular system appear within normal limits. No intracranial hemorrhage, mass lesion, or acute CVA.  There is opacification of multiple ethmoid air cells as well as potential air-fluid level in the left sphenoid sinus. Despite efforts by the technologist and patient, motion artifact is present on today's exam and could not be eliminated. This reduces  exam sensitivity and specificity.  CT CERVICAL SPINE FINDINGS  The cervical spine is prominently flexed during imaging. There is motion artifact which reduces diagnostic sensitivity and specificity.  Prominent multilevel facet arthropathy noted. Suspected partial facet fusion on the right at C2-3.  There is 2.5 mm of anterior subluxation at the C3-4 level associated with prominent degenerative facet arthropathy. I do not observe a fracture.  There is considerable loss of intervertebral disc height at C5-6 and C6-7, and at C3-4. Posterior osseous ridging noted at C5-6  IMPRESSION: 1. Remote right frontal lobe infarct. No acute intracranial findings observed. 2. Acute on chronic paranasal sinusitis. 3. 2.5 mm of anterior subluxation at C3-4 is probably degenerative. No fracture observed. 4. Cervical spondylosis and degenerative disc disease. 5. Despite efforts by the technologist and patient, motion artifact is present on today's exam and could not be eliminated. This reduces exam sensitivity and specificity. 6. The neck was markedly flexed during imaging. We performed multiplanar reformatting in order to approximate normal scan planes and facilitate comparison and pattern recognition.   Electronically Signed   By: Herbie Baltimore M.D.   On: 09/14/2013 16:52   Dg Chest Port 1 View  09/16/2013   CLINICAL DATA:  Follow-up CHF.  EXAM: PORTABLE CHEST - 1 VIEW  COMPARISON:  09/15/2013.  FINDINGS: 0512 hr. The patient's mandible overlies the superior mediastinum. If the patient is still intubated, the endotracheal tube is not well differentiated from the overlying nasogastric tube. The NG and left subclavian pacemaker leads appear unchanged. There are persistent low lung volumes with bibasilar atelectasis and vascular congestion. Pleural thickening on the left and bibasilar scarring or atelectasis appear unchanged. There is no evidence of pneumothorax. Cardiomegaly and aortic atherosclerosis appear unchanged.   IMPRESSION: Stable chest allowing for positional differences. The head overlies the superior mediastinum ; endotracheal tube is not well visualized.   Electronically Signed   By: Annette Stable  Purcell MoutonVeazey M.D.   On: 09/16/2013 07:53   Portable Chest 1 View  09/15/2013   CLINICAL DATA:  Respiratory failure.  EXAM: PORTABLE CHEST - 1 VIEW  COMPARISON:  09/15/2013  FINDINGS: Endotracheal tube ends in the mid to lower thoracic trachea. Enteric tube crosses the diaphragm. Stable orientation of dual-chamber pacer leads.  Unchanged cardiomegaly. Unchanged hyperinflation and diffuse interstitial coarsening. No evidence of increasing pleural fluid or pneumothorax.  IMPRESSION: 1. Good positioning of endotracheal and new enteric tubes. 2. Bronchial cuffing which could be congestive or bronchitic.   Electronically Signed   By: Tiburcio PeaJonathan  Watts M.D.   On: 09/15/2013 06:26   Dg Chest Port 1 View  09/15/2013   CLINICAL DATA:  Endotracheal tube placement  EXAM: PORTABLE CHEST - 1 VIEW  COMPARISON:  Prior radiograph performed earlier the same day of.  FINDINGS: Patient is now intubated with the tip of the endotracheal tube somewhat low lying approximately 9 mm above the carina and directed towards the right mainstem bronchus. Retraction by 2-3 cm is recommended to ovoid slippage into the mainstem bronchi.  Left-sided pacemaker again noted.  Cardiomegaly is stable.  There is diffuse interstitial thickening with pulmonary vascular congestion, consistent with mild pulmonary edema. A left pleural effusion with associated left basilar opacity is present, which may reflect atelectasis, edema, or possibly infiltrate. No focal infiltrate seen within the right lung. No pneumothorax.  IMPRESSION: 1. Tip of the endotracheal tube positioned approximately 9 mm above the carina and directed towards the right mainstem bronchus. Retraction by 2-3 cm could be considered to avoid slippage into the mainstem bronchi. 2. New pulmonary edema with small left  pleural effusion. 3. Patchy left basilar opacity. Atelectasis and/ edema is favored. Focal infiltrate could be considered in the correct clinical setting. Results were called by telephone at the time of interpretation on 09/15/2013 at 12:02 AM to the ICU RN taking care of the patient.   Electronically Signed   By: Rise MuBenjamin  McClintock M.D.   On: 09/15/2013 00:07   Dg Chest Port 1v Same Day  09/15/2013   CLINICAL DATA:  Adjustment of endotracheal tube.  EXAM: PORTABLE CHEST - 1 VIEW SAME DAY  COMPARISON:  Earlier film, same date.  FINDINGS: The endotracheal tube is 4 cm above the carina. The heart and lungs are stable. No pneumothorax.  IMPRESSION: Endotracheal tube in good position, 4 cm above the carina.   Electronically Signed   By: Loralie ChampagneMark  Gallerani M.D.   On: 09/15/2013 00:50   Dg Abd Portable 1v  09/15/2013   CLINICAL DATA:  Orogastric tube placement.  EXAM: PORTABLE ABDOMEN - 1 VIEW  COMPARISON:  05/13/2010.  FINDINGS: An orogastric tube projects over the left abdomen, compatible with a gastric body position. Dual-chamber pacer wires unremarkable where imaged. No evidence of bowel obstruction. Imaged lung bases clear.  IMPRESSION: Orogastric tube ends over the mid stomach.   Electronically Signed   By: Tiburcio PeaJonathan  Watts M.D.   On: 09/15/2013 04:08    ASSESSMENT / PLAN:   PULMONARY A: Acute respiratory failure 2nd to AECOPD and acute on chronic diastolic heart failure.  She was on Tx for exacerbation on 12/30 with avelox, tamiflu and prednisone as outpt. Hx of GOLD C COPD - Followed by Dr. Delford FieldWright. P: -proceed with extubation 1/04 >> prn BiPAP after extubation -f/u CXR -limit ABG's -brovana, pulmicort nebulizer -prn albuterol -resume spiriva after extubation -changed to IV solumedrol 1/03 >> continue 20 mg q12h for now  CARDIOVASCULAR A: Acute on chronic  diastolic heart failure. Labile blood pressure 1/03. Hx of HTN, CAD, hyperlipidemia. P:  -goal even to negative fluid balance as  tolerated >> lasix 20 mg IV x one on 1/04 -monitor hemodynamics -hold outpt tenormin, imdur for now -resumed ASA, crestor >> lipitor while in hospital  RENAL A: Acute kidney injury 2nd in setting of hypoxic respiratory failure. Stage III CKD - Baseline creatinine 2.5 from 10/09/12. P:  -monitor renal fx, urine outpt, electrolytes -check renal u/s 1/04  GASTROINTESTINAL A: Nutrition. P: -protonix for SUP -advance diet as tolerated after extubation  HEMATOLOGIC A: Anemia of chronic disease. P:   -f/u CBC -SCD for DVT prevention -hold outpt epogen for now  INFECTIOUS A: AECOPD and possible influenza - unlikely to have pneumonia. P: -continue tamiflu -added levaquin 1/03  ENDOCRINE A: DM type II with steroid induced hyperglycemia. P: -SSI  NEUROLOGIC A: Acute encephalopathy 2nd to respiratory failure - improved 1/03. Deconditioning. P: -monitor clinically -OOB, PT consult  Summary: Respiratory status improved after adjustment of solumedrol and diuresis on 1/03.  Will give additional lasix 1/04 and monitor renal fx >> may need to accept worsening renal dysfx for now while diuresing in order to liberate her from ventilator.  CC time 35 minutes  Coralyn Helling, MD [facility] Pulmonary/Critical Care 09/16/2013, 8:26 AM Pager:  [phone] After 3pm call: [phone]

## 2013-09-17 ENCOUNTER — Inpatient Hospital Stay (HOSPITAL_COMMUNITY): Payer: Medicare Other

## 2013-09-17 ENCOUNTER — Encounter: Payer: Self-pay | Admitting: Internal Medicine

## 2013-09-17 DIAGNOSIS — J101 Influenza due to other identified influenza virus with other respiratory manifestations: Secondary | ICD-10-CM | POA: Diagnosis present

## 2013-09-17 LAB — GLUCOSE, CAPILLARY
GLUCOSE-CAPILLARY: 144 mg/dL — AB (ref 70–99)
GLUCOSE-CAPILLARY: 161 mg/dL — AB (ref 70–99)
GLUCOSE-CAPILLARY: 153 mg/dL — AB (ref 70–99)
Glucose-Capillary: 155 mg/dL — ABNORMAL HIGH (ref 70–99)

## 2013-09-17 LAB — CBC
HCT: 26.2 % — ABNORMAL LOW (ref 36.0–46.0)
HEMOGLOBIN: 8.3 g/dL — AB (ref 12.0–15.0)
MCH: 27.3 pg (ref 26.0–34.0)
MCHC: 31.7 g/dL (ref 30.0–36.0)
MCV: 86.2 fL (ref 78.0–100.0)
PLATELETS: 134 10*3/uL — AB (ref 150–400)
RBC: 3.04 MIL/uL — ABNORMAL LOW (ref 3.87–5.11)
RDW: 14.9 % (ref 11.5–15.5)
WBC: 11 10*3/uL — ABNORMAL HIGH (ref 4.0–10.5)

## 2013-09-17 LAB — BASIC METABOLIC PANEL
BUN: 97 mg/dL — ABNORMAL HIGH (ref 6–23)
CHLORIDE: 102 meq/L (ref 96–112)
CO2: 29 meq/L (ref 19–32)
CREATININE: 3.51 mg/dL — AB (ref 0.50–1.10)
Calcium: 8.7 mg/dL (ref 8.4–10.5)
GFR calc Af Amer: 13 mL/min — ABNORMAL LOW (ref >90)
GFR calc non Af Amer: 11 mL/min — ABNORMAL LOW (ref >90)
Glucose, Bld: 167 mg/dL — ABNORMAL HIGH (ref 70–99)
POTASSIUM: 4.4 meq/L (ref 3.7–5.3)
SODIUM: 145 meq/L (ref 137–147)

## 2013-09-17 LAB — RESPIRATORY VIRUS PANEL
Adenovirus: NOT DETECTED
INFLUENZA A H1: NOT DETECTED
INFLUENZA A: DETECTED — AB
INFLUENZA B 1: NOT DETECTED
Influenza A H3: DETECTED — AB
Metapneumovirus: NOT DETECTED
PARAINFLUENZA 3 A: NOT DETECTED
Parainfluenza 1: NOT DETECTED
Parainfluenza 2: NOT DETECTED
RESPIRATORY SYNCYTIAL VIRUS B: NOT DETECTED
Respiratory Syncytial Virus A: NOT DETECTED
Rhinovirus: NOT DETECTED

## 2013-09-17 MED ORDER — BIOTENE DRY MOUTH MT LIQD: 15.0000 mL | Freq: Two times a day (BID) | OROMUCOSAL | Status: DC

## 2013-09-17 MED ORDER — PANTOPRAZOLE SODIUM 40 MG PO TBEC
40.0000 mg | DELAYED_RELEASE_TABLET | Freq: Every day | ORAL | Status: DC
  Administered 2013-09-18: 40 mg via ORAL
  Filled 2013-09-17: qty 1

## 2013-09-17 MED ORDER — BIOTENE DRY MOUTH MT LIQD
15.0000 mL | Freq: Two times a day (BID) | OROMUCOSAL | Status: DC
  Administered 2013-09-18 – 2013-09-19 (×2): 15 mL via OROMUCOSAL

## 2013-09-17 MED ORDER — LEVOFLOXACIN 500 MG PO TABS
500.0000 mg | ORAL_TABLET | ORAL | Status: DC
  Administered 2013-09-19: 500 mg via ORAL
  Filled 2013-09-17: qty 1

## 2013-09-17 NOTE — Progress Notes (Signed)
PULMONARY  / CRITICAL CARE MEDICINE HISTORY AND PHYSICAL EXAMINATION  Name: [name] MRN: [id number] DOB: May 01, 1928    ADMISSION DATE:  09/14/2013  CHIEF COMPLAINT:  Fall  BRIEF PATIENT DESCRIPTION: 8 F with GOLD C COPD presenting with respiratory failure in setting of fall.   SIGNIFICANT EVENTS: 1/2 Admitted 1/3 Diurese 1/4 Extubated, renal fx worse  STUDIES:  CT Head/C-Spine 1/2 >> No acute process Doppler legs 1/3 >> no DVT Renal u/s 1/4 >> no hydro, cortical thinning B, B cysts.   LINES / TUBES: ETT 1/2 >> 1/4  CULTURES: Blood Cx 1/2 >> Urine Culture 1/2 >> negative Sputum Culture 1/2 >>  Viral panel 1/2 >> Flu AH3 POSITIVE  ANTIBIOTICS: Vanc 1/2 >> 1/3 Aztreonam 1/2 >> 1/3  Azithro 1/2 >> 1/3 Tamiflu 1/2 >> Levaquin 1/3 >>   SUBJECTIVE: Feels better, up to chair.  Pleasantly confused and   PHYSICAL EXAM  VITAL SIGNS: Temp:  [97.8 F (36.6 C)-98.4 F (36.9 C)] 98.3 F (36.8 C) (01/05 0800) Pulse Rate:  [60-101] 89 (01/05 1000) Resp:  [14-22] 15 (01/05 1000) BP: (101-173)/(43-85) 164/62 mmHg (01/05 1000) SpO2:  [92 %-100 %] 100 % (01/05 1000) Weight:  [73.4 kg (161 lb 13.1 oz)] 73.4 kg (161 lb 13.1 oz) (01/05 0400) VENTILATOR SETTINGS:   INTAKE / OUTPUT: Intake/Output     01/04 0701 - 01/05 0700 01/05 0701 - 01/06 0700   I.V. (mL/kg) 2.5 (0)    Other 480 60   NG/GT 45    IV Piggyback     Total Intake(mL/kg) 527.5 (7.2) 60 (0.8)   Urine (mL/kg/hr) 1650 (0.9)    Total Output 1650     Net -1122.5 +60          PHYSICAL EXAMINATION: General: no distress Neuro: follows commands, moves extremities HEENT:  Ecchymosis around Rt eye Cardiovascular: regular, no murmur Lungs: decreased breath sounds, soft exp wheeze Abdomen: soft, non tender, reducible hernia Musculoskeletal: no edema Skin: ecchymosis on Rt side  LABS:  CBC Recent Labs     09/15/13  0545  09/16/13  0346  09/17/13  0422  WBC  6.7  8.5  11.0*  HGB  8.7*  8.1*  8.3*   HCT  27.7*  25.0*  26.2*  PLT  135*  131*  134*    Coag's Recent Labs     09/14/13  1525  INR  1.22    BMET Recent Labs     09/15/13  0545  09/16/13  0346  09/17/13  0422  NA  135*  138  145  K  4.0  4.1  4.4  CL  94*  96  102  CO2  26  26  29   BUN  81*  97*  97*  CREATININE  3.68*  4.06*  3.51*  GLUCOSE  147*  175*  167*    Electrolytes Recent Labs     09/14/13  2250  09/15/13  0545  09/16/13  0346  09/17/13  0422  CALCIUM  8.8  8.0*  7.9*  8.7  MG  1.9   --    --    --    ABG Recent Labs     09/14/13  1518  09/14/13  2117  09/15/13  0055  PHART  7.232*  7.137*  7.264*  PCO2ART  70.7*  88.7*  59.1*  PO2ART  112.0*  79.3*  118.0*    Liver Enzymes Recent Labs     09/14/13  1525  AST  57*  ALT  25  ALKPHOS  62  BILITOT  0.3  ALBUMIN  3.2*    Cardiac Enzymes Recent Labs     09/14/13  1525  09/15/13  0050  09/15/13  0545  09/15/13  1210  TROPONINI   --   <0.30  <0.30  <0.30  PROBNP  8732.0*   --    --    --     Glucose Recent Labs     09/16/13  1149  09/16/13  1612  09/16/13  1956  09/16/13  2251  09/16/13  2312  09/17/13  0804  GLUCAP  163*  157*  120*  136*  141*  144*    Imaging US Renal  09/16/2013   CLINICAL DATA:  Acute renal failure.  EXAM: RENAL/URINARY TRACT ULTRASOUND COMPLETE  COMPARISON:  02/23/2007  FINDINGS: Right Kidney:  Length: 10.2 cm. There is cortical thinning and expansion of the central sinus complex. Increased cortical thinning compared to the prior examination. No evidence for right hydronephrosis. Echogenic structure within the right kidney lower pole is nonspecific. There is a poorly defined hypoechoic structure along the right kidney upper pole that measures up to 4.9 cm and probably represents a large cyst.  Left Kidney:  Length: 10.4 cm. Diffuse cortical thinning in the left kidney. Negative for hydronephrosis. Hypoechoic structure in the lateral left kidney measures up to 1.6 cm and most compatible with a  cyst.  Bladder:  Patient has a Foley catheter and this area was not imaged.  IMPRESSION: Cortical thinning in both kidneys is consistent with history of chronic kidney disease. No evidence for hydronephrosis.  Evidence for bilateral renal cysts.  Small echogenic structure along the right kidney lower pole is nonspecific but cannot exclude a nonobstructive stone.   Electronically Signed   By: Richarda Overlie M.D.   On: 09/16/2013 13:38   Dg Chest Port 1 View  09/17/2013   CLINICAL DATA:  Follow-up infiltrates.  EXAM: PORTABLE CHEST - 1 VIEW  COMPARISON:  09/16/2013  FINDINGS: Sequential pacemaker enters from the left with leads unchanged in position. Cardiomegaly.  Pulmonary vascular congestion most notable centrally.  Left base subsegmental atelectasis. No well-defined segmental consolidation separate from this region.  Biapical pleural thickening without associated bony destruction.  No gross pneumothorax.  Calcified mildly tortuous aorta.  Mild curvature of the spine.  IMPRESSION: Cardiomegaly with pacemaker in place.  Pulmonary vascular congestion most notable centrally.  Left base subsegmental atelectasis. No well-defined segmental consolidation separate from this region.   Electronically Signed   By: [name] Larsson M.D.   On: 09/17/2013 07:31   Dg Chest Port 1 View  09/16/2013   CLINICAL DATA:  Follow-up CHF.  EXAM: PORTABLE CHEST - 1 VIEW  COMPARISON:  09/15/2013.  FINDINGS: 0512 hr. The patient's mandible overlies the superior mediastinum. If the patient is still intubated, the endotracheal tube is not well differentiated from the overlying nasogastric tube. The NG and left subclavian pacemaker leads appear unchanged. There are persistent low lung volumes with bibasilar atelectasis and vascular congestion. Pleural thickening on the left and bibasilar scarring or atelectasis appear unchanged. There is no evidence of pneumothorax. Cardiomegaly and aortic atherosclerosis appear unchanged.  IMPRESSION: Stable chest  allowing for positional differences. The head overlies the superior mediastinum ; endotracheal tube is not well visualized.   Electronically Signed   By: Roxy Horseman M.D.   On: 09/16/2013 07:53    ASSESSMENT / PLAN:   PULMONARY A: Acute respiratory  failure 2nd to AECOPD and acute on chronic diastolic heart failure.  She was on Tx for exacerbation on 12/30 with avelox, tamiflu and prednisone as outpt. Hx of GOLD C COPD - Followed by Dr. Delford FieldWright. Influenza A H3 P: -f/u CXR -brovana, pulmicort nebulizer -prn albuterol -resumed spiriva 1/4 -changed to IV solumedrol 1/03 >> continue 20 mg q12h for now  CARDIOVASCULAR A: Acute on chronic diastolic heart failure. Labile blood pressure 1/03. Hx of HTN, CAD, hyperlipidemia. P:  -goal even to negative fluid balance as tolerated  -monitor hemodynamics -hold outpt tenormin, imdur for now -resumed ASA, crestor >> lipitor while in hospital  RENAL A: Acute kidney injury 2nd in setting of hypoxic respiratory failure. Stage III CKD - Baseline creatinine 2.5 from 10/09/12. P:  -monitor renal fx, urine outpt, electrolytes -check renal u/s 1/04  GASTROINTESTINAL A: Nutrition. P: -protonix for SUP -diet ordered  HEMATOLOGIC A: Anemia of chronic disease. P:   -f/u CBC -SCD for DVT prevention -hold outpt epogen for now  INFECTIOUS A: AECOPD + Influenza A P: -continue tamiflu to complete 7 days -added levaquin 1/03, plan complete 5 days total  ENDOCRINE A: DM type II with steroid induced hyperglycemia. P: -SSI  NEUROLOGIC A: Acute encephalopathy 2nd to respiratory failure - improved 1/03. Dementia Deconditioning. P: -monitor clinically -OOB, PT consult  Summary: AE-COPD, Influenza, probable pulm edema. Extubated. Transfer to floor on 1/5.   Levy Pupaobert Christin Moline, MD, PhD 09/17/2013, 12:15 PM [facility] [phone] or if no answer [phone]

## 2013-09-17 NOTE — Progress Notes (Signed)
[id number]/Rhonda Davis, RN, BSN, CCM [phone] Chart Reviewed for discharge and hospital needs. Discharge needs at time of review:  None present will follow for needs. Review of patient progress due on [id number]. 

## 2013-09-17 NOTE — Evaluation (Signed)
Physical Therapy Evaluation Patient Details Name: [name] MRN: [id number] DOB: [birth year] Today's Date: 09/17/2013 Time: [id number] PT Time Calculation (min): 15 min  PT Assessment / Plan / Recommendation History of Present Illness  [age] F with GOLD C COPD presenting with respiratory failure in setting of fall., required ventilator/extubated 09/16/12. pt is from SNF.  Clinical Impression  Pt tolerated mobilizing to recliner. Pt will benefit from PT to address problems. Plans to return to SNF.    PT Assessment  Patient needs continued PT services    Follow Up Recommendations  SNF;Supervision/Assistance - 24 hour    Does the patient have the potential to tolerate intense rehabilitation      Barriers to Discharge        Equipment Recommendations  None recommended by PT    Recommendations for Other Services     Frequency Min 2X/week    Precautions / Restrictions Precautions Precautions: Fall Precaution Comments: monitor   Pertinent Vitals/Pain Pt on 2 l. Sats >93%      Mobility  Bed Mobility Bed Mobility: Supine to Sit Supine to Sit: 5: Supervision;With rails;HOB elevated Details for Bed Mobility Assistance: pt quite animated and required no assistance. Transfers Transfers: Sit to Stand;Stand to Dollar GeneralSit;Stand Pivot Transfers Sit to Stand: 3: Mod assist;From bed;With upper extremity assist Stand to Sit: 4: Min assist;To chair/3-in-1;With upper extremity assist Stand Pivot Transfers: 3: Mod assist Details for Transfer Assistance: extra time to satnd and then take several steps to recliner.    Exercises     PT Diagnosis: Generalized weakness  PT Problem List: Decreased strength;Decreased activity tolerance;Decreased mobility;Cardiopulmonary status limiting activity PT Treatment Interventions: Functional mobility training;Therapeutic activities;Therapeutic exercise;Patient/family education     PT Goals(Current goals can be found in the care plan section) Acute Rehab PT  Goals Patient Stated Goal: I want to get up. to walk. PT Goal Formulation: With patient Time For Goal Achievement: 10/01/13 Potential to Achieve Goals: Good  Visit Information  Last PT Received On: 09/17/13 Assistance Needed: +2 History of Present Illness: [age] F with GOLD C COPD presenting with respiratory failure in setting of fall., required ventilator/extubated 09/16/12. pt is from SNF.       Prior Functioning  Home Living Family/patient expects to be discharged to:: Skilled nursing facility Prior Function Level of Independence: Needs assistance Gait / Transfers Assistance Needed: pt reports she gets around in a WC.    Cognition  Cognition Arousal/Alertness: Awake/alert Behavior During Therapy: WFL for tasks assessed/performed Overall Cognitive Status: Within Functional Limits for tasks assessed    Extremity/Trunk Assessment Upper Extremity Assessment Upper Extremity Assessment: Generalized weakness Lower Extremity Assessment Lower Extremity Assessment: Generalized weakness Cervical / Trunk Assessment Cervical / Trunk Assessment: Normal   Balance    End of Session PT - End of Session Activity Tolerance: Patient tolerated treatment well Patient left: in chair;with call bell/phone within reach Nurse Communication: Mobility status  GP     Rada HayHill, Fahmida [name] 09/17/2013, 12:03 PM Blanchard KelchKaren Genoveva Singleton PT [phone]

## 2013-09-18 LAB — CBC
HCT: 28.5 % — ABNORMAL LOW (ref 36.0–46.0)
Hemoglobin: 8.9 g/dL — ABNORMAL LOW (ref 12.0–15.0)
MCH: 27.2 pg (ref 26.0–34.0)
MCHC: 31.2 g/dL (ref 30.0–36.0)
MCV: 87.2 fL (ref 78.0–100.0)
PLATELETS: 151 10*3/uL (ref 150–400)
RBC: 3.27 MIL/uL — ABNORMAL LOW (ref 3.87–5.11)
RDW: 15 % (ref 11.5–15.5)
WBC: 11.9 10*3/uL — ABNORMAL HIGH (ref 4.0–10.5)

## 2013-09-18 LAB — BASIC METABOLIC PANEL
BUN: 88 mg/dL — ABNORMAL HIGH (ref 6–23)
CALCIUM: 9.5 mg/dL (ref 8.4–10.5)
CO2: 31 mEq/L (ref 19–32)
Chloride: 104 mEq/L (ref 96–112)
Creatinine, Ser: 2.57 mg/dL — ABNORMAL HIGH (ref 0.50–1.10)
GFR, EST AFRICAN AMERICAN: 19 mL/min — AB (ref >90)
GFR, EST NON AFRICAN AMERICAN: 16 mL/min — AB (ref >90)
Glucose, Bld: 169 mg/dL — ABNORMAL HIGH (ref 70–99)
POTASSIUM: 4.2 meq/L (ref 3.7–5.3)
SODIUM: 146 meq/L (ref 137–147)

## 2013-09-18 LAB — CULTURE, RESPIRATORY W GRAM STAIN

## 2013-09-18 LAB — GLUCOSE, CAPILLARY
GLUCOSE-CAPILLARY: 145 mg/dL — AB (ref 70–99)
GLUCOSE-CAPILLARY: 121 mg/dL — AB (ref 70–99)
GLUCOSE-CAPILLARY: 132 mg/dL — AB (ref 70–99)
Glucose-Capillary: 170 mg/dL — ABNORMAL HIGH (ref 70–99)

## 2013-09-18 LAB — CULTURE, RESPIRATORY

## 2013-09-18 MED ORDER — PREDNISONE 20 MG PO TABS
40.0000 mg | ORAL_TABLET | Freq: Every day | ORAL | Status: DC
  Administered 2013-09-19: 40 mg via ORAL
  Filled 2013-09-18 (×2): qty 2

## 2013-09-18 NOTE — Progress Notes (Signed)
CSW continuing to follow for disposition planning.  [facility] and [facility] when medically stable for discharge.  [facility] and anticipation of discharge as MD note reflects that pt will likely be medically ready for discharge in the next 24 to 48 hours.  CSW to continue to follow and facilitate pt discharge needs [facility] when pt medically ready for discharge.  Jacklynn LewisSuzanna Byrd, MSW, LCSW Clinical Social Work [phone]

## 2013-09-18 NOTE — Progress Notes (Signed)
PULMONARY  / CRITICAL CARE MEDICINE HISTORY AND PHYSICAL EXAMINATION  Name: [name] MRN: [id number] DOB: May 31, 1928    ADMISSION DATE:  09/14/2013  CHIEF COMPLAINT:  Fall  BRIEF PATIENT DESCRIPTION: 71 F with GOLD C COPD presenting with respiratory failure in setting of fall.   SIGNIFICANT EVENTS / STUDIES:  STUDIES:  1/2  CT Head / c-spine >>> no acute process 1/3  Doppler legs  >>> no DVT 1/4  Renal US  >>> no hydronephrosis, cortical thinning, cysts  LINES / TUBES: ETT 1/2 >> 1/4  CULTURES: 1/2  Blood >>> 1/2  Urine >>> neg 1/2  Respiratory >>> neg 1/2  Flu A >>> POSITIVE  ANTIBIOTICS: Vanc 1/2 >>> 1/3 Aztreonam 1/2 >>> 1/3  Azithro 1/2 >>> 1/3 Tamiflu 1/2 >>>1/7 Levaquin 1/3 >>>  SUBJECTIVE:  Feels better, up to chair. Wants to go home  PHYSICAL EXAM  VITAL SIGNS: Temp:  [97.8 F (36.6 C)-98.6 F (37 C)] 98.2 F (36.8 C) (01/06 0540) Pulse Rate:  [64-81] 68 (01/06 0540) Resp:  [14-24] 18 (01/06 0540) BP: (134-169)/(58-90) 143/58 mmHg (01/06 0540) SpO2:  [96 %-100 %] 99 % (01/06 0540) VENTILATOR SETTINGS:   INTAKE / OUTPUT: Intake/Output     01/05 0701 - 01/06 0700 01/06 0701 - 01/07 0700   P.O. 220 480   I.V. (mL/kg)     Other 160    NG/GT     IV Piggyback 150    Total Intake(mL/kg) 530 (7.2) 480 (6.5)   Urine (mL/kg/hr) 400 (0.2)    Total Output 400     Net +130 +480        Urine Occurrence 8 x 2 x   Stool Occurrence       PHYSICAL EXAMINATION: General: no distress Neuro: follows commands, moves extremities HEENT:  Ecchymosis around Rt eye Cardiovascular: regular, no murmur Lungs: decreased breath sounds, soft exp wheeze Abdomen: soft, non tender, reducible hernia Musculoskeletal: no edema Skin: ecchymosis on Rt side  CBC  Recent Labs Lab 09/16/13 0346 09/17/13 0422 09/18/13 0456  WBC 8.5 11.0* 11.9*  HGB 8.1* 8.3* 8.9*  HCT 25.0* 26.2* 28.5*  PLT 131* 134* 151   Coag's  Recent Labs Lab 09/14/13 1525  INR 1.22    BMET  Recent Labs Lab 09/16/13 0346 09/17/13 0422 09/18/13 0456  NA 138 145 146  K 4.1 4.4 4.2  CL 96 102 104  CO2 26 29 31   BUN 97* 97* 88*  CREATININE 4.06* 3.51* 2.57*  GLUCOSE 175* 167* 169*   Electrolytes  Recent Labs Lab 09/14/13 2250  09/16/13 0346 09/17/13 0422 09/18/13 0456  CALCIUM 8.8  < > 7.9* 8.7 9.5  MG 1.9  --   --   --   --   < > = values in this interval not displayed. Sepsis Markers  Recent Labs Lab 09/15/13 0050  LATICACIDVEN 1.0   ABG  Recent Labs Lab 09/14/13 1518 09/14/13 2117 09/15/13 0055  PHART 7.232* 7.137* 7.264*  PCO2ART 70.7* 88.7* 59.1*  PO2ART 112.0* 79.3* 118.0*   Liver Enzymes  Recent Labs Lab 09/14/13 1525  AST 57*  ALT 25  ALKPHOS 62  BILITOT 0.3  ALBUMIN 3.2*   Cardiac Enzymes  Recent Labs Lab 09/14/13 1525 09/15/13 0050 09/15/13 0545 09/15/13 1210  TROPONINI  --  <0.30 <0.30 <0.30  PROBNP 8732.0*  --   --   --    Glucose  Recent Labs Lab 09/17/13 0804 09/17/13 1228 09/17/13 1653 09/17/13 2113 09/18/13 0741 09/18/13  1126  GLUCAP 144* 161* 155* 153* 145* 121*   CXR:  None today  ASSESSMENT / PLAN:  PULMONARY A: Acute respiratory failure. AE COPD. P: Goal SpO2>92 Supplemental oxygen PRN Brovana, Pulmicort, Spiriva Albuterol PRN Prednisone 40  CARDIOVASCULAR A: Acute on chronic diastolic heart failure. HTN, CAD, hyperlipidemia. P:  ASA, Lipitor  RENAL A: Acute kidney injury - improving. Stage III CKD. P:  Trend BMP  GASTROINTESTINAL A: Nutrition. GI PX is not indicated. P: Diet D/c Protonix  HEMATOLOGIC A: Anemia of chronic disease. VTE Px. P:   Follow CBC SCD  INFECTIOUS A: Influenza A P: Tamiflu x 5 days Empirical Levaquin in setting of COPD exacerbation  ENDOCRINE A: DM type II with steroid induced hyperglycemia. P: SSI  NEUROLOGIC A: Acute encephalopathy - improved. Dementia Deconditioning. P: PT  Brett CanalesSteve Minor ACNP [facility]  PCCM Pager [phone] till 3 pm If no answer page [phone] 09/18/2013, 12:16 PM  I have personally obtained history, examined patient, evaluated and interpreted laboratory and imaging results, reviewed medical records, formulated assessment / plan and placed orders.  Lonia FarberZUBELEVITSKIY, Arash Karstens, MD Pulmonary and Critical Care Medicine [facility] Pager: [phone]  09/18/2013, 4:01 PM

## 2013-09-19 DIAGNOSIS — I509 Heart failure, unspecified: Secondary | ICD-10-CM

## 2013-09-19 DIAGNOSIS — J111 Influenza due to unidentified influenza virus with other respiratory manifestations: Secondary | ICD-10-CM

## 2013-09-19 LAB — BASIC METABOLIC PANEL
BUN: 78 mg/dL — AB (ref 6–23)
CHLORIDE: 103 meq/L (ref 96–112)
CO2: 30 mEq/L (ref 19–32)
Calcium: 9.6 mg/dL (ref 8.4–10.5)
Creatinine, Ser: 2.18 mg/dL — ABNORMAL HIGH (ref 0.50–1.10)
GFR calc Af Amer: 23 mL/min — ABNORMAL LOW (ref >90)
GFR calc non Af Amer: 20 mL/min — ABNORMAL LOW (ref >90)
GLUCOSE: 170 mg/dL — AB (ref 70–99)
Potassium: 5 mEq/L (ref 3.7–5.3)
Sodium: 144 mEq/L (ref 137–147)

## 2013-09-19 LAB — GLUCOSE, CAPILLARY
GLUCOSE-CAPILLARY: 135 mg/dL — AB (ref 70–99)
GLUCOSE-CAPILLARY: 153 mg/dL — AB (ref 70–99)

## 2013-09-19 MED ORDER — PREDNISONE 20 MG PO TABS: ORAL_TABLET | ORAL | Status: DC

## 2013-09-19 MED ORDER — INSULIN ASPART 100 UNIT/ML ~~LOC~~ SOLN: 0.0000 [IU] | Freq: Three times a day (TID) | SUBCUTANEOUS | Status: AC

## 2013-09-19 NOTE — Progress Notes (Addendum)
Pt for [facility].  CSW facilitated pt discharge needs including contacting facility, faxing pt discharge information via [facility], discussed with pt at bedside and pt brother via telephone, provided RN phone number to call report, and arranged [facility]. (Service Request ID#: [id number]).  No further social work needs identified at this time.  CSW signing off.   Jacklynn LewisSuzanna Byrd, MSW, LCSW Clinical Social Work [phone]

## 2013-09-19 NOTE — Progress Notes (Deleted)
Physical Therapy Treatment Patient Details Name: [name] MRN: [id number] DOB: [birth year] Today's Date: 09/19/2013 Time: [id number] PT Time Calculation (min): 23 min  PT Assessment / Plan / Recommendation  History of Present Illness [age] F with GOLD C COPD presenting with respiratory failure in setting of fall., required ventilator/extubated 09/16/12. pt is from SNF.   PT Comments   progressing  Follow Up Recommendations  SNF;Supervision/Assistance - 24 hour     Does the patient have the potential to tolerate intense rehabilitation     Barriers to Discharge        Equipment Recommendations  None recommended by PT    Recommendations for Other Services    Frequency Min 2X/week   Progress towards PT Goals Progress towards PT goals: Progressing toward goals  Plan Current plan remains appropriate    Precautions / Restrictions Precautions Precautions: Fall Precaution Comments: monitor sats Restrictions Weight Bearing Restrictions: No   Pertinent Vitals/Pain     Mobility  Ambulation/Gait Ambulation Distance (Feet): 50 Feet    Exercises General Exercises - Lower Extremity Long Arc Quad: AROM;10 reps;Seated;Both   PT Diagnosis:    PT Problem List:   PT Treatment Interventions:     PT Goals (current goals can now be found in the care plan section) Acute Rehab PT Goals Patient Stated Goal: I want to get up. to walk. Time For Goal Achievement: 10/01/13 Potential to Achieve Goals: Good  Visit Information  Last PT Received On: 09/19/13 Assistance Needed: +1 History of Present Illness: [age] F with GOLD C COPD presenting with respiratory failure in setting of fall., required ventilator/extubated 09/16/12. pt is from SNF.    Subjective Data  Patient Stated Goal: I want to get up. to walk.   Cognition  Cognition Arousal/Alertness: Awake/alert Behavior During Therapy: WFL for tasks assessed/performed Overall Cognitive Status:  (pt repeats self )    Balance     End of  Session PT - End of Session Equipment Utilized During Treatment: Oxygen Activity Tolerance: Patient tolerated treatment well Patient left: in chair;with call bell/phone within reach Nurse Communication: Mobility status   GP     [facility] Center[facility]ILLIAMS,[name] Howden 09/19/2013, 9:58 AM

## 2013-09-19 NOTE — Progress Notes (Signed)
ANTIBIOTIC CONSULT NOTE - Follow-up Pharmacy Consult for Levofloxacin Indication: AECOPD  Allergies  Allergen Reactions  . Codeine     REACTION: unspecified  . Doxycycline   . Guaifenesin & Derivatives   . Penicillins     REACTION: unspecified    Patient Measurements: Height: 4\' 11"  (149.9 cm) Weight: 157 lb 8 oz (71.442 kg) IBW/kg (Calculated) : 43.2  Vital Signs: Temp: 98.4 F (36.9 C) (01/07 0631) Temp src: Oral (01/07 0631) BP: 169/62 mmHg (01/07 0631) Pulse Rate: 75 (01/07 0631) Intake/Output from previous day: 01/06 0701 - 01/07 0700 In: 1200 [P.O.:1200] Out: -   Labs:  Recent Labs  09/17/13 0422 09/18/13 0456 09/19/13 0505  WBC 11.0* 11.9*  --   HGB 8.3* 8.9*  --   PLT 134* 151  --   CREATININE 3.51* 2.57* 2.18*   Estimated Creatinine Clearance: 16.8 ml/min (by C-G formula based on Cr of 2.18).   Assessment: [age] yr female with h/o COPD s/p fall and in respiratory failure admitted 09/14/13. Patient now extubated and on a regular floor. Has completed full treatment for influenza and currently on D5 levofloxacin for acute exacerbation of COPD.  Antiinfectives 1/3 Vanc 1gm IV x 1  1/3 Aztreonam 500mg  IV x 1  1/3 Azith 500mg  IV x 1  1/3 >>Tamiflu x 5 days >>1/7 1/3 >> Levaquin >>  Microbiology 1/3 blood x 2 >> NGTD 1/3 urine >> NGF 1/3 respiratory virus panel >> POS for influenza A and A H3 1/4 sputum >> non-pathogenic oropharyngeal-type flora- FINAL  Tmax: afebrile WBCs: 11.9, trending back up Renal: Scr 2.18, CrCl 17 ml/min (CG)  Goal of Therapy:  Eradication of infection, appropriate renal dosing of levofloxacin  Plan:   Continue levofloxacin 500mg  PO q24h per CCM  F/u antibiotic length of therapy and renal function  Thank you for the consult.  Tomi BambergerJesse Brigett Estell, PharmD, BCPS Clinical Pharmacist Pager: [phone] Pharmacy: [phone] 09/19/2013 12:46 PM

## 2013-09-19 NOTE — Discharge Summary (Signed)
Physician Discharge Summary  Patient ID: [name] MRN: [id number] DOB/AGE: October 09, 1927 80 y.o.  Admit date: 09/14/2013 Discharge date: 09/19/2013  Problem List Principal Problem:   Acute respiratory failure Active Problems:   DM (diabetes mellitus), type 2 with renal complications   COPD exacerbation   Encephalopathy, metabolic   Facial laceration   Acute on chronic kidney disease, stage 4   (HFpEF) heart failure with preserved ejection fraction   Influenza A  HPI: [name] is an 80 yo F with multiple medical issues some of the highlights of which include COPD (Gold C per Dr. Delford Field), HFpEF (last echo 2009), CAD who [facility] following a fall. The patient is currently unable to provide any history and the following is obtained from chart review. She told the ED physician that she felt weak when standing and fell, striking her head. She has recently been treated with Avelox and Tamiflu for a presumed COPD exacerbation. I was called to see her for worsening AMS. She required urgent intubation on my arrival. Both her son, [name] [phone]), and her brother, [name] [phone]), indicated that she would want intubation.  Hospital Course:  STUDIES:  1/2 CT Head / c-spine >>> no acute process  1/3 Doppler legs >>> no DVT  1/4 Renal US >>> no hydronephrosis, cortical thinning, cysts  LINES / TUBES:  ETT 1/2 >> 1/4  CULTURES:  1/2 Blood >>> pending at time of discharge. 1/2 Urine >>> neg  1/2 Respiratory >>> neg  1/2 Flu A >>> POSITIVE  ANTIBIOTICS:  Vanc 1/2 >>> 1/3  Aztreonam 1/2 >>> 1/3  Azithro 1/2 >>> 1/3  Tamiflu 1/2 >>>1/7  Levaquin 1/3 >>> 1/7   ASSESSMENT / PLAN:  PULMONARY  A:  Acute respiratory failure.  AE COPD.  P:  Goal SpO2>92  Supplemental oxygen PRN  Brovana, Pulmicort, Spiriva  Albuterol PRN  Prednisone 40  CARDIOVASCULAR  A:  Acute on chronic diastolic heart failure.  HTN, CAD, hyperlipidemia.  P:  ASA, Lipitor   Resume Imdur and tenormin on discharge but hold Demadex until creatine improves. RENAL  Lab Results  Component Value Date   CREATININE 2.18* 09/19/2013   CREATININE 2.57* 09/18/2013   CREATININE 3.51* 09/17/2013    A:  Acute kidney injury - improving.  Stage III CKD.  P:  Trend BMP  Hold diuretics GASTROINTESTINAL  A:  Nutrition.  GI PX is not indicated.  P:  Diet  D/c Protonix  HEMATOLOGIC  A:  Anemia of chronic disease.  VTE Px.  P:  Follow CBC  SCD  INFECTIOUS  A:  Influenza A  P:  Tamiflu x 5 days  Empirical Levaquin in setting of COPD exacerbation  ENDOCRINE  A:  DM type II with steroid induced hyperglycemia.  P:  SSI  NEUROLOGIC  A:  Acute encephalopathy - improved.  Dementia  Deconditioning.  P:  PT       Labs at discharge Lab Results  Component Value Date   CREATININE 2.18* 09/19/2013   BUN 78* 09/19/2013   NA 144 09/19/2013   K 5.0 09/19/2013   CL 103 09/19/2013   CO2 30 09/19/2013   Lab Results  Component Value Date   WBC 11.9* 09/18/2013   HGB 8.9* 09/18/2013   HCT 28.5* 09/18/2013   MCV 87.2 09/18/2013   PLT 151 09/18/2013   Lab Results  Component Value Date   ALT 25 09/14/2013   AST 57* 09/14/2013   ALKPHOS 62 09/14/2013  BILITOT 0.3 09/14/2013   Lab Results  Component Value Date   INR 1.22 09/14/2013   INR 1.1* 04/27/2011   INR 0.98 07/09/2009    Current radiology studies No results found.  Disposition:  03-Skilled Nursing Facility      Discharge Orders   Future Appointments Provider Department Dept Phone   10/05/2013 11:00 AM [name] FriskPatrick E Wright, MD [facility] [phone]   10/12/2013 11:45 AM [name] SalviaSteven C Klein, MD [facility] [phone]   Future Orders Complete By Expires   Discharge to SNF when bed available  As directed        Medication List    STOP taking these medications       aztreonam 1 G injection  Commonly known as:  AZACTAM     isosorbide mononitrate 30 MG 24 hr tablet  Commonly known as:   IMDUR     moxifloxacin 400 MG tablet  Commonly known as:  AVELOX     oseltamivir 75 MG capsule  Commonly known as:  TAMIFLU     PROBIOTIC DAILY PO     torsemide 20 MG tablet  Commonly known as:  DEMADEX      TAKE these medications       albuterol (2.5 MG/3ML) 0.083% nebulizer solution  Commonly known as:  PROVENTIL  Take 2.5 mg by nebulization 4 (four) times daily. And every 4 hours as needed for wheezing     allopurinol 100 MG tablet  Commonly known as:  ZYLOPRIM  Take 100 mg by mouth 2 (two) times daily.     aspirin 81 MG tablet  Take 81 mg by mouth daily.     atenolol 50 MG tablet  Commonly known as:  TENORMIN  Take 50 mg by mouth daily.     budesonide 0.25 MG/2ML nebulizer solution  Commonly known as:  PULMICORT  Take 0.25 mg by nebulization 4 (four) times daily.     chlorpheniramine-HYDROcodone 10-8 MG/5ML Lqcr  Commonly known as:  TUSSIONEX  Take 5 mLs by mouth at bedtime as needed.     dextromethorphan 30 MG/5ML liquid  Commonly known as:  DELSYM  Take 60 mg by mouth every 12 (twelve) hours as needed.     epoetin alfa 3875610000 UNIT/ML injection  Commonly known as:  EPOGEN,PROCRIT  Inject 10,000 Units into the skin once a week.     ferrous sulfate 325 (65 FE) MG tablet  Take 325 mg by mouth 2 (two) times daily.     insulin aspart 100 UNIT/ML injection  Commonly known as:  novoLOG  Inject 0-20 Units into the skin 3 (three) times daily with meals.     LANTUS 100 UNIT/ML injection  Generic drug:  insulin glargine  Inject 12 Units into the skin at bedtime.     lidocaine 5 %  Commonly known as:  LIDODERM  Place 1 patch onto the skin daily. Remove & Discard patch within 12 hours or as directed by MD     LOVAZA 1 G capsule  Generic drug:  omega-3 acid ethyl esters  Take 1 tablet by mouth daily.     omeprazole 20 MG capsule  Commonly known as:  PRILOSEC  Take 20 mg by mouth daily.     phenazopyridine 100 MG tablet  Commonly known as:  PYRIDIUM  Take  100 mg by mouth every 6 (six) hours as needed (dysuria).     polyethylene glycol packet  Commonly known as:  MIRALAX / GLYCOLAX  Take 17  g by mouth every 3 (three) days.     predniSONE 20 MG tablet  Commonly known as:  DELTASONE  Take 4 tabs  daily with food x 4 days, then 3 tabs daily x 4 days, then 2 tabs daily x 4 days, then 1 tab daily x4 days then 1/2 tablet daily thereafter     rosuvastatin 20 MG tablet  Commonly known as:  CRESTOR  Take 20 mg by mouth daily.     senna 8.6 MG tablet  Commonly known as:  SENOKOT  Take 1 tablet by mouth daily.     tiotropium 18 MCG inhalation capsule  Commonly known as:  SPIRIVA  Place 18 mcg into inhaler and inhale daily.     Vitamin D3 50000 UNITS Caps  Take 1 tablet by mouth daily.          Discharged Condition: fair  Time spent on discharge greater than 40 minutes.  Vital signs at Discharge. Temp:  [97.3 F (36.3 C)-98.4 F (36.9 C)] 98.4 F (36.9 C) (01/07 0631) Pulse Rate:  [63-75] 75 (01/07 0631) Resp:  [18-20] 20 (01/07 0631) BP: (111-169)/(57-85) 169/62 mmHg (01/07 0631) SpO2:  [97 %-100 %] 100 % (01/07 0832) Weight:  [157 lb 8 oz (71.442 kg)] 157 lb 8 oz (71.442 kg) (01/07 0220) Office follow up Special Information or instructions. She has completed all antibiotics and antivirals on day of discharge. She will return to her SNF.  We will resume her atenolol and Imdur but hold Demadex until seen by Dr. Delford Field in follow up. Signed: Brett Canales Minor ACNP [facility] PCCM Pager [phone] till 3 pm If no answer page [phone] 09/19/2013, 12:55 PM     STaff note  Post flu acute aecopd and hospitlization. Now deconditioned but better. Mentally intact. Going to [facility] 09/19/2013   > 35 min dc planning  Dr. Kalman Shan, M.D., [facility].C.P Pulmonary and Critical Care Medicine Staff Physician [facility] and Critical Care Pager: [phone], If no answer or between  15:00h - 7:00h: call [phone]  09/19/2013 2:57 PM

## 2013-09-19 NOTE — Progress Notes (Signed)
09/19/13 0900  PT Visit Information  Last PT Received On 09/19/13  Assistance Needed +1  History of Present Illness [age] F with GOLD C COPD presenting with respiratory failure in setting of fall., required ventilator/extubated 09/16/12. pt is from SNF.  PT Time Calculation  PT Start Time [phone]  PT Stop Time [phone]  PT Time Calculation (min) 23 min  Subjective Data  Patient Stated Goal I want to get up. to walk.  Precautions  Precautions Fall  Precaution Comments monitor sats  Cognition  Arousal/Alertness Awake/alert  Behavior During Therapy WFL for tasks assessed/performed  Overall Cognitive Status (pt repeats self )  Bed Mobility  Overal bed mobility Needs Assistance  Bed Mobility Supine to Sit  Supine to sit Supervision  Transfers  Overall transfer level Needs assistance  Equipment used Rolling walker (2 wheeled)  Transfers Sit to/from Stand  Sit to Stand Supervision  General transfer comment cues for hand placement, prefers to pull on RW  Ambulation/Gait  Ambulation/Gait assistance Min assist  Ambulation Distance (Feet) 50 Feet  Assistive device Rolling walker (2 wheeled)  Gait Pattern/deviations Step-to pattern;Narrow base of support;Trunk flexed  General Comments  General comments (skin integrity, edema, etc.) pt with 2-3/4 DOE, chair to pt afteramb  sats 100%, HR 82  General Exercises - Lower Extremity  Long Arc Quad AROM;10 reps;Seated;Both  PT - End of Session  Equipment Utilized During Treatment Oxygen  Activity Tolerance Patient tolerated treatment well  Patient left in chair;with call bell/phone within reach  Nurse Communication Mobility status  PT - Assessment/Plan  PT Plan Current plan remains appropriate  PT Frequency Min 2X/week  Follow Up Recommendations SNF;Supervision/Assistance - 24 hour  PT equipment None recommended by PT  PT Goal Progression  Progress towards PT goals Progressing toward goals  Acute Rehab PT Goals  Time For Goal Achievement 10/01/13   Potential to Achieve Goals Good  PT General Charges  $$ ACUTE PT VISIT 1 Procedure  PT Treatments  $Gait Training 23-37 mins

## 2013-09-19 NOTE — Progress Notes (Signed)
[facility]. Patient in good spirit. Stable- Hulda Marinonna Doroteo Nickolson RN

## 2013-09-19 NOTE — Progress Notes (Signed)
Report given to Ferd GlassingSimethia Maccray LPN at [facility]. Patient is stable, denies pain.Hulda Marin- Nomi Rudnicki RN

## 2013-09-20 ENCOUNTER — Non-Acute Institutional Stay (SKILLED_NURSING_FACILITY): Payer: PRIVATE HEALTH INSURANCE | Admitting: Internal Medicine

## 2013-09-20 DIAGNOSIS — I509 Heart failure, unspecified: Secondary | ICD-10-CM

## 2013-09-20 DIAGNOSIS — N058 Unspecified nephritic syndrome with other morphologic changes: Secondary | ICD-10-CM

## 2013-09-20 DIAGNOSIS — J4489 Other specified chronic obstructive pulmonary disease: Secondary | ICD-10-CM

## 2013-09-20 DIAGNOSIS — N184 Chronic kidney disease, stage 4 (severe): Secondary | ICD-10-CM

## 2013-09-20 DIAGNOSIS — I5030 Unspecified diastolic (congestive) heart failure: Secondary | ICD-10-CM

## 2013-09-20 DIAGNOSIS — E1129 Type 2 diabetes mellitus with other diabetic kidney complication: Secondary | ICD-10-CM

## 2013-09-20 DIAGNOSIS — J449 Chronic obstructive pulmonary disease, unspecified: Secondary | ICD-10-CM

## 2013-09-21 LAB — CULTURE, BLOOD (ROUTINE X 2)
CULTURE: NO GROWTH
Culture: NO GROWTH

## 2013-09-22 NOTE — Progress Notes (Signed)
HISTORY & PHYSICAL  DATE: 09/20/2013   FACILITY: [facility]  LEVEL OF CARE: SNF (31)  ALLERGIES:  Allergies  Allergen Reactions  . Codeine     REACTION: unspecified  . Doxycycline   . Guaifenesin & Derivatives   . Penicillins     REACTION: unspecified    CHIEF COMPLAINT: Manage COPD, diabetes mellitus and CHF  HISTORY OF PRESENT ILLNESS: Patient is an 80 year old Caucasian female who was hospitalized for hypoxemia and worsening mental status. After hospitalization she is readmitted to this facility for long-term care management.  COPD: the COPD remains stable.  Pt denies sob, cough, wheezing or declining exercise tolerance.  No complications from the medications presently being used.  DM:pt's DM remains stable.  Pt denies polyuria, polydipsia, polyphagia, changes in vision or hypoglycemic episodes.  No complications noted from the medication presently being used.  Last hemoglobin A1c is: Not available.  CHF:The patient does not relate significant weight changes, denies sob, DOE, orthopnea, PNDs, palpitations or chest pain.  CHF remains stable.  No complications form the medications being used. Complains of chronic lower extremity swelling.  PAST MEDICAL HISTORY :  Past Medical History  Diagnosis Date  . COPD (chronic obstructive pulmonary disease)   . Diastolic congestive heart failure   . C. difficile colitis   . Hypertension   . Hyperlipidemia   . CAD (coronary artery disease)   . Anemia   . Type 2 diabetes mellitus   . Pacemaker 2002  . Cardiac arrest - asystole 2002  . Gallstones   . Pulmonary hypertension   . Renal insufficiency   . Osteoarthritis   . Allergic rhinitis   . Gout   . Stasis dermatitis   . Sinoatrial node dysfunction   . Syncope   . Postsurgical percutaneous transluminal coronary angioplasty status   . Dyslipidemia   . Acute bronchitis   . History of cholelithiasis   . Obesity     PAST SURGICAL HISTORY: Past  Surgical History  Procedure Laterality Date  . Ptca  2002  . Appendectomy    . Vesicovaginal fistula closure w/ tah    . Pacemaker placement  2002    SOCIAL HISTORY:  reports that she quit smoking about 28 years ago. Her smoking use included Cigarettes. She has a 50 pack-year smoking history. She has never used smokeless tobacco. She reports that she does not drink alcohol.  FAMILY HISTORY:  Family History  Problem Relation Age of Onset  . Diabetes Mother   . Hypertension Mother   . Heart disease Father     [facility]  REVIEW OF SYSTEMS:  See HPI otherwise 14 point ROS is negative.  PHYSICAL EXAMINATION  VS:  T 97.9        P 80      RR 20       BP 120/70      POX% 100        WT (Lb) 157  GENERAL: no acute distress, normal body habitus EYES: conjunctivae normal, sclerae normal, normal eye lids MOUTH/THROAT: lips without lesions,no lesions in the mouth,tongue is without lesions,uvula elevates in midline NECK: supple, trachea midline, no neck masses, no thyroid tenderness, no thyromegaly LYMPHATICS: no LAN in the neck, no supraclavicular LAN RESPIRATORY: breathing is even & unlabored, BS CTAB CARDIAC: RRR, no murmur,no extra heart sounds, +3 bilateral lower extremity pitting edema GI:  ABDOMEN: abdomen soft, normal BS, no masses, no tenderness  LIVER/SPLEEN: no hepatomegaly, no splenomegaly MUSCULOSKELETAL: HEAD: normal to inspection & palpation BACK: no kyphosis, scoliosis or spinal processes tenderness EXTREMITIES: LEFT UPPER EXTREMITY: full range of motion, normal strength & tone RIGHT UPPER EXTREMITY:  full range of motion, normal strength & tone LEFT LOWER EXTREMITY:  Moderate range of motion, normal strength & tone RIGHT LOWER EXTREMITY:  Moderate range of motion, normal strength & tone PSYCHIATRIC: the patient is alert & oriented to person, affect & behavior appropriate  LABS/RADIOLOGY:  Labs reviewed: Basic Metabolic Panel:  Recent  Labs  09/14/13 2250  09/17/13 0422 09/18/13 0456 09/19/13 0505  NA 135*  < > 145 146 144  K 4.2  < > 4.4 4.2 5.0  CL 92*  < > 102 104 103  CO2 29  < > 29 31 30   GLUCOSE 144*  < > 167* 169* 170*  BUN 84*  < > 97* 88* 78*  CREATININE 3.84*  < > 3.51* 2.57* 2.18*  CALCIUM 8.8  < > 8.7 9.5 9.6  MG 1.9  --   --   --   --   < > = values in this interval not displayed. Liver Function Tests:  Recent Labs  09/14/13 1525  AST 57*  ALT 25  ALKPHOS 62  BILITOT 0.3  PROT 7.5  ALBUMIN 3.2*   CBC:  Recent Labs  09/16/13 0346 09/17/13 0422 09/18/13 0456  WBC 8.5 11.0* 11.9*  HGB 8.1* 8.3* 8.9*  HCT 25.0* 26.2* 28.5*  MCV 85.9 86.2 87.2  PLT 131* 134* 151   Cardiac Enzymes:  Recent Labs  09/15/13 0050 09/15/13 0545 09/15/13 1210  TROPONINI <0.30 <0.30 <0.30   CBG:  Recent Labs  09/18/13 2149 09/19/13 0741 09/19/13 1233  GLUCAP 132* 135* 153*   EXAM: CT HEAD WITHOUT CONTRAST   CT CERVICAL SPINE WITHOUT CONTRAST   TECHNIQUE: Multidetector CT imaging of the head and cervical spine was performed following the standard protocol without intravenous contrast. Multiplanar CT image reconstructions of the cervical spine were also generated.   COMPARISON:  01/20/2005 .   FINDINGS: CT HEAD FINDINGS   Images were obtained and reconstructed to approximate a normal head CT angulation. The patient's face inch scan are lower de against the chest, with the neck flexed.   Stable appearance of remote right frontal lobe infarct. Otherwise, the brainstem, cerebellum, cerebral peduncles, thalamus, basal ganglia, basilar cisterns, and ventricular system appear within normal limits. No intracranial hemorrhage, mass lesion, or acute CVA.   There is opacification of multiple ethmoid air cells as well as potential air-fluid level in the left sphenoid sinus. Despite efforts by the technologist and patient, motion artifact is present on today's exam and could not be  eliminated. This reduces exam sensitivity and specificity.   CT CERVICAL SPINE FINDINGS   The cervical spine is prominently flexed during imaging. There is motion artifact which reduces diagnostic sensitivity and specificity.   Prominent multilevel facet arthropathy noted. Suspected partial facet fusion on the right at C2-3.   There is 2.5 mm of anterior subluxation at the C3-4 level associated with prominent degenerative facet arthropathy. I do not observe a fracture.   There is considerable loss of intervertebral disc height at C5-6 and C6-7, and at C3-4. Posterior osseous ridging noted at C5-6   IMPRESSION: 1. Remote right frontal lobe infarct. No acute intracranial findings observed. 2. Acute on chronic paranasal sinusitis. 3. 2.5 mm of anterior subluxation at C3-4 is probably degenerative. No fracture observed. 4. Cervical spondylosis and  degenerative disc disease. 5. Despite efforts by the technologist and patient, motion artifact is present on today's exam and could not be eliminated. This reduces exam sensitivity and specificity. 6. The neck was markedly flexed during imaging. We performed multiplanar reformatting in order to approximate normal scan planes and facilitate comparison and pattern recognition.     EXAM: CT HEAD WITHOUT CONTRAST   CT CERVICAL SPINE WITHOUT CONTRAST   TECHNIQUE: Multidetector CT imaging of the head and cervical spine was performed following the standard protocol without intravenous contrast. Multiplanar CT image reconstructions of the cervical spine were also generated.   COMPARISON:  01/20/2005 .   FINDINGS: CT HEAD FINDINGS   Images were obtained and reconstructed to approximate a normal head CT angulation. The patient's face inch scan are lower de against the chest, with the neck flexed.   Stable appearance of remote right frontal lobe infarct. Otherwise, the brainstem, cerebellum, cerebral peduncles, thalamus,  basal ganglia, basilar cisterns, and ventricular system appear within normal limits. No intracranial hemorrhage, mass lesion, or acute CVA.   There is opacification of multiple ethmoid air cells as well as potential air-fluid level in the left sphenoid sinus. Despite efforts by the technologist and patient, motion artifact is present on today's exam and could not be eliminated. This reduces exam sensitivity and specificity.   CT CERVICAL SPINE FINDINGS   The cervical spine is prominently flexed during imaging. There is motion artifact which reduces diagnostic sensitivity and specificity.   Prominent multilevel facet arthropathy noted. Suspected partial facet fusion on the right at C2-3.   There is 2.5 mm of anterior subluxation at the C3-4 level associated with prominent degenerative facet arthropathy. I do not observe a fracture.   There is considerable loss of intervertebral disc height at C5-6 and C6-7, and at C3-4. Posterior osseous ridging noted at C5-6   IMPRESSION: 1. Remote right frontal lobe infarct. No acute intracranial findings observed. 2. Acute on chronic paranasal sinusitis. 3. 2.5 mm of anterior subluxation at C3-4 is probably degenerative. No fracture observed. 4. Cervical spondylosis and degenerative disc disease. 5. Despite efforts by the technologist and patient, motion artifact is present on today's exam and could not be eliminated. This reduces exam sensitivity and specificity. 6. The neck was markedly flexed during imaging. We performed multiplanar reformatting in order to approximate normal scan planes and facilitate comparison and pattern recognition.   EXAM: RENAL/URINARY TRACT ULTRASOUND COMPLETE   COMPARISON:  02/23/2007   FINDINGS: Right Kidney:   Length: 10.2 cm. There is cortical thinning and expansion of the central sinus complex. Increased cortical thinning compared to the prior examination. No evidence for right hydronephrosis.  Echogenic structure within the right kidney lower pole is nonspecific. There is a poorly defined hypoechoic structure along the right kidney upper pole that measures up to 4.9 cm and probably represents a large cyst.   Left Kidney:   Length: 10.4 cm. Diffuse cortical thinning in the left kidney. Negative for hydronephrosis. Hypoechoic structure in the lateral left kidney measures up to 1.6 cm and most compatible with a cyst.   Bladder:   Patient has a Foley catheter and this area was not imaged.   IMPRESSION: Cortical thinning in both kidneys is consistent with history of chronic kidney disease. No evidence for hydronephrosis.   Evidence for bilateral renal cysts.   Small echogenic structure along the right kidney lower pole is nonspecific but cannot exclude a nonobstructive stone.   EXAM: PORTABLE CHEST - 1 VIEW   COMPARISON:  09/16/2013   FINDINGS: Sequential pacemaker enters from the left with leads unchanged in position. Cardiomegaly.   Pulmonary vascular congestion most notable centrally.   Left base subsegmental atelectasis. No well-defined segmental consolidation separate from this region.   Biapical pleural thickening without associated bony destruction.   No gross pneumothorax.   Calcified mildly tortuous aorta.   Mild curvature of the spine.   IMPRESSION: Cardiomegaly with pacemaker in place.   Pulmonary vascular congestion most notable centrally.   Left base subsegmental atelectasis. No well-defined segmental consolidation separate from this region.   ASSESSMENT/PLAN:  COPD-well compensated Diabetes mellitus with renal complications-continue current medications CHF-well compensated Chronic kidney disease stage IV-stable Influenza A- treated [place]- vascular hypertension-well controlled Anemia of chronic kidney disease-continue epogen and iron Constipation-well-controlled  I have reviewed patient's medical records received at admission/from  hospitalization.  CPT CODE: 40981

## 2013-10-01 ENCOUNTER — Encounter: Payer: Self-pay | Admitting: *Deleted

## 2013-10-01 ENCOUNTER — Telehealth: Payer: Self-pay | Admitting: Critical Care Medicine

## 2013-10-01 NOTE — Telephone Encounter (Signed)
Letter has been drafted and has been given to the pt. Nothing further was needed.

## 2013-10-05 ENCOUNTER — Ambulatory Visit (INDEPENDENT_AMBULATORY_CARE_PROVIDER_SITE_OTHER): Payer: Medicare Other | Admitting: Critical Care Medicine

## 2013-10-05 ENCOUNTER — Encounter: Payer: Self-pay | Admitting: Critical Care Medicine

## 2013-10-05 VITALS — BP 138/66 | HR 80 | Temp 98.9°F

## 2013-10-05 DIAGNOSIS — J441 Chronic obstructive pulmonary disease with (acute) exacerbation: Secondary | ICD-10-CM

## 2013-10-05 DIAGNOSIS — I5030 Unspecified diastolic (congestive) heart failure: Secondary | ICD-10-CM

## 2013-10-05 DIAGNOSIS — I509 Heart failure, unspecified: Secondary | ICD-10-CM

## 2013-10-05 MED ORDER — TORSEMIDE 20 MG PO TABS: 40.0000 mg | ORAL_TABLET | Freq: Every day | ORAL | Status: AC

## 2013-10-05 MED ORDER — PREDNISONE 20 MG PO TABS: ORAL_TABLET | ORAL | Status: DC

## 2013-10-05 MED ORDER — ALBUTEROL SULFATE (2.5 MG/3ML) 0.083% IN NEBU: 2.5000 mg | INHALATION_SOLUTION | RESPIRATORY_TRACT | Status: AC

## 2013-10-05 NOTE — Progress Notes (Signed)
Subjective:    Patient ID: [name], female    DOB: 1928/03/19, 80 y.o.   MRN: [id number]  HPI  81 y.o.F quit smoking in 1987 and chronic renal insufficiency with coronary artery disease. The patient had history of congestive heart failure and diabetes. The patient has been hospitalized on numerous occasions for respiratory failure and pneumonia. Patient currently is residing in a nursing care facility.   02/28/2013  Pt with some indigestion. Occ cough . Dyspnea is the same Pt denies any significant sore throat, nasal congestion or excess secretions, fever, chills, sweats, unintended weight loss, pleurtic or exertional chest pain, orthopnea PND, or leg swelling Pt denies any increase in rescue therapy over baseline, denies waking up needing it or having any early am or nocturnal exacerbations of coughing/wheezing/or dyspnea. Pt also denies any obvious fluctuation in symptoms with  weather or environmental change or other alleviating or aggravating factors  08/01/2013 Chief Complaint  Patient presents with  . 4 month follow up    Breathing has worsened.  Having increased DOE, wheezing, chest heaviness, and prod cough with white to small amount of yellow mucus.  Ntoes more dyspnea, and wheezing and cough.  Notes some yellow mucus and white.  On pred 5mg  per day.  On spiriva and neb meds Pt denies any significant sore throat, nasal congestion or excess secretions, fever, chills, sweats, unintended weight loss, pleurtic or exertional chest pain, orthopnea PND, or leg swelling Pt denies any increase in rescue therapy over baseline, denies waking up needing it or having any early am or nocturnal exacerbations of coughing/wheezing/or dyspnea. Pt also denies any obvious fluctuation in symptoms with  weather or environmental change or other alleviating or aggravating factors   10/05/2013 Chief Complaint  Patient presents with  . Post Hospital Follow up    Breathing gets a little "heavy" when  its time for breathing treatment.  Once she has tx, breathing improves.  Cough with white mucus.  No chest tightness/pain.  Adm 09/14/13>>09/19/13  resp failure, copd exac, fallen, hit face, [facility]  Had already had tamiflu/avelox.  Intubated and vent. Confused.  CT head NEG.  Influenza A Pos.  extub 09/16/13.    Since d/c is still dyspneic at times.  Pt on q6H now neb. No real chst pain.  Mucus is now white, sl color yellow  SCr  3.5>>2.18 on d/c. Notes more edema and abd swollen   Objective:    Review of Systems  Constitutional:   No  weight loss, night sweats,  Fevers, chills, fatigue, lassitude. HEENT:   No headaches,  Difficulty swallowing,  Tooth/dental problems,  Sore throat,                No sneezing, itching, ear ache, nasal congestion, post nasal drip,   CV:  No chest pain,  Orthopnea, PND, swelling in lower extremities, anasarca, dizziness, palpitations  GI  No heartburn, indigestion, abdominal pain, nausea, vomiting, diarrhea, change in bowel habits, loss of appetite  Resp: Notes  shortness of breath with exertion not  at rest.  No excess mucus, no productive cough,  No non-productive cough,  No coughing up of blood.  No change in color of mucus.  No wheezing.  No chest wall deformity  Skin: no rash or lesions.  GU: no dysuria, change in color of urine, no urgency or frequency.  No flank pain.  MS:  No joint pain or swelling.  No decreased range of motion.  No back pain.  Psych:  No change in mood or affect. No depression or anxiety.  No memory loss.     Objective:   Physical Exam  Filed Vitals:   10/05/13 1120  BP: 138/66  Pulse: 80  Temp: 98.9 F (37.2 C)  TempSrc: Oral  SpO2: 99%    Gen: Pleasant, well-nourished, in no distress,  normal affect  ENT: No lesions,  mouth clear,  oropharynx clear, no postnasal drip  Neck: No JVD, no TMG, no carotid bruits  Lungs: No use of accessory muscles, no dullness to percussion, expired wheezes  Cardiovascular: RRR, heart  sounds normal, no murmur or gallops, no peripheral edema  Abdomen: soft and NT, no HSM,  BS normal  Musculoskeletal: No deformities, no cyanosis or clubbing  Neuro: alert, non focal  Skin: Warm, no lesions or rashes        Assessment & Plan:   (HFpEF) heart failure with preserved ejection fraction Volume excess off diuretic Plan Resume torsemide Hold potassium with hyperkalemia Overload Aldactone with previous hyperkalemia and renal dysfunction  COPD exacerbation Recent COPD exacerbation with hypoxemia do to influenza A Now improved No further antibiotics Slow steroid taper    Updated Medication List Outpatient Encounter Prescriptions as of 10/05/2013  Medication Sig  . albuterol (PROVENTIL) (2.5 MG/3ML) 0.083% nebulizer solution Take 3 mLs (2.5 mg total) by nebulization every 4 (four) hours while awake. And every 4 hours as needed for wheezing  . allopurinol (ZYLOPRIM) 100 MG tablet Take 100 mg by mouth 2 (two) times daily.    Marland Kitchen. aspirin 81 MG tablet Take 81 mg by mouth daily.    Marland Kitchen. atenolol (TENORMIN) 50 MG tablet Take 50 mg by mouth daily.   . budesonide (PULMICORT) 0.25 MG/2ML nebulizer solution Take 0.25 mg by nebulization 4 (four) times daily.   . chlorpheniramine-HYDROcodone (TUSSIONEX) 10-8 MG/5ML LQCR Take 5 mLs by mouth at bedtime as needed.  . Cholecalciferol (VITAMIN D3) 50000 UNITS CAPS Take 1 tablet by mouth daily.  Marland Kitchen. dextromethorphan (DELSYM) 30 MG/5ML liquid Take 60 mg by mouth every 12 (twelve) hours as needed.   Marland Kitchen. epoetin alfa (EPOGEN,PROCRIT) 1610910000 UNIT/ML injection Inject 10,000 Units into the skin once a week.    . ferrous sulfate 325 (65 FE) MG tablet Take 325 mg by mouth 2 (two) times daily.    . insulin aspart (NOVOLOG) 100 UNIT/ML injection Inject 0-20 Units into the skin 3 (three) times daily with meals.  . insulin glargine (LANTUS) 100 UNIT/ML injection Inject 12 Units into the skin at bedtime.   . lidocaine (LIDODERM) 5 % Place 1 patch onto the  skin daily. Remove & Discard patch within 12 hours or as directed by MD  . Riesa PopeLOVAZA 1 G capsule Take 1 tablet by mouth daily.  Marland Kitchen. omeprazole (PRILOSEC) 20 MG capsule Take 20 mg by mouth daily.    . phenazopyridine (PYRIDIUM) 100 MG tablet Take 100 mg by mouth every 6 (six) hours as needed (dysuria).  . polyethylene glycol (MIRALAX / GLYCOLAX) packet Take 17 g by mouth every 3 (three) days.  . predniSONE (DELTASONE) 20 MG tablet Take 4 tabs  daily with food x 4 days, then 3 tabs daily x 4 days, then 2 tabs daily x 4 days, then 1 tab daily x4 days then STOP  . rosuvastatin (CRESTOR) 20 MG tablet Take 20 mg by mouth daily.    Marland Kitchen. senna (SENOKOT) 8.6 MG tablet Take 1 tablet by mouth daily.  Marland Kitchen. tiotropium (SPIRIVA) 18 MCG inhalation capsule Place 18 mcg into  inhaler and inhale daily.    . [DISCONTINUED] albuterol (PROVENTIL) (2.5 MG/3ML) 0.083% nebulizer solution Take 2.5 mg by nebulization 4 (four) times daily. And every 4 hours as needed for wheezing  . [DISCONTINUED] predniSONE (DELTASONE) 20 MG tablet Take 4 tabs  daily with food x 4 days, then 3 tabs daily x 4 days, then 2 tabs daily x 4 days, then 1 tab daily x4 days then 1/2 tablet daily thereafter  . torsemide (DEMADEX) 20 MG tablet Take 2 tablets (40 mg total) by mouth daily.

## 2013-10-05 NOTE — Assessment & Plan Note (Signed)
Volume excess off diuretic Plan Resume torsemide Hold potassium with hyperkalemia Overload Aldactone with previous hyperkalemia and renal dysfunction

## 2013-10-05 NOTE — Assessment & Plan Note (Signed)
Recent COPD exacerbation with hypoxemia do to influenza A Now improved No further antibiotics Slow steroid taper

## 2013-10-05 NOTE — Patient Instructions (Signed)
Resume demadex 40mg  daily When you get to 10mg  per day of prednisone, stop after four days No other changes Return 4 weeks

## 2013-10-12 ENCOUNTER — Encounter: Payer: Medicare Other | Admitting: Internal Medicine

## 2013-10-29 ENCOUNTER — Ambulatory Visit: Payer: Medicare Other | Admitting: Cardiology

## 2013-11-07 ENCOUNTER — Encounter: Payer: Medicare Other | Admitting: Internal Medicine

## 2013-11-12 ENCOUNTER — Ambulatory Visit: Payer: Medicare Other | Admitting: Critical Care Medicine

## 2013-11-12 ENCOUNTER — Encounter: Payer: Self-pay | Admitting: Critical Care Medicine

## 2013-11-12 ENCOUNTER — Ambulatory Visit: Payer: Medicare Other | Admitting: Cardiology

## 2013-11-14 ENCOUNTER — Telehealth: Payer: Self-pay

## 2013-11-14 NOTE — Telephone Encounter (Signed)
Sp[facility] with Richie[facility]. I explained to him that this patient must make every effort to attend all scheduled appts.He stated the patient made her own decisions and has no POA listed. I advised him that no one would be allowed to cancel or make appts for her except the Nursing Facility in case of an emergency.

## 2013-11-15 ENCOUNTER — Telehealth: Payer: Self-pay | Admitting: Critical Care Medicine

## 2013-11-15 NOTE — Telephone Encounter (Signed)
Letter has been printed. This was drafted on 11/12/13. Nothing further is needed.

## 2013-11-15 NOTE — Telephone Encounter (Signed)
Waiting in lobby for letter.

## 2013-11-19 ENCOUNTER — Encounter: Payer: Self-pay | Admitting: Critical Care Medicine

## 2013-11-19 ENCOUNTER — Ambulatory Visit (INDEPENDENT_AMBULATORY_CARE_PROVIDER_SITE_OTHER): Payer: Medicare Other | Admitting: Critical Care Medicine

## 2013-11-19 VITALS — BP 130/80 | HR 61 | Temp 97.6°F | Ht 59.0 in | Wt 156.4 lb

## 2013-11-19 DIAGNOSIS — J441 Chronic obstructive pulmonary disease with (acute) exacerbation: Secondary | ICD-10-CM

## 2013-11-19 MED ORDER — LEVOFLOXACIN 500 MG PO TABS: 500.0000 mg | ORAL_TABLET | Freq: Every day | ORAL | Status: DC

## 2013-11-19 MED ORDER — METHYLPREDNISOLONE ACETATE 80 MG/ML IJ SUSP
120.0000 mg | Freq: Once | INTRAMUSCULAR | Status: AC
  Administered 2013-11-19: 120 mg via INTRAMUSCULAR

## 2013-11-19 NOTE — Patient Instructions (Signed)
A depomedrol 120mg  injection was given Take levaquin 500mg  daily for 7days No other medication changes Return 6 weeks

## 2013-11-19 NOTE — Progress Notes (Signed)
Subjective:    Patient ID: [name], female    DOB: [birth year], 80 y.o.   MRN: [id number]  HPI  80 y.o.F quit smoking in 1987 and chronic renal insufficiency with coronary artery disease. The patient had history of congestive heart failure and diabetes. The patient has been hospitalized on numerous occasions for respiratory failure and pneumonia. Patient currently is residing in a nursing care facility.     10/05/2013 Chief Complaint  Patient presents with  . Post Hospital Follow up    Breathing gets a little "heavy" when its time for breathing treatment.  Once she has tx, breathing improves.  Cough with white mucus.  No chest tightness/pain.  Adm 09/14/13>>09/19/13  resp failure, [facility], [facility]  Had already had tamiflu/avelox.  Intubated and vent. Confused.  CT head NEG.  Influenza A Pos.  extub 09/16/13.    Since d/c is still dyspneic at times.  Pt on q6H now neb. No real chst pain.  Mucus is now white, sl color yellow  SCr  3.5>>2.18 on d/c. Notes more edema and abd swollen  11/19/2013 Chief Complaint  Patient presents with  . Follow-up    pt c/o having increased SOB and productive cough with white phlegm.    Pt remains swollen in feet and abd.  Coughing more mucus white. No fever.  No real chest pain .       Objective:    Review of Systems  Constitutional:   No  weight loss, night sweats,  Fevers, chills, fatigue, lassitude. HEENT:   No headaches,  Difficulty swallowing,  Tooth/dental problems,  Sore throat,                No sneezing, itching, ear ache, nasal congestion, post nasal drip,   CV:  No chest pain,  Orthopnea, PND, swelling in lower extremities, anasarca, dizziness, palpitations  GI  No heartburn, indigestion, abdominal pain, nausea, vomiting, diarrhea, change in bowel habits, loss of appetite  Resp: Notes  shortness of breath with exertion not  at rest.  No excess mucus, no productive cough,  No non-productive cough,  No coughing up of  blood.  No change in color of mucus.  No wheezing.  No chest wall deformity  Skin: no rash or lesions.  GU: no dysuria, change in color of urine, no urgency or frequency.  No flank pain.  MS:  No joint pain or swelling.  No decreased range of motion.  No back pain.  Psych:  No change in mood or affect. No depression or anxiety.  No memory loss.     Objective:   Physical Exam  Filed Vitals:   11/19/13 1530  BP: 130/80  Pulse: 61  Temp: 97.6 F (36.4 C)  TempSrc: Oral  Height: 4\' 11"  (1.499 m)  Weight: 156 lb 6.4 oz (70.943 kg)  SpO2: 97%    Gen: Pleasant, well-nourished, in no distress,  normal affect  ENT: No lesions,  mouth clear,  oropharynx clear, no postnasal drip  Neck: No JVD, no TMG, no carotid bruits  Lungs: No use of accessory muscles, no dullness to percussion, expired wheezes  Cardiovascular: RRR, heart sounds normal, no murmur or gallops, 3+ peripheral edema  Abdomen: soft and NT, no HSM,  BS normal  Musculoskeletal: No deformities, no cyanosis or clubbing  Neuro: alert, non focal  Skin: Warm, no lesions or rashes        Assessment & Plan:   COPD exacerbation Recurrent exacerbation copd Plan  A depomedrol 120mg  injection was given Take levaquin 500mg  daily for 7days No other medication changes Return 6 weeks     Updated Medication List Outpatient Encounter Prescriptions as of 11/19/2013  Medication Sig  . albuterol (PROVENTIL) (2.5 MG/3ML) 0.083% nebulizer solution Take 3 mLs (2.5 mg total) by nebulization every 4 (four) hours while awake. And every 4 hours as needed for wheezing  . allopurinol (ZYLOPRIM) 100 MG tablet Take 100 mg by mouth 2 (two) times daily.    Marland Kitchen aspirin 81 MG tablet Take 81 mg by mouth daily.    Marland Kitchen atenolol (TENORMIN) 50 MG tablet Take 50 mg by mouth daily.   . budesonide (PULMICORT) 0.25 MG/2ML nebulizer solution Take 0.25 mg by nebulization 4 (four) times daily.   . chlorpheniramine-HYDROcodone (TUSSIONEX) 10-8 MG/5ML  LQCR Take 5 mLs by mouth at bedtime as needed.  . Cholecalciferol (VITAMIN D3) 50000 UNITS CAPS Take 1 tablet by mouth daily.  Marland Kitchen dextromethorphan (DELSYM) 30 MG/5ML liquid Take 60 mg by mouth every 12 (twelve) hours as needed.   Marland Kitchen epoetin alfa (EPOGEN,PROCRIT) 29562 UNIT/ML injection Inject 10,000 Units into the skin once a week.    . ferrous sulfate 325 (65 FE) MG tablet Take 325 mg by mouth 2 (two) times daily.    . insulin aspart (NOVOLOG) 100 UNIT/ML injection Inject 0-20 Units into the skin 3 (three) times daily with meals.  . insulin glargine (LANTUS) 100 UNIT/ML injection Inject 12 Units into the skin at bedtime.   . lidocaine (LIDODERM) 5 % Place 1 patch onto the skin daily. Remove & Discard patch within 12 hours or as directed by MD  . [name] 1 G capsule Take 1 tablet by mouth daily.  Marland Kitchen omeprazole (PRILOSEC) 20 MG capsule Take 20 mg by mouth daily.    . phenazopyridine (PYRIDIUM) 100 MG tablet Take 100 mg by mouth every 6 (six) hours as needed (dysuria).  . polyethylene glycol (MIRALAX / GLYCOLAX) packet Take 17 g by mouth every 3 (three) days.  . rosuvastatin (CRESTOR) 20 MG tablet Take 20 mg by mouth daily.    Marland Kitchen senna (SENOKOT) 8.6 MG tablet Take 1 tablet by mouth daily.  Marland Kitchen tiotropium (SPIRIVA) 18 MCG inhalation capsule Place 18 mcg into inhaler and inhale daily.    Marland Kitchen torsemide (DEMADEX) 20 MG tablet Take 2 tablets (40 mg total) by mouth daily.  Marland Kitchen levofloxacin (LEVAQUIN) 500 MG tablet Take 1 tablet (500 mg total) by mouth daily.  . [DISCONTINUED] predniSONE (DELTASONE) 20 MG tablet Take 4 tabs  daily with food x 4 days, then 3 tabs daily x 4 days, then 2 tabs daily x 4 days, then 1 tab daily x4 days then STOP  . [EXPIRED] methylPREDNISolone acetate (DEPO-MEDROL) injection 120 mg

## 2013-11-20 NOTE — Assessment & Plan Note (Signed)
Recurrent exacerbation copd Plan A depomedrol 120mg  injection was given Take levaquin 500mg  daily for 7days No other medication changes Return 6 weeks

## 2013-11-21 ENCOUNTER — Encounter: Payer: Medicare Other | Admitting: Internal Medicine

## 2013-11-29 ENCOUNTER — Ambulatory Visit: Payer: Medicare Other | Admitting: Cardiology

## 2013-12-12 ENCOUNTER — Encounter: Payer: Self-pay | Admitting: Internal Medicine

## 2013-12-12 ENCOUNTER — Ambulatory Visit (INDEPENDENT_AMBULATORY_CARE_PROVIDER_SITE_OTHER): Payer: Medicare Other | Admitting: Internal Medicine

## 2013-12-12 VITALS — BP 110/69 | HR 65 | Ht 62.0 in

## 2013-12-12 DIAGNOSIS — I495 Sick sinus syndrome: Secondary | ICD-10-CM

## 2013-12-12 DIAGNOSIS — R7989 Other specified abnormal findings of blood chemistry: Secondary | ICD-10-CM

## 2013-12-12 DIAGNOSIS — R55 Syncope and collapse: Secondary | ICD-10-CM

## 2013-12-12 DIAGNOSIS — R799 Abnormal finding of blood chemistry, unspecified: Secondary | ICD-10-CM

## 2013-12-12 DIAGNOSIS — Z950 Presence of cardiac pacemaker: Secondary | ICD-10-CM

## 2013-12-12 LAB — MDC_IDC_ENUM_SESS_TYPE_INCLINIC
Battery Impedance: 133 Ohm
Battery Voltage: 2.79 V
Brady Statistic AP VP Percent: 1 %
Brady Statistic AP VS Percent: 4 %
Brady Statistic AS VP Percent: 4 %
Brady Statistic AS VS Percent: 91 %
Date Time Interrogation Session: 20150401152439
Lead Channel Impedance Value: 445 Ohm
Lead Channel Impedance Value: 455 Ohm
Lead Channel Pacing Threshold Amplitude: 0.75 V
Lead Channel Pacing Threshold Pulse Width: 0.4 ms
Lead Channel Sensing Intrinsic Amplitude: 2 mV
Lead Channel Sensing Intrinsic Amplitude: 8 mV
Lead Channel Setting Pacing Amplitude: 2 V
Lead Channel Setting Pacing Pulse Width: 0.4 ms
MDC IDC MSMT BATTERY REMAINING LONGEVITY: 151 mo
MDC IDC MSMT LEADCHNL RV PACING THRESHOLD AMPLITUDE: 0.75 V
MDC IDC MSMT LEADCHNL RV PACING THRESHOLD PULSEWIDTH: 0.4 ms
MDC IDC SET LEADCHNL RV PACING AMPLITUDE: 2.5 V
MDC IDC SET LEADCHNL RV SENSING SENSITIVITY: 2.8 mV

## 2013-12-12 LAB — BASIC METABOLIC PANEL
BUN: 52 mg/dL — ABNORMAL HIGH (ref 6–23)
CO2: 35 mEq/L — ABNORMAL HIGH (ref 19–32)
Calcium: 9.7 mg/dL (ref 8.4–10.5)
Chloride: 97 mEq/L (ref 96–112)
Creatinine, Ser: 1.8 mg/dL — ABNORMAL HIGH (ref 0.4–1.2)
GFR: 28.32 mL/min — ABNORMAL LOW (ref >60.00)
Glucose, Bld: 88 mg/dL (ref 70–99)
Potassium: 4.3 mEq/L (ref 3.5–5.1)
Sodium: 140 mEq/L (ref 135–145)

## 2013-12-12 NOTE — Progress Notes (Signed)
skf      Patient Care Team: Angela CoxGayani Y Dasanayaka, MD as PCP - General (Internal Medicine) Storm FriskPatrick E Wright, MD (Pulmonary Disease)   HPI  [name] is a 80 y.o. female seen in followup for syncope for which she underwent pacemaker implantation some time ago. She underwent pacemaker generator replacement December 2012   She was hospitalized January 2015 following a fall. This is associated with recurrent orthostatic intolerance and a second fall upon standing with support. On arrival in the emergency room her blood pressure was 95. EMS records are not available. She was intubated because of change in mental status.    It was felt also to be associated with HFpEF in the context of renal insufficiency.  Edema is back to baseline. Dyspnea is stable. She denies orthostatic intolerance.   Echo May 2009 Normal LV function  Catheterization April 2006 demonstrated LAD 40% stenosis. This proximal stent which was patent with 40-50% stenosis. First diagonal had ostial 60% stenosis, second diagonal was normal. The circumflex had luminal irregularities. There was a ramus intermediate with ostial 60% stenosis, mid obtuse marginalhad luminal irregularities, the right coronary artery is dominant with luminal irregularities. The patient had PTCA of the LAD in 2002)   Past Medical History  Diagnosis Date  . COPD (chronic obstructive pulmonary disease)   . Diastolic congestive heart failure   . C. difficile colitis   . Hypertension   . Hyperlipidemia   . CAD (coronary artery disease)   . Anemia   . Type 2 diabetes mellitus   . Pacemaker 2002  . Cardiac arrest - asystole 2002  . Gallstones   . Pulmonary hypertension   . Renal insufficiency   . Osteoarthritis   . Allergic rhinitis   . Gout   . Stasis dermatitis   . Sinoatrial node dysfunction   . Syncope   . Postsurgical percutaneous transluminal coronary angioplasty status   . Dyslipidemia   . Acute bronchitis   . History of  cholelithiasis   . Obesity     Past Surgical History  Procedure Laterality Date  . Ptca  2002  . Appendectomy    . Vesicovaginal fistula closure w/ tah    . Pacemaker placement  2002    Current Outpatient Prescriptions  Medication Sig Dispense Refill  . albuterol (PROVENTIL) (2.5 MG/3ML) 0.083% nebulizer solution Take 3 mLs (2.5 mg total) by nebulization every 4 (four) hours while awake. And every 4 hours as needed for wheezing  75 mL    . allopurinol (ZYLOPRIM) 100 MG tablet Take 100 mg by mouth 2 (two) times daily.        Marland Kitchen. aspirin 81 MG tablet Take 81 mg by mouth daily.        Marland Kitchen. atenolol (TENORMIN) 50 MG tablet Take 50 mg by mouth daily.       . budesonide (PULMICORT) 0.25 MG/2ML nebulizer solution Take 0.25 mg by nebulization 4 (four) times daily.       . chlorpheniramine-HYDROcodone (TUSSIONEX) 10-8 MG/5ML LQCR Take 5 mLs by mouth at bedtime as needed.      . Cholecalciferol (VITAMIN D3) 50000 UNITS CAPS Take 1 tablet by mouth daily.      Marland Kitchen. dextromethorphan (DELSYM) 30 MG/5ML liquid Take 60 mg by mouth every 12 (twelve) hours as needed.       Marland Kitchen. epoetin alfa (EPOGEN,PROCRIT) 6045410000 UNIT/ML injection Inject 10,000 Units into the skin once a week.        . ferrous sulfate 325 (65  FE) MG tablet Take 325 mg by mouth 2 (two) times daily.        . insulin aspart (NOVOLOG) 100 UNIT/ML injection Inject 0-20 Units into the skin 3 (three) times daily with meals.  10 mL  11  . insulin glargine (LANTUS) 100 UNIT/ML injection Inject 12 Units into the skin at bedtime.       Marland Kitchen levofloxacin (LEVAQUIN) 500 MG tablet Take 1 tablet (500 mg total) by mouth daily.  7 tablet  0  . lidocaine (LIDODERM) 5 % Place 1 patch onto the skin daily. Remove & Discard patch within 12 hours or as directed by MD      . [name] 1 G capsule Take 1 tablet by mouth daily.      Marland Kitchen omeprazole (PRILOSEC) 20 MG capsule Take 20 mg by mouth daily.        . phenazopyridine (PYRIDIUM) 100 MG tablet Take 100 mg by mouth every 6 (six)  hours as needed (dysuria).      . polyethylene glycol (MIRALAX / GLYCOLAX) packet Take 17 g by mouth every 3 (three) days.      . rosuvastatin (CRESTOR) 20 MG tablet Take 20 mg by mouth daily.        Marland Kitchen senna (SENOKOT) 8.6 MG tablet Take 1 tablet by mouth daily.      Marland Kitchen tiotropium (SPIRIVA) 18 MCG inhalation capsule Place 18 mcg into inhaler and inhale daily.        Marland Kitchen torsemide (DEMADEX) 20 MG tablet Take 2 tablets (40 mg total) by mouth daily.  60 tablet  6   No current facility-administered medications for this visit.    Allergies  Allergen Reactions  . Codeine     REACTION: unspecified  . Doxycycline   . Guaifenesin & Derivatives   . Penicillins     REACTION: unspecified    Review of Systems negative except from HPI and PMH  Physical Exam BP 110/69  Pulse 65  Ht 5\' 2"  (1.575 m) Well developed and well nourished sitting in a wheelchair using oxygen in no distress HENT normal E scleral and icterus clear Neck Supple Clear to ausculation  Regular rate and rhythm 2/6 murmur Soft with active bowel sounds No clubbing cyanosis  Edema Alert and oriented, grossly normal motor and sensory function Skin Warm and Dry    Assessment and  Plan  HFpEF  Pacemaker-[employer] The patient's device was interrogated.  The information was reviewed. No changes were made in the programming.    Syncope-likely orthostatic  Renal insufficiency  Diabetes  Hyperkalemia  Coronary artery disease with proximal LAD stenting  She is relatively stable. I suspect that her syncopal episode with orthostatic based on the history of blood pressure noted in the emergency room record. As well as her very elevated BUN/creatinine ratio.  Her blood pressure relatively low today and based on recent data on beta blockers we will decrease it to 50-25 mg (atenolol)  We will recheck her blood work as her potassium was elevated in the last available work in January.  In the event that orthostatic symptoms  recur, I would have a low threshold for using ProAmatine.

## 2013-12-12 NOTE — Patient Instructions (Signed)
Your physician recommends that you return for lab work today: BMET  Your physician recommends that you continue on your current medications as directed. Please refer to the Current Medication list given to you today.  Your physician wants you to follow-up in: 6 months with device clinic.  You will receive a reminder letter in the mail two months in advance. If you don't receive a letter, please call our office to schedule the follow-up appointment.  Your physician wants you to follow-up in: 1 year with Dr. Graciela HusbandsKlein.  You will receive a reminder letter in the mail two months in advance. If you don't receive a letter, please call our office to schedule the follow-up appointment.

## 2013-12-19 ENCOUNTER — Telehealth: Payer: Self-pay | Admitting: Internal Medicine

## 2013-12-19 NOTE — Telephone Encounter (Signed)
New problem   Need to speak to nurse concerning the consult report because they were unable to read it.

## 2013-12-19 NOTE — Telephone Encounter (Signed)
Faxed over copy of office note for them to understand instructions for pt. [name] is agreeable to plan.

## 2014-01-02 ENCOUNTER — Encounter: Payer: Self-pay | Admitting: Critical Care Medicine

## 2014-01-02 ENCOUNTER — Ambulatory Visit (INDEPENDENT_AMBULATORY_CARE_PROVIDER_SITE_OTHER): Payer: Medicare Other | Admitting: Critical Care Medicine

## 2014-01-02 VITALS — BP 134/70 | HR 77 | Ht 59.0 in | Wt 151.4 lb

## 2014-01-02 DIAGNOSIS — J449 Chronic obstructive pulmonary disease, unspecified: Secondary | ICD-10-CM

## 2014-01-02 NOTE — Progress Notes (Signed)
Subjective:    Patient ID: [name], female    DOB: 1927-10-04, 80 y.o.   MRN: [id number]  HPI  80 y.o.F quit smoking in 1987 and chronic renal insufficiency with coronary artery disease. The patient had history of congestive heart failure and diabetes. The patient has been hospitalized on numerous occasions for respiratory failure and pneumonia. Patient currently is residing in a nursing care facility.     10/05/2013 Chief Complaint  Patient presents with  . Post Hospital Follow up    Breathing gets a little "heavy" when its time for breathing treatment.  Once she has tx, breathing improves.  Cough with white mucus.  No chest tightness/pain.  Adm 09/14/13>>09/19/13  resp failure, [facility], [facility]  Had already had tamiflu/avelox.  Intubated and vent. Confused.  CT head NEG.  Influenza A Pos.  extub 09/16/13.    Since d/c is still dyspneic at times.  Pt on q6H now neb. No real chst pain.  Mucus is now white, sl color yellow  SCr  3.5>>2.18 on d/c. Notes more edema and abd swollen  11/19/2013 Chief Complaint  Patient presents with  . Follow-up    pt c/o having increased SOB and productive cough with white phlegm.    Pt remains swollen in feet and abd.  Coughing more mucus white. No fever.  No real chest pain .    01/02/2014 Chief Complaint  Patient presents with  . 6 week follow up    Pt states breathing and sob has improved. Pt states she has increase sob and fatigue when going to the bathroom. Pt states she has produtive cough with white mucous.  Denies CP.   Pt is better vs march OV.    Pt denies any significant sore throat, nasal congestion or excess secretions, fever, chills, sweats, unintended weight loss, pleurtic or exertional chest pain, orthopnea PND, or leg swelling Pt denies any increase in rescue therapy over baseline, denies waking up needing it or having any early am or nocturnal exacerbations of coughing/wheezing/or dyspnea. Pt also denies any obvious  fluctuation in symptoms with  weather or environmental change or other alleviating or aggravating factors    Objective:    Review of Systems  Constitutional:   No  weight loss, night sweats,  Fevers, chills, fatigue, lassitude. HEENT:   No headaches,  Difficulty swallowing,  Tooth/dental problems,  Sore throat,                No sneezing, itching, ear ache, nasal congestion, post nasal drip,   CV:  No chest pain,  Orthopnea, PND, swelling in lower extremities, anasarca, dizziness, palpitations  GI  No heartburn, indigestion, abdominal pain, nausea, vomiting, diarrhea, change in bowel habits, loss of appetite  Resp: Notes  shortness of breath with exertion not  at rest.  No excess mucus, no productive cough,  No non-productive cough,  No coughing up of blood.  No change in color of mucus.  No wheezing.  No chest wall deformity  Skin: no rash or lesions.  GU: no dysuria, change in color of urine, no urgency or frequency.  No flank pain.  MS:  No joint pain or swelling.  No decreased range of motion.  No back pain.  Psych:  No change in mood or affect. No depression or anxiety.  No memory loss.     Objective:   Physical Exam  Filed Vitals:   01/02/14 1054  BP: 134/70  Pulse: 77  Height: 4'  11" (1.499 m)  Weight: 151 lb 6.4 oz (68.675 kg)  SpO2: 96%    Gen: Pleasant, well-nourished, in no distress,  normal affect  ENT: No lesions,  mouth clear,  oropharynx clear, no postnasal drip  Neck: No JVD, no TMG, no carotid bruits  Lungs: No use of accessory muscles, no dullness to percussion, expired wheezes  Cardiovascular: RRR, heart sounds normal, no murmur or gallops, 3+ peripheral edema  Abdomen: soft and NT, no HSM,  BS normal  Musculoskeletal: No deformities, no cyanosis or clubbing  Neuro: alert, non focal  Skin: Warm, no lesions or rashes        Assessment & Plan:   COPD, Gold C Stable copd  No changes planned    Updated Medication List Outpatient  Encounter Prescriptions as of 01/02/2014  Medication Sig  . albuterol (PROVENTIL) (2.5 MG/3ML) 0.083% nebulizer solution Take 3 mLs (2.5 mg total) by nebulization every 4 (four) hours while awake. And every 4 hours as needed for wheezing  . allopurinol (ZYLOPRIM) 100 MG tablet Take 100 mg by mouth 2 (two) times daily.    Marland Kitchen. aspirin 81 MG tablet Take 81 mg by mouth daily.    Marland Kitchen. atenolol (TENORMIN) 50 MG tablet Take 50 mg by mouth daily.   . budesonide (PULMICORT) 0.25 MG/2ML nebulizer solution Take 0.25 mg by nebulization 4 (four) times daily.   . chlorpheniramine-HYDROcodone (TUSSIONEX) 10-8 MG/5ML LQCR Take 5 mLs by mouth at bedtime as needed.  . Cholecalciferol (VITAMIN D3) 50000 UNITS CAPS Take 1 tablet by mouth daily.  Marland Kitchen. epoetin alfa (EPOGEN,PROCRIT) 5732210000 UNIT/ML injection Inject 10,000 Units into the skin once a week.    . ferrous sulfate 325 (65 FE) MG tablet Take 325 mg by mouth 2 (two) times daily.    . insulin aspart (NOVOLOG) 100 UNIT/ML injection Inject 0-20 Units into the skin 3 (three) times daily with meals.  . insulin glargine (LANTUS) 100 UNIT/ML injection Inject 12 Units into the skin at bedtime.   . lidocaine (LIDODERM) 5 % Place 1 patch onto the skin daily. Remove & Discard patch within 12 hours or as directed by MD  . Riesa PopeLOVAZA 1 G capsule Take 1 tablet by mouth daily.  Marland Kitchen. omeprazole (PRILOSEC) 20 MG capsule Take 20 mg by mouth daily.    . phenazopyridine (PYRIDIUM) 100 MG tablet Take 100 mg by mouth every 6 (six) hours as needed (dysuria).  . polyethylene glycol (MIRALAX / GLYCOLAX) packet Take 17 g by mouth every 3 (three) days.  . rosuvastatin (CRESTOR) 20 MG tablet Take 20 mg by mouth daily.    Marland Kitchen. senna (SENOKOT) 8.6 MG tablet Take 1 tablet by mouth daily.  Marland Kitchen. tiotropium (SPIRIVA) 18 MCG inhalation capsule Place 18 mcg into inhaler and inhale daily.    Marland Kitchen. torsemide (DEMADEX) 20 MG tablet Take 2 tablets (40 mg total) by mouth daily.  Marland Kitchen. dextromethorphan (DELSYM) 30 MG/5ML liquid  Take 60 mg by mouth every 12 (twelve) hours as needed.   . [DISCONTINUED] levofloxacin (LEVAQUIN) 500 MG tablet Take 1 tablet (500 mg total) by mouth daily.

## 2014-01-02 NOTE — Patient Instructions (Signed)
No change in medications. Return in   3 months 

## 2014-01-03 NOTE — Assessment & Plan Note (Signed)
Stable copd  No changes planned

## 2014-01-07 ENCOUNTER — Encounter: Payer: Self-pay | Admitting: Cardiology

## 2014-01-07 ENCOUNTER — Ambulatory Visit (INDEPENDENT_AMBULATORY_CARE_PROVIDER_SITE_OTHER): Payer: Medicare Other | Admitting: Cardiology

## 2014-01-07 VITALS — BP 147/67 | HR 70 | Ht 59.0 in | Wt 164.0 lb

## 2014-01-07 DIAGNOSIS — Z950 Presence of cardiac pacemaker: Secondary | ICD-10-CM

## 2014-01-07 DIAGNOSIS — I10 Essential (primary) hypertension: Secondary | ICD-10-CM

## 2014-01-07 DIAGNOSIS — I495 Sick sinus syndrome: Secondary | ICD-10-CM

## 2014-01-07 DIAGNOSIS — I2510 Atherosclerotic heart disease of native coronary artery without angina pectoris: Secondary | ICD-10-CM

## 2014-01-07 NOTE — Progress Notes (Signed)
HPI The patient presents for followup of diastolic dysfunction and edema and CAD.  Since I last saw her she was hospitalized and intubated for COPD exacerbation. I reviewed these records. Interestingly despite this critical illness her cardiac enzymes were never elevated. She thinks that since that time her breathing has been fine. She denies any chest pressure, neck or arm discomfort. She's not been getting any palpitations, presyncope or syncope. She is on chronic oxygen. She ambulates somewhat slowly. She has had increasing lower extremity swelling with some weeping edema and she is wrapping her own legs. She reports that she does try to keep them elevated.   Allergies  Allergen Reactions  . Codeine     REACTION: unspecified  . Doxycycline   . Guaifenesin & Derivatives   . Penicillins     REACTION: unspecified    Current Outpatient Prescriptions  Medication Sig Dispense Refill  . albuterol (PROVENTIL) (2.5 MG/3ML) 0.083% nebulizer solution Take 3 mLs (2.5 mg total) by nebulization every 4 (four) hours while awake. And every 4 hours as needed for wheezing  75 mL    . allopurinol (ZYLOPRIM) 100 MG tablet Take 100 mg by mouth 2 (two) times daily.        Marland Kitchen. aspirin 81 MG tablet Take 81 mg by mouth daily.        Marland Kitchen. atenolol (TENORMIN) 50 MG tablet Take 50 mg by mouth daily.       . budesonide (PULMICORT) 0.25 MG/2ML nebulizer solution Take 0.25 mg by nebulization 4 (four) times daily.       . chlorpheniramine-HYDROcodone (TUSSIONEX) 10-8 MG/5ML LQCR Take 5 mLs by mouth at bedtime as needed.      . Cholecalciferol (VITAMIN D3) 50000 UNITS CAPS Take 1 tablet by mouth daily.      Marland Kitchen. dextromethorphan (DELSYM) 30 MG/5ML liquid Take 60 mg by mouth every 12 (twelve) hours as needed.       . doxycycline (VIBRAMYCIN) 100 MG capsule       . epoetin alfa (EPOGEN,PROCRIT) 0981110000 UNIT/ML injection Inject 10,000 Units into the skin once a week.        . ferrous sulfate 325 (65 FE) MG tablet Take 325 mg by  mouth 2 (two) times daily.        . insulin aspart (NOVOLOG) 100 UNIT/ML injection Inject 0-20 Units into the skin 3 (three) times daily with meals.  10 mL  11  . insulin glargine (LANTUS) 100 UNIT/ML injection Inject 12 Units into the skin at bedtime.       . lidocaine (LIDODERM) 5 % Place 1 patch onto the skin daily. Remove & Discard patch within 12 hours or as directed by MD      . Riesa PopeLOVAZA 1 G capsule Take 1 tablet by mouth daily.      Marland Kitchen. omeprazole (PRILOSEC) 20 MG capsule Take 20 mg by mouth daily.        . phenazopyridine (PYRIDIUM) 100 MG tablet Take 100 mg by mouth every 6 (six) hours as needed (dysuria).      . polyethylene glycol (MIRALAX / GLYCOLAX) packet Take 17 g by mouth every 3 (three) days.      . rosuvastatin (CRESTOR) 20 MG tablet Take 20 mg by mouth daily.        Marland Kitchen. senna (SENOKOT) 8.6 MG tablet Take 1 tablet by mouth daily.      Marland Kitchen. tiotropium (SPIRIVA) 18 MCG inhalation capsule Place 18 mcg into inhaler and inhale daily.        .Marland Kitchen  torsemide (DEMADEX) 20 MG tablet Take 2 tablets (40 mg total) by mouth daily.  60 tablet  6   No current facility-administered medications for this visit.    Past Medical History  Diagnosis Date  . COPD (chronic obstructive pulmonary disease)   . Diastolic congestive heart failure   . C. difficile colitis   . Hypertension   . Hyperlipidemia   . CAD (coronary artery disease)   . Anemia   . Type 2 diabetes mellitus   . Pacemaker 2002  . Cardiac arrest - asystole 2002  . Gallstones   . Pulmonary hypertension   . Renal insufficiency   . Osteoarthritis   . Allergic rhinitis   . Gout   . Stasis dermatitis   . Sinoatrial node dysfunction   . Syncope   . Postsurgical percutaneous transluminal coronary angioplasty status   . Dyslipidemia   . Acute bronchitis   . History of cholelithiasis   . Obesity     Past Surgical History  Procedure Laterality Date  . Ptca  2002  . Appendectomy    . Vesicovaginal fistula closure w/ tah    .  Pacemaker placement  2002    ROS: As stated in the HPI and negative for all other systems.  PHYSICAL EXAM BP 147/67  Pulse 70  Ht 4\' 11"  (1.499 m)  Wt 164 lb (74.39 kg)  BMI 33.11 kg/m2 PHYSICAL EXAM GEN:  No distress NECK:  No jugular venous distention at 45 degrees, waveform within normal limits, carotid upstroke brisk and symmetric, no bruits, no thyromegaly LYMPHATICS:  No cervical adenopathy LUNGS:  Decreased breath sounds without fine crackles, few scattered rhonchi BACK:  No CVA tenderness CHEST:  Well healed pacer pocket. HEART:  S1 and S2 within normal limits, no S3, no S4, no clicks, no rubs, no murmurs ABD:  Positive bowel sounds normal in frequency in pitch, no bruits, no rebound, no guarding. EXT:  2 plus pulses throughout, moderate to severe bilateral lower extremity edema less than previous, no cyanosis no clubbing, with chronic venous stasis, the left leg is more swollen than the right with some erythema.  There is some very mild erythema between the right great toe and right second toe.    EKG:  Sinus rhythm, rate 70, right bundle branch block,  no acute ST-T wave changes.  RBBB is new compared with previous EKGs.   01/07/2014  ASSESSMENT AND PLAN  DIASTOLIC HEART FAILURE, CHRONIC -  She does have some increased lower extremity swelling. I'm going to give her an extra 20 mg of Demodex for the next 2 days. With the watch carefully as she does have significant renal insufficiency. Her last creatinine however was down to 1.8.  HYPERTENSION - The blood pressure is at target. No change in medications is indicated. We will continue with therapeutic lifestyle changes (TLC).   PACEMAKER - She is now up to date with pacemaker follow up.   CORONARY ARTERY DISEASE - Given the fact that she.  Has significant respiratory events and no elevated cardiac enzymes I do not think that any change in therapy or further ischemia evaluation is indicated. She does get some fleeting chest  discomfort but this is atypical.

## 2014-01-07 NOTE — Patient Instructions (Signed)
Please increase Demadex to 60 mg a day for 2 days then return to 40 mg daily. Continue all other medications as listed.  Follow up in 1 month with PA/NP.

## 2014-01-09 ENCOUNTER — Non-Acute Institutional Stay (SKILLED_NURSING_FACILITY): Payer: PRIVATE HEALTH INSURANCE | Admitting: Internal Medicine

## 2014-01-09 DIAGNOSIS — J449 Chronic obstructive pulmonary disease, unspecified: Secondary | ICD-10-CM

## 2014-01-09 DIAGNOSIS — I2510 Atherosclerotic heart disease of native coronary artery without angina pectoris: Secondary | ICD-10-CM

## 2014-01-09 DIAGNOSIS — E785 Hyperlipidemia, unspecified: Secondary | ICD-10-CM

## 2014-01-09 DIAGNOSIS — E1129 Type 2 diabetes mellitus with other diabetic kidney complication: Secondary | ICD-10-CM

## 2014-01-11 NOTE — Progress Notes (Addendum)
               PROGRESS NOTE  DATE: 01-09-14  FACILITY: Nursing Home [facility]  LEVEL OF CARE: SNF (31)  Routine Visit  CHIEF COMPLAINT:  Manage COPD, diabetes mellitus and CAD  HISTORY OF PRESENT ILLNESS:  REASSESSMENT OF ONGOING PROBLEM(S):  CAD: The angina has been stable. The patient denies dyspnea on exertion, orthopnea,  palpitations and paroxysmal nocturnal dyspnea. Complains of chronic bilateral lower extremity swelling. No complications noted from the medication presently being used.  COPD: the COPD remains stable.  Pt denies sob, cough, wheezing or declining exercise tolerance.  No complications from the medications presently being used.  DM:pt's DM remains stable.  Pt denies polyuria, polydipsia, polyphagia, changes in vision or hypoglycemic episodes.  No complications noted from the medication presently being used.  Last hemoglobin A1c is: 5.5 in 4/14, in 10/14 hemoglobin A1c 5.7, in 1-15 hemoglobin A1c 6.2  PAST MEDICAL HISTORY : Reviewed.  No changes.  CURRENT MEDICATIONS: [facility]  REVIEW OF SYSTEMS:  GENERAL: no change in appetite, no fatigue, no weight changes, no fever, chills or weakness RESPIRATORY: no cough, SOB, DOE, wheezing, hemoptysis CARDIAC: no chest pain, or palpitations, complains of chronic bilateral lower extremity swelling GI: no abdominal pain, diarrhea, constipation, heart burn, nausea or vomiting  PHYSICAL EXAMINATION  VS:  See vital signs section  GENERAL: no acute distress, moderately obese body habitus EYES: conjunctivae normal, sclerae normal, normal eye lids NECK: supple, trachea midline, no neck masses, no thyroid tenderness, no thyromegaly LYMPHATICS: no LAN in the neck, no supraclavicular LAN RESPIRATORY: breathing is even & unlabored, BS CTAB CARDIAC: RRR, no murmur,no extra heart sounds, +3 left lower extremity edema and +2 right lower extremity edema GI: abdomen soft, normal BS, no masses, no  tenderness, no hepatomegaly, no splenomegaly PSYCHIATRIC: the patient is alert & oriented to person, affect & behavior appropriate  LABS/RADIOLOGY: 4-15 glucose 147, BUN 56, creatinine 2.7 otherwise BMP normal, hemoglobin 10.3, MCV 86, platelets 132, WBC 10.7, ferritin 191, liver profile normal, fasting lipid panel normal 3-15 iron panel normal 12-14 hemoglobin 10, MCV 87, platelets 143, WBC 8.5, glucose 100, BUN 43, creatinine 1.95 otherwise CMP normal, triglycerides 200, HDL 38, total cholesterol 045137, LDL 59  11-14 hemoglobin 9, MCV 89 otherwise CBC normal  10- 14 glucose 125, BUN 38, creatinine 2.08, otherwise BMP normal, WBC 11.5, hemoglobin 10.3, MCV 93, platelets 156  9-14 WBC 11.8, hemoglobin 8.3, MCV 85, platelets 136, BUN 54, creatinine 1.82 otherwise BMP normal, total protein 5.6, albumin 3.4 otherwise liver profile normal, triglycerides 152, HDL 36, total potential 1:30, HDL 64, TSH 1.82  7/14 WBC 14.8, normal being 13.8, platelets 104, BUN 96, creatinine 2.2 otherwise CMP normal, repeat labs: WBC 18.4, hemoglobin 12.9, platelets 104, glucose 229, BUN 101, creatinine 2.38, potassium 5.9 otherwise BMP normal, liver profile is normal 6/14 triglycerides 20, HDL 35 otherwise fasting lipid panel normal  ASSESSMENT/PLAN:  COPD-well compensated. CAD-stable Diabetes mellitus with renal complications-well-controlled. Hyperlipidemia -well-controlled. Anemia of chronic kidney disease-stable. On iron. Chronic kidney disease stage IV-renal functions improved CHF-well compensated. GERD-stable. Osteoarthritis-pain well controlled. Constipation-well-controlled Hypokalemia -- well-controlled Gout-on allopurinol  CPT CODE: [id number]  [name] PiggGayani Y. Kerry Doryasanayaka, MD [facility] [phone]

## 2014-01-28 ENCOUNTER — Non-Acute Institutional Stay (SKILLED_NURSING_FACILITY): Payer: PRIVATE HEALTH INSURANCE | Admitting: Internal Medicine

## 2014-01-28 DIAGNOSIS — J449 Chronic obstructive pulmonary disease, unspecified: Secondary | ICD-10-CM

## 2014-01-28 DIAGNOSIS — E785 Hyperlipidemia, unspecified: Secondary | ICD-10-CM

## 2014-01-28 DIAGNOSIS — I2510 Atherosclerotic heart disease of native coronary artery without angina pectoris: Secondary | ICD-10-CM

## 2014-01-28 DIAGNOSIS — E1129 Type 2 diabetes mellitus with other diabetic kidney complication: Secondary | ICD-10-CM

## 2014-01-28 NOTE — Progress Notes (Signed)
                PROGRESS NOTE  DATE: 01-28-14  FACILITY: Nursing Home [facility]  LEVEL OF CARE: SNF (31)  Routine Visit  CHIEF COMPLAINT:  Manage COPD, diabetes mellitus and CAD  HISTORY OF PRESENT ILLNESS:  REASSESSMENT OF ONGOING PROBLEM(S):  CAD: The angina has been stable. The patient denies dyspnea on exertion, orthopnea,  palpitations and paroxysmal nocturnal dyspnea. Complains of chronic bilateral lower extremity swelling. No complications noted from the medication presently being used.  COPD: the COPD remains stable.  Pt denies sob, cough, wheezing or declining exercise tolerance.  No complications from the medications presently being used.  DM:pt's DM remains stable.  Pt denies polyuria, polydipsia, polyphagia, changes in vision or hypoglycemic episodes.  No complications noted from the medication presently being used.  Last hemoglobin A1c is: 5.5 in 4/14, in 10/14 hemoglobin A1c 5.7, in 1-15 hemoglobin A1c 6.2  PAST MEDICAL HISTORY : Reviewed.  No changes.  CURRENT MEDICATIONS: [facility]  REVIEW OF SYSTEMS:  GENERAL: no change in appetite, no fatigue, no weight changes, no fever, chills or weakness RESPIRATORY: no cough, SOB, DOE, wheezing, hemoptysis CARDIAC: no chest pain, or palpitations, complains of chronic bilateral lower extremity swelling GI: no abdominal pain, diarrhea, constipation, heart burn, nausea or vomiting  PHYSICAL EXAMINATION  VS:  See vital signs section  GENERAL: no acute distress, moderately obese body habitus EYES: conjunctivae normal, sclerae normal, normal eye lids NECK: supple, trachea midline, no neck masses, no thyroid tenderness, no thyromegaly LYMPHATICS: no LAN in the neck, no supraclavicular LAN RESPIRATORY: breathing is even & unlabored, BS CTAB CARDIAC: RRR, no murmur,no extra heart sounds, +3 left lower extremity edema and +2 right lower extremity edema GI: abdomen soft, normal BS, no masses,  no tenderness, no hepatomegaly, no splenomegaly PSYCHIATRIC: the patient is alert & oriented to person, affect & behavior appropriate  LABS/RADIOLOGY: 4-15 glucose 147, BUN 56, creatinine 2.7 otherwise BMP normal, hemoglobin 10.3, MCV 86, platelets 132, WBC 10.7, ferritin 191, liver profile normal, fasting lipid panel normal, percent saturation 13, TIBC 318, serum iron level 42 3-15 iron panel normal 12-14 hemoglobin 10, MCV 87, platelets 143, WBC 8.5, glucose 100, BUN 43, creatinine 1.95 otherwise CMP normal, triglycerides 200, HDL 38, total cholesterol 161137, LDL 59  11-14 hemoglobin 9, MCV 89 otherwise CBC normal  10- 14 glucose 125, BUN 38, creatinine 2.08, otherwise BMP normal, WBC 11.5, hemoglobin 10.3, MCV 93, platelets 156  9-14 WBC 11.8, hemoglobin 8.3, MCV 85, platelets 136, BUN 54, creatinine 1.82 otherwise BMP normal, total protein 5.6, albumin 3.4 otherwise liver profile normal, triglycerides 152, HDL 36, total potential 1:30, HDL 64, TSH 1.82  7/14 WBC 14.8, normal being 13.8, platelets 104, BUN 96, creatinine 2.2 otherwise CMP normal, repeat labs: WBC 18.4, hemoglobin 12.9, platelets 104, glucose 229, BUN 101, creatinine 2.38, potassium 5.9 otherwise BMP normal, liver profile is normal 6/14 triglycerides 20, HDL 35 otherwise fasting lipid panel normal  ASSESSMENT/PLAN:  COPD-well compensated. CAD-stable Diabetes mellitus with renal complications-well-controlled. Hyperlipidemia -well-controlled. Anemia of chronic kidney disease-stable. On iron. Chronic kidney disease stage IV-renal functions improved CHF-well compensated. GERD-stable. Osteoarthritis-pain well controlled. Constipation-well-controlled Hypokalemia -- well-controlled Gout-on allopurinol  CPT CODE: [id number]  Newton PiggGayani Y. Kerry Doryasanayaka, MD [facility] [phone]

## 2014-02-06 ENCOUNTER — Ambulatory Visit: Payer: Medicare Other | Admitting: Nurse Practitioner

## 2014-02-20 ENCOUNTER — Non-Acute Institutional Stay (SKILLED_NURSING_FACILITY): Payer: PRIVATE HEALTH INSURANCE | Admitting: Internal Medicine

## 2014-02-20 DIAGNOSIS — I2510 Atherosclerotic heart disease of native coronary artery without angina pectoris: Secondary | ICD-10-CM

## 2014-02-20 DIAGNOSIS — E785 Hyperlipidemia, unspecified: Secondary | ICD-10-CM

## 2014-02-20 DIAGNOSIS — E1129 Type 2 diabetes mellitus with other diabetic kidney complication: Secondary | ICD-10-CM

## 2014-02-20 DIAGNOSIS — J449 Chronic obstructive pulmonary disease, unspecified: Secondary | ICD-10-CM

## 2014-02-20 NOTE — Progress Notes (Signed)
               PROGRESS NOTE  DATE: 02-20-14  FACILITY: Nursing Home [facility]  LEVEL OF CARE: SNF (31)  Routine Visit  CHIEF COMPLAINT:  Manage COPD, diabetes mellitus and CAD  HISTORY OF PRESENT ILLNESS:  REASSESSMENT OF ONGOING PROBLEM(S):  CAD: The angina has been stable. The patient denies dyspnea on exertion, orthopnea,  palpitations and paroxysmal nocturnal dyspnea. Complains of chronic bilateral lower extremity swelling. No complications noted from the medication presently being used.  COPD: the COPD remains stable.  Pt denies sob, cough, wheezing or declining exercise tolerance.  No complications from the medications presently being used.  DM:pt's DM remains stable.  Pt denies polyuria, polydipsia, polyphagia, changes in vision or hypoglycemic episodes.  No complications noted from the medication presently being used.  Last hemoglobin A1c is: 5.5 in 4/14, in 10/14 hemoglobin A1c 5.7, in 1-15 hemoglobin A1c 6.2  PAST MEDICAL HISTORY : Reviewed.  No changes.  CURRENT MEDICATIONS: Reviewed per [facility]  REVIEW OF SYSTEMS:  GENERAL: no change in appetite, no fatigue, no weight changes, no fever, chills or weakness RESPIRATORY: no cough, SOB, DOE, wheezing, hemoptysis CARDIAC: no chest pain, or palpitations, complains of chronic bilateral lower extremity swelling GI: no abdominal pain, diarrhea, constipation, heart burn, nausea or vomiting  PHYSICAL EXAMINATION  VS:  See vital signs section  GENERAL: no acute distress, moderately obese body habitus EYES: conjunctivae normal, sclerae normal, normal eye lids NECK: supple, trachea midline, no neck masses, no thyroid tenderness, no thyromegaly LYMPHATICS: no LAN in the neck, no supraclavicular LAN RESPIRATORY: breathing is even & unlabored, BS CTAB CARDIAC: RRR, no murmur,no extra heart sounds,  +2 bilateral lower extremity edema GI: abdomen soft, normal BS, no masses, no tenderness, no hepatomegaly,  no splenomegaly PSYCHIATRIC: the patient is alert & oriented to person, affect & behavior appropriate  LABS/RADIOLOGY: 4-15 glucose 147, BUN 56, creatinine 2.7 otherwise BMP normal, hemoglobin 10.3, MCV 86, platelets 132, WBC 10.7, ferritin 191, liver profile normal, fasting lipid panel normal, percent saturation 13, TIBC 318, serum iron level 42 3-15 iron panel normal 12-14 hemoglobin 10, MCV 87, platelets 143, WBC 8.5, glucose 100, BUN 43, creatinine 1.95 otherwise CMP normal, triglycerides 200, HDL 38, total cholesterol 782, LDL 59  11-14 hemoglobin 9, MCV 89 otherwise CBC normal  10- 14 glucose 125, BUN 38, creatinine 2.08, otherwise BMP normal, WBC 11.5, hemoglobin 10.3, MCV 93, platelets 156  9-14 WBC 11.8, hemoglobin 8.3, MCV 85, platelets 136, BUN 54, creatinine 1.82 otherwise BMP normal, total protein 5.6, albumin 3.4 otherwise liver profile normal, triglycerides 152, HDL 36, total potential 1:30, HDL 64, TSH 1.82  7/14 WBC 14.8, normal being 13.8, platelets 104, BUN 96, creatinine 2.2 otherwise CMP normal, repeat labs: WBC 18.4, hemoglobin 12.9, platelets 104, glucose 229, BUN 101, creatinine 2.38, potassium 5.9 otherwise BMP normal, liver profile is normal 6/14 triglycerides 20, HDL 35 otherwise fasting lipid panel normal  ASSESSMENT/PLAN:  COPD-well compensated. CAD-stable Diabetes mellitus with renal complications-well-controlled. Hyperlipidemia -well-controlled. Anemia of chronic kidney disease-stable. On iron. Chronic kidney disease stage IV-renal functions improved CHF-well compensated. GERD-stable. Osteoarthritis-pain well controlled. Constipation-well-controlled Hypokalemia -- well-controlled Gout-on allopurinol  CPT CODE: 95621  Newton Pigg. Kerry Dory, MD [facility] [phone]

## 2014-03-06 ENCOUNTER — Non-Acute Institutional Stay (SKILLED_NURSING_FACILITY): Payer: PRIVATE HEALTH INSURANCE | Admitting: Internal Medicine

## 2014-03-06 DIAGNOSIS — N039 Chronic nephritic syndrome with unspecified morphologic changes: Principal | ICD-10-CM

## 2014-03-06 DIAGNOSIS — N189 Chronic kidney disease, unspecified: Secondary | ICD-10-CM

## 2014-03-06 DIAGNOSIS — D631 Anemia in chronic kidney disease: Secondary | ICD-10-CM

## 2014-03-06 NOTE — Progress Notes (Signed)
         PROGRESS NOTE  DATE: 03/06/2014  [facility]  LEVEL OF CARE: SNF (31)  Acute Visit  CHIEF COMPLAINT:  Manage anemia of CKD  HISTORY OF PRESENT ILLNESS: I was requested by the staff to assess the patient regarding above problem(s):  ANEMIA: The anemia has been stable. The patient denies fatigue, melena or hematochezia. No complications from the medications currently being used. On 6/18 Hb 11.  In 4/15 Hb 10.3, mcv 86.  PAST MEDICAL HISTORY : Reviewed.  No changes/see problem list  CURRENT MEDICATIONS: Reviewed per MAR/see medication list  PHYSICAL EXAMINATION  VS: see VS section  GENERAL: no acute distress, normal body habitus RESPIRATORY: breathing is even & unlabored, BS CTAB CARDIAC: RRR, no murmur,no extra heart sounds, +1 B edema  LABS/RADIOLOGY: 02-28-14 TIBC 271, Fe 35, % sat 13, ferritin 296  ASSESSMENT/PLAN:  Anemia of CKD-Hb improved.  Cont Fe & epogen.  CPT CODE: [id number]  Angela CoxGayani Y Sie Formisano, MD [facility] [phone]

## 2014-03-08 ENCOUNTER — Non-Acute Institutional Stay (SKILLED_NURSING_FACILITY): Payer: PRIVATE HEALTH INSURANCE | Admitting: Internal Medicine

## 2014-03-08 DIAGNOSIS — N184 Chronic kidney disease, stage 4 (severe): Secondary | ICD-10-CM

## 2014-03-08 DIAGNOSIS — I150 Renovascular hypertension: Secondary | ICD-10-CM

## 2014-03-18 ENCOUNTER — Telehealth: Payer: Self-pay | Admitting: Critical Care Medicine

## 2014-03-18 NOTE — Telephone Encounter (Signed)
Pt scheduled for 04/05/14 with PW 10:30 Pt c/o incr SOB - feels d/t weather change Pt states that she feels as though she is getting a cold. Offered earlier appt with Dr Sherene SiresWert or Rubye Oaksammy Parrett and patient refused stating that she wanted to see Dr Delford FieldWright. Advised pt to contact our office between now and then if there is any increase in her current symptoms Nothing further needed.

## 2014-03-19 DIAGNOSIS — I150 Renovascular hypertension: Secondary | ICD-10-CM | POA: Insufficient documentation

## 2014-03-19 NOTE — Progress Notes (Signed)
Patient ID: [name], female   DOB: 06-03-1928, 80 y.o.   MRN: [id number]            PROGRESS NOTE  DATE: 03/08/2014       [facility]  LEVEL OF CARE: SNF (31)  Acute Visit  CHIEF COMPLAINT:  Manage chronic kidney disease stage IV.    HISTORY OF PRESENT ILLNESS: I was requested by the staff to assess the patient regarding above problem(s):  CHRONIC KIDNEY DISEASE: The patient's chronic kidney disease remains stable.  Patient denies increasing lower extremity swelling or confusion. Last BUN and creatinine are:   On 03/07/2014:  BUN 47, creatinine 2.06.  In 12/2013:  BUN 56, creatinine 2.07.    PAST MEDICAL HISTORY : Reviewed.  No changes/see problem list  CURRENT MEDICATIONS: Reviewed per MAR/see medication list  REVIEW OF SYSTEMS:  GENERAL: no change in appetite, no fatigue, no weight changes, no fever, chills or weakness RESPIRATORY: no cough, SOB, DOE,, wheezing, hemoptysis CARDIAC: no chest pain or palpitations; complains of  chronic lower extremity swelling      GI: no abdominal pain, diarrhea, constipation, heart burn, nausea or vomiting      PHYSICAL EXAMINATION  VS:  T 98.6       P 82      RR 20      BP 150/76     POX 95%       WT (Lb) 152        GENERAL: no acute distress, normal body habitus EYES: conjunctivae normal, sclerae normal, normal eye lids NECK: supple, trachea midline, no neck masses, no thyroid tenderness, no thyromegaly LYMPHATICS: no LAN in the neck, no supraclavicular LAN RESPIRATORY: breathing is even & unlabored, BS CTAB CARDIAC: RRR, no murmur,no extra heart sounds, +2 bilateral lower extremity edema     GI: abdomen soft, normal BS, no masses, no tenderness, no hepatomegaly, no splenomegaly PSYCHIATRIC: the patient is alert & oriented to person, affect & behavior appropriate  ASSESSMENT/PLAN:  Chronic kidney disease stage IV.  Renal functions are stable.    Renovascular hypertension.   Uncontrolled.  Increase  Tenormin to 50 mg q.d.    CPT CODE: [id number]                Angela CoxGayani Y Dasanayaka, MD [facility] [phone]

## 2014-03-25 ENCOUNTER — Ambulatory Visit (INDEPENDENT_AMBULATORY_CARE_PROVIDER_SITE_OTHER): Payer: Medicare Other | Admitting: Cardiology

## 2014-03-25 ENCOUNTER — Encounter: Payer: Self-pay | Admitting: Cardiology

## 2014-03-25 VITALS — BP 110/70 | HR 69 | Ht 59.0 in | Wt 149.1 lb

## 2014-03-25 DIAGNOSIS — I2510 Atherosclerotic heart disease of native coronary artery without angina pectoris: Secondary | ICD-10-CM

## 2014-03-25 DIAGNOSIS — I10 Essential (primary) hypertension: Secondary | ICD-10-CM

## 2014-03-25 NOTE — Progress Notes (Signed)
HPI The patient presents for followup of diastolic dysfunction and edema and CAD.  Since I last saw her she has had some increasing shortness of breath. Her caretakers report that she's had some presyncope when he is been off of her oxygen and trying to get to the bathroom. She has had a cough productive of thick sputum although it is white. She has not had any fevers or chills. She actually has less edema than previously and her weight is down. She sleeps in a hospital bed with her head propped up but this has not changed. She's not describing classic PND. She's not having any chest pressure, neck or arm discomfort.   Allergies  Allergen Reactions  . Codeine     REACTION: unspecified  . Doxycycline   . Guaifenesin & Derivatives   . Penicillins     REACTION: unspecified    Current Outpatient Prescriptions  Medication Sig Dispense Refill  . albuterol (PROVENTIL) (2.5 MG/3ML) 0.083% nebulizer solution Take 3 mLs (2.5 mg total) by nebulization every 4 (four) hours while awake. And every 4 hours as needed for wheezing  75 mL    . allopurinol (ZYLOPRIM) 100 MG tablet Take 100 mg by mouth 2 (two) times daily.        Marland Kitchen aspirin 81 MG tablet Take 81 mg by mouth daily.        Marland Kitchen atenolol (TENORMIN) 50 MG tablet Take 50 mg by mouth daily.       . budesonide (PULMICORT) 0.25 MG/2ML nebulizer solution Take 0.25 mg by nebulization 4 (four) times daily.       . chlorpheniramine-HYDROcodone (TUSSIONEX) 10-8 MG/5ML LQCR Take 5 mLs by mouth at bedtime as needed.      . Cholecalciferol (VITAMIN D3) 50000 UNITS CAPS Take 1 tablet by mouth daily.      Marland Kitchen dextromethorphan (DELSYM) 30 MG/5ML liquid Take 60 mg by mouth every 12 (twelve) hours as needed.       . doxycycline (VIBRAMYCIN) 100 MG capsule       . epoetin alfa (EPOGEN,PROCRIT) 75643 UNIT/ML injection Inject 10,000 Units into the skin once a week.        . ferrous sulfate 325 (65 FE) MG tablet Take 325 mg by mouth 2 (two) times daily.        . insulin  aspart (NOVOLOG) 100 UNIT/ML injection Inject 0-20 Units into the skin 3 (three) times daily with meals.  10 mL  11  . insulin glargine (LANTUS) 100 UNIT/ML injection Inject 12 Units into the skin at bedtime.       . lidocaine (LIDODERM) 5 % Place 1 patch onto the skin daily. Remove & Discard patch within 12 hours or as directed by MD      . [name] 1 G capsule Take 1 tablet by mouth daily.      Marland Kitchen omeprazole (PRILOSEC) 20 MG capsule Take 20 mg by mouth daily.        . phenazopyridine (PYRIDIUM) 100 MG tablet Take 100 mg by mouth every 6 (six) hours as needed (dysuria).      . polyethylene glycol (MIRALAX / GLYCOLAX) packet Take 17 g by mouth every 3 (three) days.      . rosuvastatin (CRESTOR) 20 MG tablet Take 20 mg by mouth daily.        Marland Kitchen senna (SENOKOT) 8.6 MG tablet Take 1 tablet by mouth daily.      Marland Kitchen tiotropium (SPIRIVA) 18 MCG inhalation capsule Place 18 mcg into inhaler  and inhale daily.        Marland Kitchen. torsemide (DEMADEX) 20 MG tablet Take 2 tablets (40 mg total) by mouth daily.  60 tablet  6   No current facility-administered medications for this visit.    Past Medical History  Diagnosis Date  . COPD (chronic obstructive pulmonary disease)   . Diastolic congestive heart failure   . C. difficile colitis   . Hypertension   . Hyperlipidemia   . CAD (coronary artery disease)   . Anemia   . Type 2 diabetes mellitus   . Pacemaker 2002  . Cardiac arrest - asystole 2002  . Gallstones   . Pulmonary hypertension   . Renal insufficiency   . Osteoarthritis   . Allergic rhinitis   . Gout   . Stasis dermatitis   . Sinoatrial node dysfunction   . Syncope   . Postsurgical percutaneous transluminal coronary angioplasty status   . Dyslipidemia   . Acute bronchitis   . History of cholelithiasis   . Obesity     Past Surgical History  Procedure Laterality Date  . Ptca  2002  . Appendectomy    . Vesicovaginal fistula closure w/ tah    . Pacemaker placement  2002    ROS: As stated in  the HPI and negative for all other systems.  PHYSICAL EXAM BP 110/70  Pulse 69  Ht 4\' 11"  (1.499 m)  Wt 149 lb 1 oz (67.614 kg)  BMI 30.09 kg/m2 PHYSICAL EXAM GEN:  No distress NECK:  No jugular venous distention at 45 degrees, waveform within normal limits, carotid upstroke brisk and symmetric, no bruits, no thyromegaly LYMPHATICS:  No cervical adenopathy LUNGS:  Decreased breath sounds with coarse rhonchi BACK:  No CVA tenderness CHEST:  Well healed pacer pocket. HEART:  S1 and S2 within normal limits, no S3, no S4, no clicks, no rubs, no murmurs ABD:  Positive bowel sounds normal in frequency in pitch, no bruits, no rebound, no guarding. EXT:  2 plus pulses throughout, moderate bilateral lower extremity edema less than previous, no cyanosis no clubbing, with chronic venous stasis.  .    EKG:  Sinus rhythm, rate 69, right bundle branch block,  no acute ST-T wave changes.  RBBB is new compared with previous EKGs.   03/25/2014  ASSESSMENT AND PLAN  DYSPNEA - I think this is primarily a pulmonary issue. She does have scheduled followup of this is not for 11 days. I will try to move this up to have her seen sooner.  DIASTOLIC HEART FAILURE, CHRONIC -  I think that she is probably euvolemic relative to her usual. At this point I would not change her cardiac medications.  HYPERTENSION - The blood pressure is at target. No change in medications is indicated. We will continue with therapeutic lifestyle changes (TLC).   PACEMAKER - She is now up to date with pacemaker follow up.   CORONARY ARTERY DISEASE - No further work up is planned at this point.

## 2014-03-25 NOTE — Patient Instructions (Signed)
Your physician recommends that you schedule a follow-up appointment in: 6 months with Dr. Hoyle BarrHochrein  You will need an earlier appt. With Dr. Delford FieldWright than 11 days

## 2014-03-27 ENCOUNTER — Encounter: Payer: Self-pay | Admitting: Critical Care Medicine

## 2014-03-27 ENCOUNTER — Ambulatory Visit (INDEPENDENT_AMBULATORY_CARE_PROVIDER_SITE_OTHER): Payer: Medicare Other | Admitting: Critical Care Medicine

## 2014-03-27 VITALS — BP 138/80 | HR 67 | Temp 98.3°F | Ht 59.0 in | Wt 149.0 lb

## 2014-03-27 DIAGNOSIS — J449 Chronic obstructive pulmonary disease, unspecified: Secondary | ICD-10-CM

## 2014-03-27 MED ORDER — FLUTICASONE FUROATE-VILANTEROL 100-25 MCG/INH IN AEPB: 1.0000 | INHALATION_SPRAY | Freq: Every day | RESPIRATORY_TRACT | Status: AC

## 2014-03-27 MED ORDER — LEVOFLOXACIN 500 MG PO TABS: 500.0000 mg | ORAL_TABLET | Freq: Every day | ORAL | Status: DC

## 2014-03-27 MED ORDER — PREDNISONE 10 MG PO TABS: ORAL_TABLET | ORAL | Status: DC

## 2014-03-27 NOTE — Patient Instructions (Signed)
Start prednisone 10mg  Take 4 for three days 3 for three days 2 for three days 1 for three days and stop Start Breo one puff daily Start levaquin one daily til gone No other changes Return 6 weeks

## 2014-03-27 NOTE — Progress Notes (Signed)
Subjective:    Patient ID: [name], female    DOB: 1928/01/06, 80 y.o.   MRN: [id number]  HPI  80 y.o.F quit smoking in 1987 and chronic renal insufficiency with coronary artery disease. The patient had history of congestive heart failure and diabetes. The patient has been hospitalized on numerous occasions for respiratory failure and pneumonia. Patient currently is residing in a nursing care facility.     03/27/2014 Chief Complaint  Patient presents with  . Acute Visit    increased DOE x 2 wks, prod cough with thick, white mucus, and chest tightness.  [place]    Notes more dyspnea the past few weeks.  Mucus is thick and white. Notes more chest congestion.     Objective:    Review of Systems  Constitutional:   No  weight loss, night sweats,  Fevers, chills, fatigue, lassitude. HEENT:   No headaches,  Difficulty swallowing,  Tooth/dental problems,  Sore throat,                No sneezing, itching, ear ache, nasal congestion, post nasal drip,   CV:  No chest pain,  Orthopnea, PND, swelling in lower extremities, anasarca, dizziness, palpitations  GI  No heartburn, indigestion, abdominal pain, nausea, vomiting, diarrhea, change in bowel habits, loss of appetite  Resp: Notes  shortness of breath with exertion not  at rest.  No excess mucus, no productive cough,  No non-productive cough,  No coughing up of blood.  No change in color of mucus.  No wheezing.  No chest wall deformity  Skin: no rash or lesions.  GU: no dysuria, change in color of urine, no urgency or frequency.  No flank pain.  MS:  No joint pain or swelling.  No decreased range of motion.  No back pain.  Psych:  No change in mood or affect. No depression or anxiety.  No memory loss.     Objective:   Physical Exam  Filed Vitals:   03/27/14 1050  BP: 138/80  Pulse: 67  Temp: 98.3 F (36.8 C)  TempSrc: Oral  Height: 4\' 11"  (1.499 m)  Weight: 149 lb (67.586 kg)  SpO2: 99%     Gen: Pleasant, well-nourished, in no distress,  normal affect  ENT: No lesions,  mouth clear,  oropharynx clear, no postnasal drip  Neck: No JVD, no TMG, no carotid bruits  Lungs: No use of accessory muscles, no dullness to percussion, expired wheezes  Cardiovascular: RRR, heart sounds normal, no murmur or gallops, 3+ peripheral edema  Abdomen: soft and NT, no HSM,  BS normal  Musculoskeletal: No deformities, no cyanosis or clubbing  Neuro: alert, non focal  Skin: Warm, no lesions or rashes        Assessment & Plan:   COPD, Gold C Gold C copd with ongoing acute flare Plan Start prednisone 10mg  Take 4 for three days 3 for three days 2 for three days 1 for three days and stop Start Breo one puff daily Start levaquin one daily til gone No other changes Return 6 weeks     Updated Medication List Outpatient Encounter Prescriptions as of 03/27/2014  Medication Sig  . albuterol (PROVENTIL) (2.5 MG/3ML) 0.083% nebulizer solution Take 3 mLs (2.5 mg total) by nebulization every 4 (four) hours while awake. And every 4 hours as needed for wheezing  . allopurinol (ZYLOPRIM) 100 MG tablet Take 100 mg by mouth 2 (two) times daily.    Marland Kitchen. aspirin  81 MG tablet Take 81 mg by mouth daily.    Marland Kitchen atenolol (TENORMIN) 50 MG tablet Take 50 mg by mouth daily. Hold for SBP <110, HR < 60  . budesonide (PULMICORT) 0.25 MG/2ML nebulizer solution Take 0.25 mg by nebulization 4 (four) times daily.   . chlorpheniramine-HYDROcodone (TUSSIONEX) 10-8 MG/5ML LQCR Take 5 mLs by mouth at bedtime as needed.  . Cholecalciferol (VITAMIN D3) 50000 UNITS CAPS Take 1 tablet by mouth once a week.   Marland Kitchen dextromethorphan (DELSYM) 30 MG/5ML liquid Take 60 mg by mouth every 12 (twelve) hours as needed.   Marland Kitchen epoetin alfa (EPOGEN,PROCRIT) 40981 UNIT/ML injection Inject 10,000 Units into the skin once a week.    . ferrous sulfate 325 (65 FE) MG tablet Take 325 mg by mouth 2 (two) times daily.    . insulin aspart  (NOVOLOG) 100 UNIT/ML injection Inject 0-20 Units into the skin 3 (three) times daily with meals.  . insulin glargine (LANTUS) 100 UNIT/ML injection Inject 12 Units into the skin at bedtime.   . lidocaine (LIDODERM) 5 % Place 1 patch onto the skin daily. Remove & Discard patch within 12 hours or as directed by MD  . [name] 1 G capsule Take 1 tablet by mouth daily.  Marland Kitchen omeprazole (PRILOSEC) 20 MG capsule Take 20 mg by mouth daily.    . phenazopyridine (PYRIDIUM) 100 MG tablet Take 100 mg by mouth every 6 (six) hours as needed (dysuria).  . polyethylene glycol (MIRALAX / GLYCOLAX) packet Take 17 g by mouth every 3 (three) days.  . rosuvastatin (CRESTOR) 20 MG tablet Take 20 mg by mouth daily.    Marland Kitchen senna (SENOKOT) 8.6 MG tablet Take 1 tablet by mouth daily.  Marland Kitchen tiotropium (SPIRIVA) 18 MCG inhalation capsule Place 18 mcg into inhaler and inhale daily.    Marland Kitchen torsemide (DEMADEX) 20 MG tablet Take 2 tablets (40 mg total) by mouth daily.  . Fluticasone Furoate-Vilanterol (BREO ELLIPTA) 100-25 MCG/INH AEPB Inhale 1 puff into the lungs daily.  Marland Kitchen levofloxacin (LEVAQUIN) 500 MG tablet Take 1 tablet (500 mg total) by mouth daily.  . predniSONE (DELTASONE) 10 MG tablet Take 4 for three days 3 for three days 2 for three days 1 for three days and stop  . [DISCONTINUED] doxycycline (VIBRAMYCIN) 100 MG capsule

## 2014-03-28 DIAGNOSIS — E669 Obesity, unspecified: Secondary | ICD-10-CM | POA: Insufficient documentation

## 2014-03-28 NOTE — Assessment & Plan Note (Signed)
Gold C copd with ongoing acute flare Plan Start prednisone 10mg  Take 4 for three days 3 for three days 2 for three days 1 for three days and stop Start Breo one puff daily Start levaquin one daily til gone No other changes Return 6 weeks

## 2014-04-01 ENCOUNTER — Non-Acute Institutional Stay (SKILLED_NURSING_FACILITY): Payer: PRIVATE HEALTH INSURANCE | Admitting: Internal Medicine

## 2014-04-01 DIAGNOSIS — J4489 Other specified chronic obstructive pulmonary disease: Secondary | ICD-10-CM

## 2014-04-01 DIAGNOSIS — E1129 Type 2 diabetes mellitus with other diabetic kidney complication: Secondary | ICD-10-CM

## 2014-04-01 DIAGNOSIS — I2510 Atherosclerotic heart disease of native coronary artery without angina pectoris: Secondary | ICD-10-CM

## 2014-04-01 DIAGNOSIS — J449 Chronic obstructive pulmonary disease, unspecified: Secondary | ICD-10-CM

## 2014-04-01 DIAGNOSIS — E785 Hyperlipidemia, unspecified: Secondary | ICD-10-CM

## 2014-04-01 NOTE — Progress Notes (Signed)
PROGRESS NOTE  DATE: 04-01-14  FACILITY: Nursing Home Location: [facility]  LEVEL OF CARE: SNF (31)  Routine Visit  CHIEF COMPLAINT:  Manage COPD, diabetes mellitus and CAD  HISTORY OF PRESENT ILLNESS:  REASSESSMENT OF ONGOING PROBLEM(S):  CAD: The angina has been stable. The patient denies dyspnea on exertion, orthopnea,  palpitations and paroxysmal nocturnal dyspnea. Complains of chronic bilateral lower extremity swelling. No complications noted from the medication presently being used.  COPD: the COPD remains stable.  Pt denies sob, cough, wheezing or declining exercise tolerance.  No complications from the medications presently being used.  DM:pt's DM remains stable.  Pt denies polyuria, polydipsia, polyphagia, changes in vision or hypoglycemic episodes.  No complications noted from the medication presently being used.  Last hemoglobin A1c is: 5.5 in 4/14, in 10/14 hemoglobin A1c 5.7, in 1-15 hemoglobin A1c 6.2, in 7-15 hemoglobin A1c 6  PAST MEDICAL HISTORY : Reviewed.  No changes.  CURRENT MEDICATIONS: Reviewed per [facility]  REVIEW OF SYSTEMS:  GENERAL: no change in appetite, no fatigue, no weight changes, no fever, chills or weakness RESPIRATORY: no cough, SOB, DOE, wheezing, hemoptysis CARDIAC: no chest pain, or palpitations, complains of chronic bilateral lower extremity swelling GI: no abdominal pain, diarrhea, constipation, heart burn, nausea or vomiting  PHYSICAL EXAMINATION  VS:  See vital signs section  GENERAL: no acute distress, moderately obese body habitus EYES: conjunctivae normal, sclerae normal, normal eye lids NECK: supple, trachea midline, no neck masses, no thyroid tenderness, no thyromegaly LYMPHATICS: no LAN in the neck, no supraclavicular LAN RESPIRATORY: breathing is even & unlabored, BS CTAB CARDIAC: RRR, no murmur,no extra heart sounds,  +2 bilateral lower extremity edema GI: abdomen soft, normal BS, no masses, no  tenderness, no hepatomegaly, no splenomegaly PSYCHIATRIC: the patient is alert & oriented to person, affect & behavior appropriate  LABS/RADIOLOGY: 7-15 TIBC 205, serum iron level 24, iron saturation 12%, ferritin 397, hemoglobin 9.1, glucose 125, BUN 31, creatinine 1.87 otherwise  BMP normal 4-15 glucose 147, BUN 56, creatinine 2.7 otherwise BMP normal, hemoglobin 10.3, MCV 86, platelets 132, WBC 10.7, ferritin 191, liver profile normal, fasting lipid panel normal, percent saturation 13, TIBC 318, serum iron level 42 3-15 iron panel normal 12-14 hemoglobin 10, MCV 87, platelets 143, WBC 8.5, glucose 100, BUN 43, creatinine 1.95 otherwise CMP normal, triglycerides 200, HDL 38, total cholesterol 409, LDL 59  11-14 hemoglobin 9, MCV 89 otherwise CBC normal  10- 14 glucose 125, BUN 38, creatinine 2.08, otherwise BMP normal, WBC 11.5, hemoglobin 10.3, MCV 93, platelets 156  9-14 WBC 11.8, hemoglobin 8.3, MCV 85, platelets 136, BUN 54, creatinine 1.82 otherwise BMP normal, total protein 5.6, albumin 3.4 otherwise liver profile normal, triglycerides 152, HDL 36, total potential 1:30, HDL 64, TSH 1.82  7/14 WBC 14.8, normal being 13.8, platelets 104, BUN 96, creatinine 2.2 otherwise CMP normal, repeat labs: WBC 18.4, hemoglobin 12.9, platelets 104, glucose 229, BUN 101, creatinine 2.38, potassium 5.9 otherwise BMP normal, liver profile is normal 6/14 triglycerides 20, HDL 35 otherwise fasting lipid panel normal  ASSESSMENT/PLAN:  COPD-Levaquin and prednisone taper was started by pulmonologist for exacerbation. Start Tussionex 5 mL every 12 for 5 days. CAD-stable Diabetes mellitus with renal complications-well-controlled. Hyperlipidemia -well-controlled. Anemia of chronic kidney disease-stable. On iron. Chronic kidney disease stage IV-renal functions improved CHF-well compensated. GERD-stable. Osteoarthritis-pain well controlled. Constipation-well-controlled Hypokalemia --  well-controlled Gout-on allopurinol  CPT CODE: 81191  Newton PiggGayani Y. Kerry Doryasanayaka, MD [facility] [phone]

## 2014-04-05 ENCOUNTER — Ambulatory Visit: Payer: Medicare Other | Admitting: Critical Care Medicine

## 2014-05-01 ENCOUNTER — Non-Acute Institutional Stay (SKILLED_NURSING_FACILITY): Payer: PRIVATE HEALTH INSURANCE | Admitting: Internal Medicine

## 2014-05-01 DIAGNOSIS — E1129 Type 2 diabetes mellitus with other diabetic kidney complication: Secondary | ICD-10-CM

## 2014-05-01 DIAGNOSIS — E785 Hyperlipidemia, unspecified: Secondary | ICD-10-CM

## 2014-05-01 DIAGNOSIS — J449 Chronic obstructive pulmonary disease, unspecified: Secondary | ICD-10-CM

## 2014-05-01 DIAGNOSIS — I2510 Atherosclerotic heart disease of native coronary artery without angina pectoris: Secondary | ICD-10-CM

## 2014-05-03 NOTE — Progress Notes (Signed)
PROGRESS NOTE  DATE: 05-01-14  FACILITY: Nursing Home Location: [facility]  LEVEL OF CARE: SNF (31)  Routine Visit  CHIEF COMPLAINT:  Manage COPD, diabetes mellitus and CAD  HISTORY OF PRESENT ILLNESS:  REASSESSMENT OF ONGOING PROBLEM(S):  CAD: The angina has been stable. The patient denies dyspnea on exertion, orthopnea,  palpitations and paroxysmal nocturnal dyspnea. Complains of chronic bilateral lower extremity swelling. No complications noted from the medication presently being used.  COPD: the COPD remains stable.  Pt denies sob, cough, wheezing or declining exercise tolerance.  No complications from the medications presently being used.  DM:pt's DM remains stable.  Pt denies polyuria, polydipsia, polyphagia, changes in vision or hypoglycemic episodes.  No complications noted from the medication presently being used.  Last hemoglobin A1c is: 5.5 in 4/14, in 10/14 hemoglobin A1c 5.7, in 1-15 hemoglobin A1c 6.2, in 7-15 hemoglobin A1c 6  PAST MEDICAL HISTORY : Reviewed.  No changes.  CURRENT MEDICATIONS: Reviewed per [facility]  REVIEW OF SYSTEMS:  GENERAL: no change in appetite, no fatigue, no weight changes, no fever, chills or weakness RESPIRATORY: no cough, SOB, DOE, wheezing, hemoptysis CARDIAC: no chest pain, or palpitations, complains of chronic bilateral lower extremity swelling GI: no abdominal pain, diarrhea, constipation, heart burn, nausea or vomiting  PHYSICAL EXAMINATION  VS:  See vital signs section  GENERAL: no acute distress, moderately obese body habitus EYES: conjunctivae normal, sclerae normal, normal eye lids NECK: supple, trachea midline, no neck masses, no thyroid tenderness, no thyromegaly LYMPHATICS: no LAN in the neck, no supraclavicular LAN RESPIRATORY: breathing is even & unlabored, BS CTAB CARDIAC: RRR, no murmur,no extra heart sounds,  +2 bilateral lower extremity edema GI: abdomen soft, normal BS, no masses, no  tenderness, no hepatomegaly, no splenomegaly PSYCHIATRIC: the patient is alert & oriented to person, affect & behavior appropriate  LABS/RADIOLOGY: 7-15 TIBC 205, serum iron level 24, iron saturation 12%, ferritin 397, hemoglobin 9.1, glucose 125, BUN 31, creatinine 1.87 otherwise  BMP normal, TSH 1.23 4-15 glucose 147, BUN 56, creatinine 2.7 otherwise BMP normal, hemoglobin 10.3, MCV 86, platelets 132, WBC 10.7, ferritin 191, liver profile normal, fasting lipid panel normal, percent saturation 13, TIBC 318, serum iron level 42 3-15 iron panel normal 12-14 hemoglobin 10, MCV 87, platelets 143, WBC 8.5, glucose 100, BUN 43, creatinine 1.95 otherwise CMP normal, triglycerides 200, HDL 38, total cholesterol 161, LDL 59  11-14 hemoglobin 9, MCV 89 otherwise CBC normal  10- 14 glucose 125, BUN 38, creatinine 2.08, otherwise BMP normal, WBC 11.5, hemoglobin 10.3, MCV 93, platelets 156  9-14 WBC 11.8, hemoglobin 8.3, MCV 85, platelets 136, BUN 54, creatinine 1.82 otherwise BMP normal, total protein 5.6, albumin 3.4 otherwise liver profile normal, triglycerides 152, HDL 36, total potential 1:30, HDL 64, TSH 1.82  7/14 WBC 14.8, normal being 13.8, platelets 104, BUN 96, creatinine 2.2 otherwise CMP normal, repeat labs: WBC 18.4, hemoglobin 12.9, platelets 104, glucose 229, BUN 101, creatinine 2.38, potassium 5.9 otherwise BMP normal, liver profile is normal 6/14 triglycerides 20, HDL 35 otherwise fasting lipid panel normal  ASSESSMENT/PLAN:  COPD-compensated CAD-stable Diabetes mellitus with renal complications-well-controlled. Lantus was discontinued. Hyperlipidemia -well-controlled. Anemia of chronic kidney disease-stable. On iron. Chronic kidney disease stage IV-renal functions improved CHF-well compensated. GERD-stable. Osteoarthritis-pain well controlled. Constipation-well-controlled Hypokalemia -- well-controlled Gout-on allopurinol Renovascular hypertension-last blood pressure elevated.  We'll review a log. Check liver profile  CPT CODE: 09604  [facility]  Hubbard HartshornY. Klay Sobotka, MD [facility] [phone]

## 2014-05-06 ENCOUNTER — Encounter: Payer: Self-pay | Admitting: *Deleted

## 2014-05-15 ENCOUNTER — Encounter: Payer: Self-pay | Admitting: Critical Care Medicine

## 2014-05-15 ENCOUNTER — Ambulatory Visit (INDEPENDENT_AMBULATORY_CARE_PROVIDER_SITE_OTHER): Payer: Medicare Other | Admitting: Critical Care Medicine

## 2014-05-15 VITALS — BP 160/90 | HR 69 | Temp 97.7°F | Ht 59.0 in | Wt 148.8 lb

## 2014-05-15 DIAGNOSIS — Z23 Encounter for immunization: Secondary | ICD-10-CM

## 2014-05-15 DIAGNOSIS — J449 Chronic obstructive pulmonary disease, unspecified: Secondary | ICD-10-CM

## 2014-05-15 NOTE — Progress Notes (Signed)
Subjective:    Patient ID: [name], female    DOB: 1928/05/31, 80 y.o.   MRN: [id number]  HPI  80 y.o.F quit smoking in 1987 and chronic renal insufficiency with coronary artery disease. The patient had history of congestive heart failure and diabetes. The patient has been hospitalized on numerous occasions for respiratory failure and pneumonia. Patient currently is residing in a nursing care facility.   05/15/2014 Chief Complaint  Patient presents with  . 6 wk follow up    SOB at baseline.  Increased cough x 1-2 wks with thick, white mucus.  No chest tightness/pain.  Mucus now is white, dyspnea is at baseline.   Pt denies any significant sore throat, nasal congestion or excess secretions, fever, chills, sweats, unintended weight loss, pleurtic or exertional chest pain, orthopnea PND, or leg swelling Pt denies any increase in rescue therapy over baseline, denies waking up needing it or having any early am or nocturnal exacerbations of coughing/wheezing/or dyspnea. Pt also denies any obvious fluctuation in symptoms with  weather or environmental change or other alleviating or aggravating factors  Objective:    Review of Systems  Constitutional:   No  weight loss, night sweats,  Fevers, chills, fatigue, lassitude. HEENT:   No headaches,  Difficulty swallowing,  Tooth/dental problems,  Sore throat,                No sneezing, itching, ear ache, nasal congestion, post nasal drip,   CV:  No chest pain,  Orthopnea, PND, swelling in lower extremities, anasarca, dizziness, palpitations  GI  No heartburn, indigestion, abdominal pain, nausea, vomiting, diarrhea, change in bowel habits, loss of appetite  Resp: Notes  shortness of breath with exertion not  at rest.  No excess mucus, no productive cough,  No non-productive cough,  No coughing up of blood.  No change in color of mucus.  No wheezing.  No chest wall deformity  Skin: no rash or lesions.  GU: no dysuria, change in color of  urine, no urgency or frequency.  No flank pain.  MS:  No joint pain or swelling.  No decreased range of motion.  No back pain.  Psych:  No change in mood or affect. No depression or anxiety.  No memory loss.     Objective:   Physical Exam  Filed Vitals:   05/15/14 1136  BP: 160/90  Pulse: 69  Temp: 97.7 F (36.5 C)  TempSrc: Oral  Height:  (1.499 m)  Weight: 148 lb 12.8 oz (67.495 kg)  SpO2: 95%    Gen: Pleasant, well-nourished, in no distress,  normal affect  ENT: No lesions,  mouth clear,  oropharynx clear, no postnasal drip  Neck: No JVD, no TMG, no carotid bruits  Lungs: No use of accessory muscles, no dullness to percussion, distant breath sounds  Cardiovascular: RRR, heart sounds normal, no murmur or gallops, 3+ peripheral edema  Abdomen: soft and NT, no HSM,  BS normal  Musculoskeletal: No deformities, no cyanosis or clubbing  Neuro: alert, non focal  Skin: Warm, no lesions or rashes        Assessment & Plan:   COPD, Gold C Gold stage C. COPD with recent exacerbation now resolved The patient is on a inhaled steroid and nebulized steroid at the same time Plan Stop budesonide Stay on Breo one puff daily Stay on Spiriva Stay on as needed albuterol in nebulizer Return 2 months Flu vaccine was given     Updated Medication List Outpatient  Encounter Prescriptions as of 05/15/2014  Medication Sig  . albuterol (PROVENTIL) (2.5 MG/3ML) 0.083% nebulizer solution Take 3 mLs (2.5 mg total) by nebulization every 4 (four) hours while awake. And every 4 hours as needed for wheezing  . allopurinol (ZYLOPRIM) 100 MG tablet Take 100 mg by mouth 2 (two) times daily.    Marland Kitchen aspirin 81 MG tablet Take 81 mg by mouth daily.    Marland Kitchen atenolol (TENORMIN) 50 MG tablet Take 50 mg by mouth daily. Hold for SBP <110, HR < 60  . chlorpheniramine-HYDROcodone (TUSSIONEX) 10-8 MG/5ML LQCR Take 5 mLs by mouth at bedtime as needed.  . Cholecalciferol (VITAMIN D3) 50000 UNITS CAPS  Take 1 tablet by mouth once a week.   Marland Kitchen dextromethorphan (DELSYM) 30 MG/5ML liquid Take 60 mg by mouth every 12 (twelve) hours as needed.   Marland Kitchen epoetin alfa (EPOGEN,PROCRIT) 16109 UNIT/ML injection Inject 10,000 Units into the skin once a week.    . ferrous sulfate 325 (65 FE) MG tablet Take 325 mg by mouth daily.   . Fluticasone Furoate-Vilanterol (BREO ELLIPTA) 100-25 MCG/INH AEPB Inhale 1 puff into the lungs daily.  . insulin aspart (NOVOLOG) 100 UNIT/ML injection Inject 0-20 Units into the skin 3 (three) times daily with meals.  . insulin glargine (LANTUS) 100 UNIT/ML injection Inject 12 Units into the skin at bedtime.   . lidocaine (LIDODERM) 5 % Place 1 patch onto the skin daily. Remove & Discard patch within 12 hours or as directed by MD  . [name] 1 G capsule Take 1 tablet by mouth daily. *Do Not Crush**  . omeprazole (PRILOSEC) 20 MG capsule Take 20 mg by mouth daily. * Take on an Empty stomach*  . phenazopyridine (PYRIDIUM) 100 MG tablet Take 100 mg by mouth every 6 (six) hours as needed (dysuria).  . polyethylene glycol (MIRALAX / GLYCOLAX) packet Take 17 g by mouth every 3 (three) days.  . rosuvastatin (CRESTOR) 20 MG tablet Take 20 mg by mouth daily.    Marland Kitchen senna (SENOKOT) 8.6 MG tablet Take 1 tablet by mouth daily.  Marland Kitchen tiotropium (SPIRIVA) 18 MCG inhalation capsule Place 18 mcg into inhaler and inhale daily.    Marland Kitchen torsemide (DEMADEX) 20 MG tablet Take 2 tablets (40 mg total) by mouth daily.  . [DISCONTINUED] budesonide (PULMICORT) 0.25 MG/2ML nebulizer solution Take 0.25 mg by nebulization 4 (four) times daily.

## 2014-05-15 NOTE — Patient Instructions (Signed)
Stop budesonide Stay on Breo one puff daily Stay on Spiriva Stay on as needed albuterol in nebulizer Return 2 months Flu vaccine was given

## 2014-05-16 ENCOUNTER — Telehealth: Payer: Self-pay | Admitting: Critical Care Medicine

## 2014-05-16 NOTE — Assessment & Plan Note (Signed)
Gold stage C. COPD with recent exacerbation now resolved The patient is on a inhaled steroid and nebulized steroid at the same time Plan Stop budesonide Stay on Breo one puff daily Stay on Spiriva Stay on as needed albuterol in nebulizer Return 2 months Flu vaccine was given

## 2014-05-16 NOTE — Telephone Encounter (Signed)
Pt was seen by PW yesterday with the following instructions:  Patient Instructions     Stop budesonide  Stay on Breo one puff daily  Stay on Spiriva  Stay on as needed albuterol in nebulizer  Return 2 months  Flu vaccine was given   -----  Spoke with Ms. Suits, pt's friend who accompanies pt during visits.  Per Ms. Suits, the head nurse spoke with pt regarding neb tx.  Pt feels there may be some confusion regarding the nebs -- reports pt was advised she will only be getting 1 breathing tx per day now according to orders given by PW.  Requesting I call Mapel Lucas Mallow to clarify.    [address] [place], [phone].  Spoke with [employer].  They have budesonide d/c'd and albuterol changed from scheduled q4h while awake AND q4h prn to PRN ONLY according to patient instructions from yesterday's visit. Prior to yesterday, order was for albuterol to be q4h while awake and q4h prn.  Advised Toky I would clarify albuterol neb order with Dr. Delford Field as pt's current med list in epic is still for q4h while awake and q4h prn.  Spoke with Dr. Delford Field to clarify med orders.  Per Dr. Delford Field:  Budesonide needs to be d/c'd.  Pt needs to stay on Breo and Spiriva. Give order for albuterol neb to be SCHEDULED q4h while awake AND q4h prn.    Spoke with [facility] regarding med orders given today by Dr. Delford Field.  She verbalized understanding and will need orders faxed to [phone].  Orders written on rx pad, faxed, and placed in scan folder.  Toky aware.  Ms. Anne Hahn aware.

## 2014-05-17 ENCOUNTER — Telehealth: Payer: Self-pay | Admitting: Critical Care Medicine

## 2014-05-17 NOTE — Telephone Encounter (Signed)
i agree. 

## 2014-05-17 NOTE — Telephone Encounter (Signed)
Mrs. Anne Hahn is a friend of [name],  Needs to talk to nurse about nebulizer. See yesterday's phone note.

## 2014-05-17 NOTE — Telephone Encounter (Signed)
Mrs Anne Hahn friend states that she is having a difficult time breathing & that the DME has discontinued pt's nebulizer & removing it from her room.  [phone].    Antionette Fairy

## 2014-05-17 NOTE — Telephone Encounter (Signed)
attmepted to call the pts friend and the line is busy.  Will try back later.

## 2014-05-21 NOTE — Telephone Encounter (Signed)
LMTCB for [name].

## 2014-05-21 NOTE — Telephone Encounter (Signed)
Mrs. [name] returned call. Pt is now getting her nebulizer. It was late getting in the books at nursing home to keep her nebulizer.  Nothing further is needed at this time.

## 2014-05-27 ENCOUNTER — Non-Acute Institutional Stay (SKILLED_NURSING_FACILITY): Payer: PRIVATE HEALTH INSURANCE | Admitting: Internal Medicine

## 2014-05-27 DIAGNOSIS — R300 Dysuria: Secondary | ICD-10-CM

## 2014-05-27 DIAGNOSIS — E1129 Type 2 diabetes mellitus with other diabetic kidney complication: Secondary | ICD-10-CM

## 2014-05-27 DIAGNOSIS — J449 Chronic obstructive pulmonary disease, unspecified: Secondary | ICD-10-CM

## 2014-05-27 DIAGNOSIS — I2510 Atherosclerotic heart disease of native coronary artery without angina pectoris: Secondary | ICD-10-CM

## 2014-05-27 NOTE — Progress Notes (Signed)
PROGRESS NOTE  DATE: 05-27-14  FACILITY: Nursing Home Location: [facility]  LEVEL OF CARE: SNF (31)  Routine Visit  CHIEF COMPLAINT:  Manage COPD, diabetes mellitus and CAD  HISTORY OF PRESENT ILLNESS:  REASSESSMENT OF ONGOING PROBLEM(S):  CAD: The angina has been stable. The patient denies dyspnea on exertion, orthopnea,  palpitations and paroxysmal nocturnal dyspnea. Complains of chronic bilateral lower extremity swelling. No complications noted from the medication presently being used.  COPD: the COPD remains stable.  Pt denies sob, cough, wheezing or declining exercise tolerance.  No complications from the medications presently being used.  DM:pt's DM remains stable.  Pt denies polyuria, polydipsia, polyphagia, changes in vision or hypoglycemic episodes.  No complications noted from the medication presently being used.  Last hemoglobin A1c is: 5.5 in 4/14, in 10/14 hemoglobin A1c 5.7, in 1-15 hemoglobin A1c 6.2, in 7-15 hemoglobin A1c 6  PAST MEDICAL HISTORY : Reviewed.  No changes.  CURRENT MEDICATIONS: Reviewed per [facility]  REVIEW OF SYSTEMS:  GENERAL: no change in appetite, no fatigue, no weight changes, no fever, chills or weakness RESPIRATORY: no cough, SOB, DOE, wheezing, hemoptysis CARDIAC: no chest pain, or palpitations, complains of chronic bilateral lower extremity swelling GI: no abdominal pain, diarrhea, constipation, heart burn, nausea or vomiting  PHYSICAL EXAMINATION  VS:  See vital signs section  GENERAL: no acute distress, moderately obese body habitus EYES: conjunctivae normal, sclerae normal, normal eye lids NECK: supple, trachea midline, no neck masses, no thyroid tenderness, no thyromegaly LYMPHATICS: no LAN in the neck, no supraclavicular LAN RESPIRATORY: breathing is even & unlabored, BS CTAB CARDIAC: RRR, no murmur,no extra heart sounds,  +2 bilateral lower extremity edema GI: abdomen soft, normal BS, no masses, no  tenderness, no hepatomegaly, no splenomegaly PSYCHIATRIC: the patient is alert & oriented to person, affect & behavior appropriate  LABS/RADIOLOGY:  8-15 total protein 5.8, albumin 3.4 otherwise liver profile normal, BUN 34, creatinine 1.92 otherwise BMP normal, ferritin 301, hemoglobin 9.1 7-15 TIBC 205, serum iron level 24, iron saturation 12%, ferritin 397, hemoglobin 9.1, glucose 125, BUN 31, creatinine 1.87 otherwise  BMP normal, TSH 1.23 4-15 glucose 147, BUN 56, creatinine 2.7 otherwise BMP normal, hemoglobin 10.3, MCV 86, platelets 132, WBC 10.7, ferritin 191, liver profile normal, fasting lipid panel normal, percent saturation 13, TIBC 318, serum iron level 42 3-15 iron panel normal 12-14 hemoglobin 10, MCV 87, platelets 143, WBC 8.5, glucose 100, BUN 43, creatinine 1.95 otherwise CMP normal, triglycerides 200, HDL 38, total cholesterol 161, LDL 59  11-14 hemoglobin 9, MCV 89 otherwise CBC normal  10- 14 glucose 125, BUN 38, creatinine 2.08, otherwise BMP normal, WBC 11.5, hemoglobin 10.3, MCV 93, platelets 156  9-14 WBC 11.8, hemoglobin 8.3, MCV 85, platelets 136, BUN 54, creatinine 1.82 otherwise BMP normal, total protein 5.6, albumin 3.4 otherwise liver profile normal, triglycerides 152, HDL 36, total potential 1:30, HDL 64, TSH 1.82  7/14 WBC 14.8, normal being 13.8, platelets 104, BUN 96, creatinine 2.2 otherwise CMP normal, repeat labs: WBC 18.4, hemoglobin 12.9, platelets 104, glucose 229, BUN 101, creatinine 2.38, potassium 5.9 otherwise BMP normal, liver profile is normal 6/14 triglycerides 20, HDL 35 otherwise fasting lipid panel normal  ASSESSMENT/PLAN:  COPD-compensated CAD-stable Diabetes mellitus with renal complications-well-controlled.  Hyperlipidemia -well-controlled. Anemia of chronic kidney disease-stable. On iron. Chronic kidney disease stage IV-renal functions increased. Monitor. CHF-well compensated. GERD-stable. Osteoarthritis-pain well  controlled. Constipation-well-controlled Hypokalemia --  well-controlled Gout-on allopurinol Renovascular hypertension-well controlled Dysuria and urinary hesitancy-new problems. Check UA, culture and sensitivities.  CPT CODE: 69629  Newton Pigg. Kerry Dory, MD [facility] [phone]

## 2014-06-17 ENCOUNTER — Non-Acute Institutional Stay (SKILLED_NURSING_FACILITY): Payer: PRIVATE HEALTH INSURANCE | Admitting: Internal Medicine

## 2014-06-17 DIAGNOSIS — K921 Melena: Secondary | ICD-10-CM

## 2014-06-20 NOTE — Progress Notes (Signed)
Patient ID: [name], female   DOB: 1928-07-07, 80 y.o.   MRN: [id number]           PROGRESS NOTE  DATE: 06/17/2014            [facility]  LEVEL OF CARE: SNF (31)  Acute Visit  CHIEF COMPLAINT:  Manage melena.    HISTORY OF PRESENT ILLNESS: I was requested by the staff to assess the patient regarding above problem(s):  Staff report that on 06/12/2014, the patient had tarry-colored stool and external hemorrhoids were observed and some bright red blood noted.  The patient was started on Anusol cream.  She denies further hematochezia or melena.  She denies abdominal pain, nausea or vomiting.    PAST MEDICAL HISTORY : Reviewed.  No changes/see problem list  CURRENT MEDICATIONS: Reviewed per MAR/see medication list  REVIEW OF SYSTEMS:  GENERAL: no change in appetite, no fatigue, no weight changes, no fever, chills or weakness RESPIRATORY: no cough, SOB, DOE,, wheezing, hemoptysis CARDIAC: no chest pain or palpitations;  chronic lower extremity swelling      GI: no abdominal pain, diarrhea, constipation, heart burn, nausea or vomiting  PHYSICAL EXAMINATION  VS: see VS section  GENERAL: no acute distress, normal body habitus NECK: supple, trachea midline, no neck masses, no thyroid tenderness, no thyromegaly RESPIRATORY: breathing is even & unlabored, BS CTAB CARDIAC: RRR, no murmur,no extra heart sounds, +2 bilateral lower extremity edema      GI: abdomen soft, normal BS, no masses, no tenderness, no hepatomegaly, no splenomegaly PSYCHIATRIC: the patient is alert & oriented to person, affect & behavior appropriate  ASSESSMENT/PLAN:  Lower GI bleeding.  Likely secondary to hemorrhoids.  Since initiation of Anusol cream, no symptoms observed.  Patient is asymptomatic.  We will check a hemoglobin level.    CPT CODE: 9562199308, [id number]               Angela CoxGayani Y Dasanayaka, MD [facility] [phone]

## 2014-06-24 ENCOUNTER — Non-Acute Institutional Stay (SKILLED_NURSING_FACILITY): Payer: PRIVATE HEALTH INSURANCE | Admitting: Internal Medicine

## 2014-06-24 DIAGNOSIS — E1122 Type 2 diabetes mellitus with diabetic chronic kidney disease: Secondary | ICD-10-CM

## 2014-06-24 DIAGNOSIS — J438 Other emphysema: Secondary | ICD-10-CM

## 2014-06-24 DIAGNOSIS — E785 Hyperlipidemia, unspecified: Secondary | ICD-10-CM

## 2014-06-24 DIAGNOSIS — N189 Chronic kidney disease, unspecified: Secondary | ICD-10-CM

## 2014-06-24 DIAGNOSIS — I2510 Atherosclerotic heart disease of native coronary artery without angina pectoris: Secondary | ICD-10-CM

## 2014-06-25 DIAGNOSIS — J438 Other emphysema: Secondary | ICD-10-CM | POA: Insufficient documentation

## 2014-06-25 DIAGNOSIS — E785 Hyperlipidemia, unspecified: Secondary | ICD-10-CM | POA: Insufficient documentation

## 2014-06-25 NOTE — Progress Notes (Signed)
PROGRESS NOTE  DATE: 06-24-14  FACILITY: Nursing Home [facility]  LEVEL OF CARE: SNF (31)  Routine Visit  CHIEF COMPLAINT:  Manage COPD, diabetes mellitus and CAD  HISTORY OF PRESENT ILLNESS:  REASSESSMENT OF ONGOING PROBLEM(S):  CAD: The angina has been stable. The patient denies dyspnea on exertion, orthopnea,  palpitations and paroxysmal nocturnal dyspnea. Complains of chronic bilateral lower extremity swelling. No complications noted from the medication presently being used.  COPD: the COPD remains stable.  Pt denies sob, cough, wheezing or declining exercise tolerance.  No complications from the medications presently being used.  DM:pt's DM remains stable.  Pt denies polyuria, polydipsia, polyphagia, changes in vision or hypoglycemic episodes.  No complications noted from the medication presently being used.  Last hemoglobin A1c is: 5.5 in 4/14, in 10/14 hemoglobin A1c 5.7, in 1-15 hemoglobin A1c 6.2, in 7-15 hemoglobin A1c 6, in 10-15 hemoglobin A1c 5.6.  PAST MEDICAL HISTORY : Reviewed.  No changes.  [facility]  REVIEW OF SYSTEMS:  GENERAL: no change in appetite, no fatigue, no weight changes, no fever, chills or weakness RESPIRATORY: no cough, SOB, DOE, wheezing, hemoptysis CARDIAC: no chest pain, or palpitations, complains of chronic bilateral lower extremity swelling GI: no abdominal pain, diarrhea, constipation, heart burn, nausea or vomiting  PHYSICAL EXAMINATION  VS:  See vital signs section  GENERAL: no acute distress, moderately obese body habitus EYES: conjunctivae normal, sclerae normal, normal eye lids NECK: supple, trachea midline, no neck masses, no thyroid tenderness, no thyromegaly LYMPHATICS: no LAN in the neck, no supraclavicular LAN RESPIRATORY: breathing is even & unlabored, BS CTAB CARDIAC: RRR, no murmur,no extra heart sounds,  +2 bilateral lower extremity edema GI: abdomen  soft, normal BS, no masses, no tenderness, no hepatomegaly, no splenomegaly PSYCHIATRIC: the patient is alert & oriented to person, affect & behavior appropriate  LABS/RADIOLOGY: 10-15 hemoglobin 9.8, MCV 85 otherwise CBC normal, glucose 129, BUN 44, creatinine 2.04 otherwise BMP normal, ferritin 371, FLP normal, TSH 1.87 9-15 WBC 13.5 otherwise CBC normal, glucose 131, BUN 73, creatinine 2.6 otherwise BMP normal  8-15 total protein 5.8, albumin 3.4 otherwise liver profile normal, BUN 34, creatinine 1.92 otherwise BMP normal, ferritin 301, hemoglobin 9.1 7-15 TIBC 205, serum iron level 24, iron saturation 12%, ferritin 397, hemoglobin 9.1, glucose 125, BUN 31, creatinine 1.87 otherwise  BMP normal, TSH 1.23 4-15 glucose 147, BUN 56, creatinine 2.7 otherwise BMP normal, hemoglobin 10.3, MCV 86, platelets 132, WBC 10.7, ferritin 191, liver profile normal, fasting lipid panel normal, percent saturation 13, TIBC 318, serum iron level 42 3-15 iron panel normal 12-14 hemoglobin 10, MCV 87, platelets 143, WBC 8.5, glucose 100, BUN 43, creatinine 1.95 otherwise CMP normal, triglycerides 200, HDL 38, total cholesterol 409137, LDL 59  11-14 hemoglobin 9, MCV 89 otherwise CBC normal  10- 14 glucose 125, BUN 38, creatinine 2.08, otherwise BMP normal, WBC 11.5, hemoglobin 10.3, MCV 93, platelets 156  9-14 WBC 11.8, hemoglobin 8.3, MCV 85, platelets 136, BUN 54, creatinine 1.82 otherwise BMP normal, total protein 5.6, albumin 3.4 otherwise liver profile normal, triglycerides 152, HDL 36, total potential 1:30, HDL 64, TSH 1.82  7/14 WBC 14.8, normal being 13.8, platelets 104, BUN 96, creatinine 2.2 otherwise CMP normal, repeat labs: WBC 18.4, hemoglobin 12.9, platelets 104, glucose 229, BUN 101, creatinine 2.38, potassium 5.9 otherwise BMP normal, liver profile is normal 6/14 triglycerides 20, HDL 35  otherwise fasting lipid panel normal  ASSESSMENT/PLAN:  COPD-compensated CAD-stable Diabetes mellitus with  renal complications-well-controlled.  Hyperlipidemia -well-controlled. Anemia of chronic kidney disease-stable. On iron. Chronic kidney disease stage IV-renal functions improved. Monitor. CHF-well compensated. GERD-stable. Osteoarthritis-pain well controlled. Constipation-well-controlled Hypokalemia -- well-controlled Gout-on allopurinol Renovascular hypertension-well controlled  CPT CODE: [id number]  Newton PiggGayani Y. Kerry Doryasanayaka, MD [facility] [phone]

## 2014-06-26 ENCOUNTER — Non-Acute Institutional Stay (SKILLED_NURSING_FACILITY): Payer: PRIVATE HEALTH INSURANCE | Admitting: Internal Medicine

## 2014-06-26 DIAGNOSIS — D631 Anemia in chronic kidney disease: Secondary | ICD-10-CM

## 2014-06-26 DIAGNOSIS — N189 Chronic kidney disease, unspecified: Secondary | ICD-10-CM

## 2014-07-02 NOTE — Progress Notes (Signed)
Patient ID: [name], female   DOB: 1928-03-18, 80 y.o.   MRN: [id number]           PROGRESS NOTE  DATE: 06/26/2014         [facility]  LEVEL OF CARE: SNF (31)  Acute Visit  CHIEF COMPLAINT:  Manage anemia of chronic kidney disease.    HISTORY OF PRESENT ILLNESS: I was requested by the staff to assess the patient regarding above problem(s):  ANEMIA: The anemia has been stable. The patient denies fatigue, melena or hematochezia. No complications from the medications currently being used.  On 06/21/2014:  Hemoglobin 9.7.  On 06/13/2014:  Hemoglobin 10.8.    PAST MEDICAL HISTORY : Reviewed.  No changes/see problem list  CURRENT MEDICATIONS: Reviewed per MAR/see medication list  PHYSICAL EXAMINATION  VS: see VS section  GENERAL: no acute distress, normal body habitus RESPIRATORY: breathing is even & unlabored, BS CTAB CARDIAC: RRR, no murmur,no extra heart sounds, +2 bilateral lower extremity edema     ASSESSMENT/PLAN:  Anemia of chronic kidney disease.  Hemoglobin stable.    CPT CODE: [id number]             Angela CoxGayani Y Sahian Kerney, MD [facility] [phone]

## 2014-07-10 ENCOUNTER — Encounter: Payer: Self-pay | Admitting: *Deleted

## 2014-08-05 ENCOUNTER — Ambulatory Visit: Payer: Medicare Other | Admitting: Critical Care Medicine

## 2014-08-07 ENCOUNTER — Ambulatory Visit: Payer: Medicare Other | Admitting: Critical Care Medicine

## 2014-08-12 ENCOUNTER — Ambulatory Visit: Payer: Medicare Other | Admitting: Adult Health

## 2014-08-28 ENCOUNTER — Telehealth: Payer: Self-pay | Admitting: Critical Care Medicine

## 2014-08-28 NOTE — Telephone Encounter (Signed)
Called and spoke with dorca and she stated that she will come by in the morning and pick this up.  Nothing further is needed.

## 2014-09-02 ENCOUNTER — Encounter: Payer: Self-pay | Admitting: Critical Care Medicine

## 2014-09-02 ENCOUNTER — Telehealth: Payer: Self-pay | Admitting: Critical Care Medicine

## 2014-09-02 ENCOUNTER — Ambulatory Visit (INDEPENDENT_AMBULATORY_CARE_PROVIDER_SITE_OTHER): Payer: Medicare Other | Admitting: Critical Care Medicine

## 2014-09-02 ENCOUNTER — Ambulatory Visit (INDEPENDENT_AMBULATORY_CARE_PROVIDER_SITE_OTHER)
Admission: RE | Admit: 2014-09-02 | Discharge: 2014-09-02 | Disposition: A | Discharge status: Home / Self Care | Payer: Medicare Other | Source: Ambulatory Visit | Attending: Critical Care Medicine | Admitting: Critical Care Medicine

## 2014-09-02 VITALS — BP 120/70 | HR 79 | Temp 97.9°F | Ht 59.0 in | Wt 140.0 lb

## 2014-09-02 DIAGNOSIS — J189 Pneumonia, unspecified organism: Secondary | ICD-10-CM

## 2014-09-02 DIAGNOSIS — J449 Chronic obstructive pulmonary disease, unspecified: Secondary | ICD-10-CM

## 2014-09-02 MED ORDER — HYDROCOD POLST-CHLORPHEN POLST 10-8 MG/5ML PO LQCR: 5.0000 mL | Freq: Two times a day (BID) | ORAL | Status: DC | PRN

## 2014-09-02 MED ORDER — PREDNISONE 10 MG PO TABS: ORAL_TABLET | ORAL | Status: DC

## 2014-09-02 MED ORDER — CLARITHROMYCIN 500 MG PO TABS: 500.0000 mg | ORAL_TABLET | Freq: Two times a day (BID) | ORAL | Status: DC

## 2014-09-02 MED ORDER — METHYLPREDNISOLONE ACETATE 80 MG/ML IJ SUSP
120.0000 mg | Freq: Once | INTRAMUSCULAR | Status: AC
  Administered 2014-09-02: 120 mg via INTRAMUSCULAR

## 2014-09-02 NOTE — Telephone Encounter (Signed)
[facility] and [facility] are asking for a copy of cxr results. Results faxed to number given. Nothing further needed. Carron CurieJennifer Castillo, CMA

## 2014-09-02 NOTE — Assessment & Plan Note (Signed)
Copd with chronic bronchitis with recent PNA and flare Plan A depomedrol injection was given 120mg   Stop levaquin  Start Biaxin one twice daily for 10days Start Prednisone 10mg  Take 4 for three days 3 for three days 2 for three days 1 for three days and stop

## 2014-09-02 NOTE — Telephone Encounter (Signed)
Error.Stanley A Dalton ° °

## 2014-09-02 NOTE — Patient Instructions (Signed)
A depomedrol injection was given 120mg   Stop levaquin  Start Biaxin one twice daily for 10days Start Prednisone 10mg  Take 4 for three days 3 for three days 2 for three days 1 for three days and stop A Chest xray was obtained Stay on inhalers unchanged Return 3 months

## 2014-09-02 NOTE — Progress Notes (Signed)
Subjective:    Patient ID: [name], female    DOB: 11/11/1927, 80 y.o.   MRN: [id number]  HPI 80 y.o.F quit smoking in 1987 and chronic renal insufficiency with coronary artery disease. The patient had history of congestive heart failure and diabetes. The patient has been hospitalized on numerous occasions for respiratory failure and pneumonia. Patient currently is residing in a nursing care facility.    09/02/2014  Chief Complaint  Patient presents with  . Follow-up    pt c/o cough, sob, wheezing and chest tightness.  Coughing now and wheezing.  Mucus is thick, sl yellow.  ABX has not helped as of yet.  Notes more dyspnea.  Notes some nasal drainage.   Pt denies any significant sore throat, nasal congestion or excess secretions, fever, chills, sweats, unintended weight loss, pleurtic or exertional chest pain, orthopnea PND, or leg swelling Pt denies any increase in rescue therapy over baseline, +++ waking up needing it or having any early am or nocturnal exacerbations of coughing/wheezing/or dyspnea. Pt also denies any obvious fluctuation in symptoms with  weather or environmental change or other alleviating or aggravating factors    Objective:    Review of Systems Constitutional:   No  weight loss, night sweats,  Fevers, chills, fatigue, lassitude. HEENT:   No headaches,  Difficulty swallowing,  Tooth/dental problems,  Sore throat,                No sneezing, itching, ear ache, nasal congestion, post nasal drip,   CV:  No chest pain,  Orthopnea, PND, swelling in lower extremities, anasarca, dizziness, palpitations  GI  No heartburn, indigestion, abdominal pain, nausea, vomiting, diarrhea, change in bowel habits, loss of appetite  Resp: Notes  shortness of breath with exertion not  at rest.  No excess mucus, no productive cough,  No non-productive cough,  No coughing up of blood.  No change in color of mucus.  No wheezing.  No chest wall deformity  Skin: no rash or  lesions.  GU: no dysuria, change in color of urine, no urgency or frequency.  No flank pain.  MS:  No joint pain or swelling.  No decreased range of motion.  No back pain.  Psych:  No change in mood or affect. No depression or anxiety.  No memory loss.     Objective:   Physical Exam Filed Vitals:   09/02/14 1024  BP: 120/70  Pulse: 79  Temp: 97.9 F (36.6 C)  TempSrc: Oral  Height: 4\' 11"  (1.499 m)  Weight: 63.504 kg (140 lb)  SpO2: 100%    Gen: Pleasant, well-nourished, in no distress,  normal affect  ENT: No lesions,  mouth clear,  oropharynx clear, no postnasal drip  Neck: No JVD, no TMG, no carotid bruits  Lungs: No use of accessory muscles, no dullness to percussion, distant breath sounds  Cardiovascular: RRR, heart sounds normal, no murmur or gallops, 3+ peripheral edema  Abdomen: soft and NT, no HSM,  BS normal  Musculoskeletal: No deformities, no cyanosis or clubbing  Neuro: alert, non focal  Skin: Warm, no lesions or rashes        Assessment & Plan:   COPD with chronic bronchitis Copd with chronic bronchitis with recent PNA and flare Plan A depomedrol injection was given 120mg   Stop levaquin  Start Biaxin one twice daily for 10days Start Prednisone 10mg  Take 4 for three days 3 for three days 2 for three days 1 for three days and stop  Updated Medication List Outpatient Encounter Prescriptions as of 09/02/2014  Medication Sig  . albuterol (PROVENTIL) (2.5 MG/3ML) 0.083% nebulizer solution Take 3 mLs (2.5 mg total) by nebulization every 4 (four) hours while awake. And every 4 hours as needed for wheezing  . allopurinol (ZYLOPRIM) 100 MG tablet Take 100 mg by mouth 2 (two) times daily.    Marland Kitchen. aspirin 81 MG tablet Take 81 mg by mouth daily.    Marland Kitchen. atenolol (TENORMIN) 50 MG tablet Take 50 mg by mouth daily. Hold for SBP <110, HR < 60  . Cholecalciferol (VITAMIN D3) 50000 UNITS CAPS Take 1 tablet by mouth once a week.   Marland Kitchen. epoetin alfa  (EPOGEN,PROCRIT) 9147810000 UNIT/ML injection Inject 10,000 Units into the skin once a week.    . ferrous sulfate 325 (65 FE) MG tablet Take 325 mg by mouth daily.   . Fluticasone Furoate-Vilanterol (BREO ELLIPTA) 100-25 MCG/INH AEPB Inhale 1 puff into the lungs daily.  . insulin aspart (NOVOLOG) 100 UNIT/ML injection Inject 0-20 Units into the skin 3 (three) times daily with meals.  . insulin glargine (LANTUS) 100 UNIT/ML injection Inject 12 Units into the skin at bedtime.   . lidocaine (LIDODERM) 5 % Place 1 patch onto the skin daily. Remove & Discard patch within 12 hours or as directed by MD  . Riesa PopeLOVAZA 1 G capsule Take 1 tablet by mouth daily. *Do Not Crush**  . omeprazole (PRILOSEC) 20 MG capsule Take 20 mg by mouth daily. * Take on an Empty stomach*  . phenazopyridine (PYRIDIUM) 100 MG tablet Take 100 mg by mouth every 6 (six) hours as needed (dysuria).  . polyethylene glycol (MIRALAX / GLYCOLAX) packet Take 17 g by mouth every 3 (three) days.  . rosuvastatin (CRESTOR) 20 MG tablet Take 20 mg by mouth daily.    Marland Kitchen. senna (SENOKOT) 8.6 MG tablet Take 1 tablet by mouth daily.  Marland Kitchen. tiotropium (SPIRIVA) 18 MCG inhalation capsule Place 18 mcg into inhaler and inhale daily.    Marland Kitchen. torsemide (DEMADEX) 20 MG tablet Take 2 tablets (40 mg total) by mouth daily.  . chlorpheniramine-HYDROcodone (TUSSIONEX) 10-8 MG/5ML LQCR Take 5 mLs by mouth every 12 (twelve) hours as needed.  . clarithromycin (BIAXIN) 500 MG tablet Take 1 tablet (500 mg total) by mouth 2 (two) times daily.  Marland Kitchen. dextromethorphan (DELSYM) 30 MG/5ML liquid Take 60 mg by mouth every 12 (twelve) hours as needed.   . predniSONE (DELTASONE) 10 MG tablet Take 4 for three days 3 for three days 2 for three days 1 for three days and stop  . [DISCONTINUED] chlorpheniramine-HYDROcodone (TUSSIONEX) 10-8 MG/5ML LQCR Take 5 mLs by mouth at bedtime as needed.  . [EXPIRED] methylPREDNISolone acetate (DEPO-MEDROL) injection 120 mg

## 2014-09-03 ENCOUNTER — Telehealth: Payer: Self-pay | Admitting: Critical Care Medicine

## 2014-09-03 NOTE — Telephone Encounter (Signed)
Called [facility], spoke with Rene KocherRegina, was [facility].  Line rang Xfew minutes, no answer, no option to leave a voicemail.  [facility], asked [facility] be [facility] straight [facility].  I was told she does not have a direct line and they do not have voicemails on the hall.  I asked for a message to be given to Burna MortimerWanda as she is not available.  I was told that was not an option and the only way to get in touch with Burna MortimerWanda was to keep calling back to the [facility] until someone answers.  I asked for a [facility] direct number and was not given it, but told to call the main line and would be connected.    Will call back tomorrow.

## 2014-09-03 NOTE — Telephone Encounter (Signed)
Spoke to pt's friend in lobby, Museum/gallery curatorDorcas [name]. [name] PatterDorcas was requesting a letter from Dr. Delford FieldWright for pt to have blood sugars checked QID d/t pt being on prednisone. Informed [name] I will call the pt's facility and find out what's needed. Called and spoke to GilbertAlma at [facility] [phone]), questioned [name] if there is any information regarding the pt's accu-check frequency. [name] denied any new information and questioned pt if she is wanting her CBG's check 4 times a day d/t her bring on pred, pt confirmed. Informed [name] to ask the in-house physician if he is ok ordering the new accu-check frequency because her sliding scale may need to be adjusted and the in-house physician orders the insulin. [name] verbalized understanding and denied any further questions or concerns at this time. [name] aware to call back if the in house physicians is unable to write the order for QID accu-check. Nothing further needed at this time.

## 2014-09-04 NOTE — Telephone Encounter (Signed)
Spoke with Burna MortimerWanda, states that nothing needed, matter has already been handled.

## 2014-09-04 NOTE — Telephone Encounter (Signed)
I called and was on hold for a while. The receptionist advised she could give them a message to call back. LMTCB for [name] MortimerWanda to call triage. [name] CurieJennifer Ronald [name], CMA

## 2014-10-01 ENCOUNTER — Encounter: Payer: Self-pay | Admitting: Cardiology

## 2014-10-16 ENCOUNTER — Telehealth: Payer: Self-pay | Admitting: Internal Medicine

## 2014-10-16 NOTE — Telephone Encounter (Signed)
LMOVM for pt to return call 

## 2014-10-16 NOTE — Telephone Encounter (Signed)
New Message        Pt calling stating that she got a letter about a remote pacer check and wants to speak with someone about it. Please call back and advise.

## 2014-10-17 NOTE — Telephone Encounter (Signed)
Pt had to cancel appt for 12-17-14 b/c she had transportation issues and can only do appt's on Monday, Wednesday, or Friday.

## 2014-11-04 ENCOUNTER — Ambulatory Visit (INDEPENDENT_AMBULATORY_CARE_PROVIDER_SITE_OTHER): Payer: Medicare Other | Admitting: Cardiology

## 2014-11-04 ENCOUNTER — Encounter: Payer: Self-pay | Admitting: Cardiology

## 2014-11-04 VITALS — BP 170/64 | HR 75 | Ht 59.0 in | Wt 136.0 lb

## 2014-11-04 DIAGNOSIS — I10 Essential (primary) hypertension: Secondary | ICD-10-CM | POA: Diagnosis not present

## 2014-11-04 DIAGNOSIS — I2583 Coronary atherosclerosis due to lipid rich plaque: Principal | ICD-10-CM

## 2014-11-04 DIAGNOSIS — I2510 Atherosclerotic heart disease of native coronary artery without angina pectoris: Secondary | ICD-10-CM

## 2014-11-04 NOTE — Progress Notes (Signed)
HPI The patient presents for followup of diastolic dysfunction and edema and CAD.  Since I last saw her she says that she has been battling flu and pneumonia that was difficult to treat. However, she did not need to be hospitalized. I do see that she's lost about 20 pounds that she's not eating as much particularly while she was ill. She says now that her breathing is relatively good. She is not describing any new PND or orthopnea. She's not having any fevers and chills. She has a chronic cough that is productive right now of a white thick sputum. Her blood pressure is elevated today but her caregiver reports that it is not elevated particularly at her nursing home although the patient says sometimes it is. She denies any chest pressure, neck or arm discomfort. She's not having any palpitations, presyncope or syncope. She gets around for the most part in her wheelchair.  Allergies  Allergen Reactions  . Codeine     REACTION: unspecified  . Doxycycline   . Guaifenesin & Derivatives   . Penicillins     REACTION: unspecified    Current Outpatient Prescriptions  Medication Sig Dispense Refill  . albuterol (PROVENTIL) (2.5 MG/3ML) 0.083% nebulizer solution Take 3 mLs (2.5 mg total) by nebulization every 4 (four) hours while awake. And every 4 hours as needed for wheezing 75 mL   . allopurinol (ZYLOPRIM) 100 MG tablet Take 100 mg by mouth 2 (two) times daily.      Marland Kitchen. aspirin 81 MG tablet Take 81 mg by mouth daily.      Marland Kitchen. atenolol (TENORMIN) 50 MG tablet Take 50 mg by mouth daily. Hold for SBP <110, HR < 60    . chlorpheniramine-HYDROcodone (TUSSIONEX) 10-8 MG/5ML LQCR Take 5 mLs by mouth every 12 (twelve) hours as needed. 240 mL 0  . Cholecalciferol (VITAMIN D3) 50000 UNITS CAPS Take 1 tablet by mouth once a week.     . clarithromycin (BIAXIN) 500 MG tablet Take 1 tablet (500 mg total) by mouth 2 (two) times daily. 20 tablet 0  . dextromethorphan (DELSYM) 30 MG/5ML liquid Take 60 mg by mouth every  12 (twelve) hours as needed.     Marland Kitchen. epoetin alfa (EPOGEN,PROCRIT) [id number] UNIT/ML injection Inject 10,000 Units into the skin once a week.      . ferrous sulfate 325 (65 FE) MG tablet Take 325 mg by mouth daily.     . Fluticasone Furoate-Vilanterol (BREO ELLIPTA) 100-25 MCG/INH AEPB Inhale 1 puff into the lungs daily. 60 each 11  . insulin aspart (NOVOLOG) 100 UNIT/ML injection Inject 0-20 Units into the skin 3 (three) times daily with meals. 10 mL 11  . insulin glargine (LANTUS) 100 UNIT/ML injection Inject 12 Units into the skin at bedtime.     . lidocaine (LIDODERM) 5 % Place 1 patch onto the skin daily. Remove & Discard patch within 12 hours or as directed by MD    . Riesa PopeLOVAZA 1 G capsule Take 1 tablet by mouth daily. *Do Not Crush**    . omeprazole (PRILOSEC) 20 MG capsule Take 20 mg by mouth daily. * Take on an Empty stomach*    . phenazopyridine (PYRIDIUM) 100 MG tablet Take 100 mg by mouth every 6 (six) hours as needed (dysuria).    . polyethylene glycol (MIRALAX / GLYCOLAX) packet Take 17 g by mouth every 3 (three) days.    . predniSONE (DELTASONE) 10 MG tablet Take 4 for three days 3 for three days 2 for  three days 1 for three days and stop 30 tablet 0  . rosuvastatin (CRESTOR) 20 MG tablet Take 20 mg by mouth daily.      Marland Kitchen senna (SENOKOT) 8.6 MG tablet Take 1 tablet by mouth daily.    Marland Kitchen tiotropium (SPIRIVA) 18 MCG inhalation capsule Place 18 mcg into inhaler and inhale daily.      Marland Kitchen torsemide (DEMADEX) 20 MG tablet Take 2 tablets (40 mg total) by mouth daily. 60 tablet 6   No current facility-administered medications for this visit.    Past Medical History  Diagnosis Date  . COPD (chronic obstructive pulmonary disease)   . Diastolic congestive heart failure   . C. difficile colitis   . Hypertension   . Hyperlipidemia   . CAD (coronary artery disease)   . Anemia   . Type 2 diabetes mellitus   . Pacemaker 2002  . Cardiac arrest - asystole 2002  . Gallstones   . Pulmonary  hypertension   . Renal insufficiency   . Osteoarthritis   . Allergic rhinitis   . Gout   . Stasis dermatitis   . Sinoatrial node dysfunction   . Syncope   . Postsurgical percutaneous transluminal coronary angioplasty status   . Dyslipidemia   . Acute bronchitis   . History of cholelithiasis   . Obesity     Past Surgical History  Procedure Laterality Date  . Ptca  2002  . Appendectomy    . Vesicovaginal fistula closure w/ tah    . Pacemaker placement  2002    ROS: As stated in the HPI and negative for all other systems.  PHYSICAL EXAM BP 170/64 mmHg  Pulse 75  Ht  (1.499 m)  Wt 136 lb (61.689 kg)  BMI 27.45 kg/m2 PHYSICAL EXAM GEN:  No distress NECK:  No jugular venous distention at 45 degrees, waveform within normal limits, carotid upstroke brisk and symmetric, no bruits, no thyromegaly LYMPHATICS:  No cervical adenopathy LUNGS:  Decreased breath sounds with coarse rhonchi diffusely BACK:  No CVA tenderness CHEST:  Well healed pacer pocket. HEART:  S1 and S2 within normal limits, no S3, no S4, no clicks, no rubs, no murmurs ABD:  Positive bowel sounds normal in frequency in pitch, no bruits, no rebound, no guarding. EXT:  2 plus pulses throughout, trace bilateral lower extremity edema less than previous, no cyanosis no clubbing, with chronic venous stasis.  .    EKG:  Sinus rhythm, rate 75, left bundle branch block,  no acute ST-T wave changes.  LBBBis new compared with previous EKGs.  I don't see pacing spikes.  However, I suspect that this is a paced rhythm.   11/04/2014  ASSESSMENT AND PLAN  DYSPNEA - She seems to be at baseline though her lungs don't sound good. However, I think she is euvolemic. No change in therapy is indicated.  HYPERTENSION - I have given written instructions to the nursing home to send me one week's worth of daily blood pressure readings as I might need to adjust her medicines.   PACEMAKER - She is now up to date with pacemaker  follow up.   CORONARY ARTERY DISEASE - No further work up is planned at this point.

## 2014-11-04 NOTE — Patient Instructions (Signed)
Your physician recommends that you schedule a follow-up appointment in: 6 months with Dr. Antoine PocheHochrein  We have written instructions for your facility to take your blood pressure once a day for one month and to report to Dr. Antoine PocheHochrein

## 2014-11-22 ENCOUNTER — Encounter: Payer: Self-pay | Admitting: Internal Medicine

## 2014-11-22 ENCOUNTER — Ambulatory Visit (INDEPENDENT_AMBULATORY_CARE_PROVIDER_SITE_OTHER): Payer: Medicare Other | Admitting: Internal Medicine

## 2014-11-22 VITALS — BP 116/50 | HR 82 | Ht 59.0 in

## 2014-11-22 DIAGNOSIS — I495 Sick sinus syndrome: Secondary | ICD-10-CM | POA: Diagnosis not present

## 2014-11-22 DIAGNOSIS — Z79899 Other long term (current) drug therapy: Secondary | ICD-10-CM | POA: Diagnosis not present

## 2014-11-22 DIAGNOSIS — Z45018 Encounter for adjustment and management of other part of cardiac pacemaker: Secondary | ICD-10-CM

## 2014-11-22 DIAGNOSIS — R55 Syncope and collapse: Secondary | ICD-10-CM | POA: Diagnosis not present

## 2014-11-22 LAB — MDC_IDC_ENUM_SESS_TYPE_INCLINIC
Battery Impedance: 181 Ohm
Brady Statistic AP VP Percent: 1 %
Brady Statistic AP VS Percent: 7 %
Brady Statistic AS VS Percent: 76 %
Date Time Interrogation Session: 20160311160655
Lead Channel Impedance Value: 494 Ohm
Lead Channel Impedance Value: 610 Ohm
Lead Channel Pacing Threshold Amplitude: 0.75 V
Lead Channel Pacing Threshold Pulse Width: 0.4 ms
Lead Channel Sensing Intrinsic Amplitude: 2 mV
Lead Channel Setting Sensing Sensitivity: 5.6 mV
MDC IDC MSMT BATTERY REMAINING LONGEVITY: 135 mo
MDC IDC MSMT BATTERY VOLTAGE: 2.79 V
MDC IDC MSMT LEADCHNL RA PACING THRESHOLD AMPLITUDE: 0.75 V
MDC IDC MSMT LEADCHNL RV PACING THRESHOLD PULSEWIDTH: 0.4 ms
MDC IDC MSMT LEADCHNL RV SENSING INTR AMPL: 11.2 mV
MDC IDC SET LEADCHNL RA PACING AMPLITUDE: 2 V
MDC IDC SET LEADCHNL RV PACING AMPLITUDE: 2.5 V
MDC IDC SET LEADCHNL RV PACING PULSEWIDTH: 0.4 ms
MDC IDC STAT BRADY AS VP PERCENT: 16 %

## 2014-11-22 LAB — BASIC METABOLIC PANEL
BUN: 65 mg/dL — ABNORMAL HIGH (ref 6–23)
CALCIUM: 9.5 mg/dL (ref 8.4–10.5)
CO2: 33 mEq/L — ABNORMAL HIGH (ref 19–32)
Chloride: 92 mEq/L — ABNORMAL LOW (ref 96–112)
Creatinine, Ser: 3.5 mg/dL — ABNORMAL HIGH (ref 0.40–1.20)
GFR: 13.2 mL/min — CL (ref >60.00)
Glucose, Bld: 106 mg/dL — ABNORMAL HIGH (ref 70–99)
Potassium: 3.4 mEq/L — ABNORMAL LOW (ref 3.5–5.1)
SODIUM: 133 meq/L — AB (ref 135–145)

## 2014-11-22 NOTE — Progress Notes (Signed)
skf      Patient Care Team: Renford Dillsonald Polite, MD as PCP - General (Internal Medicine) Storm FriskPatrick E Wright, MD (Pulmonary Disease)   HPI  [name] is a 81 y.o. female seen in followup for syncope for which she underwent pacemaker implantation some time ago. She underwent pacemaker generator replacement December 2012 She continues to struggle with pneumonia her dyspnea is stable her cough is improved. She denies chest pain. She has not had significant edema.   She saw Dr. Davonna BellingJH about 3 weeks ago; that time her blood pressure was elevated; no adjustments were made at that time.     Echo May 2009 Normal LV function  Catheterization April 2006 demonstrated LAD 40% stenosis. This proximal stent which was patent with 40-50% stenosis. First diagonal had ostial 60% stenosis, second diagonal was normal. The circumflex had luminal irregularities. There was a ramus intermediate with ostial 60% stenosis, mid obtuse marginalhad luminal irregularities, the right coronary artery is dominant with luminal irregularities. The patient had PTCA of the LAD in 2002)   Past Medical History  Diagnosis Date  . COPD (chronic obstructive pulmonary disease)   . Diastolic congestive heart failure   . C. difficile colitis   . Hypertension   . Hyperlipidemia   . CAD (coronary artery disease)   . Anemia   . Type 2 diabetes mellitus   . Pacemaker 2002  . Cardiac arrest - asystole 2002  . Gallstones   . Pulmonary hypertension   . Renal insufficiency   . Osteoarthritis   . Allergic rhinitis   . Gout   . Stasis dermatitis   . Sinoatrial node dysfunction   . Syncope   . Postsurgical percutaneous transluminal coronary angioplasty status   . Dyslipidemia   . Acute bronchitis   . History of cholelithiasis   . Obesity     Past Surgical History  Procedure Laterality Date  . Ptca  2002  . Appendectomy    . Vesicovaginal fistula closure w/ tah    . Pacemaker placement  2002    Current Outpatient  Prescriptions  Medication Sig Dispense Refill  . acetaminophen (TYLENOL) 325 MG tablet Take 650 mg by mouth every 6 (six) hours as needed for mild pain, moderate pain, fever or headache.    . albuterol (PROVENTIL) (2.5 MG/3ML) 0.083% nebulizer solution Take 3 mLs (2.5 mg total) by nebulization every 4 (four) hours while awake. And every 4 hours as needed for wheezing 75 mL   . allopurinol (ZYLOPRIM) 100 MG tablet Take 100 mg by mouth 2 (two) times daily.      Marland Kitchen. aspirin 81 MG tablet Take 81 mg by mouth daily.      Marland Kitchen. atenolol (TENORMIN) 50 MG tablet Take 50 mg by mouth daily. Hold for SBP <110, HR < 60    . chlorpheniramine-HYDROcodone (TUSSIONEX) 10-8 MG/5ML LQCR Take 5 mLs by mouth every 12 (twelve) hours as needed. 240 mL 0  . Cholecalciferol (VITAMIN D3) 50000 UNITS CAPS Take 1 tablet by mouth once a week.     Marland Kitchen. dextromethorphan (DELSYM) 30 MG/5ML liquid Take 60 mg by mouth every 12 (twelve) hours as needed.     . ferrous sulfate 325 (65 FE) MG tablet Take 325 mg by mouth daily.     . Fluticasone Furoate-Vilanterol (BREO ELLIPTA) 100-25 MCG/INH AEPB Inhale 1 puff into the lungs daily. 60 each 11  . insulin aspart (NOVOLOG) 100 UNIT/ML injection Inject 0-20 Units into the skin 3 (three) times daily with meals.  10 mL 11  . insulin glargine (LANTUS) 100 UNIT/ML injection Inject 12 Units into the skin at bedtime.     . lidocaine (LIDODERM) 5 % Place 1 patch onto the skin daily. Remove & Discard patch within 12 hours or as directed by MD    . [name] 1 G capsule Take 1 tablet by mouth daily. *Do Not Crush**    . omeprazole (PRILOSEC) 20 MG capsule Take 20 mg by mouth daily. * Take on an Empty stomach*    . polyethylene glycol (MIRALAX / GLYCOLAX) packet Take 17 g by mouth every 3 (three) days.    . Polyvinyl Alcohol (LIQUID TEARS OP) Apply 1 drop to eye 4 (four) times daily.    . rosuvastatin (CRESTOR) 20 MG tablet Take 20 mg by mouth daily.      Marland Kitchen senna (SENOKOT) 8.6 MG tablet Take 1 tablet by mouth  daily.    Marland Kitchen tiotropium (SPIRIVA) 18 MCG inhalation capsule Place 18 mcg into inhaler and inhale daily.      Marland Kitchen torsemide (DEMADEX) 20 MG tablet Take 2 tablets (40 mg total) by mouth daily. 60 tablet 6   No current facility-administered medications for this visit.    Allergies  Allergen Reactions  . Codeine     REACTION: unspecified  . Doxycycline   . Guaifenesin & Derivatives   . Penicillins     REACTION: unspecified    Review of Systems negative except from HPI and PMH  Physical Exam BP 116/50 mmHg  Pulse 82  Ht  (1.499 m)  Wt  Well developed and well nourished sitting in a wheelchair using oxygen in no distress HENT normal E scleral and icterus clear Neck Supple Clear to ausculation  Regular rate and rhythm 2/6 murmur Soft with active bowel sounds No clubbing cyanosis  Edema Alert and oriented, grossly normal motor and sensory function sitting in a wheelchair Skin Warm and Dry  ECG 2/16 demonstrated sinus rhythm with left bundle branch block  Assessment and  Plan  HFpEF she is euvolemic  Pacemaker-[employer] The patient's device was interrogated.  The information was reviewed. No changes were made in the programming.    Syncope-likely orthostatic  Renal insufficiency most recently checked/15 creatinine 1.8   Diabetes  Atrial fibrillation-paroxysmal  Coronary artery disease with proximal LAD stenting Without symptoms of ischemia   She is now wheelchair-bound. Syncope is no longer an issue.  Device interrogation has demonstrated an intercurrent atrial fibrillation. Her CHADS-VASc score is greater than or equal to 5 and as such she should be on anticoagulation. We will check her renal function today and anticipate the use of apixaban. Once we have the renal function back I will forward this information to her primary care physician as well as Dr. Antoine Poche.

## 2014-11-22 NOTE — Patient Instructions (Addendum)
Your physician recommends that you continue on your current medications as directed. Please refer to the Current Medication list given to you today.  Lab today: BMET   Your physician recommends that you schedule a follow-up appointment in: 6 months with the [facility] and 12 months with Dr.Klein

## 2014-12-17 ENCOUNTER — Encounter: Payer: Medicare Other | Admitting: Internal Medicine

## 2014-12-23 ENCOUNTER — Encounter: Payer: Self-pay | Admitting: Critical Care Medicine

## 2014-12-23 ENCOUNTER — Ambulatory Visit (INDEPENDENT_AMBULATORY_CARE_PROVIDER_SITE_OTHER): Payer: Medicare Other | Admitting: Critical Care Medicine

## 2014-12-23 VITALS — BP 130/72 | HR 71 | Temp 98.2°F | Ht 59.0 in | Wt 126.0 lb

## 2014-12-23 DIAGNOSIS — J449 Chronic obstructive pulmonary disease, unspecified: Secondary | ICD-10-CM | POA: Diagnosis not present

## 2014-12-23 NOTE — Patient Instructions (Signed)
No change in medications. Return in         4 months 

## 2014-12-23 NOTE — Progress Notes (Signed)
Subjective:    Patient ID: [name], female    DOB: [birth year], 81 y.o.   MRN: [id number]  HPI 81 y.o.F quit smoking in 1987 and chronic renal insufficiency with coronary artery disease. The patient had history of congestive heart failure and diabetes. The patient has been hospitalized on numerous occasions for respiratory failure and pneumonia. Patient currently is residing in a nursing care facility.   12/23/2014 Chief Complaint  Patient presents with  . Follow-up    Pt c/o of being nauseated. Pt denies any SOB, wheezing, chest tightness/congestion.  Dyspnea is better .  No recent hosp/ed stays  No wheezing now.  breo / spiriva help. Pt denies any significant sore throat, nasal congestion or excess secretions, fever, chills, sweats, unintended weight loss, pleurtic or exertional chest pain, orthopnea PND, or leg swelling Pt denies any increase in rescue therapy over baseline, denies waking up needing it or having any early am or nocturnal exacerbations of coughing/wheezing/or dyspnea. Pt also denies any obvious fluctuation in symptoms with  weather or environmental change or other alleviating or aggravating factors   Objective:    Review of Systems Constitutional:   No  weight loss, night sweats,  Fevers, chills, fatigue, lassitude. HEENT:   No headaches,  Difficulty swallowing,  Tooth/dental problems,  Sore throat,                No sneezing, itching, ear ache, nasal congestion, post nasal drip,   CV:  No chest pain,  Orthopnea, PND, swelling in lower extremities, anasarca, dizziness, palpitations  GI  No heartburn, indigestion, abdominal pain, nausea, vomiting, diarrhea, change in bowel habits, loss of appetite  Resp: Notes  shortness of breath with exertion not  at rest.  No excess mucus, no productive cough,  No non-productive cough,  No coughing up of blood.  No change in color of mucus.  No wheezing.  No chest wall deformity  Skin: no rash or lesions.  GU: no dysuria,  change in color of urine, no urgency or frequency.  No flank pain.  MS:  No joint pain or swelling.  No decreased range of motion.  No back pain.  Psych:  No change in mood or affect. No depression or anxiety.  No memory loss.     Objective:   Physical Exam Filed Vitals:   12/23/14 1005  BP: 130/72  Pulse: 71  Temp: 98.2 F (36.8 C)  TempSrc: Oral  Height: 4\' 11"  (1.499 m)  Weight: 126 lb (57.153 kg)  SpO2: 99%    Gen: Pleasant, well-nourished, in no distress,  normal affect  ENT: No lesions,  mouth clear,  oropharynx clear, no postnasal drip  Neck: No JVD, no TMG, no carotid bruits  Lungs: No use of accessory muscles, no dullness to percussion, distant breath sounds  Cardiovascular: RRR, heart sounds normal, no murmur or gallops, 3+ peripheral edema  Abdomen: soft and NT, no HSM,  BS normal  Musculoskeletal: No deformities, no cyanosis or clubbing  Neuro: alert, non focal  Skin: Warm, no lesions or rashes        Assessment & Plan:   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 Return in  4 months     Updated Medication List Outpatient Encounter Prescriptions as of 12/23/2014  Medication Sig  . acetaminophen (TYLENOL) 325 MG tablet Take 650 mg by mouth every 6 (six) hours as needed for mild pain,  moderate pain, fever or headache.  . albuterol (PROVENTIL) (2.5 MG/3ML) 0.083% nebulizer solution Take 3 mLs (2.5 mg total) by nebulization every 4 (four) hours while awake. And every 4 hours as needed for wheezing  . allopurinol (ZYLOPRIM) 100 MG tablet Take 100 mg by mouth 2 (two) times daily.    Marland Kitchen aspirin 81 MG tablet Take 81 mg by mouth daily.    Marland Kitchen atenolol (TENORMIN) 50 MG tablet Take 50 mg by mouth daily. Hold for SBP <110, HR < 60  . chlorpheniramine-HYDROcodone (TUSSIONEX) 10-8 MG/5ML LQCR Take 5 mLs by mouth every 12 (twelve) hours as needed.  . Cholecalciferol  (VITAMIN D3) 50000 UNITS CAPS Take 1 tablet by mouth once a week.   Marland Kitchen dextromethorphan (DELSYM) 30 MG/5ML liquid Take 60 mg by mouth every 12 (twelve) hours as needed.   . ferrous sulfate 325 (65 FE) MG tablet Take 325 mg by mouth daily.   . Fluticasone Furoate-Vilanterol (BREO ELLIPTA) 100-25 MCG/INH AEPB Inhale 1 puff into the lungs daily.  . insulin aspart (NOVOLOG) 100 UNIT/ML injection Inject 0-20 Units into the skin 3 (three) times daily with meals.  . insulin glargine (LANTUS) 100 UNIT/ML injection Inject 12 Units into the skin at bedtime.   . lidocaine (LIDODERM) 5 % Place 1 patch onto the skin daily. Remove & Discard patch within 12 hours or as directed by MD  . [name] 1 G capsule Take 1 tablet by mouth daily. *Do Not Crush**  . omeprazole (PRILOSEC) 20 MG capsule Take 20 mg by mouth daily. * Take on an Empty stomach*  . polyethylene glycol (MIRALAX / GLYCOLAX) packet Take 17 g by mouth every 3 (three) days.  . Polyvinyl Alcohol (LIQUID TEARS OP) Apply 1 drop to eye 4 (four) times daily.  . rosuvastatin (CRESTOR) 20 MG tablet Take 20 mg by mouth daily.    Marland Kitchen senna (SENOKOT) 8.6 MG tablet Take 1 tablet by mouth daily.  Marland Kitchen sulfamethoxazole-trimethoprim (BACTRIM,SEPTRA) 400-80 MG per tablet 1 tablet 2 (two) times daily.  Marland Kitchen tiotropium (SPIRIVA) 18 MCG inhalation capsule Place 18 mcg into inhaler and inhale daily.    Marland Kitchen torsemide (DEMADEX) 20 MG tablet Take 2 tablets (40 mg total) by mouth daily.

## 2014-12-24 NOTE — Assessment & Plan Note (Signed)
Copd with Gold D emphysema and chronic bronchitis Plan No change in inhaled or maintenance medications. Return in  4 months

## 2015-02-24 ENCOUNTER — Encounter: Payer: Medicare Other | Admitting: *Deleted

## 2015-02-24 ENCOUNTER — Encounter: Payer: Self-pay | Admitting: Cardiology

## 2015-02-24 ENCOUNTER — Telehealth: Payer: Self-pay | Admitting: Cardiology

## 2015-02-24 NOTE — Telephone Encounter (Signed)
Spoke with pt and reminded pt of remote transmission that is due today. Pt verbalized understanding.   

## 2015-02-25 ENCOUNTER — Encounter: Payer: Self-pay | Admitting: Cardiology

## 2015-04-18 ENCOUNTER — Telehealth: Payer: Self-pay | Admitting: Cardiology

## 2015-04-18 NOTE — Telephone Encounter (Signed)
Patient is not feeling well---please call

## 2015-04-18 NOTE — Telephone Encounter (Signed)
Patient had appointment with Dr. Antoine Poche scheduled for 8/8 and cancelled this appointment because she needed to see her kidney doctor. Patient wanted to know if she can be seen sooner than October - after this was scheduled with Joyce Gross. Informed her I have no further control over schedule than Joyce Gross does. She voiced understanding. Asked if she had any issues that I needed to pass along to MD and she said no. No further action needed.

## 2015-04-21 ENCOUNTER — Ambulatory Visit: Payer: Medicare Other | Admitting: Cardiology

## 2015-04-30 ENCOUNTER — Ambulatory Visit (INDEPENDENT_AMBULATORY_CARE_PROVIDER_SITE_OTHER): Payer: Medicare Other | Admitting: Critical Care Medicine

## 2015-04-30 ENCOUNTER — Encounter: Payer: Self-pay | Admitting: Critical Care Medicine

## 2015-04-30 VITALS — BP 152/80 | HR 83 | Temp 98.7°F | Ht 59.0 in | Wt 127.0 lb

## 2015-04-30 DIAGNOSIS — J9611 Chronic respiratory failure with hypoxia: Secondary | ICD-10-CM

## 2015-04-30 DIAGNOSIS — J449 Chronic obstructive pulmonary disease, unspecified: Secondary | ICD-10-CM | POA: Diagnosis not present

## 2015-04-30 DIAGNOSIS — N189 Chronic kidney disease, unspecified: Secondary | ICD-10-CM | POA: Diagnosis not present

## 2015-04-30 DIAGNOSIS — D631 Anemia in chronic kidney disease: Secondary | ICD-10-CM | POA: Diagnosis not present

## 2015-04-30 DIAGNOSIS — J4489 Other specified chronic obstructive pulmonary disease: Secondary | ICD-10-CM

## 2015-04-30 NOTE — Patient Instructions (Signed)
No change in medications. Return in        6 months        

## 2015-04-30 NOTE — Progress Notes (Signed)
   Subjective:    Patient ID: [name], female    DOB: May 02, 1928, 81 y.o.   MRN: [id number]  HPI 04/30/2015 Chief Complaint  Patient presents with  . 4 month follow up    Good days and bad days.  Bad days mostly on hot, humid days.  Cough improved with thick, white mucus.  No chest tightness or CP.   Pt on ABX recently for uti.  No new issues.  Pt denies any significant sore throat, nasal congestion or excess secretions, fever, chills, sweats, unintended weight loss, pleurtic or exertional chest pain, orthopnea PND, or leg swelling Pt denies any increase in rescue therapy over baseline, denies waking up needing it or having any early am or nocturnal exacerbations of coughing/wheezing/or dyspnea. Pt also denies any obvious fluctuation in symptoms with  weather or environmental change or other alleviating or aggravating factors   Current Medications, Allergies, Complete Past Medical History, Past Surgical History, Family History, and Social History were reviewed in [facility] electronic medical record per todays encounter:  04/30/2015  Review of Systems  Constitutional: Negative.   HENT: Negative.  Negative for ear pain, postnasal drip, rhinorrhea, sinus pressure, sore throat, trouble swallowing and voice change.   Eyes: Negative.   Respiratory: Positive for cough, shortness of breath and wheezing. Negative for apnea, choking, chest tightness and stridor.   Cardiovascular: Negative.  Negative for chest pain, palpitations and leg swelling.  Gastrointestinal: Negative.  Negative for nausea, vomiting, abdominal pain and abdominal distention.  Genitourinary: Negative.   Musculoskeletal: Negative.  Negative for myalgias and arthralgias.  Skin: Negative.  Negative for rash.  Allergic/Immunologic: Negative.  Negative for environmental allergies and food allergies.  Neurological: Negative.  Negative for dizziness, syncope, weakness and headaches.  Hematological: Negative.  Negative for  adenopathy. Does not bruise/bleed easily.  Psychiatric/Behavioral: Negative.  Negative for sleep disturbance and agitation. The patient is not nervous/anxious.        Objective:   Physical Exam  Filed Vitals:   04/30/15 1114  BP: 152/80  Pulse: 83  Temp: 98.7 F (37.1 C)  TempSrc: Oral  Height:  (1.499 m)  Weight: 127 lb (57.607 kg)  SpO2: 98%    Gen: Pleasant, well-nourished, in no distress,  normal affect  ENT: No lesions,  mouth clear,  oropharynx clear, no postnasal drip  Neck: No JVD, no TMG, no carotid bruits  Lungs: No use of accessory muscles, no dullness to percussion, distant bs  Cardiovascular: RRR, heart sounds normal, no murmur or gallops, no peripheral edema  Abdomen: soft and NT, no HSM,  BS normal  Musculoskeletal: No deformities, no cyanosis or clubbing  Neuro: alert, non focal  Skin: Warm, no lesions or rashes  No results found.       Assessment & Plan:  I personally reviewed all images and lab data in the [facility] system as well as any outside material available during this office visit and agree with the  radiology impressions.   COPD with chronic bronchitis Gold D Gold D copd Stable Cont inhaled meds

## 2015-05-02 NOTE — Assessment & Plan Note (Signed)
Gold D copd Stable Cont inhaled meds

## 2015-05-12 ENCOUNTER — Encounter (HOSPITAL_COMMUNITY): Payer: Self-pay | Admitting: *Deleted

## 2015-05-12 ENCOUNTER — Emergency Department (HOSPITAL_COMMUNITY): Payer: Medicare Other

## 2015-05-12 ENCOUNTER — Inpatient Hospital Stay (HOSPITAL_COMMUNITY)
Admission: EM | Admit: 2015-05-12 | Discharge: 2015-06-14 | DRG: 870 | Disposition: E | Payer: Medicare Other | Attending: Internal Medicine | Admitting: Internal Medicine

## 2015-05-12 DIAGNOSIS — J9622 Acute and chronic respiratory failure with hypercapnia: Secondary | ICD-10-CM | POA: Diagnosis present

## 2015-05-12 DIAGNOSIS — Y95 Nosocomial condition: Secondary | ICD-10-CM | POA: Diagnosis present

## 2015-05-12 DIAGNOSIS — Z515 Encounter for palliative care: Secondary | ICD-10-CM | POA: Diagnosis not present

## 2015-05-12 DIAGNOSIS — Z880 Allergy status to penicillin: Secondary | ICD-10-CM | POA: Diagnosis not present

## 2015-05-12 DIAGNOSIS — J189 Pneumonia, unspecified organism: Secondary | ICD-10-CM | POA: Diagnosis present

## 2015-05-12 DIAGNOSIS — J449 Chronic obstructive pulmonary disease, unspecified: Secondary | ICD-10-CM | POA: Diagnosis present

## 2015-05-12 DIAGNOSIS — Z888 Allergy status to other drugs, medicaments and biological substances status: Secondary | ICD-10-CM | POA: Diagnosis not present

## 2015-05-12 DIAGNOSIS — J9621 Acute and chronic respiratory failure with hypoxia: Secondary | ICD-10-CM | POA: Diagnosis present

## 2015-05-12 DIAGNOSIS — N186 End stage renal disease: Secondary | ICD-10-CM | POA: Diagnosis present

## 2015-05-12 DIAGNOSIS — I272 Other secondary pulmonary hypertension: Secondary | ICD-10-CM | POA: Diagnosis present

## 2015-05-12 DIAGNOSIS — J309 Allergic rhinitis, unspecified: Secondary | ICD-10-CM | POA: Diagnosis present

## 2015-05-12 DIAGNOSIS — Z6828 Body mass index (BMI) 28.0-28.9, adult: Secondary | ICD-10-CM

## 2015-05-12 DIAGNOSIS — I2510 Atherosclerotic heart disease of native coronary artery without angina pectoris: Secondary | ICD-10-CM | POA: Diagnosis present

## 2015-05-12 DIAGNOSIS — Z8249 Family history of ischemic heart disease and other diseases of the circulatory system: Secondary | ICD-10-CM

## 2015-05-12 DIAGNOSIS — R34 Anuria and oliguria: Secondary | ICD-10-CM | POA: Diagnosis present

## 2015-05-12 DIAGNOSIS — E872 Acidosis: Secondary | ICD-10-CM | POA: Diagnosis present

## 2015-05-12 DIAGNOSIS — G9341 Metabolic encephalopathy: Secondary | ICD-10-CM | POA: Diagnosis present

## 2015-05-12 DIAGNOSIS — R6521 Severe sepsis with septic shock: Secondary | ICD-10-CM | POA: Diagnosis present

## 2015-05-12 DIAGNOSIS — E871 Hypo-osmolality and hyponatremia: Secondary | ICD-10-CM | POA: Diagnosis present

## 2015-05-12 DIAGNOSIS — Z87891 Personal history of nicotine dependence: Secondary | ICD-10-CM

## 2015-05-12 DIAGNOSIS — N179 Acute kidney failure, unspecified: Secondary | ICD-10-CM | POA: Diagnosis present

## 2015-05-12 DIAGNOSIS — D649 Anemia, unspecified: Secondary | ICD-10-CM | POA: Diagnosis present

## 2015-05-12 DIAGNOSIS — E669 Obesity, unspecified: Secondary | ICD-10-CM | POA: Diagnosis present

## 2015-05-12 DIAGNOSIS — Z993 Dependence on wheelchair: Secondary | ICD-10-CM | POA: Diagnosis not present

## 2015-05-12 DIAGNOSIS — I480 Paroxysmal atrial fibrillation: Secondary | ICD-10-CM | POA: Diagnosis present

## 2015-05-12 DIAGNOSIS — Z794 Long term (current) use of insulin: Secondary | ICD-10-CM

## 2015-05-12 DIAGNOSIS — Z9981 Dependence on supplemental oxygen: Secondary | ICD-10-CM | POA: Diagnosis not present

## 2015-05-12 DIAGNOSIS — Z881 Allergy status to other antibiotic agents status: Secondary | ICD-10-CM

## 2015-05-12 DIAGNOSIS — M109 Gout, unspecified: Secondary | ICD-10-CM | POA: Diagnosis present

## 2015-05-12 DIAGNOSIS — D638 Anemia in other chronic diseases classified elsewhere: Secondary | ICD-10-CM | POA: Diagnosis present

## 2015-05-12 DIAGNOSIS — Z9289 Personal history of other medical treatment: Secondary | ICD-10-CM

## 2015-05-12 DIAGNOSIS — I5033 Acute on chronic diastolic (congestive) heart failure: Secondary | ICD-10-CM | POA: Diagnosis present

## 2015-05-12 DIAGNOSIS — J810 Acute pulmonary edema: Secondary | ICD-10-CM | POA: Diagnosis not present

## 2015-05-12 DIAGNOSIS — J9601 Acute respiratory failure with hypoxia: Secondary | ICD-10-CM

## 2015-05-12 DIAGNOSIS — E1122 Type 2 diabetes mellitus with diabetic chronic kidney disease: Secondary | ICD-10-CM | POA: Diagnosis present

## 2015-05-12 DIAGNOSIS — R739 Hyperglycemia, unspecified: Secondary | ICD-10-CM | POA: Diagnosis present

## 2015-05-12 DIAGNOSIS — Z952 Presence of prosthetic heart valve: Secondary | ICD-10-CM

## 2015-05-12 DIAGNOSIS — R54 Age-related physical debility: Secondary | ICD-10-CM | POA: Diagnosis present

## 2015-05-12 DIAGNOSIS — Z7982 Long term (current) use of aspirin: Secondary | ICD-10-CM

## 2015-05-12 DIAGNOSIS — Z950 Presence of cardiac pacemaker: Secondary | ICD-10-CM

## 2015-05-12 DIAGNOSIS — I120 Hypertensive chronic kidney disease with stage 5 chronic kidney disease or end stage renal disease: Secondary | ICD-10-CM | POA: Diagnosis present

## 2015-05-12 DIAGNOSIS — T85598A Other mechanical complication of other gastrointestinal prosthetic devices, implants and grafts, initial encounter: Secondary | ICD-10-CM | POA: Insufficient documentation

## 2015-05-12 DIAGNOSIS — R570 Cardiogenic shock: Secondary | ICD-10-CM

## 2015-05-12 DIAGNOSIS — I509 Heart failure, unspecified: Secondary | ICD-10-CM | POA: Diagnosis not present

## 2015-05-12 DIAGNOSIS — Z79899 Other long term (current) drug therapy: Secondary | ICD-10-CM

## 2015-05-12 DIAGNOSIS — E785 Hyperlipidemia, unspecified: Secondary | ICD-10-CM | POA: Diagnosis present

## 2015-05-12 DIAGNOSIS — A419 Sepsis, unspecified organism: Principal | ICD-10-CM | POA: Diagnosis present

## 2015-05-12 DIAGNOSIS — M1990 Unspecified osteoarthritis, unspecified site: Secondary | ICD-10-CM | POA: Diagnosis present

## 2015-05-12 DIAGNOSIS — Z7189 Other specified counseling: Secondary | ICD-10-CM | POA: Insufficient documentation

## 2015-05-12 DIAGNOSIS — Z885 Allergy status to narcotic agent status: Secondary | ICD-10-CM

## 2015-05-12 DIAGNOSIS — Z8674 Personal history of sudden cardiac arrest: Secondary | ICD-10-CM

## 2015-05-12 DIAGNOSIS — J9690 Respiratory failure, unspecified, unspecified whether with hypoxia or hypercapnia: Secondary | ICD-10-CM | POA: Insufficient documentation

## 2015-05-12 DIAGNOSIS — Z992 Dependence on renal dialysis: Secondary | ICD-10-CM | POA: Diagnosis not present

## 2015-05-12 LAB — CBC WITH DIFFERENTIAL/PLATELET
Basophils Absolute: 0 10*3/uL (ref 0.0–0.1)
Basophils Relative: 0 % (ref 0–1)
EOS ABS: 0 10*3/uL (ref 0.0–0.7)
EOS PCT: 0 % (ref 0–5)
HCT: 32 % — ABNORMAL LOW (ref 36.0–46.0)
HEMOGLOBIN: 10.2 g/dL — AB (ref 12.0–15.0)
LYMPHS ABS: 0.4 10*3/uL — AB (ref 0.7–4.0)
LYMPHS PCT: 4 % — AB (ref 12–46)
MCH: 26.2 pg (ref 26.0–34.0)
MCHC: 31.9 g/dL (ref 30.0–36.0)
MCV: 82.3 fL (ref 78.0–100.0)
Monocytes Absolute: 0.4 10*3/uL (ref 0.1–1.0)
Monocytes Relative: 4 % (ref 3–12)
NEUTROS PCT: 92 % — AB (ref 43–77)
Neutro Abs: 11.8 10*3/uL — ABNORMAL HIGH (ref 1.7–7.7)
PLATELETS: 176 10*3/uL (ref 150–400)
RBC: 3.89 MIL/uL (ref 3.87–5.11)
RDW: 16.4 % — ABNORMAL HIGH (ref 11.5–15.5)
WBC: 12.7 10*3/uL — AB (ref 4.0–10.5)

## 2015-05-12 LAB — BLOOD GAS, ARTERIAL
ACID-BASE DEFICIT: 12 mmol/L — AB (ref 0.0–2.0)
Bicarbonate: 17.1 mEq/L — ABNORMAL LOW (ref 20.0–24.0)
Drawn by: 422461
FIO2: 1
LHR: 18 {breaths}/min
MECHVT: 350 mL
O2 SAT: 97.6 %
PEEP/CPAP: 5 cmH2O
PH ART: 7.102 — AB (ref 7.350–7.450)
Patient temperature: 99.4
TCO2: 17.1 mmol/L (ref 0–100)
pCO2 arterial: 57.8 mmHg (ref 35.0–45.0)
pO2, Arterial: 157 mmHg — ABNORMAL HIGH (ref 80.0–100.0)

## 2015-05-12 LAB — I-STAT TROPONIN, ED: TROPONIN I, POC: 0.08 ng/mL (ref 0.00–0.08)

## 2015-05-12 LAB — COMPREHENSIVE METABOLIC PANEL
ALT: 18 U/L (ref 14–54)
AST: 58 U/L — ABNORMAL HIGH (ref 15–41)
Albumin: 2.9 g/dL — ABNORMAL LOW (ref 3.5–5.0)
Alkaline Phosphatase: 142 U/L — ABNORMAL HIGH (ref 38–126)
Anion gap: 19 — ABNORMAL HIGH (ref 5–15)
BILIRUBIN TOTAL: 1 mg/dL (ref 0.3–1.2)
BUN: 124 mg/dL — AB (ref 6–20)
CHLORIDE: 93 mmol/L — AB (ref 101–111)
CO2: 21 mmol/L — ABNORMAL LOW (ref 22–32)
Calcium: 8.5 mg/dL — ABNORMAL LOW (ref 8.9–10.3)
Creatinine, Ser: 5.67 mg/dL — ABNORMAL HIGH (ref 0.44–1.00)
GFR calc non Af Amer: 6 mL/min — ABNORMAL LOW (ref >60)
GFR, EST AFRICAN AMERICAN: 7 mL/min — AB (ref >60)
Glucose, Bld: 69 mg/dL (ref 65–99)
POTASSIUM: 4 mmol/L (ref 3.5–5.1)
Sodium: 133 mmol/L — ABNORMAL LOW (ref 135–145)
TOTAL PROTEIN: 6.7 g/dL (ref 6.5–8.1)

## 2015-05-12 LAB — BLOOD GAS, VENOUS
Acid-base deficit: 5 mmol/L — ABNORMAL HIGH (ref 0.0–2.0)
BICARBONATE: 21.3 meq/L (ref 20.0–24.0)
Drawn by: 280161
O2 Content: 2 L/min
O2 Saturation: 90.2 %
PCO2 VEN: 48.2 mmHg (ref 45.0–50.0)
PH VEN: 7.27 (ref 7.250–7.300)
PO2 VEN: 67.4 mmHg — AB (ref 30.0–45.0)
Patient temperature: 99.4
TCO2: 20 mmol/L (ref 0–100)

## 2015-05-12 LAB — I-STAT CHEM 8, ED
BUN: 117 mg/dL — AB (ref 6–20)
CALCIUM ION: 0.97 mmol/L — AB (ref 1.13–1.30)
CREATININE: 5.4 mg/dL — AB (ref 0.44–1.00)
Chloride: 96 mmol/L — ABNORMAL LOW (ref 101–111)
GLUCOSE: 65 mg/dL (ref 65–99)
HCT: 35 % — ABNORMAL LOW (ref 36.0–46.0)
HEMOGLOBIN: 11.9 g/dL — AB (ref 12.0–15.0)
Potassium: 4 mmol/L (ref 3.5–5.1)
Sodium: 130 mmol/L — ABNORMAL LOW (ref 135–145)
TCO2: 22 mmol/L (ref 0–100)

## 2015-05-12 LAB — I-STAT CG4 LACTIC ACID, ED: LACTIC ACID, VENOUS: 1.99 mmol/L (ref 0.5–2.0)

## 2015-05-12 LAB — BRAIN NATRIURETIC PEPTIDE: B NATRIURETIC PEPTIDE 5: 639.1 pg/mL — AB (ref 0.0–100.0)

## 2015-05-12 LAB — CBG MONITORING, ED: GLUCOSE-CAPILLARY: 67 mg/dL (ref 65–99)

## 2015-05-12 MED ORDER — SODIUM CHLORIDE 0.9 % IV BOLUS (SEPSIS)
1000.0000 mL | Freq: Once | INTRAVENOUS | Status: AC
  Administered 2015-05-12: 1000 mL via INTRAVENOUS

## 2015-05-12 MED ORDER — SODIUM CHLORIDE 0.9 % IJ SOLN
3.0000 mL | Freq: Two times a day (BID) | INTRAMUSCULAR | Status: DC
  Administered 2015-05-13: 10 mL via INTRAVENOUS
  Administered 2015-05-13 – 2015-05-18 (×10): 3 mL via INTRAVENOUS

## 2015-05-12 MED ORDER — PANTOPRAZOLE SODIUM 40 MG IV SOLR
40.0000 mg | Freq: Every day | INTRAVENOUS | Status: DC
  Administered 2015-05-13 – 2015-05-17 (×6): 40 mg via INTRAVENOUS
  Filled 2015-05-12 (×7): qty 40

## 2015-05-12 MED ORDER — VANCOMYCIN HCL 500 MG IV SOLR: 500.0000 mg | INTRAVENOUS | Status: DC

## 2015-05-12 MED ORDER — FENTANYL CITRATE (PF) 100 MCG/2ML IJ SOLN
INTRAMUSCULAR | Status: AC
  Administered 2015-05-12: 75 ug
  Filled 2015-05-12: qty 2

## 2015-05-12 MED ORDER — SODIUM CHLORIDE 0.9 % IV SOLN: 250.0000 mL | INTRAVENOUS | Status: DC | PRN

## 2015-05-12 MED ORDER — ALBUTEROL SULFATE (2.5 MG/3ML) 0.083% IN NEBU: 2.5000 mg | INHALATION_SOLUTION | RESPIRATORY_TRACT | Status: DC | PRN

## 2015-05-12 MED ORDER — DEXTROSE 5 % IV SOLN
0.0000 ug/min | Freq: Once | INTRAVENOUS | Status: AC
  Administered 2015-05-12: 2 ug/min via INTRAVENOUS
  Filled 2015-05-12: qty 4

## 2015-05-12 MED ORDER — LIDOCAINE HCL (CARDIAC) 20 MG/ML IV SOLN
INTRAVENOUS | Status: AC
  Filled 2015-05-12: qty 5

## 2015-05-12 MED ORDER — ROCURONIUM BROMIDE 50 MG/5ML IV SOLN
INTRAVENOUS | Status: AC
  Administered 2015-05-12: 70 mg
  Filled 2015-05-12: qty 2

## 2015-05-12 MED ORDER — ETOMIDATE 2 MG/ML IV SOLN
INTRAVENOUS | Status: AC
  Administered 2015-05-12: 15 mg
  Filled 2015-05-12: qty 20

## 2015-05-12 MED ORDER — SUCCINYLCHOLINE CHLORIDE 20 MG/ML IJ SOLN
INTRAMUSCULAR | Status: AC
  Filled 2015-05-12: qty 1

## 2015-05-12 MED ORDER — VANCOMYCIN HCL IN DEXTROSE 750-5 MG/150ML-% IV SOLN
750.0000 mg | Freq: Once | INTRAVENOUS | Status: AC
  Administered 2015-05-12: 750 mg via INTRAVENOUS
  Filled 2015-05-12: qty 150

## 2015-05-12 MED ORDER — HEPARIN SODIUM (PORCINE) 5000 UNIT/ML IJ SOLN
5000.0000 [IU] | Freq: Three times a day (TID) | INTRAMUSCULAR | Status: DC
  Administered 2015-05-13 – 2015-05-18 (×17): 5000 [IU] via SUBCUTANEOUS
  Filled 2015-05-12 (×23): qty 1

## 2015-05-12 MED ORDER — SODIUM CHLORIDE 0.9 % IJ SOLN
3.0000 mL | INTRAMUSCULAR | Status: DC | PRN
  Administered 2015-05-13: 30 mL via INTRAVENOUS
  Administered 2015-05-15: 10 mL via INTRAVENOUS
  Filled 2015-05-12 (×2): qty 3

## 2015-05-12 MED ORDER — IPRATROPIUM-ALBUTEROL 0.5-2.5 (3) MG/3ML IN SOLN
3.0000 mL | Freq: Four times a day (QID) | RESPIRATORY_TRACT | Status: DC
  Administered 2015-05-13 – 2015-05-18 (×24): 3 mL via RESPIRATORY_TRACT
  Filled 2015-05-12 (×22): qty 3

## 2015-05-12 MED ORDER — PROPOFOL 1000 MG/100ML IV EMUL
INTRAVENOUS | Status: AC
  Administered 2015-05-12: 1000 mg
  Filled 2015-05-12: qty 100

## 2015-05-12 NOTE — Progress Notes (Signed)
CSW attempted to meet with patient at bedside. However, patient has in intubation tube at this time and was not able to communicate. There was no family present.   CSW made nurse aware.  Trish Mage [phone] ED CSW 22-May-2015 11:00 PM

## 2015-05-12 NOTE — ED Notes (Signed)
Critical Care at bedside.  

## 2015-05-12 NOTE — H&P (Signed)
PULMONARY / CRITICAL CARE MEDICINE   Name: [name] MRN: [id number] DOB: 02/10/1928    ADMISSION DATE:  04/17/2015 CONSULTATION DATE:  04/28/2015  REFERRING MD :  EDP  CHIEF COMPLAINT:  AMS  INITIAL PRESENTATION: 81 year old female from [place] presented to [facility] ED 8/29 with AMS. Recent complaints of respiratory distress and confusion. In ED was intubated for airway protection. Found to be in renal failure with pulmonary edema on CXR. PCCM to admit.   STUDIES:  8/29 CT head > no acute findings  SIGNIFICANT EVENTS: 8/26 > some worsening respiratory distress 8/29 > admitted to ICU, intubated  HISTORY OF PRESENT ILLNESS:  81 year old female with multiple medical problems for [place]. Her PMH is outlined below, and is significant for COPD (GOLD D, on home O2 3L, followed by PW), CAD with h/o arrest, Diastolic CHF (followed by Dr. Percival Spanish), she has pacemaker and recent interrogations show PAF (not currently on anticoagulation), DM 2, and PAH. She also has stage 4 CKD, and has reportedly not wanted dialysis in the past. At baseline she is somewhat independent. Recently she has been confined to wheelchair, but brother reports that she is able to dress herself and walk short distances. She considers this favorable according to brother. Her son is the Media planner, but is out of town on vacation this week. He and the brother have been communicating and making decisions together.   Her brother visited her at [facility] 8/25 or 8/26 (he can't remember) and noted her to be in some acute respiratory distress. He described an audible wheeze. She had cough, but it was non-productive and no worse than her chronic baseline cough. He is not sure if anything was done to address this. It appears as though she was recently started on cipro, although the reasoning for that is unclear. Since that time she has had a steady decline in mental status, which reportedly accelerated today 8/29 at about 1pm. She  was evaluated by the facility's physician who decided to transfer her to ED. In ED she was profoundly altered and somewhat combative. She moved all 4 extremities equally, but was non-purposeful.  and intubated for airway protection. CXR was ordered, and was consistent with pulmonary edema. Lab work was significant for kidney injury (SCr 5.4 up from 3.5 3/16) and mild leukocytosis (12). No significant electrolyte abnormalities. She became hypotensive and was started on levophed. PCCM contacted for admission.   PAST MEDICAL HISTORY :   has a past medical history of COPD (chronic obstructive pulmonary disease); Diastolic congestive heart failure; C. difficile colitis; Hypertension; Hyperlipidemia; CAD (coronary artery disease); Anemia; Type 2 diabetes mellitus; Pacemaker (2002); Cardiac arrest - asystole (2002); Gallstones; Pulmonary hypertension; Renal insufficiency; Osteoarthritis; Allergic rhinitis; Gout; Stasis dermatitis; Sinoatrial node dysfunction; Syncope; Postsurgical percutaneous transluminal coronary angioplasty status; Dyslipidemia; Acute bronchitis; History of cholelithiasis; and Obesity.  has past surgical history that includes Mitral valve replacement (2002); Appendectomy; Vesicovaginal fistula closure w/ TAH; and pacemaker placement (2002). Prior to Admission medications   Medication Sig Start Date End Date Taking? Authorizing Provider  acetaminophen (TYLENOL) 325 MG tablet Take 650 mg by mouth every 6 (six) hours as needed for mild pain, moderate pain, fever or headache.   Yes Historical Provider, MD  albuterol (PROVENTIL) (2.5 MG/3ML) 0.083% nebulizer solution Take 3 mLs (2.5 mg total) by nebulization every 4 (four) hours while awake. And every 4 hours as needed for wheezing 10/05/13  Yes Elsie Stain, MD  albuterol (PROVENTIL) (2.5  MG/3ML) 0.083% nebulizer solution Take 2.5 mg by nebulization every 4 (four) hours as needed for wheezing or shortness of breath.   Yes Historical Provider, MD   allopurinol (ZYLOPRIM) 100 MG tablet Take 100 mg by mouth 2 (two) times daily.     Yes Historical Provider, MD  aspirin 81 MG tablet Take 81 mg by mouth daily.     Yes Historical Provider, MD  atenolol (TENORMIN) 50 MG tablet Take 50 mg by mouth daily. Hold for SBP <110, HR < 60   Yes Historical Provider, MD  Cholecalciferol (VITAMIN D3) 2000 UNITS TABS Take 1 tablet by mouth daily.   Yes Historical Provider, MD  ferrous sulfate 325 (65 FE) MG tablet Take 325 mg by mouth daily.    Yes Historical Provider, MD  Fluticasone Furoate-Vilanterol (BREO ELLIPTA) 100-25 MCG/INH AEPB Inhale 1 puff into the lungs daily. 03/27/14  Yes Elsie Stain, MD  insulin aspart (NOVOLOG) 100 UNIT/ML injection Inject 0-20 Units into the skin 3 (three) times daily with meals. Patient taking differently: Inject 0-8 Units into the skin 2 (two) times daily. Sliding scale: 200-250=2 units 251-300=4 301-350=6 351-400=8 09/19/13  Yes William S Minor, NP  lidocaine (LIDODERM) 5 % Place 1 patch onto the skin daily. Remove & Discard patch within 12 hours or as directed by MD   Yes Historical Provider, MD  LOVAZA 1 G capsule Take 1 tablet by mouth daily. *Do Not Crush**   Yes Historical Provider, MD  omeprazole (PRILOSEC) 20 MG capsule Take 20 mg by mouth daily. * Take on an Empty stomach*   Yes Historical Provider, MD  OXYGEN Inhale into the lungs. 3 lpm   Yes Historical Provider, MD  polyethylene glycol (MIRALAX / GLYCOLAX) packet Take 17 g by mouth every 3 (three) days.   Yes Historical Provider, MD  Polyvinyl Alcohol (LIQUID TEARS OP) Place 1 drop into both eyes 4 (four) times daily.    Yes Historical Provider, MD  PROCRIT 29937 UNIT/ML injection Inject 10,000 Units into the skin once a week. Every Friday.   Yes Historical Provider, MD  rosuvastatin (CRESTOR) 20 MG tablet Take 20 mg by mouth daily.     Yes Historical Provider, MD  senna (SENOKOT) 8.6 MG tablet Take 1 tablet by mouth daily.   Yes Historical Provider, MD   tiotropium (SPIRIVA) 18 MCG inhalation capsule Place 18 mcg into inhaler and inhale daily.     Yes Historical Provider, MD  torsemide (DEMADEX) 20 MG tablet Take 2 tablets (40 mg total) by mouth daily. 10/05/13  Yes Elsie Stain, MD   Allergies  Allergen Reactions  . Codeine     REACTION: unspecified  . Doxycycline   . Guaifenesin & Derivatives   . Penicillins     REACTION: unspecified    FAMILY HISTORY:  indicated that her mother is deceased. She indicated that her father is deceased.  SOCIAL HISTORY:  reports that she quit smoking about 29 years ago. Her smoking use included Cigarettes. She has a 50 pack-year smoking history. She has never used smokeless tobacco. She reports that she does not drink alcohol or use illicit drugs.  REVIEW OF SYSTEMS:  Unable due to intubation/encephalopathy  SUBJECTIVE: vented, pressors required, low output  VITAL SIGNS: Temp:  [99.4 F (37.4 C)] 99.4 F (37.4 C) (08/29 1913) Pulse Rate:  [59-73] 63 (08/29 2310) Resp:  [17-31] 26 (08/29 2310) BP: (67-132)/(33-81) 93/46 mmHg (08/29 2310) SpO2:  [90 %-100 %] 100 % (08/29 2310) FiO2 (%):  [  80 %-100 %] 80 % (08/29 2310) HEMODYNAMICS:   VENTILATOR SETTINGS: Vent Mode:  [-] PRVC FiO2 (%):  [80 %-100 %] 80 % Set Rate:  [18 bmp-26 bmp] 26 bmp Vt Set:  [350 mL] 350 mL PEEP:  [5 cmH20] 5 cmH20 Plateau Pressure:  [21 cmH20] 21 cmH20 INTAKE / OUTPUT:  Intake/Output Summary (Last 24 hours) at 04/29/2015 2325 Last data filed at 04/22/2015 2150  Gross per 24 hour  Intake   1010 ml  Output      0 ml  Net   1010 ml    PHYSICAL EXAMINATION: General:  Elderly, chronically ill appearing female in NAD Neuro:  GCS 3T on no sedation. PERRL. HEENT:  [place]/AT, no JVD noted. Cardiovascular:  Paced on tele. RRR, no MRG Lungs:  Coarse bilateral breath sounds Abdomen:  Soft, non-distended Musculoskeletal:  No acute deformity, +2 pitting edema to BLE Skin:  Grossly intact  LABS:  CBC  Recent Labs Lab  05/10/2015 1933 04/23/2015 1945  WBC 12.7*  --   HGB 10.2* 11.9*  HCT 32.0* 35.0*  PLT 176  --    Coag's No results for input(s): APTT, INR in the last 168 hours. BMET  Recent Labs Lab 05/09/2015 1933 04/22/2015 1945  NA 133* 130*  K 4.0 4.0  CL 93* 96*  CO2 21*  --   BUN 124* 117*  CREATININE 5.67* 5.40*  GLUCOSE 69 65   Electrolytes  Recent Labs Lab 05/14/2015 1933  CALCIUM 8.5*   Sepsis Markers  Recent Labs Lab 05/11/2015 1944  LATICACIDVEN 1.99   ABG  Recent Labs Lab 04/15/2015 2249  PHART 7.102*  PCO2ART 57.8*  PO2ART 157*   Liver Enzymes  Recent Labs Lab 04/18/2015 1933  AST 58*  ALT 18  ALKPHOS 142*  BILITOT 1.0  ALBUMIN 2.9*   Cardiac Enzymes No results for input(s): TROPONINI, PROBNP in the last 168 hours. Glucose  Recent Labs Lab 04/22/2015 1917  GLUCAP 67    Imaging Dg Chest 1 View  05/11/2015   CLINICAL DATA:  Nursing home patient with acute onset of mental status changes and nonproductive cough earlier today around 1300 hr. Former smoker. Current history of COPD, diastolic CHF, hypertension, diabetes and coronary artery disease.  EXAM: CHEST  1 VIEW  COMPARISON:  09/02/2014 and earlier.  FINDINGS: Cardiac silhouette markedly enlarged. Mild-to-moderate diffuse interstitial and airspace pulmonary edema, asymmetric and increased in the right lung. Associated small bilateral pleural effusions. Stable linear scarring at the left lung base. Left subclavian dual lead transvenous pacemaker unchanged and appears intact.  IMPRESSION: Acute CHF, with stable marked cardiomegaly and mild to moderate diffuse interstitial and airspace pulmonary edema, asymmetric and increased on the right. Associated small bilateral pleural effusions.   Electronically Signed   By: Evangeline Dakin M.D.   On: 04/16/2015 19:57   Ct Head Wo Contrast  05/08/2015   CLINICAL DATA:  Decreased level of consciousness today.  EXAM: CT HEAD WITHOUT CONTRAST  TECHNIQUE: Contiguous axial  images were obtained from the base of the skull through the vertex without intravenous contrast.  COMPARISON:  09/14/2013  FINDINGS: Stable age related cerebral atrophy, ventriculomegaly and periventricular white matter disease. No extra-axial fluid collections are identified. No CT findings for acute hemispheric infarction or intracranial hemorrhage. No mass lesions. Probable remote white matter infarct in the right frontal area. The brainstem and cerebellum are normal.  No acute bony findings. The paranasal sinuses and mastoid air cells are clear.  IMPRESSION: Stable age related cerebral atrophy,  ventriculomegaly and periventricular white matter disease. No acute intracranial findings or mass lesion.   Electronically Signed   By: Marijo Sanes M.D.   On: 04/18/2015 20:18   Dg Chest Port 1 View  05/10/2015   CLINICAL DATA:  Endotracheal tube placement.  Initial encounter.  EXAM: PORTABLE CHEST - 1 VIEW  COMPARISON:  Chest radiograph performed earlier today at 7:47 p.m.  FINDINGS: The patient's endotracheal tube is seen ending 2-3 cm above the carina.  A small left pleural effusion is noted. Diffuse bilateral airspace opacification is mildly worsened from the prior study and may reflect multifocal pneumonia or possibly pulmonary edema. No pneumothorax is seen.  The cardiomediastinal silhouette is mildly enlarged. No acute osseous abnormalities are identified. A pacemaker is noted overlying the left chest wall, with leads ending overlying the right atrium and right ventricle.  IMPRESSION: 1. Endotracheal tube seen ending 2-3 cm above the carina. 2. Small left pleural effusion noted. Diffuse bilateral airspace opacification is mildly worsened from the prior study and may reflect multifocal pneumonia or possibly pulmonary edema. Mild cardiomegaly noted.   Electronically Signed   By: Garald Balding M.D.   On: 05/13/2015 21:42     ASSESSMENT / PLAN:  PULMONARY OETT  8/29 >>> A: Acute on chronic hypercarbic  respiratory failure COPD without acute exacerbation R/o PNA Pulmonary edema concerns  P:   Full vent support Follow CXR, ABG Nebulized bronchodilators VAP bundle  CARDIOVASCULAR CVL 8/29 >>> A:  Shock, suspect cardiogenic. Less likely septic Acute on chronic diastolic CHF PAF (not on anticoagulation), currently paced H/o CAD, cardiac arrest Pacemaker status  P:  Telemetry monitoring MAP goal > 70m/hg 30cc/kg [facility] 17 Levophed titrate for MAP goal, may need dopamine if stays low HR 2D Echo ASAP in AM Trend troponin, lactic acid Insert CVL CVP monitoring Co-Ox  RENAL A:   Acute on CKD Hyponatremia, mild Volume overload  P:   Follow Bmet with hydration and BP support Suspect will need CRRT Consult nephrology in AM Renal [place]oreaInsert foley Check FeNa, however is anuric at this time.  GASTROINTESTINAL A:   No acute issues  P:   NPO PPI  HEMATOLOGIC A:   Anemia of chronic illness  P:  Follow CBC Heparin sq for VTE ppx  INFECTIOUS A:   ? Septic shock, marginally meets SIRS. Source unclear. Favor HCAP ? UTI/hyrdonephrosis, is anuric presently P:   BCx2 8/29 >>> UC 8/29 >>> Sputum 8/29 >>> Abx: Aztreonam, start date 8/29 >>> Abx: Vancomycin, start date 8/29 >>> PCT Follow WBC and fever curve  ENDOCRINE A:   DM2   P:   CBG monitoring and SSI Cortisol pending  NEUROLOGIC A:   Acute metabolic encephalopathy  P:   RASS goal: -1 PRN fentanyl  Avoid benzo  FAMILY  - Updates:   - Inter-disciplinary family meet or Palliative Care meeting due by:  9/5   PGeorgann Housekeeper AGACNP-BC LDurantPulmonology/Critical Care Pager [phone] ([phone] 05/13/2015 12:08 AM   STAFF NOTE: ILinwood Dibbles MD FACP have personally reviewed patient's available data, including medical history, events of note, physical examination and test results as part of my evaluation. I have discussed with resident/NP and other care  providers such as pharmacist, RN and RRT. In addition, I personally evaluated patient and elicited key findings of: presents witrh profound acidosis in setting likely PNA / copd advanced, secretions starting to increase , although no fevers and marginal 2/4 sirs but  favor sepsis and intravascular hypovolemia as cause shock, cvp now at 17, she received fluids 30 cc/kg, abx, BC sent, repeat LActic , see below for re assessment examination, add levofloxacin to aztreonam, vanc, pcxr looks patchy, repeat abg soon to avoid alkalosis on rate 26 as will develop resp alk and waste copd bicarb, add stress roids empiric as we wait for cortisol level, may need addition dopamine at bata affect if remains low HR, some crt drop with fluids given, assess q8h chem The patient is critically ill with multiple organ systems failure and requires high complexity decision making for assessment and support, frequent evaluation and titration of therapies, application of advanced monitoring technologies and extensive interpretation of multiple databases.   Critical Care Time devoted to patient care services described in this note is 40 Minutes. This time reflects time of care of this signee: Merrie Roof, MD FACP. This critical care time does not reflect procedure time, or teaching time or supervisory time of PA/NP/Med student/Med Resident etc but could involve care discussion time. Rest per NP/medical resident whose note is outlined above and that I agree with   Lavon Paganini. Titus Mould, MD, [place] Pgr: [facility] 05/13/2015 5:47 AM   Sepsis - Repeat Assessment  Performed at:    557 am 8/30  Vitals     Blood pressure 80/60, pulse 63, temperature 99.4 F (37.4 C), temperature source Rectal, resp. rate 25, height _0  (1.499 m), weight 64.1 kg (141 lb 5 oz), SpO2 98 %.  Heart:     Regular rate and rhythm  Lungs:    [place]  Capillary Refill:   <2 sec  Peripheral Pulse:   Radial pulse  palpable  Skin:     Pale

## 2015-05-12 NOTE — ED Provider Notes (Signed)
CSN: [id number]     Arrival date & time 05/23/2015  1855 History   First MD Initiated Contact with Patient 05/23/15 1908     Chief Complaint  Patient presents with  . Altered Mental Status     Patient is a 81 y.o. female presenting with altered mental status. The history is provided by the EMS personnel and a relative. No language interpreter was used.  Altered Mental Status  [name] presents for evaluation of SOB.  Level V caveat due to unresponsiveness. History is obtained by EMS. Per report she has had one week of increased cough and chest congestion, several days of increased drowsiness. She's had significant decrease in her mental status around 1 PM today. She received 1 g of Rocephin at 5 PM today due to concern of pneumonia. No reports of fevers, vomiting, diarrhea. She has a history of renal insufficiency, CHF, COPD. Additional history is obtained by her brother. He states that she has seen a kidney doctor and has mentioned he does not think she would want dialysis, but she would want resuscitation and treatment for her illnesses including CPR and intubation if these were needed.  Past Medical History  Diagnosis Date  . COPD (chronic obstructive pulmonary disease)   . Diastolic congestive heart failure   . C. difficile colitis   . Hypertension   . Hyperlipidemia   . CAD (coronary artery disease)   . Anemia   . Type 2 diabetes mellitus   . Pacemaker 2002  . Cardiac arrest - asystole 2002  . Gallstones   . Pulmonary hypertension   . Renal insufficiency   . Osteoarthritis   . Allergic rhinitis   . Gout   . Stasis dermatitis   . Sinoatrial node dysfunction   . Syncope   . Postsurgical percutaneous transluminal coronary angioplasty status   . Dyslipidemia   . Acute bronchitis   . History of cholelithiasis   . Obesity    Past Surgical History  Procedure Laterality Date  . Ptca  2002  . Appendectomy    . Vesicovaginal fistula closure w/ tah    . Pacemaker placement   2002   Family History  Problem Relation Age of Onset  . Diabetes Mother   . Hypertension Mother   . Heart disease Father    Social History  Substance Use Topics  . Smoking status: Former Smoker -- 2.00 packs/day for 25 years    Types: Cigarettes    Quit date: 09/13/1985  . Smokeless tobacco: Never Used  . Alcohol Use: No   OB History    No data available     Review of Systems  Unable to perform ROS     Allergies  Codeine; Doxycycline; Guaifenesin & derivatives; and Penicillins  Home Medications   Prior to Admission medications   Medication Sig Start Date End Date Taking? Authorizing Provider  acetaminophen (TYLENOL) 325 MG tablet Take 650 mg by mouth every 6 (six) hours as needed for mild pain, moderate pain, fever or headache.   Yes Historical Provider, MD  albuterol (PROVENTIL) (2.5 MG/3ML) 0.083% nebulizer solution Take 3 mLs (2.5 mg total) by nebulization every 4 (four) hours while awake. And every 4 hours as needed for wheezing 10/05/13  Yes Storm Frisk, MD  albuterol (PROVENTIL) (2.5 MG/3ML) 0.083% nebulizer solution Take 2.5 mg by nebulization every 4 (four) hours as needed for wheezing or shortness of breath.   Yes Historical Provider, MD  allopurinol (ZYLOPRIM) 100 MG tablet Take 100 mg  by mouth 2 (two) times daily.     Yes Historical Provider, MD  aspirin 81 MG tablet Take 81 mg by mouth daily.     Yes Historical Provider, MD  atenolol (TENORMIN) 50 MG tablet Take 50 mg by mouth daily. Hold for SBP <110, HR < 60   Yes Historical Provider, MD  Cholecalciferol (VITAMIN D3) 2000 UNITS TABS Take 1 tablet by mouth daily.   Yes Historical Provider, MD  ferrous sulfate 325 (65 FE) MG tablet Take 325 mg by mouth daily.    Yes Historical Provider, MD  Fluticasone Furoate-Vilanterol (BREO ELLIPTA) 100-25 MCG/INH AEPB Inhale 1 puff into the lungs daily. 03/27/14  Yes Storm Frisk, MD  insulin aspart (NOVOLOG) 100 UNIT/ML injection Inject 0-20 Units into the skin 3  (three) times daily with meals. Patient taking differently: Inject 0-8 Units into the skin 2 (two) times daily. Sliding scale: 200-250=2 units 251-300=4 301-350=6 351-400=8 09/19/13  Yes William S Minor, NP  lidocaine (LIDODERM) 5 % Place 1 patch onto the skin daily. Remove & Discard patch within 12 hours or as directed by MD   Yes Historical Provider, MD  LOVAZA 1 G capsule Take 1 tablet by mouth daily. *Do Not Crush**   Yes Historical Provider, MD  omeprazole (PRILOSEC) 20 MG capsule Take 20 mg by mouth daily. * Take on an Empty stomach*   Yes Historical Provider, MD  OXYGEN Inhale into the lungs. 3 lpm   Yes Historical Provider, MD  polyethylene glycol (MIRALAX / GLYCOLAX) packet Take 17 g by mouth every 3 (three) days.   Yes Historical Provider, MD  Polyvinyl Alcohol (LIQUID TEARS OP) Place 1 drop into both eyes 4 (four) times daily.    Yes Historical Provider, MD  PROCRIT 04540 UNIT/ML injection Inject 10,000 Units into the skin once a week. Every Friday.   Yes Historical Provider, MD  rosuvastatin (CRESTOR) 20 MG tablet Take 20 mg by mouth daily.     Yes Historical Provider, MD  senna (SENOKOT) 8.6 MG tablet Take 1 tablet by mouth daily.   Yes Historical Provider, MD  tiotropium (SPIRIVA) 18 MCG inhalation capsule Place 18 mcg into inhaler and inhale daily.     Yes Historical Provider, MD  torsemide (DEMADEX) 20 MG tablet Take 2 tablets (40 mg total) by mouth daily. 10/05/13  Yes Storm Frisk, MD   BP 93/46 mmHg  Pulse 64  Temp(Src) 99.4 F (37.4 C) (Rectal)  Resp 18  SpO2 100% Physical Exam  Constitutional: She appears well-developed and well-nourished. She appears distressed.  HENT:  Head: Normocephalic and atraumatic.  Eyes: Pupils are equal, round, and reactive to light.  Cardiovascular: Normal rate and regular rhythm.   No murmur heard. Pulmonary/Chest: She is in respiratory distress.  Diffuse rales with increased work of breathing  Abdominal: Soft. There is no  tenderness. There is no rebound and no guarding.  Musculoskeletal: She exhibits no tenderness.  Nonpitting lower extremity edema bilaterally  Neurological:  Lethargic, arouses to verbal stimuli. Often moans yes or no but does not answer questions appropriately. Moves all extremities symmetrically but weakly  Skin: Skin is warm and dry.  Psychiatric: She has a normal mood and affect. Her behavior is normal.  Nursing note and vitals reviewed.   ED Course  Procedures (including critical care time)  CRITICAL CARE Performed by: Tilden Fossa   Total critical care time: 45 minutes  Critical care time was exclusive of separately billable procedures and treating other patients.  Critical  care was necessary to treat or prevent imminent or life-threatening deterioration.  Critical care was time spent personally by me on the following activities: development of treatment plan with patient and/or surrogate as well as nursing, discussions with consultants, evaluation of patient's response to treatment, examination of patient, obtaining history from patient or surrogate, ordering and performing treatments and interventions, ordering and review of laboratory studies, ordering and review of radiographic studies, pulse oximetry and re-evaluation of patient's condition.  INTUBATION Performed by: Tilden Fossa  Required items: required blood products, implants, devices, and special equipment available Patient identity confirmed: provided demographic data and hospital-assigned identification number Time out: Immediately prior to procedure a "time out" was called to verify the correct patient, procedure, equipment, support staff and site/side marked as required.  Indications: respiratory failure  Intubation method: Glidescope Laryngoscopy   Preoxygenation: BVM  Sedatives: Etomidate Paralytic: rocuronium  Tube Size: 7.5 cuffed  Post-procedure assessment: chest rise and ETCO2 monitor Breath  sounds: equal and absent over the epigastrium Tube secured with: ETT holder Chest x-ray interpreted by radiologist and me.  Chest x-ray findings: endotracheal tube in appropriate position  Patient tolerated the procedure well with no immediate complications.     Labs Review Labs Reviewed  COMPREHENSIVE METABOLIC PANEL - Abnormal; Notable for the following:    Sodium 133 (*)    Chloride 93 (*)    CO2 21 (*)    BUN 124 (*)    Creatinine, Ser 5.67 (*)    Calcium 8.5 (*)    Albumin 2.9 (*)    AST 58 (*)    Alkaline Phosphatase 142 (*)    GFR calc non Af Amer 6 (*)    GFR calc Af Amer 7 (*)    Anion gap 19 (*)    All other components within normal limits  CBC WITH DIFFERENTIAL/PLATELET - Abnormal; Notable for the following:    WBC 12.7 (*)    Hemoglobin 10.2 (*)    HCT 32.0 (*)    RDW 16.4 (*)    Neutrophils Relative % 92 (*)    Neutro Abs 11.8 (*)    Lymphocytes Relative 4 (*)    Lymphs Abs 0.4 (*)    All other components within normal limits  BRAIN NATRIURETIC PEPTIDE - Abnormal; Notable for the following:    B Natriuretic Peptide 639.1 (*)    All other components within normal limits  BLOOD GAS, VENOUS - Abnormal; Notable for the following:    pO2, Ven 67.4 (*)    Acid-base deficit 5.0 (*)    All other components within normal limits  BLOOD GAS, ARTERIAL - Abnormal; Notable for the following:    pH, Arterial 7.102 (*)    pCO2 arterial 57.8 (*)    pO2, Arterial 157 (*)    Bicarbonate 17.1 (*)    Acid-base deficit 12.0 (*)    All other components within normal limits  I-STAT CHEM 8, ED - Abnormal; Notable for the following:    Sodium 130 (*)    Chloride 96 (*)    BUN 117 (*)    Creatinine, Ser 5.40 (*)    Calcium, Ion 0.97 (*)    Hemoglobin 11.9 (*)    HCT 35.0 (*)    All other components within normal limits  URINE CULTURE  CULTURE, BLOOD (ROUTINE X 2)  CULTURE, BLOOD (ROUTINE X 2)  CBG MONITORING, ED  I-STAT TROPOININ, ED  I-STAT CG4 LACTIC ACID, ED     Imaging Review Dg Chest 1 View  05/03/2015   CLINICAL DATA:  Nursing home patient with acute onset of mental status changes and nonproductive cough earlier today around 1300 hr. Former smoker. Current history of COPD, diastolic CHF, hypertension, diabetes and coronary artery disease.  EXAM: CHEST  1 VIEW  COMPARISON:  09/02/2014 and earlier.  FINDINGS: Cardiac silhouette markedly enlarged. Mild-to-moderate diffuse interstitial and airspace pulmonary edema, asymmetric and increased in the right lung. Associated small bilateral pleural effusions. Stable linear scarring at the left lung base. Left subclavian dual lead transvenous pacemaker unchanged and appears intact.  IMPRESSION: Acute CHF, with stable marked cardiomegaly and mild to moderate diffuse interstitial and airspace pulmonary edema, asymmetric and increased on the right. Associated small bilateral pleural effusions.   Electronically Signed   By: Hulan Saas M.D.   On: 05/09/2015 19:57   Ct Head Wo Contrast  04/14/2015   CLINICAL DATA:  Decreased level of consciousness today.  EXAM: CT HEAD WITHOUT CONTRAST  TECHNIQUE: Contiguous axial images were obtained from the base of the skull through the vertex without intravenous contrast.  COMPARISON:  09/14/2013  FINDINGS: Stable age related cerebral atrophy, ventriculomegaly and periventricular white matter disease. No extra-axial fluid collections are identified. No CT findings for acute hemispheric infarction or intracranial hemorrhage. No mass lesions. Probable remote white matter infarct in the right frontal area. The brainstem and cerebellum are normal.  No acute bony findings. The paranasal sinuses and mastoid air cells are clear.  IMPRESSION: Stable age related cerebral atrophy, ventriculomegaly and periventricular white matter disease. No acute intracranial findings or mass lesion.   Electronically Signed   By: Rudie Meyer M.D.   On: 05/05/2015 20:18   Dg Chest Port 1 View  05/01/2015    CLINICAL DATA:  Endotracheal tube placement.  Initial encounter.  EXAM: PORTABLE CHEST - 1 VIEW  COMPARISON:  Chest radiograph performed earlier today at 7:47 p.m.  FINDINGS: The patient's endotracheal tube is seen ending 2-3 cm above the carina.  A small left pleural effusion is noted. Diffuse bilateral airspace opacification is mildly worsened from the prior study and may reflect multifocal pneumonia or possibly pulmonary edema. No pneumothorax is seen.  The cardiomediastinal silhouette is mildly enlarged. No acute osseous abnormalities are identified. A pacemaker is noted overlying the left chest wall, with leads ending overlying the right atrium and right ventricle.  IMPRESSION: 1. Endotracheal tube seen ending 2-3 cm above the carina. 2. Small left pleural effusion noted. Diffuse bilateral airspace opacification is mildly worsened from the prior study and may reflect multifocal pneumonia or possibly pulmonary edema. Mild cardiomegaly noted.   Electronically Signed   By: Roanna Raider M.D.   On: 04/19/2015 21:42   I have personally reviewed and evaluated these images and lab results as part of my medical decision-making.   EKG Interpretation   Date/Time:  Monday May 12 2015 19:05:30 EDT Ventricular Rate:  78 PR Interval:    QRS Duration: 138 QT Interval:  459 QTC Calculation: 523 R Axis:   -18 Text Interpretation:  Afib/flut and V-paced complexes No further analysis  attempted due to paced rhythm Confirmed by Lincoln Brigham [phone]) on 04/26/2015  9:16:51 PM      MDM   Final diagnoses:  Acute kidney injury    Patient with multiple medical problems here for progressive shortness of breath and altered mental status. Patient critically ill on examination with respiratory distress, volume overload, depressed mental status. After extensive conversations with family patient was intubated and started on pressors for respiratory support  and blood pressure support. Unclear what the source of her  acute renal failure and pulmonary edema is. Vancomycin was added to her antibiotics following her initial Rocephin that she received prior to hospital arrival. Discussed with the intensivist regarding admission for further management.  Tilden Fossa, MD 05/13/15 [phone]

## 2015-05-12 NOTE — ED Notes (Signed)
Bed: ZO10 Expected date:  Expected time:  Means of arrival:  Comments: Ems- elderly lethargic

## 2015-05-12 NOTE — ED Notes (Signed)
Per EMS- from [place]-decreased LOC beginning around 1300. Usually A&Ox4- now only response to verbal stimuli. Suspected PNA-CXR completed with no abnormal findings. Given 1gram of Rocephin. Temp 97.7 F. Has non-productive "wet cough." Lungs clear bilaterally. Hx COPD. On 4 L O2 [place] -O2 saturations 94%. 12 lead showing paced rhythm. No PIV access. CBG 61 mg/dl.

## 2015-05-12 NOTE — Progress Notes (Signed)
ANTIBIOTIC CONSULT NOTE - INITIAL  Pharmacy Consult for Vancomycin  Indication: Sepsis  Allergies  Allergen Reactions  . Codeine     REACTION: unspecified  . Doxycycline   . Guaifenesin & Derivatives   . Penicillins     REACTION: unspecified    Patient Measurements:   Adjusted Body Weight:   Vital Signs: Temp: 99.4 F (37.4 C) (08/29 1913) Temp Source: Rectal (08/29 1913) BP: 132/81 mmHg (08/29 1913) Pulse Rate: 72 (08/29 1913) Intake/Output from previous day:   Intake/Output from this shift:    Labs:  Recent Labs  May 15, 2015 1933 2015/05/15 1945  WBC 12.7*  --   HGB 10.2* 11.9*  PLT 176  --   CREATININE 5.67* 5.40*   Estimated Creatinine Clearance: 5.9 mL/min (by C-G formula based on Cr of 5.4). No results for input(s): VANCOTROUGH, VANCOPEAK, VANCORANDOM, GENTTROUGH, GENTPEAK, GENTRANDOM, TOBRATROUGH, TOBRAPEAK, TOBRARND, AMIKACINPEAK, AMIKACINTROU, AMIKACIN in the last 72 hours.   Microbiology: No results found for this or any previous visit (from the past 720 hour(s)).  Medical History: Past Medical History  Diagnosis Date  . COPD (chronic obstructive pulmonary disease)   . Diastolic congestive heart failure   . C. difficile colitis   . Hypertension   . Hyperlipidemia   . CAD (coronary artery disease)   . Anemia   . Type 2 diabetes mellitus   . Pacemaker 2002  . Cardiac arrest - asystole 2002  . Gallstones   . Pulmonary hypertension   . Renal insufficiency   . Osteoarthritis   . Allergic rhinitis   . Gout   . Stasis dermatitis   . Sinoatrial node dysfunction   . Syncope   . Postsurgical percutaneous transluminal coronary angioplasty status   . Dyslipidemia   . Acute bronchitis   . History of cholelithiasis   . Obesity    Assessment: 87 yoF from NH with decreased level of consciousness and cough. PMHx COPD, diastolic CHF, HTN, DM, and CAD. CT negative for acute findings.  Chest x-ray shows acute CHF, negative for PNA. Noted to be in ARF and  hypotensive despite fluids. Pharmacy consulted to start vancomycin for sepsis.    Anti-infectives 8/29 >> Vancomycin  >> / >>  >>    Vitals/Labs WBC: Elevated 12.7K Tm24h: 99.4 SCr: 5.4, CrCl ~6 ml/min  Cultures 8/29 bloodx2: IP 8/29 urine: IP   Goal of Therapy:  Vancomycin trough level 15-20 mcg/ml  Plan:  Vancomycin  IV x 1, then  IV q48h F/u additional antibiotic orders for sepsis (noted PCN allergy)  Haynes Hoehn, PharmD, BCPS 2015-05-15, 10:00 PM  Pager: [phone]

## 2015-05-12 NOTE — ED Notes (Signed)
Verbal order from Joneen Roach, NP to stop Propofol drip until BP increases.

## 2015-05-12 NOTE — ED Notes (Signed)
Patient transported to CT 

## 2015-05-13 ENCOUNTER — Inpatient Hospital Stay (HOSPITAL_COMMUNITY): Payer: Medicare Other

## 2015-05-13 ENCOUNTER — Encounter (HOSPITAL_COMMUNITY): Payer: Self-pay | Admitting: Primary Care

## 2015-05-13 DIAGNOSIS — J9601 Acute respiratory failure with hypoxia: Secondary | ICD-10-CM

## 2015-05-13 DIAGNOSIS — R570 Cardiogenic shock: Secondary | ICD-10-CM

## 2015-05-13 DIAGNOSIS — Z515 Encounter for palliative care: Secondary | ICD-10-CM

## 2015-05-13 DIAGNOSIS — Z7189 Other specified counseling: Secondary | ICD-10-CM

## 2015-05-13 DIAGNOSIS — J810 Acute pulmonary edema: Secondary | ICD-10-CM

## 2015-05-13 DIAGNOSIS — N179 Acute kidney failure, unspecified: Secondary | ICD-10-CM

## 2015-05-13 DIAGNOSIS — J9690 Respiratory failure, unspecified, unspecified whether with hypoxia or hypercapnia: Secondary | ICD-10-CM | POA: Insufficient documentation

## 2015-05-13 DIAGNOSIS — T85598A Other mechanical complication of other gastrointestinal prosthetic devices, implants and grafts, initial encounter: Secondary | ICD-10-CM | POA: Insufficient documentation

## 2015-05-13 DIAGNOSIS — I509 Heart failure, unspecified: Secondary | ICD-10-CM

## 2015-05-13 LAB — GLUCOSE, CAPILLARY
GLUCOSE-CAPILLARY: 123 mg/dL — AB (ref 65–99)
GLUCOSE-CAPILLARY: 149 mg/dL — AB (ref 65–99)
GLUCOSE-CAPILLARY: 171 mg/dL — AB (ref 65–99)
GLUCOSE-CAPILLARY: 185 mg/dL — AB (ref 65–99)
GLUCOSE-CAPILLARY: 189 mg/dL — AB (ref 65–99)
Glucose-Capillary: 165 mg/dL — ABNORMAL HIGH (ref 65–99)

## 2015-05-13 LAB — BLOOD GAS, ARTERIAL
ACID-BASE DEFICIT: 7.3 mmol/L — AB (ref 0.0–2.0)
Acid-base deficit: 11.4 mmol/L — ABNORMAL HIGH (ref 0.0–2.0)
Acid-base deficit: 8 mmol/L — ABNORMAL HIGH (ref 0.0–2.0)
BICARBONATE: 15.9 meq/L — AB (ref 20.0–24.0)
Bicarbonate: 18.1 mEq/L — ABNORMAL LOW (ref 20.0–24.0)
Bicarbonate: 17 mEq/L — ABNORMAL LOW (ref 20.0–24.0)
DRAWN BY: 422461
DRAWN BY: 24486
DRAWN BY: 301361
FIO2: 0.8
FIO2: 0.7
FIO2: 0.4
MECHVT: 350 mL
MECHVT: 350 mL
MECHVT: 350 mL
O2 SAT: 98.4 %
O2 Saturation: 99 %
O2 Saturation: 95.9 %
PATIENT TEMPERATURE: 99.4
PCO2 ART: 44.5 mmHg (ref 35.0–45.0)
PEEP/CPAP: 5 cmH2O
PEEP: 5 cmH2O
PEEP: 5 cmH2O
PH ART: 7.182 — AB (ref 7.350–7.450)
PH ART: 7.295 — AB (ref 7.350–7.450)
PO2 ART: 186 mmHg — AB (ref 80.0–100.0)
PO2 ART: 143 mmHg — AB (ref 80.0–100.0)
PO2 ART: 80 mmHg (ref 80.0–100.0)
Patient temperature: 97.9
Patient temperature: 98.6
RATE: 26 resp/min
RATE: 26 resp/min
RATE: 26 resp/min
TCO2: 15.4 mmol/L (ref 0–100)
TCO2: 19.3 mmol/L (ref 0–100)
TCO2: 18.1 mmol/L (ref 0–100)
pCO2 arterial: 38.1 mmHg (ref 35.0–45.0)
pCO2 arterial: 34.5 mmHg — ABNORMAL LOW (ref 35.0–45.0)
pH, Arterial: 7.314 — ABNORMAL LOW (ref 7.350–7.450)

## 2015-05-13 LAB — PROTIME-INR
INR: 1.82 — ABNORMAL HIGH (ref 0.00–1.49)
Prothrombin Time: 21 seconds — ABNORMAL HIGH (ref 11.6–15.2)

## 2015-05-13 LAB — URINE CULTURE: Culture: NO GROWTH

## 2015-05-13 LAB — CARBOXYHEMOGLOBIN
Carboxyhemoglobin: 0.5 % (ref 0.5–1.5)
METHEMOGLOBIN: 1.1 % (ref 0.0–1.5)
O2 Saturation: 79.7 %
TOTAL HEMOGLOBIN: 10.3 g/dL — AB (ref 12.0–16.0)

## 2015-05-13 LAB — COMPREHENSIVE METABOLIC PANEL
ALBUMIN: 2.2 g/dL — AB (ref 3.5–5.0)
ALT: 17 U/L (ref 14–54)
ANION GAP: 15 (ref 5–15)
AST: 66 U/L — ABNORMAL HIGH (ref 15–41)
Alkaline Phosphatase: 111 U/L (ref 38–126)
BILIRUBIN TOTAL: 0.6 mg/dL (ref 0.3–1.2)
BUN: 114 mg/dL — ABNORMAL HIGH (ref 6–20)
CALCIUM: 7.4 mg/dL — AB (ref 8.9–10.3)
CO2: 18 mmol/L — ABNORMAL LOW (ref 22–32)
Chloride: 102 mmol/L (ref 101–111)
Creatinine, Ser: 4.87 mg/dL — ABNORMAL HIGH (ref 0.44–1.00)
GFR calc non Af Amer: 7 mL/min — ABNORMAL LOW (ref >60)
GFR, EST AFRICAN AMERICAN: 9 mL/min — AB (ref >60)
GLUCOSE: 101 mg/dL — AB (ref 65–99)
POTASSIUM: 3.7 mmol/L (ref 3.5–5.1)
SODIUM: 135 mmol/L (ref 135–145)
TOTAL PROTEIN: 5.8 g/dL — AB (ref 6.5–8.1)

## 2015-05-13 LAB — BASIC METABOLIC PANEL
ANION GAP: 17 — AB (ref 5–15)
ANION GAP: 16 — AB (ref 5–15)
ANION GAP: 13 (ref 5–15)
BUN: 116 mg/dL — ABNORMAL HIGH (ref 6–20)
BUN: 119 mg/dL — ABNORMAL HIGH (ref 6–20)
BUN: 84 mg/dL — ABNORMAL HIGH (ref 6–20)
CALCIUM: 7.2 mg/dL — AB (ref 8.9–10.3)
CHLORIDE: 98 mmol/L — AB (ref 101–111)
CO2: 19 mmol/L — AB (ref 22–32)
CO2: 20 mmol/L — ABNORMAL LOW (ref 22–32)
CO2: 22 mmol/L (ref 22–32)
CREATININE: 5.14 mg/dL — AB (ref 0.44–1.00)
Calcium: 7.3 mg/dL — ABNORMAL LOW (ref 8.9–10.3)
Calcium: 7.3 mg/dL — ABNORMAL LOW (ref 8.9–10.3)
Chloride: 99 mmol/L — ABNORMAL LOW (ref 101–111)
Chloride: 97 mmol/L — ABNORMAL LOW (ref 101–111)
Creatinine, Ser: 5.23 mg/dL — ABNORMAL HIGH (ref 0.44–1.00)
Creatinine, Ser: 3.73 mg/dL — ABNORMAL HIGH (ref 0.44–1.00)
GFR calc Af Amer: 8 mL/min — ABNORMAL LOW (ref >60)
GFR calc non Af Amer: 7 mL/min — ABNORMAL LOW (ref >60)
GFR, EST AFRICAN AMERICAN: 8 mL/min — AB (ref >60)
GFR, EST AFRICAN AMERICAN: 12 mL/min — AB (ref >60)
GFR, EST NON AFRICAN AMERICAN: 7 mL/min — AB (ref >60)
GFR, EST NON AFRICAN AMERICAN: 10 mL/min — AB (ref >60)
Glucose, Bld: 150 mg/dL — ABNORMAL HIGH (ref 65–99)
Glucose, Bld: 119 mg/dL — ABNORMAL HIGH (ref 65–99)
Glucose, Bld: 218 mg/dL — ABNORMAL HIGH (ref 65–99)
POTASSIUM: 4.4 mmol/L (ref 3.5–5.1)
POTASSIUM: 4.1 mmol/L (ref 3.5–5.1)
Potassium: 3.9 mmol/L (ref 3.5–5.1)
SODIUM: 134 mmol/L — AB (ref 135–145)
SODIUM: 135 mmol/L (ref 135–145)
SODIUM: 132 mmol/L — AB (ref 135–145)

## 2015-05-13 LAB — PROCALCITONIN: Procalcitonin: 13.19 ng/mL

## 2015-05-13 LAB — MRSA PCR SCREENING: MRSA BY PCR: POSITIVE — AB

## 2015-05-13 LAB — LACTIC ACID, PLASMA
LACTIC ACID, VENOUS: 2.1 mmol/L — AB (ref 0.5–2.0)
LACTIC ACID, VENOUS: 1.9 mmol/L (ref 0.5–2.0)

## 2015-05-13 LAB — TROPONIN I: TROPONIN I: 0.07 ng/mL — AB (ref <0.031)

## 2015-05-13 LAB — SODIUM, URINE, RANDOM: SODIUM UR: 39 mmol/L

## 2015-05-13 LAB — STREP PNEUMONIAE URINARY ANTIGEN: Strep Pneumo Urinary Antigen: NEGATIVE

## 2015-05-13 LAB — CORTISOL: Cortisol, Plasma: 60 ug/dL

## 2015-05-13 LAB — CREATININE, URINE, RANDOM: CREATININE, URINE: 128.76 mg/dL

## 2015-05-13 LAB — MAGNESIUM: Magnesium: 2.1 mg/dL (ref 1.7–2.4)

## 2015-05-13 LAB — PHOSPHORUS: PHOSPHORUS: 6.3 mg/dL — AB (ref 2.5–4.6)

## 2015-05-13 MED ORDER — VANCOMYCIN HCL IN DEXTROSE 750-5 MG/150ML-% IV SOLN
750.0000 mg | INTRAVENOUS | Status: DC
  Administered 2015-05-14 – 2015-05-18 (×5): 750 mg via INTRAVENOUS
  Filled 2015-05-13 (×6): qty 150

## 2015-05-13 MED ORDER — NOREPINEPHRINE BITARTRATE 1 MG/ML IV SOLN
0.0000 ug/min | INTRAVENOUS | Status: DC
  Administered 2015-05-13 (×2): 10 ug/min via INTRAVENOUS
  Administered 2015-05-14: 8 ug/min via INTRAVENOUS
  Filled 2015-05-13 (×2): qty 4

## 2015-05-13 MED ORDER — AZTREONAM 1 G IJ SOLR
1.0000 g | Freq: Three times a day (TID) | INTRAMUSCULAR | Status: DC
  Administered 2015-05-14 – 2015-05-18 (×15): 1 g via INTRAVENOUS
  Filled 2015-05-13 (×17): qty 1

## 2015-05-13 MED ORDER — FENTANYL CITRATE (PF) 100 MCG/2ML IJ SOLN
INTRAMUSCULAR | Status: AC
  Filled 2015-05-13: qty 2

## 2015-05-13 MED ORDER — FENTANYL CITRATE (PF) 100 MCG/2ML IJ SOLN
50.0000 ug | INTRAMUSCULAR | Status: DC | PRN
  Administered 2015-05-13 – 2015-05-14 (×5): 50 ug via INTRAVENOUS
  Filled 2015-05-13 (×4): qty 2

## 2015-05-13 MED ORDER — HEPARIN 1000 UNIT/ML FOR PERITONEAL DIALYSIS
1.2000 mL | INTRAMUSCULAR | Status: DC | PRN
  Filled 2015-05-13 (×4): qty 1.2

## 2015-05-13 MED ORDER — INSULIN ASPART 100 UNIT/ML ~~LOC~~ SOLN: 0.0000 [IU] | SUBCUTANEOUS | Status: DC

## 2015-05-13 MED ORDER — SODIUM CHLORIDE 0.9 % FOR CRRT
INTRAVENOUS_CENTRAL | Status: DC | PRN
  Filled 2015-05-13: qty 1000

## 2015-05-13 MED ORDER — NOREPINEPHRINE BITARTRATE 1 MG/ML IV SOLN
0.0000 ug/min | Freq: Once | INTRAVENOUS | Status: AC
  Administered 2015-05-13: 9.5 ug/min via INTRAVENOUS
  Filled 2015-05-13: qty 4

## 2015-05-13 MED ORDER — INSULIN ASPART 100 UNIT/ML ~~LOC~~ SOLN
0.0000 [IU] | SUBCUTANEOUS | Status: DC
  Administered 2015-05-13 – 2015-05-14 (×3): 2 [IU] via SUBCUTANEOUS
  Administered 2015-05-14 – 2015-05-18 (×12): 1 [IU] via SUBCUTANEOUS

## 2015-05-13 MED ORDER — LEVOFLOXACIN IN D5W 250 MG/50ML IV SOLN
250.0000 mg | INTRAVENOUS | Status: DC
  Administered 2015-05-14 – 2015-05-18 (×5): 250 mg via INTRAVENOUS
  Filled 2015-05-13 (×6): qty 50

## 2015-05-13 MED ORDER — CHLORHEXIDINE GLUCONATE CLOTH 2 % EX PADS
6.0000 | MEDICATED_PAD | Freq: Every day | CUTANEOUS | Status: AC
  Administered 2015-05-13 – 2015-05-17 (×5): 6 via TOPICAL

## 2015-05-13 MED ORDER — VASOPRESSIN 20 UNIT/ML IV SOLN
0.0300 [IU]/min | INTRAVENOUS | Status: DC
  Administered 2015-05-13 – 2015-05-14 (×2): 0.03 [IU]/min via INTRAVENOUS
  Filled 2015-05-13 (×3): qty 2

## 2015-05-13 MED ORDER — PRISMASOL BGK 4/2.5 32-4-2.5 MEQ/L IV SOLN
INTRAVENOUS | Status: DC
  Administered 2015-05-13 – 2015-05-18 (×24): via INTRAVENOUS_CENTRAL
  Filled 2015-05-13 (×28): qty 5000

## 2015-05-13 MED ORDER — LEVOFLOXACIN IN D5W 500 MG/100ML IV SOLN
500.0000 mg | INTRAVENOUS | Status: DC
Start: 2015-05-15 — End: 2015-05-13

## 2015-05-13 MED ORDER — HEPARIN SODIUM (PORCINE) 1000 UNIT/ML DIALYSIS
1000.0000 [IU] | INTRAMUSCULAR | Status: DC | PRN
  Filled 2015-05-13: qty 6

## 2015-05-13 MED ORDER — DEXTROSE 5 % IV SOLN
500.0000 mg | Freq: Three times a day (TID) | INTRAVENOUS | Status: DC
  Administered 2015-05-13 (×3): 500 mg via INTRAVENOUS
  Filled 2015-05-13 (×5): qty 0.5

## 2015-05-13 MED ORDER — PRISMASOL BGK 4/2.5 32-4-2.5 MEQ/L IV SOLN
INTRAVENOUS | Status: DC
  Administered 2015-05-13 – 2015-05-18 (×14): via INTRAVENOUS_CENTRAL
  Filled 2015-05-13 (×14): qty 5000

## 2015-05-13 MED ORDER — HEPARIN SODIUM (PORCINE) 1000 UNIT/ML IJ SOLN
2400.0000 [IU] | Freq: Once | INTRAMUSCULAR | Status: AC
  Administered 2015-05-13: 2400 [IU] via INTRAVENOUS
  Filled 2015-05-13: qty 2.4

## 2015-05-13 MED ORDER — ANTISEPTIC ORAL RINSE SOLUTION (CORINZ)
7.0000 mL | Freq: Four times a day (QID) | OROMUCOSAL | Status: DC
  Administered 2015-05-13 – 2015-05-18 (×23): 7 mL via OROMUCOSAL

## 2015-05-13 MED ORDER — PRISMASOL BGK 4/2.5 32-4-2.5 MEQ/L IV SOLN
INTRAVENOUS | Status: DC
  Administered 2015-05-13 – 2015-05-18 (×13): via INTRAVENOUS_CENTRAL
  Filled 2015-05-13 (×14): qty 5000

## 2015-05-13 MED ORDER — VITAL HIGH PROTEIN PO LIQD
1000.0000 mL | ORAL | Status: DC
  Administered 2015-05-13: 40 mL
  Filled 2015-05-13 (×3): qty 1000

## 2015-05-13 MED ORDER — CHLORHEXIDINE GLUCONATE 0.12% ORAL RINSE (MEDLINE KIT)
15.0000 mL | Freq: Two times a day (BID) | OROMUCOSAL | Status: DC
  Administered 2015-05-13 – 2015-05-18 (×12): 15 mL via OROMUCOSAL

## 2015-05-13 MED ORDER — MIDAZOLAM HCL 2 MG/2ML IJ SOLN
INTRAMUSCULAR | Status: AC
  Administered 2015-05-13: 1 mg
  Filled 2015-05-13: qty 2

## 2015-05-13 MED ORDER — MUPIROCIN 2 % EX OINT
1.0000 "application " | TOPICAL_OINTMENT | Freq: Two times a day (BID) | CUTANEOUS | Status: AC
  Administered 2015-05-13 – 2015-05-17 (×10): 1 via NASAL
  Filled 2015-05-13: qty 22

## 2015-05-13 MED ORDER — MIDAZOLAM HCL 2 MG/2ML IJ SOLN: 1.0000 mg | Freq: Once | INTRAMUSCULAR | Status: AC

## 2015-05-13 MED ORDER — VITAL AF 1.2 CAL PO LIQD
1000.0000 mL | ORAL | Status: DC
  Administered 2015-05-13: 45 mL
  Administered 2015-05-14 – 2015-05-18 (×4): 1000 mL
  Filled 2015-05-13 (×9): qty 1000

## 2015-05-13 MED ORDER — SODIUM BICARBONATE 8.4 % IV SOLN
100.0000 meq | Freq: Once | INTRAVENOUS | Status: AC
  Administered 2015-05-13: 100 meq via INTRAVENOUS
  Filled 2015-05-13: qty 100

## 2015-05-13 MED ORDER — LEVOFLOXACIN IN D5W 750 MG/150ML IV SOLN
750.0000 mg | INTRAVENOUS | Status: AC
  Administered 2015-05-13: 750 mg via INTRAVENOUS
  Filled 2015-05-13: qty 150

## 2015-05-13 MED ORDER — HYDROCORTISONE NA SUCCINATE PF 100 MG IJ SOLR
50.0000 mg | Freq: Four times a day (QID) | INTRAMUSCULAR | Status: DC
  Administered 2015-05-13: 50 mg via INTRAVENOUS
  Filled 2015-05-13 (×2): qty 2

## 2015-05-13 MED ORDER — FENTANYL CITRATE (PF) 100 MCG/2ML IJ SOLN
50.0000 ug | INTRAMUSCULAR | Status: AC | PRN
  Administered 2015-05-13 – 2015-05-14 (×3): 50 ug via INTRAVENOUS
  Filled 2015-05-13 (×2): qty 2

## 2015-05-13 NOTE — Progress Notes (Signed)
Pt seen on [facility].  On all 4K.  Keeping even for now but will try to pull 50cc/hr if BP allows.

## 2015-05-13 NOTE — Progress Notes (Signed)
*  PRELIMINARY RESULTS* Echocardiogram 2D Echocardiogram has been performed.  [name] 05/13/2015, 10:44 AM

## 2015-05-13 NOTE — Progress Notes (Signed)
ANTIBIOTIC CONSULT NOTE - INITIAL  Pharmacy Consult for Vancomycin and Aztreonam and Levaquin Indication: Sepsis  Allergies  Allergen Reactions  . Codeine     REACTION: unspecified  . Doxycycline   . Guaifenesin & Derivatives   . Penicillins     REACTION: unspecified    Patient Measurements: Height:  (149.9 cm) Weight: 141 lb 5 oz (64.1 kg) IBW/kg (Calculated) : 43.2  Vital Signs: Temp: 99.4 F (37.4 C) (08/29 1913) Temp Source: Rectal (08/29 1913) BP: 80/60 mmHg (08/30 0545) Pulse Rate: 63 (08/30 0545) Intake/Output from previous day: 08/29 0701 - 08/30 0700 In: 1420.5 [I.V.:1360.5; IV Piggyback:50] Out: 0  Intake/Output from this shift: Total I/O In: 1420.5 [I.V.:1360.5; Other:10; IV Piggyback:50] Out: 0   Labs:  Recent Labs  02-Jun-2015 1933 2015-06-02 1945 02-Jun-2015 2338 05/13/15 0414  WBC 12.7*  --   --   --   HGB 10.2* 11.9*  --   --   PLT 176  --   --   --   LABCREA  --   --   --  128.76  CREATININE 5.67* 5.40* 4.87*  --    Estimated Creatinine Clearance: 6.9 mL/min (by C-G formula based on Cr of 4.87). No results for input(s): VANCOTROUGH, VANCOPEAK, VANCORANDOM, GENTTROUGH, GENTPEAK, GENTRANDOM, TOBRATROUGH, TOBRAPEAK, TOBRARND, AMIKACINPEAK, AMIKACINTROU, AMIKACIN in the last 72 hours.   Microbiology: Recent Results (from the past 720 hour(s))  MRSA PCR Screening     Status: Abnormal   Collection Time: 05/13/15  3:09 AM  Result Value Ref Range Status   MRSA by PCR RESISTANT (A) NEGATIVE Final    Comment:        The GeneXpert MRSA Assay (FDA approved for NASAL specimens only), is one component of a comprehensive MRSA colonization surveillance program. It is not intended to diagnose MRSA infection nor to guide or monitor treatment for MRSA infections. RESULT CALLED TO, READ BACK BY AND VERIFIED WITH: B [email or web address]  05/13/15 MKELLY     Medical History: Past Medical History  Diagnosis Date  . COPD (chronic obstructive pulmonary  disease)   . Diastolic congestive heart failure   . C. difficile colitis   . Hypertension   . Hyperlipidemia   . CAD (coronary artery disease)   . Anemia   . Type 2 diabetes mellitus   . Pacemaker 2002  . Cardiac arrest - asystole 2002  . Gallstones   . Pulmonary hypertension   . Renal insufficiency   . Osteoarthritis   . Allergic rhinitis   . Gout   . Stasis dermatitis   . Sinoatrial node dysfunction   . Syncope   . Postsurgical percutaneous transluminal coronary angioplasty status   . Dyslipidemia   . Acute bronchitis   . History of cholelithiasis   . Obesity    Assessment: [name]-infectives 8/29 Vancomycin  >> 8/29 Aztreonam >> 8/30 Levaquin>>  Cultures 8/29 bloodx2: 8/29 urine:  8/30 trach asp:  Goal of Therapy:  Vancomycin trough level 15-20 mcg/ml  Plan:  Continue Vancomycin  IV q48h. Consider level ~48h post first dose to see if pt clearing Continue Aztreonam  IV q8h Levaquin  IV now then  IV q48h Will f/u renal function, micro data,  and pt's clinical condition Narrow abx when able  Christoper Fabian, PharmD, BCPS Clinical pharmacist, pager [phone] 05/13/2015, 6:00 AM

## 2015-05-13 NOTE — Procedures (Signed)
Central Venous Catheter Insertion Procedure Note [name] [id number] May 23, 1928  Procedure: Insertion of Central Venous Catheter Indications: Assessment of intravascular volume, Drug and/or fluid administration and Frequent blood sampling  Procedure Details Consent: Risks of procedure as well as the alternatives and risks of each were explained to the (patient/caregiver).  Consent for procedure obtained. Time Out: Verified patient identification, verified procedure, site/side was marked, verified correct patient position, special equipment/implants available, medications/allergies/relevent history reviewed, required imaging and test results available.  Performed  Maximum sterile technique was used including antiseptics, cap, gloves, gown, hand hygiene, mask and sheet. Skin prep: Chlorhexidine; local anesthetic administered A antimicrobial bonded/coated triple lumen catheter was placed in the left internal jugular vein using the Seldinger technique. Ultrasound guidance used.Yes.   Catheter placed to 20 cm. Blood aspirated via all 3 ports and then flushed x 3. Line sutured x 2 and dressing applied.  Evaluation Blood flow good Complications: No apparent complications Patient did tolerate procedure well. Chest X-ray ordered to verify placement.  CXR: pending.  Brett Canales Minor ACNP [facility] PCCM Pager [phone] till 3 pm If no answer page [phone] 05/13/2015, 12:47 PM   I was present and supervised the entire procedure.  Alyson Reedy, M.D. [facility] Pulmonary/Critical Care Medicine. Pager: [phone]. After hours pager: [phone].

## 2015-05-13 NOTE — Progress Notes (Signed)
PULMONARY / CRITICAL CARE MEDICINE   Name: [name] MRN: [id number] DOB: 20-Jun-1928    ADMISSION DATE:  05/13/2015 CONSULTATION DATE:  05/07/2015  REFERRING MD :  EDP  CHIEF COMPLAINT:  AMS  INITIAL PRESENTATION: 81 year old female from [place] presented to [facility] ED 8/29 with AMS. Recent complaints of respiratory distress and confusion. In ED was intubated for airway protection. Found to be in renal failure with pulmonary edema on CXR. PCCM to admit.   STUDIES:  8/29 CT head > no acute findings  SIGNIFICANT EVENTS: 8/26 > some worsening respiratory distress 8/29 > admitted to ICU, intubated  HISTORY OF PRESENT ILLNESS:  81 year old female with multiple medical problems for [facility]. Her PMH is outlined below, and is significant for COPD (GOLD D, on home O2 3L, followed by PW), CAD with h/o arrest, Diastolic CHF (followed by Dr. Antoine Poche), she has pacemaker and recent interrogations show PAF (not currently on anticoagulation), DM 2, and PAH. She also has stage 4 CKD, and has reportedly not wanted dialysis in the past. At baseline she is somewhat independent. Recently she has been confined to wheelchair, but brother reports that she is able to dress herself and walk short distances. She considers this favorable according to brother. Her son is the Management consultant, but is out of town on vacation this week. He and the brother have been communicating and making decisions together.   Her brother visited her at [facility] 8/25 or 8/26 (he can't remember) and noted her to be in some acute respiratory distress. He described an audible wheeze. She had cough, but it was non-productive and no worse than her chronic baseline cough. He is not sure if anything was done to address this. It appears as though she was recently started on cipro, although the reasoning for that is unclear. Since that time she has had a steady decline in mental status, which reportedly accelerated today 8/29 at about 1pm. She  was evaluated by the facility's physician who decided to transfer her to ED. In ED she was profoundly altered and somewhat combative. She moved all 4 extremities equally, but was non-purposeful.  and intubated for airway protection. CXR was ordered, and was consistent with pulmonary edema. Lab work was significant for kidney injury (SCr 5.4 up from 3.5 3/16) and mild leukocytosis (12). No significant electrolyte abnormalities. She became hypotensive and was started on levophed. PCCM contacted for admission.   SUBJECTIVE: On vent, unresponsive, no UOP overnight.  VITAL SIGNS: Temp:  [97.5 F (36.4 C)-99.4 F (37.4 C)] 97.5 F (36.4 C) (08/30 0729) Pulse Rate:  [48-73] 64 (08/30 1045) Resp:  [12-31] 26 (08/30 1045) BP: (67-137)/(33-112) 97/52 mmHg (08/30 1045) SpO2:  [90 %-100 %] 99 % (08/30 1045) FiO2 (%):  [40 %-100 %] 40 % (08/30 0913) Weight:  [64.1 kg (141 lb 5 oz)] 64.1 kg (141 lb 5 oz) (08/30 0318) HEMODYNAMICS: CVP:  [17 mmHg-18 mmHg] 18 mmHg VENTILATOR SETTINGS: Vent Mode:  [-] PRVC FiO2 (%):  [40 %-100 %] 40 % Set Rate:  [18 bmp-26 bmp] 26 bmp Vt Set:  [350 mL] 350 mL PEEP:  [5 cmH20] 5 cmH20 Plateau Pressure:  [17 cmH20-21 cmH20] 17 cmH20 INTAKE / OUTPUT:  Intake/Output Summary (Last 24 hours) at 05/13/15 1122 Last data filed at 05/13/15 1000  Gross per 24 hour  Intake   1830 ml  Output     10 ml  Net   1820 ml   PHYSICAL EXAMINATION: General:  Elderly, chronically ill appearing female in NAD Neuro:  GCS 3T on no sedation. PERRL. HEENT:  [place]/AT, no JVD noted. Cardiovascular:  Paced on tele. RRR, no MRG Lungs:  Coarse bilateral breath sounds Abdomen:  Soft, non-distended Musculoskeletal:  No acute deformity, +2 pitting edema to BLE Skin:  Grossly intact  LABS:  CBC  Recent Labs Lab 04/20/2015 1933 04/28/2015 1945  WBC 12.7*  --   HGB 10.2* 11.9*  HCT 32.0* 35.0*  PLT 176  --    Coag's  Recent Labs Lab 04/28/2015 2338  INR 1.82*   BMET  Recent  Labs Lab 04/28/2015 1933 04/17/2015 1945 04/29/2015 2338 05/13/15 1001  NA 133* 130* 135 134*  K 4.0 4.0 3.7 3.9  CL 93* 96* 102 98*  CO2 21*  --  18* 19*  BUN 124* 117* 114* 116*  CREATININE 5.67* 5.40* 4.87* 5.14*  GLUCOSE 69 65 101* 150*   Electrolytes  Recent Labs Lab 04/29/2015 1933 05/06/2015 2338 05/13/15 1001  CALCIUM 8.5* 7.4* 7.2*  MG  --  2.1  --   PHOS  --  6.3*  --    Sepsis Markers  Recent Labs Lab 05/03/2015 1944 05/11/2015 2338 05/13/15 0802  LATICACIDVEN 1.99 1.9 2.1*  PROCALCITON  --  13.19  --    ABG  Recent Labs Lab 05/13/15 0127 05/13/15 0530 05/13/15 0809  PHART 7.182* 7.295* 7.314*  PCO2ART 44.5 38.1 34.5*  PO2ART 186* 143* 80.0   Liver Enzymes  Recent Labs Lab 04/23/2015 1933 04/17/2015 2338  AST 58* 66*  ALT 18 17  ALKPHOS 142* 111  BILITOT 1.0 0.6  ALBUMIN 2.9* 2.2*   Cardiac Enzymes  Recent Labs Lab 05/09/2015 2338  TROPONINI 0.07*   Glucose  Recent Labs Lab 05/11/2015 1917 05/13/15 0310 05/13/15 0726  GLUCAP 67 123* 165*   Imaging Dg Chest 1 View  05/13/2015   CLINICAL DATA:  Central line placement  EXAM: CHEST  1 VIEW  COMPARISON:  05/01/2015  FINDINGS: The endotracheal tube is 4.6 cm above the carina. There is a new right jugular central line extending into the SVC. There are intact appearances of the transvenous leads. There is no pneumothorax. Mild left base consolidation and small effusion persist without significant interval change. Diffuse bilateral airspace opacities also persist unchanged.  IMPRESSION: New right jugular central line. No pneumothorax. No other significant interval change.   Electronically Signed   By: Ellery Plunk M.D.   On: 05/13/2015 01:25   Dg Chest 1 View  04/22/2015   CLINICAL DATA:  Nursing home patient with acute onset of mental status changes and nonproductive cough earlier today around 1300 hr. Former smoker. Current history of COPD, diastolic CHF, hypertension, diabetes and coronary artery  disease.  EXAM: CHEST  1 VIEW  COMPARISON:  09/02/2014 and earlier.  FINDINGS: Cardiac silhouette markedly enlarged. Mild-to-moderate diffuse interstitial and airspace pulmonary edema, asymmetric and increased in the right lung. Associated small bilateral pleural effusions. Stable linear scarring at the left lung base. Left subclavian dual lead transvenous pacemaker unchanged and appears intact.  IMPRESSION: Acute CHF, with stable marked cardiomegaly and mild to moderate diffuse interstitial and airspace pulmonary edema, asymmetric and increased on the right. Associated small bilateral pleural effusions.   Electronically Signed   By: Hulan Saas M.D.   On: 05/09/2015 19:57   Ct Head Wo Contrast  04/22/2015   CLINICAL DATA:  Decreased level of consciousness today.  EXAM: CT HEAD WITHOUT CONTRAST  TECHNIQUE: Contiguous axial images were obtained  from the base of the skull through the vertex without intravenous contrast.  COMPARISON:  09/14/2013  FINDINGS: Stable age related cerebral atrophy, ventriculomegaly and periventricular white matter disease. No extra-axial fluid collections are identified. No CT findings for acute hemispheric infarction or intracranial hemorrhage. No mass lesions. Probable remote white matter infarct in the right frontal area. The brainstem and cerebellum are normal.  No acute bony findings. The paranasal sinuses and mastoid air cells are clear.  IMPRESSION: Stable age related cerebral atrophy, ventriculomegaly and periventricular white matter disease. No acute intracranial findings or mass lesion.   Electronically Signed   By: Rudie Meyer M.D.   On: 05/07/2015 20:18   US Renal  05/13/2015   CLINICAL DATA:  Acute kidney injury  EXAM: RENAL / URINARY TRACT ULTRASOUND COMPLETE  COMPARISON:  None.  FINDINGS: Right Kidney:  Length: 9.8 cm. Increased parenchymal echogenicity consistent with medical renal disease. Benign 3.6 cm upper pole cyst. No hydronephrosis.  Left Kidney:  Length:  9.2 cm. Increased parenchymal echogenicity consistent with medical renal disease. No hydronephrosis. Trace perinephric fluid.  Bladder:  Collapsed around a Foley catheter.  IMPRESSION: Negative for hydronephrosis. Increased parenchymal echogenicity suggesting medical renal disease.   Electronically Signed   By: Ellery Plunk M.D.   On: 05/13/2015 01:42   Dg Chest Port 1 View  05/13/2015   CLINICAL DATA:  Respiratory failure. Assess endotracheal tube placement. Subsequent encounter.  EXAM: PORTABLE CHEST - 1 VIEW  COMPARISON:  Chest radiograph performed earlier today at 1:09 a.m.  FINDINGS: The patient's endotracheal tube is seen ending 4 cm above the carina. An enteric tube is noted extending below the diaphragm. A right IJ line is noted ending about the mid SVC.  Small bilateral pleural effusions are noted, left greater than right, with increased interstitial markings, likely reflecting mild pulmonary edema. Underlying vascular congestion is seen. No pneumothorax is identified.  The cardiomediastinal silhouette is enlarged. A pacemaker is noted overlying the left chest wall, with leads ending overlying the right atrium and right ventricle. No acute osseous abnormalities are identified.  IMPRESSION: 1. Endotracheal tube seen ending 4 cm above the carina. 2. Small bilateral pleural effusions, left greater than right, with increased interstitial markings, likely reflecting mild pulmonary edema. Vascular congestion and cardiomegaly noted.   Electronically Signed   By: Roanna Raider M.D.   On: 05/13/2015 03:25   Dg Chest Port 1 View  05/14/2015   CLINICAL DATA:  Endotracheal tube placement.  Initial encounter.  EXAM: PORTABLE CHEST - 1 VIEW  COMPARISON:  Chest radiograph performed earlier today at 7:47 p.m.  FINDINGS: The patient's endotracheal tube is seen ending 2-3 cm above the carina.  A small left pleural effusion is noted. Diffuse bilateral airspace opacification is mildly worsened from the prior study  and may reflect multifocal pneumonia or possibly pulmonary edema. No pneumothorax is seen.  The cardiomediastinal silhouette is mildly enlarged. No acute osseous abnormalities are identified. A pacemaker is noted overlying the left chest wall, with leads ending overlying the right atrium and right ventricle.  IMPRESSION: 1. Endotracheal tube seen ending 2-3 cm above the carina. 2. Small left pleural effusion noted. Diffuse bilateral airspace opacification is mildly worsened from the prior study and may reflect multifocal pneumonia or possibly pulmonary edema. Mild cardiomegaly noted.   Electronically Signed   By: Roanna Raider M.D.   On: 04/17/2015 21:42   Dg Abd Portable 1v  05/13/2015   CLINICAL DATA:  Obstructed nasogastric tube  EXAM: PORTABLE ABDOMEN -  1 VIEW  COMPARISON:  September 15, 2013  FINDINGS: Nasogastric tube tip and side port are in the region of the distal stomach. Bowel gas pattern is unremarkable. No obstruction or free air apparent. There are multiple foci of arterial vascular calcification. There is lumbar levoscoliosis, stable.  IMPRESSION: Bowel gas pattern unremarkable. Nasogastric tube tip and side port in distal stomach.   Electronically Signed   By: Bretta Bang III M.D.   On: 05/13/2015 09:10   ASSESSMENT / PLAN:  PULMONARY OETT  8/29 >>> A: Acute on chronic hypercarbic respiratory failure COPD without acute exacerbation R/o PNA Pulmonary edema concerns  P:   Full vent support Will need volume off but not now while hypotensive. Follow CVP. Follow CXR, ABG. Nebulized bronchodilators VAP bundle  CARDIOVASCULAR R IJ HD catheter 8/29 >>> A:  Shock, suspect cardiogenic. Less likely septic Acute on chronic diastolic CHF PAF (not on anticoagulation), currently paced H/o CAD, cardiac arrest Pacemaker status  P:  Telemetry monitoring MAP goal > 68mm/hg Follow  Levophed titrate for MAP goal, may need dopamine if stays low HR Add vasopressin. 2D Echo in AM   CVP monitoring  RENAL A:   Acute on CKD Hyponatremia, mild Volume overload  P:   Follow Bmet with hydration and BP support. Patient is not a dialysis candidate in my opinion as she has refused this in the past. Consult nephrology called, will defer dialysis decision to nephrology. Renal US pending.  GASTROINTESTINAL A:   No acute issues  P:   Consult nutrition for TF as per nutrition. PPI  HEMATOLOGIC A:   Anemia of chronic illness  P:  Follow CBC Heparin sq for VTE ppx  INFECTIOUS A:   ? Septic shock, marginally meets SIRS. Source unclear. Favor HCAP ? UTI/hyrdonephrosis, is anuric presently P:   BCx2 8/29 >>> UC 8/29 >>> Sputum 8/29 >>>  Abx: Aztreonam, start date 8/29 >>> Abx: Vancomycin, start date 8/29 >>> PCT 13.19 Follow WBC and fever curve  ENDOCRINE A:   DM2   P:   CBG monitoring and SSI. Cortisol 60 no need for stress dose steroids.  NEUROLOGIC A:   Acute metabolic encephalopathy  P:   RASS goal: -1 PRN fentanyl. Avoid benzo given renal function.  FAMILY  - Updates: Brother updated bedside, recommend DNR status and no further escalation.  He would like to speak with son and will let us know decision.  Nephrology consult called, not a dialysis candidate in my opinion given her over all health, functional status and the fact that she refused dialysis in the past.  - Inter-disciplinary family meet or Palliative Care meeting due by:  9/5  The patient is critically ill with multiple organ systems failure and requires high complexity decision making for assessment and support, frequent evaluation and titration of therapies, application of advanced monitoring technologies and extensive interpretation of multiple databases.   Critical Care Time devoted to patient care services described in this note is  35  Minutes. This time reflects time of care of this signee Dr Koren Bound. This critical care time does not reflect procedure time, or  teaching time or supervisory time of PA/NP/Med student/Med Resident etc but could involve care discussion time.  Alyson Reedy, M.D. [facility] Pulmonary/Critical Care Medicine. Pager: [phone]. After hours pager: [phone].

## 2015-05-13 NOTE — Progress Notes (Signed)
ANTIBIOTIC CONSULT NOTE - INITIAL  Pharmacy Consult for Vancomycin and Aztreonam and Levaquin Indication: Sepsis  Allergies  Allergen Reactions  . Codeine     REACTION: unspecified  . Doxycycline   . Guaifenesin & Derivatives   . Penicillins     REACTION: unspecified    Patient Measurements: Height: 4\' 11"  (149.9 cm) Weight: 141 lb 5 oz (64.1 kg) IBW/kg (Calculated) : 43.2  Vital Signs: Temp: 97.6 F (36.4 C) (08/30 1537) Temp Source: Oral (08/30 1537) BP: 117/74 mmHg (08/30 1545) Pulse Rate: 59 (08/30 1545) Intake/Output from previous day: 08/29 0701 - 08/30 0700 In: 1637.5 [I.V.:1427.5; IV Piggyback:200] Out: 0  Intake/Output from this shift: Total I/O In: 566.5 [I.V.:376.5; NG/GT:140; IV Piggyback:50] Out: 25 [Urine:25]  Labs:  Recent Labs  04/24/2015 1933 04/23/2015 1945 05/01/2015 2338 05/13/15 0414 05/13/15 1001 05/13/15 1347  WBC 12.7*  --   --   --   --   --   HGB 10.2* 11.9*  --   --   --   --   PLT 176  --   --   --   --   --   LABCREA  --   --   --  128.76  --   --   CREATININE 5.67* 5.40* 4.87*  --  5.14* 5.23*   Estimated Creatinine Clearance: 6.4 mL/min (by C-G formula based on Cr of 5.23). No results for input(s): VANCOTROUGH, VANCOPEAK, VANCORANDOM, GENTTROUGH, GENTPEAK, GENTRANDOM, TOBRATROUGH, TOBRAPEAK, TOBRARND, AMIKACINPEAK, AMIKACINTROU, AMIKACIN in the last 72 hours.   Microbiology: Recent Results (from the past 720 hour(s))  Culture, blood (routine x 2)     Status: None (Preliminary result)   Collection Time: 04/24/2015  7:30 PM  Result Value Ref Range Status   Specimen Description BLOOD RIGHT ANTECUBITAL  Final   Special Requests BOTTLES DRAWN AEROBIC AND ANAEROBIC 5CC  Final   Culture   Final    NO GROWTH < 24 HOURS Performed at [facility]    Report Status PENDING  Incomplete  Urine culture     Status: None (Preliminary result)   Collection Time: 04/25/2015  7:35 PM  Result Value Ref Range Status   Specimen Description  URINE, CATHETERIZED  Final   Special Requests NONE  Final   Culture   Final    NO GROWTH < 12 HOURS Performed at [facility]    Report Status PENDING  Incomplete  Culture, blood (routine x 2)     Status: None (Preliminary result)   Collection Time: 04/25/2015  7:38 PM  Result Value Ref Range Status   Specimen Description BLOOD LEFT HAND  Final   Special Requests BOTTLES DRAWN AEROBIC AND ANAEROBIC 5CC  Final   Culture   Final    NO GROWTH < 24 HOURS Performed at [facility]    Report Status PENDING  Incomplete  Culture, respiratory (tracheal aspirate)     Status: None (Preliminary result)   Collection Time: 05/13/15  1:21 AM  Result Value Ref Range Status   Specimen Description TRACHEAL ASPIRATE  Final   Special Requests NONE  Final   Gram Stain   Final    ABUNDANT WBC PRESENT, PREDOMINANTLY PMN NO SQUAMOUS EPITHELIAL CELLS SEEN ABUNDANT GRAM NEGATIVE RODS RARE GRAM POSITIVE COCCI IN PAIRS    Culture PENDING  Incomplete   Report Status PENDING  Incomplete  MRSA PCR Screening     Status: Abnormal   Collection Time: 05/13/15  3:09 AM  Result Value Ref Range  Status   MRSA by PCR POSITIVE (A) NEGATIVE Corrected    Comment: RESULT CALLED TO, READ BACK BY AND VERIFIED WITH: B.SHEPPARD @ 1610 05/13/15 M.KELLY        The GeneXpert MRSA Assay (FDA approved for NASAL specimens only), is one component of a comprehensive MRSA colonization surveillance program. It is not intended to diagnose MRSA infection nor to guide or monitor treatment for MRSA infections. CORRECTED ON 08/30 AT 9604: PREVIOUSLY REPORTED AS RESISTANT        The GeneXpert MRSA Assay (FDA approved for NASAL specimens only), is one component of a comprehensive MRSA colonization surveillance program. It is not intended to diagnose MRSA  infection nor to guide or monitor treatment for MRSA infections. RESULT CALLED TO, READ BACK BY AND VERIFIED WITH: B SHEPPARD@0433  05/13/15 MKELLY     Medical  History: Past Medical History  Diagnosis Date  . COPD (chronic obstructive pulmonary disease)   . Diastolic congestive heart failure   . C. difficile colitis   . Hypertension   . Hyperlipidemia   . CAD (coronary artery disease)   . Anemia   . Type 2 diabetes mellitus   . Pacemaker 2002  . Cardiac arrest - asystole 2002  . Gallstones   . Pulmonary hypertension   . Renal insufficiency   . Osteoarthritis   . Allergic rhinitis   . Gout   . Stasis dermatitis   . Sinoatrial node dysfunction   . Syncope   . Postsurgical percutaneous transluminal coronary angioplasty status   . Dyslipidemia   . Acute bronchitis   . History of cholelithiasis   . Obesity    Assessment: 88 yoF from [place] presented to [facility] 8/29 with AMS. Required intubation in ED. Started on Vanc and Aztreonam for r/o sepsis - unknown source. Transferred to [facility] 8/29 and now adding Levaquin for HCAP/sepsis.  Scr remains consistently 4.8 - 5.4 range. CrCl <10 ml/min. WBC elevated to 12.7.  Now initiating CRRT, will cancel VT as it is no longer needed.  Anti-infectives 8/29 Vancomycin  >> 8/29 Aztreonam >> 8/30 Levaquin>>  Cultures 8/29 bloodx2: ngtd 8/29 urine: ngtd 8/30 trach asp: ngtd  Goal of Therapy:  Vancomycin trough level 15-20 mcg/ml  Plan:  -Change vancomycin to 750 mg IV q24h -Change levofloxacin to 250 mg IV q24h -Change aztreonam to 1 g IV q8h -Watch renal plans -Deescalate as able -VR when needed    Agapito Games, PharmD, BCPS Clinical Pharmacist Pager: [phone] 05/13/2015 4:18 PM

## 2015-05-13 NOTE — Clinical Social Work Note (Signed)
Clinical Social Worker attempted to contact patient's brother via telephone and left a message for returned phone call.   CSW will continue to follow patient and family.   Derenda Fennel, MSW, LCSWA [phone] 05/13/2015 10:07 AM

## 2015-05-13 NOTE — Progress Notes (Signed)
Spoke with son and brother extensively.  Described then entire case and after discussion decision was made to make patient a LCB with no CPR or cardioversion.  If a CRRT candidate then will start CRRT until son arrives then will discuss withdrawal of medical care.  [name], M.D. [place] Pulmonary/Critical Care Medicine. Pager: [phone]. After hours pager: [phone]. 

## 2015-05-13 NOTE — Progress Notes (Signed)
Initial Nutrition Assessment  DOCUMENTATION CODES:   Not applicable  INTERVENTION:    Initiate TF via OGT with Vital AF 1.2 at 25 ml/h; increase by 10 ml every 2 hours to goal rate of 45 ml/h (1080 ml per day) to provide 1296 kcals, 81 gm protein, 876 ml free water daily.  Provide Prostat 30 ml BID via tube on day 1 to ensure adequate protein intake on day 1.  NUTRITION DIAGNOSIS:   Inadequate oral intake related to inability to eat as evidenced by NPO status.  GOAL:   Patient will meet greater than or equal to 90% of their needs  MONITOR:   TF tolerance, Vent status, Labs, Weight trends  REASON FOR ASSESSMENT:   Consult Enteral/tube feeding initiation and management  ASSESSMENT:   81 year old female from [place] presented to [facility] ED 8/29 with AMS. Recent complaints of respiratory distress and confusion. In ED was intubated for airway protection. Found to be in renal failure with pulmonary edema on CXR.  Patient is currently intubated on ventilator support MV: 9.6 L/min Temp (24hrs), Avg:98.1 F (36.7 C), Min:97.4 F (36.3 C), Max:99.4 F (37.4 C)   Wt Readings from Last 10 Encounters:  05/13/15 141 lb 5 oz (64.1 kg)  04/30/15 127 lb (57.607 kg)  12/23/14 126 lb (57.153 kg)  11/04/14 136 lb (61.689 kg)  09/02/14 140 lb (63.504 kg)  06/27/14 141 lb (63.957 kg)  06/25/14 144 lb (65.318 kg)  05/27/14 153 lb (69.4 kg)  05/15/14 148 lb 12.8 oz (67.495 kg)  05/03/14 152 lb (68.947 kg)    From review of usual weights above, it appears that patient has lost some weight over the past year; 16% weight loss within the past year. Weight on admission above most recent weight of 127 lbs on 8/17, ? due to fluids. Unable to speak with family to obtain usual nutrition and weight history. Nutrition-Focused physical exam completed. Findings are no fat depletion, mild-moderate muscle depletion, and no edema.   Labs reviewed: sodium low, BUN/creatinine elevated.  Diet Order:   Diet NPO time specified  Skin:  Reviewed, no issues  Last BM:  unknown  Height:   Ht Readings from Last 1 Encounters:  05/13/15  (1.499 m)    Weight:   Wt Readings from Last 1 Encounters:  05/13/15 141 lb 5 oz (64.1 kg)    Ideal Body Weight:  44.5 kg  BMI:  Body mass index is 28.53 kg/(m^2).  Estimated Nutritional Needs:   Kcal:  1290  Protein:  80-95 gm  Fluid:  1.5 L  EDUCATION NEEDS:   No education needs identified at this time  Joaquin Courts, RD, LDN, CNSC Pager [phone] After Hours Pager [phone]

## 2015-05-13 NOTE — Consult Note (Signed)
Reason for Consult:worsening kidney function Referring Physician: PCCM  GIANNAH ZAVADIL is an 81 y.o. female.  HPI:   81 yo female with COPD gold stage D on home o2, CAD, DM II, cardiac arrest s/p pacemaker, pulm HTN, grade 3 diastolic CHF, CKD 4, came in from [facility] SNF with worsening SOB and confusion. Per patient's brother, she had increased coughing and resp distress before being admitted here. CXR showed vascular congestion and bibasilar edema. Had hypercarbic hypoxic resp failure. She was intubated and admitted to the ICU.  She has known CKD 4, follows with Dr. Marisue Humble outpatient. They have discussed HD in the past and mutually agreed that HD will not be an option for her given her multiple co- morbidities.    At baseline, he has mild confusion but was able to carry normal conversation, is wheel chair bound. Had been living at [facility]. She used to be in the [employer].    Currently patient is intubated and denies any pain by nodding when asked.  She has been on pressors while here. PCCM discussed poor prognosis with patient's son. Dr. Marisue Humble has also discussed the case with patient's brother and patient's son. The son is out of country and will not be back until Sunday. Son did not wash patient to receive compression, but wanted everything to be done to keep patient alive other wise until he gets a chance to visit the patient in 1-2 days. This includes dialysis, al though we explained that this was against patient's wishes.   Trend in Creatinine: CREATININE, SER  Date/Time Value Ref Range Status  05/13/2015 10:01 AM 5.14* 0.44 - 1.00 mg/dL Final  91/47/8295 62:13 PM 4.87* 0.44 - 1.00 mg/dL Final  08/65/7846 96:29 PM 5.40* 0.44 - 1.00 mg/dL Final  52/84/1324 40:10 PM 5.67* 0.44 - 1.00 mg/dL Final  27/25/3664 40:34 AM 3.50* 0.40 - 1.20 mg/dL Final  74/25/9563 87:56 PM 1.8* 0.4 - 1.2 mg/dL Final  43/32/9518 84:16 AM 2.18* 0.50 - 1.10 mg/dL Final  60/63/0160 10:93 AM 2.57*  0.50 - 1.10 mg/dL Final  23/55/7322 02:54 AM 3.51* 0.50 - 1.10 mg/dL Final  27/02/2375 28:31 AM 4.06* 0.50 - 1.10 mg/dL Final  51/76/1607 37:10 AM 3.68* 0.50 - 1.10 mg/dL Final  62/69/4854 62:70 PM 3.84* 0.50 - 1.10 mg/dL Final  35/00/9381 82:99 PM 3.75* 0.50 - 1.10 mg/dL Final  37/16/9678 93:81 AM 2.3* 0.4 - 1.2 mg/dL Final  01/75/1025 85:27 AM 2.5* 0.4 - 1.2 mg/dL Final  78/24/2353 61:44 AM 2.7* 0.4 - 1.2 mg/dL Final  31/54/0086 76:19 AM 2.08* 0.50 - 1.10 mg/dL Final  50/93/2671 24:58 AM 2.47* 0.50 - 1.10 mg/dL Final  09/98/3382 50:53 PM 2.34* 0.50 - 1.10 mg/dL Final  97/67/3419 37:90 PM 2.0* 0.4 - 1.2 mg/dL Final  24/05/7352 29:92 AM 1.7* 0.4 - 1.2 mg/dL Final  42/68/3419 62:22 PM 1.8* 0.4 - 1.2 mg/dL Final  97/98/9211 94:17 AM 1.81* 0.50 - 1.10 mg/dL Final  40/81/4481 85:63 AM 2.0* 0.4 - 1.2 mg/dL Final  14/97/0263 78:58 AM 1.39* 0.4 - 1.2 mg/dL Final  85/10/7739 28:78 AM 1.61* 0.4 - 1.2 mg/dL Final  67/67/2094 70:96 AM 1.75* 0.4 - 1.2 mg/dL Final  28/36/6294 76:54 AM 2.29* 0.4 - 1.2 mg/dL Final  65/11/5463 68:12 PM 2.12* 0.4 - 1.2 mg/dL Final  75/17/0017 49:44 AM 1.7* 0.4-1.2 mg/dL Final  96/75/9163 84:66 AM 2.13* 0.4 - 1.2 mg/dL Final  59/93/5701 77:93 AM 1.95* 0.4 - 1.2 mg/dL Final  90/30/0923 30:07 AM 1.79*  0.4 - 1.2 mg/dL Final  16/06/9603 54:09 AM 1.61* 0.4 - 1.2 mg/dL Final  81/19/1478 29:56 AM 1.82* 0.4 - 1.2 mg/dL Final  21/30/8657 84:69 AM 1.61* 0.4 - 1.2 mg/dL Final  62/95/2841 32:44 AM 1.75* 0.4 - 1.2 mg/dL Final  09/15/7251 66:44 PM 1.75* 0.4 - 1.2 mg/dL Final  03/47/4259 56:38 PM 1.79* 0.4 - 1.2 mg/dL Final  75/64/3329 51:88 PM 2.0* 0.4-1.2 mg/dL Final  41/66/0630 16:01 PM 2.0* 0.4-1.2 mg/dL Final  09/32/3557 32:20 AM 2.0* 0.4-1.2 mg/dL Final  25/42/7062 37:62 AM 1.9* 0.4-1.2 mg/dL Final  83/15/1761 60:73 PM 1.6* 0.4-1.2 mg/dL Final  71/02/2693 85:46 AM 1.7* 0.4-1.2 mg/dL Final  27/11/5007 38:18 AM 1.86*  Final  02/02/2008 11:00 AM 2.09*  Final  02/01/2008  06:10 AM 1.87*  Final  01/31/2008 08:10 PM 1.68*  Final  01/28/2008 09:15 AM 1.65*  Final  08/09/2007 06:55 AM 2.16*  Final  08/08/2007 03:49 AM 2.11*  Final    PMH:   Past Medical History  Diagnosis Date  . COPD (chronic obstructive pulmonary disease)   . Diastolic congestive heart failure   . C. difficile colitis   . Hypertension   . Hyperlipidemia   . CAD (coronary artery disease)   . Anemia   . Type 2 diabetes mellitus   . Pacemaker 2002  . Cardiac arrest - asystole 2002  . Gallstones   . Pulmonary hypertension   . Renal insufficiency   . Osteoarthritis   . Allergic rhinitis   . Gout   . Stasis dermatitis   . Sinoatrial node dysfunction   . Syncope   . Postsurgical percutaneous transluminal coronary angioplasty status   . Dyslipidemia   . Acute bronchitis   . History of cholelithiasis   . Obesity     PSH:   Past Surgical History  Procedure Laterality Date  . Ptca  2002  . Appendectomy    . Vesicovaginal fistula closure w/ tah    . Pacemaker placement  2002    Allergies:  Allergies  Allergen Reactions  . Codeine     REACTION: unspecified  . Doxycycline   . Guaifenesin & Derivatives   . Penicillins     REACTION: unspecified    Medications:   Prior to Admission medications   Medication Sig Start Date End Date Taking? Authorizing Provider  acetaminophen (TYLENOL) 325 MG tablet Take 650 mg by mouth every 6 (six) hours as needed for mild pain, moderate pain, fever or headache.   Yes Historical Provider, MD  albuterol (PROVENTIL) (2.5 MG/3ML) 0.083% nebulizer solution Take 3 mLs (2.5 mg total) by nebulization every 4 (four) hours while awake. And every 4 hours as needed for wheezing 10/05/13  Yes Storm Frisk, MD  albuterol (PROVENTIL) (2.5 MG/3ML) 0.083% nebulizer solution Take 2.5 mg by nebulization every 4 (four) hours as needed for wheezing or shortness of breath.   Yes Historical Provider, MD  allopurinol (ZYLOPRIM) 100 MG tablet Take 100 mg by mouth  2 (two) times daily.     Yes Historical Provider, MD  aspirin 81 MG tablet Take 81 mg by mouth daily.     Yes Historical Provider, MD  atenolol (TENORMIN) 50 MG tablet Take 50 mg by mouth daily. Hold for SBP <110, HR < 60   Yes Historical Provider, MD  Cholecalciferol (VITAMIN D3) 2000 UNITS TABS Take 1 tablet by mouth daily.   Yes Historical Provider, MD  ferrous sulfate 325 (65 FE) MG tablet Take 325 mg by mouth daily.  Yes Historical Provider, MD  Fluticasone Furoate-Vilanterol (BREO ELLIPTA) 100-25 MCG/INH AEPB Inhale 1 puff into the lungs daily. 03/27/14  Yes Storm Frisk, MD  insulin aspart (NOVOLOG) 100 UNIT/ML injection Inject 0-20 Units into the skin 3 (three) times daily with meals. Patient taking differently: Inject 0-8 Units into the skin 2 (two) times daily. Sliding scale: 200-250=2 units 251-300=4 301-350=6 351-400=8 09/19/13  Yes William S Minor, NP  lidocaine (LIDODERM) 5 % Place 1 patch onto the skin daily. Remove & Discard patch within 12 hours or as directed by MD   Yes Historical Provider, MD  LOVAZA 1 G capsule Take 1 tablet by mouth daily. *Do Not Crush**   Yes Historical Provider, MD  omeprazole (PRILOSEC) 20 MG capsule Take 20 mg by mouth daily. * Take on an Empty stomach*   Yes Historical Provider, MD  OXYGEN Inhale into the lungs. 3 lpm   Yes Historical Provider, MD  polyethylene glycol (MIRALAX / GLYCOLAX) packet Take 17 g by mouth every 3 (three) days.   Yes Historical Provider, MD  Polyvinyl Alcohol (LIQUID TEARS OP) Place 1 drop into both eyes 4 (four) times daily.    Yes Historical Provider, MD  PROCRIT 16109 UNIT/ML injection Inject 10,000 Units into the skin once a week. Every Friday.   Yes Historical Provider, MD  rosuvastatin (CRESTOR) 20 MG tablet Take 20 mg by mouth daily.     Yes Historical Provider, MD  senna (SENOKOT) 8.6 MG tablet Take 1 tablet by mouth daily.   Yes Historical Provider, MD  tiotropium (SPIRIVA) 18 MCG inhalation capsule Place 18 mcg  into inhaler and inhale daily.     Yes Historical Provider, MD  torsemide (DEMADEX) 20 MG tablet Take 2 tablets (40 mg total) by mouth daily. 10/05/13  Yes Storm Frisk, MD    Inpatient medications: . antiseptic oral rinse  7 mL Mouth Rinse QID  . aztreonam  500 mg Intravenous Q8H  . chlorhexidine gluconate  15 mL Mouth Rinse BID  . Chlorhexidine Gluconate Cloth  6 each Topical Q0600  . feeding supplement (VITAL HIGH PROTEIN)  1,000 mL Per Tube Q24H  . fentaNYL      . heparin  5,000 Units Subcutaneous 3 times per day  . ipratropium-albuterol  3 mL Nebulization Q6H  . [START ON 05/15/2015] levofloxacin (LEVAQUIN) IV  500 mg Intravenous Q48H  . mupirocin ointment  1 application Nasal BID  . pantoprazole (PROTONIX) IV  40 mg Intravenous QHS  . sodium chloride  3 mL Intravenous Q12H  . [START ON 05/14/2015] vancomycin  500 mg Intravenous Q48H    Discontinued Meds:   Medications Discontinued During This Encounter  Medication Reason  . hydrocortisone sodium succinate (SOLU-CORTEF) 100 MG injection 50 mg     Social History:  reports that she quit smoking about 29 years ago. Her smoking use included Cigarettes. She has a 50 pack-year smoking history. She has never used smokeless tobacco. She reports that she does not drink alcohol or use illicit drugs.  Family History:   Family History  Problem Relation Age of Onset  . Diabetes Mother   . Hypertension Mother   . Heart disease Father    Review of Systems  Unable to perform ROS: intubated  :  Weight change:   Intake/Output Summary (Last 24 hours) at 05/13/15 1156 Last data filed at 05/13/15 1100  Gross per 24 hour  Intake 1862.5 ml  Output     10 ml  Net 1852.5 ml  BP 83/42 mmHg  Pulse 50  Temp(Src) 97.4 F (36.3 C) (Oral)  Resp 23  Ht 4\' 11"  (1.499 m)  Wt 141 lb 5 oz (64.1 kg)  BMI 28.53 kg/m2  SpO2 99% Filed Vitals:   05/13/15 1100 05/13/15 1115 05/13/15 1126 05/13/15 1130  BP: 102/61 83/52  83/42  Pulse: 67 68  50   Temp:   97.4 F (36.3 C)   TempSrc:   Oral   Resp: 26 26  23   Height:      Weight:      SpO2: 100% 99%  99%     Physical Exam  Constitutional:  Intubated, intermittently agitated.   HENT:  Head: Normocephalic and atraumatic.  Intubated.   Eyes: EOM are normal. Pupils are equal, round, and reactive to light.  Cardiovascular: Normal rate and regular rhythm.  Exam reveals no gallop and no friction rub.   No murmur heard. Respiratory:  Intubated. Limited exam, low air movement anteriorly, course breathe sounds.   GI: Soft. Bowel sounds are normal. She exhibits no distension. There is no tenderness.  Musculoskeletal: She exhibits no edema.  Neurological:  Intubated, occasionally agitated and fighting the vent.     Labs: Basic Metabolic Panel:  Recent Labs Lab May 29, 2015 1933 29-May-2015 1945 05/29/2015 2338 05/13/15 1001  NA 133* 130* 135 134*  K 4.0 4.0 3.7 3.9  CL 93* 96* 102 98*  CO2 21*  --  18* 19*  GLUCOSE 69 65 101* 150*  BUN 124* 117* 114* 116*  CREATININE 5.67* 5.40* 4.87* 5.14*  ALBUMIN 2.9*  --  2.2*  --   CALCIUM 8.5*  --  7.4* 7.2*  PHOS  --   --  6.3*  --    Liver Function Tests:  Recent Labs Lab May 29, 2015 1933 2015/05/29 2338  AST 58* 66*  ALT 18 17  ALKPHOS 142* 111  BILITOT 1.0 0.6  PROT 6.7 5.8*  ALBUMIN 2.9* 2.2*   No results for input(s): LIPASE, AMYLASE in the last 168 hours. No results for input(s): AMMONIA in the last 168 hours. CBC:  Recent Labs Lab May 29, 2015 1933 05-29-15 1945  WBC 12.7*  --   NEUTROABS 11.8*  --   HGB 10.2* 11.9*  HCT 32.0* 35.0*  MCV 82.3  --   PLT 176  --    PT/INR: @LABRCNTIP (inr:5) Cardiac Enzymes: ) Recent Labs Lab 05-29-15 2338  TROPONINI 0.07*   CBG:  Recent Labs Lab 05/29/15 1917 05/13/15 0310 05/13/15 0726 05/13/15 1124  GLUCAP 67 123* 165* 149*    Iron Studies: No results for input(s): IRON, TIBC, TRANSFERRIN, FERRITIN in the last 168 hours.  Xrays/Other Studies: Dg Chest 1  View  05/13/2015   CLINICAL DATA:  Central line placement  EXAM: CHEST  1 VIEW  COMPARISON:  05/29/2015  FINDINGS: The endotracheal tube is 4.6 cm above the carina. There is a new right jugular central line extending into the SVC. There are intact appearances of the transvenous leads. There is no pneumothorax. Mild left base consolidation and small effusion persist without significant interval change. Diffuse bilateral airspace opacities also persist unchanged.  IMPRESSION: New right jugular central line. No pneumothorax. No other significant interval change.   Electronically Signed   By: Ellery Plunk M.D.   On: 05/13/2015 01:25   Dg Chest 1 View  05-29-15   CLINICAL DATA:  Nursing home patient with acute onset of mental status changes and nonproductive cough earlier today around 1300 hr. Former smoker. Current history of COPD,  diastolic CHF, hypertension, diabetes and coronary artery disease.  EXAM: CHEST  1 VIEW  COMPARISON:  09/02/2014 and earlier.  FINDINGS: Cardiac silhouette markedly enlarged. Mild-to-moderate diffuse interstitial and airspace pulmonary edema, asymmetric and increased in the right lung. Associated small bilateral pleural effusions. Stable linear scarring at the left lung base. Left subclavian dual lead transvenous pacemaker unchanged and appears intact.  IMPRESSION: Acute CHF, with stable marked cardiomegaly and mild to moderate diffuse interstitial and airspace pulmonary edema, asymmetric and increased on the right. Associated small bilateral pleural effusions.   Electronically Signed   By: Hulan Saas M.D.   On: 05/09/2015 19:57   Ct Head Wo Contrast  04/17/2015   CLINICAL DATA:  Decreased level of consciousness today.  EXAM: CT HEAD WITHOUT CONTRAST  TECHNIQUE: Contiguous axial images were obtained from the base of the skull through the vertex without intravenous contrast.  COMPARISON:  09/14/2013  FINDINGS: Stable age related cerebral atrophy, ventriculomegaly and  periventricular white matter disease. No extra-axial fluid collections are identified. No CT findings for acute hemispheric infarction or intracranial hemorrhage. No mass lesions. Probable remote white matter infarct in the right frontal area. The brainstem and cerebellum are normal.  No acute bony findings. The paranasal sinuses and mastoid air cells are clear.  IMPRESSION: Stable age related cerebral atrophy, ventriculomegaly and periventricular white matter disease. No acute intracranial findings or mass lesion.   Electronically Signed   By: Rudie Meyer M.D.   On: 05/01/2015 20:18   US Renal  05/13/2015   CLINICAL DATA:  Acute kidney injury  EXAM: RENAL / URINARY TRACT ULTRASOUND COMPLETE  COMPARISON:  None.  FINDINGS: Right Kidney:  Length: 9.8 cm. Increased parenchymal echogenicity consistent with medical renal disease. Benign 3.6 cm upper pole cyst. No hydronephrosis.  Left Kidney:  Length: 9.2 cm. Increased parenchymal echogenicity consistent with medical renal disease. No hydronephrosis. Trace perinephric fluid.  Bladder:  Collapsed around a Foley catheter.  IMPRESSION: Negative for hydronephrosis. Increased parenchymal echogenicity suggesting medical renal disease.   Electronically Signed   By: Ellery Plunk M.D.   On: 05/13/2015 01:42   Dg Chest Port 1 View  05/13/2015   CLINICAL DATA:  Respiratory failure. Assess endotracheal tube placement. Subsequent encounter.  EXAM: PORTABLE CHEST - 1 VIEW  COMPARISON:  Chest radiograph performed earlier today at 1:09 a.m.  FINDINGS: The patient's endotracheal tube is seen ending 4 cm above the carina. An enteric tube is noted extending below the diaphragm. A right IJ line is noted ending about the mid SVC.  Small bilateral pleural effusions are noted, left greater than right, with increased interstitial markings, likely reflecting mild pulmonary edema. Underlying vascular congestion is seen. No pneumothorax is identified.  The cardiomediastinal silhouette  is enlarged. A pacemaker is noted overlying the left chest wall, with leads ending overlying the right atrium and right ventricle. No acute osseous abnormalities are identified.  IMPRESSION: 1. Endotracheal tube seen ending 4 cm above the carina. 2. Small bilateral pleural effusions, left greater than right, with increased interstitial markings, likely reflecting mild pulmonary edema. Vascular congestion and cardiomegaly noted.   Electronically Signed   By: Roanna Raider M.D.   On: 05/13/2015 03:25   Dg Chest Port 1 View  05/04/2015   CLINICAL DATA:  Endotracheal tube placement.  Initial encounter.  EXAM: PORTABLE CHEST - 1 VIEW  COMPARISON:  Chest radiograph performed earlier today at 7:47 p.m.  FINDINGS: The patient's endotracheal tube is seen ending 2-3 cm above the carina.  A  small left pleural effusion is noted. Diffuse bilateral airspace opacification is mildly worsened from the prior study and may reflect multifocal pneumonia or possibly pulmonary edema. No pneumothorax is seen.  The cardiomediastinal silhouette is mildly enlarged. No acute osseous abnormalities are identified. A pacemaker is noted overlying the left chest wall, with leads ending overlying the right atrium and right ventricle.  IMPRESSION: 1. Endotracheal tube seen ending 2-3 cm above the carina. 2. Small left pleural effusion noted. Diffuse bilateral airspace opacification is mildly worsened from the prior study and may reflect multifocal pneumonia or possibly pulmonary edema. Mild cardiomegaly noted.   Electronically Signed   By: Roanna Raider M.D.   On: Jun 09, 2015 21:42   Dg Abd Portable 1v  05/13/2015   CLINICAL DATA:  Obstructed nasogastric tube  EXAM: PORTABLE ABDOMEN - 1 VIEW  COMPARISON:  September 15, 2013  FINDINGS: Nasogastric tube tip and side port are in the region of the distal stomach. Bowel gas pattern is unremarkable. No obstruction or free air apparent. There are multiple foci of arterial vascular calcification. There  is lumbar levoscoliosis, stable.  IMPRESSION: Bowel gas pattern unremarkable. Nasogastric tube tip and side port in distal stomach.   Electronically Signed   By: Bretta Bang III M.D.   On: 05/13/2015 09:10   Assessment/Plan:  81 yo with multiple medical comorbidites including diastolic CHF, oxygen depended GOLD D COPD on 3L o2, CKD 5, here with resp failure and worsening kidney function.  1. Oliguric AKI vs progression of CKD 4 to ESRD - likely 2/2 to ATN in the setting of sepsis. FENA 1.16%. Dr. Marisue Humble and patient has discussed dialysis in the past and has agreed not to be on dialysis given her multiple co-morbidities including dCHF and COPD. However, patients family wants CRRT as a temporary measure until the son can come back on Sunday to see the patient.   2. VDRF - 2/2 to COPD and CHF - CXR shows vasc congestion and bibasilar edema - CVP 18. remains intubated. Hard to remove volume with pressor depended hypotension.  3. Sepsis - likely 2/2 to ?PNA - elevated PCT. On aztreonam. On pressors. 4. Diastolic CHF - on demadex outpatient. Not on diuretics now. 5. COPD GOLD D - oxygen depended at baseline. On nebs per PCCM.   Plan: Patient has expressed her desire against any form of dialysis in the clinic with Dr. Marisue Humble, however per family's request we will start trial of CRRT. Family is aware that this is only a temporary measure until patient's son comes back on Sunday. If CRRT does not improve patients' status or causes further complication, we will stop CRRT.   Lundy Cozart 05/13/2015, 11:56 AM

## 2015-05-13 NOTE — Progress Notes (Signed)
Rx Brief Antibiotic note:  Azactam  See 8/29 note from C Summe for full details  Plan:  Azactam  IV q8h (CrCl~6 ml/min)  F/u SCr/cultures  Susanne Greenhouse R 05/13/2015 12:26 AM

## 2015-05-13 NOTE — Procedures (Signed)
Hemodialysis Insertion Procedure Note JAVERIA BRISKI [id number] 1928/01/22  Procedure: Insertion of Hemodialysis Catheter Type: 3 port  Indications: Hemodialysis   Procedure Details Consent: Risks of procedure as well as the alternatives and risks of each were explained to the (patient/caregiver).  Consent for procedure obtained. Time Out: Verified patient identification, verified procedure, site/side was marked, verified correct patient position, special equipment/implants available, medications/allergies/relevent history reviewed, required imaging and test results available.  Performed  Maximum sterile technique was used including antiseptics, cap, gloves, gown, hand hygiene, mask and sheet. Skin prep: Chlorhexidine; local anesthetic administered A antimicrobial bonded/coated triple lumen catheter was placed in the right internal jugular vein using the Seldinger technique. Ultrasound guidance used.Yes.   Catheter placed to 15 cm. Blood aspirated via all 3 ports and then flushed x 3. Line sutured x 2 and dressing applied.  Evaluation Blood flow good Complications: No apparent complications Patient did tolerate procedure well. Chest X-ray ordered to verify placement.  CXR: pending.  Joneen Roach, AGACNP-BC [facility] Pulmonology/Critical Care Pager [phone] or [phone]  05/13/2015 1:08 AM       Required  Mcarthur Rossetti. Tyson Alias, MD, FACP Pgr: [phone] [place] [facility]

## 2015-05-13 NOTE — Progress Notes (Signed)
eLink Physician-Brief Progress Note Patient Name: [name] DOB: October 20, 1927 MRN: [id number]   Date of Service  05/13/2015  HPI/Events of Note  Blood sugar 189  eICU Interventions  SSI ordered     Intervention Category Intermediate Interventions: Hyperglycemia - evaluation and treatment  Elishah Ashmore 05/13/2015, 11:34 PM

## 2015-05-13 NOTE — Clinical Social Work Note (Signed)
Clinical Social Work Assessment  Patient Details  Name: [name] MRN: [id number] Date of Birth: 1928-05-12  Date of referral:  05/13/15               Reason for consult:   (Admitted from facility )                Permission sought to share information with:  Case Manager, Facility Medical sales representative, Family Supports Permission granted to share information::  Yes, Verbal Permission Granted  Name::      Vaughan Browner )  Agency::   [facility])  Relationship::   (Brother)  Contact Information:   [phone])  Housing/Transportation Living arrangements for the past 2 months:  Skilled Nursing Facility Source of Information:  Facility (Sibling ) Patient Interpreter Needed:  None Criminal Activity/Legal Involvement Pertinent to Current Situation/Hospitalization:  No - Comment as needed Significant Relationships:  Siblings, Adult Children Lives with:  Facility Resident Do you feel safe going back to the place where you live?  Yes Need for family participation in patient care:  No (Coment)  Care giving concerns:  Patient admitted from SNF.    Social Worker assessment / plan:  Visual merchandiser spoke with patient's brother at length in reference to post-acute placement/pt being admitted from SNF. CSW introduced CSW role. Patient's brother stated patient is from SNF, [facility] where she has been a long-term resident for the past 10-12 years. Patient reported EMS transported patient from [facility] to [facility] for medical attention however patient was transferred to [facility] for continued medical intervention. Patient was intubated at [facility]. Pt's brother indicated that patient has been doing poorly for a while and that renal failure as worsened. Pt's brother reported his understanding is if pt survives this hospitalization she will need dialysis. Pt's brother agreeable to patient returning to [facility] if appropriate. Pt's brother stated he will be arriving to [facility] for  visitation soon. No further concerns reported by family at this time. CSW will continue to follow pt and pt's family for continued support and to facilitate pt's discharge need once medically stable.   Employment status:  Retired Health and safety inspector:    PT Recommendations:  Not assessed at this time Information / Referral to community resources:  Skilled Nursing Facility  Patient/Family's Response to care:  Pt currently intubated. Patient's brother agreeable to pt's return to SNF, [facility] if appropriate. Pt's brother very involved in pt's care and supportive of interventions. Pt's brother pleasant and appreciated social work intervention.   Patient/Family's Understanding of and Emotional Response to Diagnosis, Current Treatment, and Prognosis: Patient's brother expressed a clear understanding of pt's diagnosis and continued medical work up.     Emotional Assessment Appearance:  Appears stated age Attitude/Demeanor/Rapport:  Intubated (Following Commands or Not Following Commands) Affect (typically observed):  Unable to Assess Orientation:  Oriented to Self Alcohol / Substance use:  Not Applicable Psych involvement (Current and /or in the community):  No (Comment)  Discharge Needs  Concerns to be addressed:  Care Coordination Readmission within the last 30 days:  No Current discharge risk:  None Barriers to Discharge:  Continued Medical Work up   [employer], MSW, LCSWA [phone] 05/13/2015 11:11 AM

## 2015-05-13 NOTE — Consult Note (Signed)
Consultation Note Date: 05/13/2015   Patient Name: [name]  DOB: 1927-11-05  MRN: [id number]  Age / Sex: 81 y.o., female   PCP: Renford Dills, MD Referring Physician: Nelda Bucks, MD  Reason for Consultation: Establishing goals of care and Psychosocial/spiritual support  Palliative Care Assessment and Plan Summary of Established Goals of Care and Medical Treatment Preferences   Clinical Assessment/Narrative: Mrs. [name] is unable to participate in today's discussion. I find her family in the waiting room.  Present today are her brother, [name], her grandson [name], and her ex daughter in Social worker.  Mrs. [name]' son, [name], is on a cruise in [place] and unable to make it back to [place] until Sunday.    We talk about advance directives and Jeanella Craze [name] Mrs. [name] refused to talk about these issues as they made her uncomfortable.  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  We talk about how serious her illness is at this time and that we are doing everything we can for her.  Jeanella Craze [name] he has been in contact with Asher Muir and they would like everything done to keep her alive until Asher Muir can get here.  Jeanella Craze [name] his main concern is that he is "not prepared for this day", but that he would "want her to be comfortable", but that he also wants peace with his nephew after Mrs. [name] passing.  Jeanella Craze [name] he is the last sibling and has had to be responsible for other family members at end of life in the past.  Jeanella Craze seems to understand and accept that Mrs. [name] is likely at end of life.  We talk about CRRT and Jeanella Craze [name] he has been told that Mrs. [name] will need out patient HD 3-4 days per week and she would not want that.  I share my worry that Mrs. [name] will not be strong enough to make it until Sunday and Jeanella Craze agrees.  We discuss compassionate extubation if they wish.  Leotis Shames, listed in contacts, is pastor and  family friend.   Contacts/Participants in Discussion: Primary Decision Maker: Mrs. [name] is unable to make her own decisions at this time.  Her brother, [name] that he and Mrs. [name] son,  [name], share decision making.   HCPOA:  [name] this may be on file at [facility].   Code Status/Advance Care Planning:  Partial Code:  No chest compressions, No cardioversion, continue intubation/ventilation, CRRT for short term, and medications to control blood pressure and heart rate.   Symptom Management:   Fentanyl 50 mcg IV Q 2 hours PRN.   Palliative Prophylaxis: None at this time.   Psycho-social/Spiritual:   Support System: Lives in [facility] for close to 10 years, but now has more skilled needs.  Brother, [name], son [name].   Desire for further Chaplaincy support: Not discussed today.   Prognosis: Poor prognosis d/t advanced age, frailty and severity of illness. Family may wish to transition to comfort care  Discharge Planning:  Disposition unknown at this time.        Chief Complaint:  AMS  History of Present Illness:  81 year old female with multiple medical problems for [facility]. Her PMH is outlined below, and is significant for COPD (GOLD D, on home O2 3L, followed by PW), CAD with h/o arrest, Diastolic CHF (followed by Dr. Antoine Poche), she has pacemaker and recent interrogations show PAF (not  currently on anticoagulation), DM 2, and PAH. She also has stage 4 CKD, and has reportedly not wanted dialysis in the past. At baseline she is somewhat independent. Recently she has been confined to wheelchair, but brother reports that she is able to dress herself and walk short distances. She considers this favorable according to brother. Her son is the Management consultant, but is out of town on vacation this week. He and the brother have been communicating and making decisions together.  Her brother visited her at [facility] 8/25 or 8/26 (he  can't remember) and noted her to be in some acute respiratory distress. He described an audible wheeze. She had cough, but it was non-productive and no worse than her chronic baseline cough. He is not sure if anything was done to address this. It appears as though she was recently started on cipro, although the reasoning for that is unclear. Since that time she has had a steady decline in mental status, which reportedly accelerated today 8/29 at about 1pm. She was evaluated by the facility's physician who decided to transfer her to ED. In ED she was profoundly altered and somewhat combative. She moved all 4 extremities equally, but was non-purposeful. and intubated for airway protection. CXR was ordered, and was consistent with pulmonary edema. Lab work was significant for kidney injury (SCr 5.4 up from 3.5 3/16) and mild leukocytosis (12). No significant electrolyte abnormalities. She became hypotensive and was started on levophed. PCCM contacted for admission  Primary Diagnoses  Present on Admission:  . Acute kidney injury  Palliative Review of Systems: [name] is unable to participate in ROS dt intubation.    I have reviewed the medical record, interviewed the patient and family, and examined the patient. The following aspects are pertinent.  Past Medical History  Diagnosis Date  . COPD (chronic obstructive pulmonary disease)   . Diastolic congestive heart failure   . C. difficile colitis   . Hypertension   . Hyperlipidemia   . CAD (coronary artery disease)   . Anemia   . Type 2 diabetes mellitus   . Pacemaker 2002  . Cardiac arrest - asystole 2002  . Gallstones   . Pulmonary hypertension   . Renal insufficiency   . Osteoarthritis   . Allergic rhinitis   . Gout   . Stasis dermatitis   . Sinoatrial node dysfunction   . Syncope   . Postsurgical percutaneous transluminal coronary angioplasty status   . Dyslipidemia   . Acute bronchitis   . History of cholelithiasis   .  Obesity    Social History   Social History  . Marital Status: Widowed    Spouse Name: N/A  . Number of Children: 1  . Years of Education: N/A   Occupational History  . Retired     Hospital doctor  .     Social History Main Topics  . Smoking status: Former Smoker -- 2.00 packs/day for 25 years    Types: Cigarettes    Quit date: 09/13/1985  . Smokeless tobacco: Never Used  . Alcohol Use: No  . Drug Use: No  . Sexual Activity: Not Asked   Other Topics Concern  . None   Social History Narrative   Resides at [facility]   Family History  Problem Relation Age of Onset  . Diabetes Mother   . Hypertension Mother   . Heart disease Father    Scheduled Meds: . antiseptic oral rinse  7 mL Mouth Rinse QID  . aztreonam  500 mg Intravenous Q8H  . chlorhexidine gluconate  15 mL Mouth Rinse BID  . Chlorhexidine Gluconate Cloth  6 each Topical Q0600  . heparin  5,000 Units Subcutaneous 3 times per day  . ipratropium-albuterol  3 mL Nebulization Q6H  . [START ON 05/15/2015] levofloxacin (LEVAQUIN) IV  500 mg Intravenous Q48H  . mupirocin ointment  1 application Nasal BID  . pantoprazole (PROTONIX) IV  40 mg Intravenous QHS  . sodium chloride  3 mL Intravenous Q12H  . [START ON 05/14/2015] vancomycin  500 mg Intravenous Q48H   Continuous Infusions: . feeding supplement (VITAL AF 1.2 CAL) 45 mL (05/13/15 1300)  . norepinephrine (LEVOPHED) Adult infusion 15 mcg/min (05/13/15 1400)  . vasopressin (PITRESSIN) infusion - *FOR SHOCK* 0.03 Units/min (05/13/15 1400)   PRN Meds:.sodium chloride, sodium chloride, albuterol, fentaNYL (SUBLIMAZE) injection, fentaNYL (SUBLIMAZE) injection, heparin, sodium chloride Medications Prior to Admission:  Prior to Admission medications   Medication Sig Start Date End Date Taking? Authorizing Provider  acetaminophen (TYLENOL) 325 MG tablet Take 650 mg by mouth every 6 (six) hours as needed for mild pain, moderate pain, fever or headache.   Yes  Historical Provider, MD  albuterol (PROVENTIL) (2.5 MG/3ML) 0.083% nebulizer solution Take 3 mLs (2.5 mg total) by nebulization every 4 (four) hours while awake. And every 4 hours as needed for wheezing 10/05/13  Yes Storm Frisk, MD  albuterol (PROVENTIL) (2.5 MG/3ML) 0.083% nebulizer solution Take 2.5 mg by nebulization every 4 (four) hours as needed for wheezing or shortness of breath.   Yes Historical Provider, MD  allopurinol (ZYLOPRIM) 100 MG tablet Take 100 mg by mouth 2 (two) times daily.     Yes Historical Provider, MD  aspirin 81 MG tablet Take 81 mg by mouth daily.     Yes Historical Provider, MD  atenolol (TENORMIN) 50 MG tablet Take 50 mg by mouth daily. Hold for SBP <110, HR < 60   Yes Historical Provider, MD  Cholecalciferol (VITAMIN D3) 2000 UNITS TABS Take 1 tablet by mouth daily.   Yes Historical Provider, MD  ferrous sulfate 325 (65 FE) MG tablet Take 325 mg by mouth daily.    Yes Historical Provider, MD  Fluticasone Furoate-Vilanterol (BREO ELLIPTA) 100-25 MCG/INH AEPB Inhale 1 puff into the lungs daily. 03/27/14  Yes Storm Frisk, MD  insulin aspart (NOVOLOG) 100 UNIT/ML injection Inject 0-20 Units into the skin 3 (three) times daily with meals. Patient taking differently: Inject 0-8 Units into the skin 2 (two) times daily. Sliding scale: 200-250=2 units 251-300=4 301-350=6 351-400=8 09/19/13  Yes William S Minor, NP  lidocaine (LIDODERM) 5 % Place 1 patch onto the skin daily. Remove & Discard patch within 12 hours or as directed by MD   Yes Historical Provider, MD  LOVAZA 1 G capsule Take 1 tablet by mouth daily. *Do Not Crush**   Yes Historical Provider, MD  omeprazole (PRILOSEC) 20 MG capsule Take 20 mg by mouth daily. * Take on an Empty stomach*   Yes Historical Provider, MD  OXYGEN Inhale into the lungs. 3 lpm   Yes Historical Provider, MD  polyethylene glycol (MIRALAX / GLYCOLAX) packet Take 17 g by mouth every 3 (three) days.   Yes Historical Provider, MD    Polyvinyl Alcohol (LIQUID TEARS OP) Place 1 drop into both eyes 4 (four) times daily.    Yes Historical Provider, MD  PROCRIT 08657 UNIT/ML injection Inject 10,000 Units into the skin once a week. Every Friday.   Yes Historical Provider,  MD  rosuvastatin (CRESTOR) 20 MG tablet Take 20 mg by mouth daily.     Yes Historical Provider, MD  senna (SENOKOT) 8.6 MG tablet Take 1 tablet by mouth daily.   Yes Historical Provider, MD  tiotropium (SPIRIVA) 18 MCG inhalation capsule Place 18 mcg into inhaler and inhale daily.     Yes Historical Provider, MD  torsemide (DEMADEX) 20 MG tablet Take 2 tablets (40 mg total) by mouth daily. 10/05/13  Yes Storm Frisk, MD   Allergies  Allergen Reactions  . Codeine     REACTION: unspecified  . Doxycycline   . Guaifenesin & Derivatives   . Penicillins     REACTION: unspecified   CBC:    Component Value Date/Time   WBC 12.7* 04/23/2015 1933   HGB 11.9* 05/08/2015 1945   HCT 35.0* 05/14/2015 1945   PLT 176 04/20/2015 1933   MCV 82.3 05/02/2015 1933   NEUTROABS 11.8* 04/26/2015 1933   LYMPHSABS 0.4* 04/18/2015 1933   MONOABS 0.4 04/19/2015 1933   EOSABS 0.0 05/02/2015 1933   BASOSABS 0.0 04/16/2015 1933   Comprehensive Metabolic Panel:    Component Value Date/Time   NA 135 05/13/2015 1347   K 4.4 05/13/2015 1347   CL 99* 05/13/2015 1347   CO2 20* 05/13/2015 1347   BUN 119* 05/13/2015 1347   CREATININE 5.23* 05/13/2015 1347   GLUCOSE 119* 05/13/2015 1347   CALCIUM 7.3* 05/13/2015 1347   CALCIUM 9.5 05/21/2011 0956   AST 66* 05/06/2015 2338   ALT 17 04/16/2015 2338   ALKPHOS 111 04/14/2015 2338   BILITOT 0.6 05/04/2015 2338   PROT 5.8* 04/22/2015 2338   ALBUMIN 2.2* 04/24/2015 2338    Physical Exam: Vital Signs: BP 104/52 mmHg  Pulse 66  Temp(Src) 97.4 F (36.3 C) (Oral)  Resp 26  Ht 4\' 11"  (1.499 m)  Wt 64.1 kg (141 lb 5 oz)  BMI 28.53 kg/m2  SpO2 100% SpO2: SpO2: 100 % O2 Device: O2 Device: Ventilator O2 Flow Rate: O2 Flow  Rate (L/min): 2 L/min Intake/output summary:  Intake/Output Summary (Last 24 hours) at 05/13/15 1454 Last data filed at 05/13/15 1400  Gross per 24 hour  Intake 2102.7 ml  Output     10 ml  Net 2092.7 ml   LBM:   Baseline Weight: Weight: 64.1 kg (141 lb 5 oz) Most recent weight: Weight: 64.1 kg (141 lb 5 oz)  Exam Findings:  Constitutional:  Elderly frail, lying in bed, ventilated.  Resp: ventilated.  Cardio:  Rate controlled in 90's          Palliative Performance Scale: 10%             Additional Data Reviewed: Recent Labs     05/05/2015  1933  05/04/2015  1945   05/13/15  1001  05/13/15  1347  WBC  12.7*   --    --    --    --   HGB  10.2*  11.9*   --    --    --   PLT  176   --    --    --    --   NA  133*  130*   < >  134*  135  BUN  124*  117*   < >  116*  119*  CREATININE  5.67*  5.40*   < >  5.14*  5.23*   < > = values in this interval not displayed.  Time In: 1320 Time Out: 1420  Time Total:  60 minutes Greater than 50%  of this time was spent counseling and coordinating care related to the above assessment and plan.  Signed by: Katheran Awe, NP  Katheran Awe, NP  05/13/2015, 2:54 PM  Please contact Palliative Medicine Team phone at [phone] for questions and concerns.

## 2015-05-14 ENCOUNTER — Inpatient Hospital Stay (HOSPITAL_COMMUNITY): Payer: Medicare Other

## 2015-05-14 LAB — LEGIONELLA ANTIGEN, URINE

## 2015-05-14 LAB — CBC
HEMATOCRIT: 31.1 % — AB (ref 36.0–46.0)
HEMOGLOBIN: 10 g/dL — AB (ref 12.0–15.0)
MCH: 26 pg (ref 26.0–34.0)
MCHC: 32.2 g/dL (ref 30.0–36.0)
MCV: 81 fL (ref 78.0–100.0)
PLATELETS: 190 10*3/uL (ref 150–400)
RBC: 3.84 MIL/uL — ABNORMAL LOW (ref 3.87–5.11)
RDW: 16.3 % — ABNORMAL HIGH (ref 11.5–15.5)
WBC: 28.5 10*3/uL — AB (ref 4.0–10.5)

## 2015-05-14 LAB — RENAL FUNCTION PANEL
ALBUMIN: 1.9 g/dL — AB (ref 3.5–5.0)
ANION GAP: 8 (ref 5–15)
BUN: 34 mg/dL — ABNORMAL HIGH (ref 6–20)
CALCIUM: 7.9 mg/dL — AB (ref 8.9–10.3)
CO2: 26 mmol/L (ref 22–32)
CREATININE: 1.79 mg/dL — AB (ref 0.44–1.00)
Chloride: 102 mmol/L (ref 101–111)
GFR, EST AFRICAN AMERICAN: 29 mL/min — AB (ref >60)
GFR, EST NON AFRICAN AMERICAN: 25 mL/min — AB (ref >60)
Glucose, Bld: 94 mg/dL (ref 65–99)
PHOSPHORUS: 2.4 mg/dL — AB (ref 2.5–4.6)
Potassium: 4.5 mmol/L (ref 3.5–5.1)
SODIUM: 136 mmol/L (ref 135–145)

## 2015-05-14 LAB — BLOOD GAS, ARTERIAL
Acid-base deficit: 4.2 mmol/L — ABNORMAL HIGH (ref 0.0–2.0)
BICARBONATE: 20.1 meq/L (ref 20.0–24.0)
Drawn by: 42624
FIO2: 0.4
O2 SAT: 93.8 %
PEEP: 5 cmH2O
PH ART: 7.377 (ref 7.350–7.450)
Patient temperature: 98.6
RATE: 26 resp/min
TCO2: 21.2 mmol/L (ref 0–100)
VT: 350 mL
pCO2 arterial: 35 mmHg (ref 35.0–45.0)
pO2, Arterial: 71 mmHg — ABNORMAL LOW (ref 80.0–100.0)

## 2015-05-14 LAB — BASIC METABOLIC PANEL
ANION GAP: 12 (ref 5–15)
BUN: 65 mg/dL — ABNORMAL HIGH (ref 6–20)
CHLORIDE: 98 mmol/L — AB (ref 101–111)
CO2: 23 mmol/L (ref 22–32)
Calcium: 7.5 mg/dL — ABNORMAL LOW (ref 8.9–10.3)
Creatinine, Ser: 3 mg/dL — ABNORMAL HIGH (ref 0.44–1.00)
GFR calc Af Amer: 15 mL/min — ABNORMAL LOW (ref >60)
GFR, EST NON AFRICAN AMERICAN: 13 mL/min — AB (ref >60)
GLUCOSE: 186 mg/dL — AB (ref 65–99)
POTASSIUM: 4 mmol/L (ref 3.5–5.1)
Sodium: 133 mmol/L — ABNORMAL LOW (ref 135–145)

## 2015-05-14 LAB — MAGNESIUM: Magnesium: 2 mg/dL (ref 1.7–2.4)

## 2015-05-14 LAB — GLUCOSE, CAPILLARY
GLUCOSE-CAPILLARY: 183 mg/dL — AB (ref 65–99)
GLUCOSE-CAPILLARY: 114 mg/dL — AB (ref 65–99)
GLUCOSE-CAPILLARY: 92 mg/dL (ref 65–99)
Glucose-Capillary: 138 mg/dL — ABNORMAL HIGH (ref 65–99)
Glucose-Capillary: 158 mg/dL — ABNORMAL HIGH (ref 65–99)
Glucose-Capillary: 122 mg/dL — ABNORMAL HIGH (ref 65–99)

## 2015-05-14 LAB — PHOSPHORUS: Phosphorus: 2.7 mg/dL (ref 2.5–4.6)

## 2015-05-14 LAB — PROCALCITONIN: Procalcitonin: 13.77 ng/mL

## 2015-05-14 LAB — ALBUMIN: Albumin: 1.9 g/dL — ABNORMAL LOW (ref 3.5–5.0)

## 2015-05-14 LAB — VANCOMYCIN, RANDOM: Vancomycin Rm: 6 ug/mL

## 2015-05-14 MED ORDER — FENTANYL CITRATE (PF) 100 MCG/2ML IJ SOLN
50.0000 ug | INTRAMUSCULAR | Status: DC | PRN
  Administered 2015-05-14 – 2015-05-18 (×11): 100 ug via INTRAVENOUS
  Filled 2015-05-14 (×12): qty 2

## 2015-05-14 MED ORDER — DEXTROSE 5 % IV SOLN
0.0000 ug/min | INTRAVENOUS | Status: DC
  Administered 2015-05-14: 10 ug/min via INTRAVENOUS
  Administered 2015-05-18: 4 ug/min via INTRAVENOUS
  Filled 2015-05-14 (×3): qty 16

## 2015-05-14 NOTE — Progress Notes (Signed)
PULMONARY / CRITICAL CARE MEDICINE   Name: [name] MRN: [id number] DOB: 08-31-1928    ADMISSION DATE:  04/20/2015 CONSULTATION DATE:  05/13/2015  REFERRING MD :  EDP  CHIEF COMPLAINT:  AMS  INITIAL PRESENTATION: 81 year old female from [place] presented to [facility] ED 8/29 with AMS. Recent complaints of respiratory distress and confusion. In ED was intubated for airway protection. Found to be in renal failure with pulmonary edema on CXR. PCCM to admit.   STUDIES:  8/29 CT head > no acute findings  SIGNIFICANT EVENTS: 8/26 > some worsening respiratory distress 8/29 > admitted to ICU, intubated  SUBJECTIVE: Looks uncomfortable.  Weaning pressors.  Still on CVVHD. Copious purulent secretions   VITAL SIGNS: Temp:  [96.2 F (35.7 C)-97.6 F (36.4 C)] 97.5 F (36.4 C) (08/31 1141) Pulse Rate:  [25-90] 25 (08/31 1215) Resp:  [13-38] 20 (08/31 1215) BP: (71-175)/(24-152) 122/95 mmHg (08/31 1215) SpO2:  [78 %-100 %] 99 % (08/31 1215) FiO2 (%):  [40 %] 40 % (08/31 0819) Weight:  [133 lb 13.1 oz (60.7 kg)] 133 lb 13.1 oz (60.7 kg) (08/31 0430) HEMODYNAMICS: CVP:  [8 mmHg-27 mmHg] 17 mmHg VENTILATOR SETTINGS: Vent Mode:  [-] PRVC FiO2 (%):  [40 %] 40 % Set Rate:  [26 bmp] 26 bmp Vt Set:  [350 mL] 350 mL PEEP:  [5 cmH20] 5 cmH20 Plateau Pressure:  [16 cmH20-19 cmH20] 19 cmH20 INTAKE / OUTPUT:  Intake/Output Summary (Last 24 hours) at 05/14/15 1225 Last data filed at 05/14/15 1200  Gross per 24 hour  Intake 2783.05 ml  Output   3234 ml  Net -450.95 ml   PHYSICAL EXAMINATION: General:  Elderly, frail, chronically ill appearing female in NAD Neuro:  Awake, RASS 0, uncomfortable appearing at times, follows commands intermittently, PERRL. HEENT:  [place]/AT, no JVD noted. Cardiovascular: RRR, no MRG Lungs:  resps even non labored on vent, Coarse bilateral breath sounds Abdomen:  Soft, non-distended Musculoskeletal:  No acute deformity, +1 pitting edema to BLE Skin:  Grossly  intact  LABS:  CBC  Recent Labs Lab 04/16/2015 1933 04/23/2015 1945 05/14/15 0341  WBC 12.7*  --  28.5*  HGB 10.2* 11.9* 10.0*  HCT 32.0* 35.0* 31.1*  PLT 176  --  190   Coag's  Recent Labs Lab 04/27/2015 2338  INR 1.82*   BMET  Recent Labs Lab 05/13/15 1347 05/13/15 2228 05/14/15 0341  NA 135 132* 133*  K 4.4 4.1 4.0  CL 99* 97* 98*  CO2 20* 22 23  BUN 119* 84* 65*  CREATININE 5.23* 3.73* 3.00*  GLUCOSE 119* 218* 186*   Electrolytes  Recent Labs Lab 04/22/2015 2338  05/13/15 1347 05/13/15 2228 05/14/15 0341  CALCIUM 7.4*  < > 7.3* 7.3* 7.5*  MG 2.1  --   --   --  2.0  PHOS 6.3*  --   --   --  2.7  < > = values in this interval not displayed. Sepsis Markers  Recent Labs Lab 05/07/2015 1944 05/14/2015 2338 05/13/15 0802 05/14/15 0341  LATICACIDVEN 1.99 1.9 2.1*  --   PROCALCITON  --  13.19  --  13.77   ABG  Recent Labs Lab 05/13/15 0530 05/13/15 0809 05/14/15 0340  PHART 7.295* 7.314* 7.377  PCO2ART 38.1 34.5* 35.0  PO2ART 143* 80.0 71.0*   Liver Enzymes  Recent Labs Lab 05/08/2015 1933 05/07/2015 2338 05/14/15 0341  AST 58* 66*  --   ALT 18 17  --   ALKPHOS 142* 111  --  BILITOT 1.0 0.6  --   ALBUMIN 2.9* 2.2* 1.9*   Cardiac Enzymes  Recent Labs Lab 05/11/2015 2338  TROPONINI 0.07*   Glucose  Recent Labs Lab 05/13/15 1534 05/13/15 1919 05/13/15 2331 05/14/15 0308 05/14/15 0758 05/14/15 1135  GLUCAP 171* 185* 189* 183* 158* 114*   Imaging Dg Chest Port 1 View  05/14/2015   CLINICAL DATA:  Respiratory failure  EXAM: PORTABLE CHEST - 1 VIEW  COMPARISON:  05/13/2015  FINDINGS: Endotracheal tube tip is 2.9 cm above the carina. Nasogastric tube extends into the stomach. There is a right jugular sheath extending into the SVC. There is a left jugular central line extending into the SVC. Central and basilar ground-glass opacities persist and are worsened in the left base were there is more dense consolidation. No pneumothorax is evident.   IMPRESSION: Support equipment appears satisfactorily positioned.  Worsened left base consolidation. Persist a central and basilar ground-glass opacities.   Electronically Signed   By: Ellery Plunk M.D.   On: 05/14/2015 04:37   Dg Chest Port 1 View  05/13/2015   CLINICAL DATA:  Respiratory failure.  EXAM: PORTABLE CHEST - 1 VIEW  COMPARISON:  05/13/2015  FINDINGS: Left central line tip is in the SVC. No pneumothorax. Right internal jugular vascular line and endotracheal tube are unchanged. Left pacer is unchanged.  There is cardiomegaly. Bilateral airspace opacities likely reflect edema, slightly increased. Small layering bilateral effusions.  IMPRESSION: No active disease.   Electronically Signed   By: Charlett Nose M.D.   On: 05/13/2015 14:05   ASSESSMENT / PLAN:  PULMONARY OETT  8/29 >>> A: Acute on chronic hypercarbic respiratory failure COPD without acute exacerbation HCAP Pulmonary edema concerns  P:   Vent support - 8cc/kg  F/u CXR  F/u ABG  Volume removal as able with CVVH Follow CVP. Nebulized bronchodilators Repeat sputum culture    CARDIOVASCULAR R IJ HD catheter 8/29 >>> A:  Shock, suspect cardiogenic. Less likely septic Acute on chronic diastolic CHF PAF (not on anticoagulation), currently paced H/o CAD, cardiac arrest Pacemaker status 2D echo 8/30>>>EF 60-65%, grade 3 diastolic dysfunction, severely dilated LA, mod TR P:  Telemetry monitoring MAP goal > 63mm/hg Follow  Levophed titrate for MAP goal vasopressin. CVP monitoring  RENAL A:   Acute on CKD Hyponatremia, mild Volume overload Renal u/s 8/30>>> neg for hydro P:   Follow Bmet with hydration and BP support. Cont VVHD for now per renal - not candidate and does not want permanent HD Renal following   GASTROINTESTINAL A:   No acute issues  P:   TF as per nutrition. PPI  HEMATOLOGIC A:   Anemia of chronic illness  P:  Follow CBC Heparin sq for VTE ppx  INFECTIOUS A:   ? Septic  shock, marginally meets SIRS. Source unclear. Favor HCAP ? UTI/hyrdonephrosis, is anuric presently P:   BCx2 8/29 >>> UC 8/29 >>> Sputum 8/29 >>>  Aztreonam 8/29 >>> Vancomycin 8/29 >>> Levaquin 8/31>>>  PCT 13.19 Follow WBC and fever curve WBC cont to climb.  ?other source.    ENDOCRINE A:   DM2   P:   CBG monitoring and SSI. Cortisol 60 no need for stress dose steroids.  NEUROLOGIC A:   Acute metabolic encephalopathy  P:   RASS goal: -1 PRN fentanyl. Avoid benzo given renal function.  FAMILY  - Updates: Brother updated bedside 8/30, recommend DNR status and no further escalation.  He would like to speak with son and will let us  know decision.  Nephrology following, not a long term dialysis candidate and has refused in past.  ??how long to continue CVVHD  - Inter-disciplinary family meet or Palliative Care meeting due by:  9/5  Dirk Dress, NP 05/14/2015  12:26 PM Pager: [phone] or [phone]  Attending Note:  81 year old female with cardiogenic shock, respiratory failure and now ESRD on CRRT.  The patient is waking up more but remains on high O2 concentration and not weanable at this time.  Will need more volume off.  Son to arrive on Sunday then will discuss withdrawal of mechanical ventilation at that point.  The patient is critically ill with multiple organ systems failure and requires high complexity decision making for assessment and support, frequent evaluation and titration of therapies, application of advanced monitoring technologies and extensive interpretation of multiple databases.   Critical Care Time devoted to patient care services described in this note is  35  Minutes. This time reflects time of care of this signee Dr Koren Bound. This critical care time does not reflect procedure time, or teaching time or supervisory time of PA/NP/Med student/Med Resident etc but could involve care discussion time.  Alyson Reedy, M.D. [facility]  Pulmonary/Critical Care Medicine. Pager: [phone]. After hours pager: [phone].

## 2015-05-14 NOTE — Procedures (Signed)
Admit: May 24, 2015 LOS: 2  74F with shock, grade 3 dCHF, COPD, VDRF, anuric AoCKD5, chronic frailty  Current CRRT Prescription: Start Date: 05/13/2015 Catheter: R IJ BFR: 250 Pre Blood Pump: 500 4K DFR: 1000 4K Replacement Rate: 500 4K Goal UF: 0-65mL neg/hr Anticoagulation: None Clotting: none   S: TOlerating CRRT Removing fluid  O: 08/30 0701 - 08/31 0700 In: 2577.1 [I.V.:1347.1; NG/GT:930; IV Piggyback:300] Out: 2454 [Urine:55]  Filed Weights   05/13/15 0318 05/14/15 0430  Weight: 64.1 kg (141 lb 5 oz) 60.7 kg (133 lb 13.1 oz)     Recent Labs Lab 05/24/2015 2338  05/13/15 1347 05/13/15 2228 05/14/15 0341  NA 135  < > 135 132* 133*  K 3.7  < > 4.4 4.1 4.0  CL 102  < > 99* 97* 98*  CO2 18*  < > 20* 22 23  GLUCOSE 101*  < > 119* 218* 186*  BUN 114*  < > 119* 84* 65*  CREATININE 4.87*  < > 5.23* 3.73* 3.00*  CALCIUM 7.4*  < > 7.3* 7.3* 7.5*  PHOS 6.3*  --   --   --  2.7  < > = values in this interval not displayed.  Recent Labs Lab 2015/05/24 1933 05/24/15 1945 05/14/15 0341  WBC 12.7*  --  28.5*  NEUTROABS 11.8*  --   --   HGB 10.2* 11.9* 10.0*  HCT 32.0* 35.0* 31.1*  MCV 82.3  --  81.0  PLT 176  --  190    Scheduled Meds: . antiseptic oral rinse  7 mL Mouth Rinse QID  . aztreonam  1 g Intravenous Q8H  . chlorhexidine gluconate  15 mL Mouth Rinse BID  . Chlorhexidine Gluconate Cloth  6 each Topical Q0600  . heparin  5,000 Units Subcutaneous 3 times per day  . insulin aspart  0-9 Units Subcutaneous 6 times per day  . ipratropium-albuterol  3 mL Nebulization Q6H  . levofloxacin (LEVAQUIN) IV  250 mg Intravenous Q24H  . mupirocin ointment  1 application Nasal BID  . pantoprazole (PROTONIX) IV  40 mg Intravenous QHS  . sodium chloride  3 mL Intravenous Q12H  . vancomycin  750 mg Intravenous Q24H   Continuous Infusions: . feeding supplement (VITAL AF 1.2 CAL) 1,000 mL (05/13/15 2000)  . norepinephrine (LEVOPHED) Adult infusion 10 mcg/min (05/14/15  0811)  . dialysis replacement fluid (prismasate) 500 mL/hr at 05/14/15 0324  . dialysis replacement fluid (prismasate) 500 mL/hr at 05/14/15 0329  . dialysate (PRISMASATE) 1,000 mL/hr at 05/14/15 0740  . vasopressin (PITRESSIN) infusion - *FOR SHOCK* 0.03 Units/min (05/14/15 0810)   PRN Meds:.sodium chloride, sodium chloride, albuterol, fentaNYL (SUBLIMAZE) injection, fentaNYL (SUBLIMAZE) injection, heparin, heparin, sodium chloride, sodium chloride  ABG    Component Value Date/Time   PHART 7.377 05/14/2015 0340   PCO2ART 35.0 05/14/2015 0340   PO2ART 71.0* 05/14/2015 0340   HCO3 20.1 05/14/2015 0340   TCO2 21.2 05/14/2015 0340   ACIDBASEDEF 4.2* 05/14/2015 0340   O2SAT 93.8 05/14/2015 0340    A/P  1. Dialysis dependent AoCKD5 1. See note from 8/30 2. Plan for CRRT as short term attempt for stability 3. Not a candidate to transition to chronic HD 4. Tolerating fluid removal 5. Lytes ok today 6. Cont on current settings  2. ?HCAP on ABX per PCCM 3. Shock on 2 pressors sepsis most likely 4. Diastolic HF 5. VDRF, COPD on chronic oxygen 6. Frailty  Sabra Heck, MD [facility] pgr [phone]

## 2015-05-15 ENCOUNTER — Inpatient Hospital Stay (HOSPITAL_COMMUNITY): Payer: Medicare Other

## 2015-05-15 LAB — GLUCOSE, CAPILLARY
GLUCOSE-CAPILLARY: 116 mg/dL — AB (ref 65–99)
GLUCOSE-CAPILLARY: 118 mg/dL — AB (ref 65–99)
GLUCOSE-CAPILLARY: 141 mg/dL — AB (ref 65–99)
GLUCOSE-CAPILLARY: 97 mg/dL (ref 65–99)
Glucose-Capillary: 126 mg/dL — ABNORMAL HIGH (ref 65–99)
Glucose-Capillary: 133 mg/dL — ABNORMAL HIGH (ref 65–99)

## 2015-05-15 LAB — CBC
HCT: 31.8 % — ABNORMAL LOW (ref 36.0–46.0)
Hemoglobin: 10.2 g/dL — ABNORMAL LOW (ref 12.0–15.0)
MCH: 26.2 pg (ref 26.0–34.0)
MCHC: 32.1 g/dL (ref 30.0–36.0)
MCV: 81.7 fL (ref 78.0–100.0)
PLATELETS: 148 10*3/uL — AB (ref 150–400)
RBC: 3.89 MIL/uL (ref 3.87–5.11)
RDW: 16.5 % — AB (ref 11.5–15.5)
WBC: 23 10*3/uL — AB (ref 4.0–10.5)

## 2015-05-15 LAB — RENAL FUNCTION PANEL
ALBUMIN: 1.9 g/dL — AB (ref 3.5–5.0)
ALBUMIN: 1.8 g/dL — AB (ref 3.5–5.0)
ANION GAP: 11 (ref 5–15)
Anion gap: 8 (ref 5–15)
BUN: 22 mg/dL — ABNORMAL HIGH (ref 6–20)
BUN: 20 mg/dL (ref 6–20)
CALCIUM: 8 mg/dL — AB (ref 8.9–10.3)
CALCIUM: 8.1 mg/dL — AB (ref 8.9–10.3)
CHLORIDE: 101 mmol/L (ref 101–111)
CO2: 24 mmol/L (ref 22–32)
CO2: 26 mmol/L (ref 22–32)
CREATININE: 1.37 mg/dL — AB (ref 0.44–1.00)
CREATININE: 1.18 mg/dL — AB (ref 0.44–1.00)
Chloride: 99 mmol/L — ABNORMAL LOW (ref 101–111)
GFR calc Af Amer: 47 mL/min — ABNORMAL LOW (ref >60)
GFR calc non Af Amer: 34 mL/min — ABNORMAL LOW (ref >60)
GFR, EST AFRICAN AMERICAN: 40 mL/min — AB (ref >60)
GFR, EST NON AFRICAN AMERICAN: 41 mL/min — AB (ref >60)
GLUCOSE: 150 mg/dL — AB (ref 65–99)
Glucose, Bld: 177 mg/dL — ABNORMAL HIGH (ref 65–99)
PHOSPHORUS: 2 mg/dL — AB (ref 2.5–4.6)
Potassium: 4.5 mmol/L (ref 3.5–5.1)
Potassium: 4.3 mmol/L (ref 3.5–5.1)
SODIUM: 134 mmol/L — AB (ref 135–145)
SODIUM: 135 mmol/L (ref 135–145)

## 2015-05-15 LAB — BLOOD GAS, ARTERIAL
Acid-base deficit: 1.7 mmol/L (ref 0.0–2.0)
Bicarbonate: 22 mEq/L (ref 20.0–24.0)
FIO2: 0.4
MECHVT: 350 mL
O2 Saturation: 94.8 %
PATIENT TEMPERATURE: 98.6
PCO2 ART: 33.3 mmHg — AB (ref 35.0–45.0)
PEEP: 5 cmH2O
PO2 ART: 72 mmHg — AB (ref 80.0–100.0)
RATE: 26 resp/min
TCO2: 23 mmol/L (ref 0–100)
pH, Arterial: 7.434 (ref 7.350–7.450)

## 2015-05-15 LAB — CULTURE, RESPIRATORY

## 2015-05-15 LAB — PROCALCITONIN: PROCALCITONIN: 8.91 ng/mL

## 2015-05-15 LAB — MAGNESIUM: Magnesium: 2.4 mg/dL (ref 1.7–2.4)

## 2015-05-15 LAB — PHOSPHORUS: PHOSPHORUS: 1.9 mg/dL — AB (ref 2.5–4.6)

## 2015-05-15 MED ORDER — K PHOS MONO-SOD PHOS DI & MONO 155-852-130 MG PO TABS
250.0000 mg | ORAL_TABLET | Freq: Three times a day (TID) | ORAL | Status: AC
  Administered 2015-05-15 – 2015-05-16 (×4): 250 mg via ORAL
  Filled 2015-05-15 (×4): qty 1

## 2015-05-15 MED ORDER — ONDANSETRON HCL 4 MG/2ML IJ SOLN: 4.0000 mg | Freq: Four times a day (QID) | INTRAMUSCULAR | Status: DC | PRN

## 2015-05-15 MED ORDER — SODIUM PHOSPHATE 3 MMOLE/ML IV SOLN
30.0000 mmol | Freq: Once | INTRAVENOUS | Status: AC
  Administered 2015-05-15: 30 mmol via INTRAVENOUS
  Filled 2015-05-15: qty 10

## 2015-05-15 NOTE — Clinical Social Work Note (Signed)
Clinical Social Worker has faxed clinical documents to [facility] for review.   CSW will continue to follow patient and pt's family for continued support.  Derenda Fennel, MSW, LCSWA [phone] 05/15/2015 11:01 AM

## 2015-05-15 NOTE — Procedures (Signed)
Admit: 04/14/2015 LOS: 3  82F with shock, grade 3 dCHF, COPD, VDRF, anuric AoCKD5, chronic frailty  Current CRRT Prescription: Start Date: 05/13/2015 Catheter: R IJ BFR: 250 Pre Blood Pump: 500 4K DFR: 1000 4K Replacement Rate: 500 4K Goal UF: 0-52mL neg/hr Anticoagulation: None Clotting: none   S: Tolerating CRRT Removing fluid Off pressors Weight down  O: 08/31 0701 - 09/01 0700 In: 2098.6 [I.V.:608.6; NG/GT:1140; IV Piggyback:350] Out: 3396 [Stool:1]  Filed Weights   05/13/15 0318 05/14/15 0430 05/15/15 0500  Weight: 64.1 kg (141 lb 5 oz) 60.7 kg (133 lb 13.1 oz) 60.6 kg (133 lb 9.6 oz)     Recent Labs Lab 05/14/15 0341 05/14/15 1544 05/15/15 0516  NA 133* 136 134*  K 4.0 4.5 4.5  CL 98* 102 99*  CO2 GLUCOSE 186* 94 150*  BUN 65* 34* 22*  CREATININE 3.00* 1.79* 1.37*  CALCIUM 7.5* 7.9* 8.0*  PHOS 2.7 2.4* 1.9*  2.0*    Recent Labs Lab 05/04/2015 1933 04/19/2015 1945 05/14/15 0341 05/15/15 0516  WBC 12.7*  --  28.5* 23.0*  NEUTROABS 11.8*  --   --   --   HGB 10.2* 11.9* 10.0* 10.2*  HCT 32.0* 35.0* 31.1* 31.8*  MCV 82.3  --  81.0 81.7  PLT 176  --  190 148*    Scheduled Meds: . antiseptic oral rinse  7 mL Mouth Rinse QID  . aztreonam  1 g Intravenous Q8H  . chlorhexidine gluconate  15 mL Mouth Rinse BID  . Chlorhexidine Gluconate Cloth  6 each Topical Q0600  . heparin  5,000 Units Subcutaneous 3 times per day  . insulin aspart  0-9 Units Subcutaneous 6 times per day  . ipratropium-albuterol  3 mL Nebulization Q6H  . levofloxacin (LEVAQUIN) IV  250 mg Intravenous Q24H  . mupirocin ointment  1 application Nasal BID  . pantoprazole (PROTONIX) IV  40 mg Intravenous QHS  . sodium chloride  3 mL Intravenous Q12H  . vancomycin  750 mg Intravenous Q24H   Continuous Infusions: . feeding supplement (VITAL AF 1.2 CAL) 1,000 mL (05/14/15 1450)  . norepinephrine (LEVOPHED) Adult infusion Stopped (05/15/15 0546)  . dialysis replacement fluid  (prismasate) 500 mL/hr at 05/15/15 0938  . dialysis replacement fluid (prismasate) 500 mL/hr at 05/15/15 0942  . dialysate (PRISMASATE) 1,000 mL/hr at 05/15/15 0933  . vasopressin (PITRESSIN) infusion - *FOR SHOCK* Stopped (05/15/15 0507)   PRN Meds:.sodium chloride, sodium chloride, albuterol, fentaNYL (SUBLIMAZE) injection, heparin, heparin, ondansetron (ZOFRAN) IV, sodium chloride, sodium chloride  ABG    Component Value Date/Time   PHART 7.434 05/15/2015 0345   PCO2ART 33.3* 05/15/2015 0345   PO2ART 72.0* 05/15/2015 0345   HCO3 22.0 05/15/2015 0345   TCO2 23.0 05/15/2015 0345   ACIDBASEDEF 1.7 05/15/2015 0345   O2SAT 94.8 05/15/2015 0345    A/P  1. Dialysis dependent AoCKD5 1. See note from 8/30 2. Plan for CRRT as short term attempt for stability 3. Not a candidate to transition to chronic HD 4. Tolerating fluid removal 5. Lytes ok today 6. Cont on current settings  2. ?HCAP on ABX per PCCM 3. Shock improving off pressors 4. Diastolic HF 5. VDRF, COPD on chronic oxygen 6. Frailty  Sabra Heck, MD [facility] pgr [phone]

## 2015-05-15 NOTE — Care Management Important Message (Signed)
Important Message  Patient Details  Name: [name] MRN: [id number] Date of Birth: 1928-04-29   Medicare Important Message Given:  Yes-second notification given    Kyla Balzarine 05/15/2015, 10:11 AMImportant Message  Patient Details  Name: [name] MRN: [id number] Date of Birth: 1928/02/01   Medicare Important Message Given:  Yes-second notification given    Kyla Balzarine 05/15/2015, 10:10 AM

## 2015-05-15 NOTE — Progress Notes (Signed)
eLink Physician-Brief Progress Note Patient Name: CALIYAH SIEH DOB: [birth year] MRN: [id number]   Date of Service  05/15/2015  HPI/Events of Note  Phos very low  eICU Interventions  Start oral supp, repeat in AM     Intervention Category Minor Interventions: Electrolytes abnormality - evaluation and management  Keymari Sato 05/15/2015, 5:02 PM

## 2015-05-15 NOTE — Progress Notes (Signed)
PULMONARY / CRITICAL CARE MEDICINE   Name: [name] MRN: [id number] DOB: 08-24-1928    ADMISSION DATE:  05/10/2015 CONSULTATION DATE:  05/11/2015  REFERRING MD :  EDP  CHIEF COMPLAINT:  AMS  INITIAL PRESENTATION: 82 year old female from [place] presented to [facility] ED 8/29 with AMS. Recent complaints of respiratory distress and confusion. In ED was intubated for airway protection. Found to be in renal failure with pulmonary edema on CXR. PCCM to admit.   STUDIES:  8/29 CT head > no acute findings  SIGNIFICANT EVENTS: 8/26 > some worsening respiratory distress 8/29 > admitted to ICU, intubated  SUBJECTIVE: Looks uncomfortable.  Weaning pressors.  Still on CVVHD. Copious purulent secretions   VITAL SIGNS: Temp:  [96.6 F (35.9 C)-98.6 F (37 C)] 97.5 F (36.4 C) (09/01 0754) Pulse Rate:  [25-89] 58 (09/01 0800) Resp:  [13-38] 21 (09/01 0900) BP: (61-142)/(31-95) 101/49 mmHg (09/01 0900) SpO2:  [78 %-100 %] 96 % (09/01 0800) FiO2 (%):  [40 %] 40 % (09/01 0800) Weight:  [60.6 kg (133 lb 9.6 oz)] 60.6 kg (133 lb 9.6 oz) (09/01 0500) HEMODYNAMICS: CVP:  [10 mmHg-17 mmHg] 10 mmHg VENTILATOR SETTINGS: Vent Mode:  [-] PSV;CPAP FiO2 (%):  [40 %] 40 % Set Rate:  [26 bmp] 26 bmp Vt Set:  [350 mL] 350 mL PEEP:  [5 cmH20] 5 cmH20 Pressure Support:  [12 cmH20] 12 cmH20 Plateau Pressure:  [13 cmH20-22 cmH20] 13 cmH20 INTAKE / OUTPUT:  Intake/Output Summary (Last 24 hours) at 05/15/15 1015 Last data filed at 05/15/15 0900  Gross per 24 hour  Intake 1826.51 ml  Output   3180 ml  Net -1353.49 ml   PHYSICAL EXAMINATION: General:  Elderly, frail, chronically ill appearing female in NAD Neuro:  Awake, RASS 0, uncomfortable appearing at times, follows commands intermittently, PERRL. HEENT:  [place]/AT, no JVD noted. Cardiovascular: RRR, no MRG Lungs:  resps even non labored on vent, Coarse bilateral breath sounds Abdomen:  Soft, non-distended Musculoskeletal:  No acute deformity,  +1 pitting edema to BLE Skin:  Grossly intact  LABS:  CBC  Recent Labs Lab 05/11/2015 1933 05/06/2015 1945 05/14/15 0341 05/15/15 0516  WBC 12.7*  --  28.5* 23.0*  HGB 10.2* 11.9* 10.0* 10.2*  HCT 32.0* 35.0* 31.1* 31.8*  PLT 176  --  190 148*   Coag's  Recent Labs Lab 04/16/2015 2338  INR 1.82*   BMET  Recent Labs Lab 05/14/15 0341 05/14/15 1544 05/15/15 0516  NA 133* 136 134*  K 4.0 4.5 4.5  CL 98* 102 99*  CO2 BUN 65* 34* 22*  CREATININE 3.00* 1.79* 1.37*  GLUCOSE 186* 94 150*   Electrolytes  Recent Labs Lab 04/18/2015 2338  05/14/15 0341 05/14/15 1544 05/15/15 0516  CALCIUM 7.4*  < > 7.5* 7.9* 8.0*  MG 2.1  --  2.0  --  2.4  PHOS 6.3*  --  2.7 2.4* 1.9*  2.0*  < > = values in this interval not displayed. Sepsis Markers  Recent Labs Lab 05/11/2015 1944 04/29/2015 2338 05/13/15 0802 05/14/15 0341 05/15/15 0516  LATICACIDVEN 1.99 1.9 2.1*  --   --   PROCALCITON  --  13.19  --  13.77 8.91   ABG  Recent Labs Lab 05/13/15 0809 05/14/15 0340 05/15/15 0345  PHART 7.314* 7.377 7.434  PCO2ART 34.5* 35.0 33.3*  PO2ART 80.0 71.0* 72.0*   Liver Enzymes  Recent Labs Lab 05/07/2015 1933 05/11/2015 2338 05/14/15 0341 05/14/15 1544 05/15/15  0516  AST 58* 66*  --   --   --   ALT 18 17  --   --   --   ALKPHOS 142* 111  --   --   --   BILITOT 1.0 0.6  --   --   --   ALBUMIN 2.9* 2.2* 1.9* 1.9* 1.9*   Cardiac Enzymes  Recent Labs Lab May 31, 2015 2338  TROPONINI 0.07*   Glucose  Recent Labs Lab 05/14/15 1135 05/14/15 1603 05/14/15 1929 05/14/15 2353 05/15/15 0326 05/15/15 0752  GLUCAP 114* 92 122* 97 126* 133*   Imaging Dg Chest Port 1 View  05/15/2015   CLINICAL DATA:  Acute respiratory failure, cardiogenic shock, intubated patient.  EXAM: PORTABLE CHEST - 1 VIEW  COMPARISON:  Portable chest x-ray of May 14, 2015  FINDINGS: The lungs are adequately inflated. There is stable increased density in the retrocardiac region with  obscuration of the hemidiaphragm. Confluent interstitial and early alveolar opacities in the right mid and lower lung are more conspicuous today. The cardiac silhouette remains enlarged. The pulmonary vascularity is indistinct.  The endotracheal tube tip lies 3 cm above the carina. The esophagogastric tube tip projects below the inferior margin of the image. The left internal jugular venous catheter tip projects at the junction of the right and left brachiocephalic veins with its tip directed cephalad to the right of midline. This is stable. The dual-lumen large caliber right internal jugular catheter tip projects over the junction of the proximal and midportions of the SVC and is stable. The permanent pacemaker is unchanged.  IMPRESSION: Persistent bibasilar interstitial and alveolar opacities consistent with pulmonary edema or pneumonia. Left lower lobe atelectasis and left pleural effusion are stable. Support tubes are in reasonable position.   Electronically Signed   By: David  [place] M.D.   On: 05/15/2015 07:26   ASSESSMENT / PLAN:  PULMONARY OETT  8/29 >>> A: Acute on chronic hypercarbic respiratory failure COPD without acute exacerbation HCAP Pulmonary edema concerns  P:   Vent support - 8cc/kg  F/u CXR  F/u ABG  Volume removal as able with CVVH. Follow CVP. Nebulized bronchodilators  CARDIOVASCULAR R IJ HD catheter 8/29 >>> A:  Shock, suspect cardiogenic. Less likely septic Acute on chronic diastolic CHF PAF (not on anticoagulation), currently paced H/o CAD, cardiac arrest Pacemaker status 2D echo 8/30>>>EF 60-65%, grade 3 diastolic dysfunction, severely dilated LA, mod TR P:  Telemetry monitoring MAP goal > 54mm/hg Follow  Levophed titrate for MAP goal vasopressin. CVP monitoring  RENAL A:   Acute on CKD Hyponatremia, mild Volume overload Renal u/s 8/30>>> neg for hydro P:   Follow Bmet with hydration and BP support. Cont VVHD for now per renal - not candidate  and does not want permanent HD Renal following   GASTROINTESTINAL A:   No acute issues  P:   TF as per nutrition. PPI  HEMATOLOGIC A:   Anemia of chronic illness  P:  Follow CBC Heparin sq for VTE ppx  INFECTIOUS A:   ? Septic shock, marginally meets SIRS. Source unclear. Favor HCAP ? UTI/hyrdonephrosis, is anuric presently P:   BCx2 8/29 >>> UC 8/29 >>> Sputum 8/29 >>>  Aztreonam 8/29 >>> Vancomycin 8/29 >>> Levaquin 8/31>>>  PCT 13.19 Follow WBC and fever curve WBC cont to climb.  ?other source.   ENDOCRINE A:   DM2   P:   CBG monitoring and SSI. Cortisol 60 no need for stress dose steroids.  NEUROLOGIC A:  Acute metabolic encephalopathy  P:   RASS goal: -1 PRN fentanyl. Avoid benzo given renal function.  FAMILY  - Updates: Spoke with son and brother extensively on 8/31, after discussion, decision is made to make patient LCB with no CPR/cardioversion.  Continue CRRT until Sunday, if improving will continue support and if not then will terminally withdraw.   - Inter-disciplinary family meet or Palliative Care meeting due by:  9/5  The patient is critically ill with multiple organ systems failure and requires high complexity decision making for assessment and support, frequent evaluation and titration of therapies, application of advanced monitoring technologies and extensive interpretation of multiple databases.   Critical Care Time devoted to patient care services described in this note is  35  Minutes. This time reflects time of care of this signee Dr Koren Bound. This critical care time does not reflect procedure time, or teaching time or supervisory time of PA/NP/Med student/Med Resident etc but could involve care discussion time.  Alyson Reedy, M.D. [facility] Pulmonary/Critical Care Medicine. Pager: [phone]. After hours pager: [phone].

## 2015-05-15 DEATH — deceased

## 2015-05-16 ENCOUNTER — Inpatient Hospital Stay (HOSPITAL_COMMUNITY): Payer: Medicare Other

## 2015-05-16 LAB — BLOOD GAS, ARTERIAL
ACID-BASE EXCESS: 0.1 mmol/L (ref 0.0–2.0)
BICARBONATE: 24 meq/L (ref 20.0–24.0)
Drawn by: 42624
FIO2: 0.4
LHR: 26 {breaths}/min
O2 Saturation: 96.5 %
PATIENT TEMPERATURE: 98.6
PCO2 ART: 37.6 mmHg (ref 35.0–45.0)
PEEP/CPAP: 5 cmH2O
TCO2: 25.2 mmol/L (ref 0–100)
VT: 350 mL
pH, Arterial: 7.422 (ref 7.350–7.450)
pO2, Arterial: 83.1 mmHg (ref 80.0–100.0)

## 2015-05-16 LAB — RENAL FUNCTION PANEL
Albumin: 1.9 g/dL — ABNORMAL LOW (ref 3.5–5.0)
Albumin: 1.7 g/dL — ABNORMAL LOW (ref 3.5–5.0)
Anion gap: 7 (ref 5–15)
Anion gap: 8 (ref 5–15)
BUN: 19 mg/dL (ref 6–20)
BUN: 21 mg/dL — AB (ref 6–20)
CHLORIDE: 101 mmol/L (ref 101–111)
CHLORIDE: 104 mmol/L (ref 101–111)
CO2: 27 mmol/L (ref 22–32)
CO2: 26 mmol/L (ref 22–32)
CREATININE: 0.96 mg/dL (ref 0.44–1.00)
Calcium: 8.2 mg/dL — ABNORMAL LOW (ref 8.9–10.3)
Calcium: 7.9 mg/dL — ABNORMAL LOW (ref 8.9–10.3)
Creatinine, Ser: 0.95 mg/dL (ref 0.44–1.00)
GFR calc Af Amer: >60 mL/min (ref >60)
GFR calc non Af Amer: 52 mL/min — ABNORMAL LOW (ref >60)
GFR, EST NON AFRICAN AMERICAN: 53 mL/min — AB (ref >60)
Glucose, Bld: 137 mg/dL — ABNORMAL HIGH (ref 65–99)
Glucose, Bld: 148 mg/dL — ABNORMAL HIGH (ref 65–99)
POTASSIUM: 3.6 mmol/L (ref 3.5–5.1)
Phosphorus: 2.2 mg/dL — ABNORMAL LOW (ref 2.5–4.6)
Phosphorus: 1.1 mg/dL — ABNORMAL LOW (ref 2.5–4.6)
Potassium: 3.8 mmol/L (ref 3.5–5.1)
Sodium: 135 mmol/L (ref 135–145)
Sodium: 138 mmol/L (ref 135–145)

## 2015-05-16 LAB — C DIFFICILE QUICK SCREEN W PCR REFLEX
C DIFFICILE (CDIFF) TOXIN: NEGATIVE
C DIFFICLE (CDIFF) ANTIGEN: NEGATIVE
C Diff interpretation: NEGATIVE

## 2015-05-16 LAB — CBC
HEMATOCRIT: 32.8 % — AB (ref 36.0–46.0)
HEMOGLOBIN: 10.5 g/dL — AB (ref 12.0–15.0)
MCH: 26.1 pg (ref 26.0–34.0)
MCHC: 32 g/dL (ref 30.0–36.0)
MCV: 81.4 fL (ref 78.0–100.0)
Platelets: 137 10*3/uL — ABNORMAL LOW (ref 150–400)
RBC: 4.03 MIL/uL (ref 3.87–5.11)
RDW: 16.6 % — AB (ref 11.5–15.5)
WBC: 29.6 10*3/uL — AB (ref 4.0–10.5)

## 2015-05-16 LAB — GLUCOSE, CAPILLARY
GLUCOSE-CAPILLARY: 128 mg/dL — AB (ref 65–99)
Glucose-Capillary: 126 mg/dL — ABNORMAL HIGH (ref 65–99)
Glucose-Capillary: 122 mg/dL — ABNORMAL HIGH (ref 65–99)
Glucose-Capillary: 122 mg/dL — ABNORMAL HIGH (ref 65–99)
Glucose-Capillary: 104 mg/dL — ABNORMAL HIGH (ref 65–99)
Glucose-Capillary: 120 mg/dL — ABNORMAL HIGH (ref 65–99)

## 2015-05-16 LAB — MAGNESIUM: Magnesium: 2.5 mg/dL — ABNORMAL HIGH (ref 1.7–2.4)

## 2015-05-16 NOTE — Care Management Note (Addendum)
Case Management Note  Patient Details  Name: [name] MRN: [id number] Date of Birth: [birth year]  Subjective/Objective:   Adm w acute kidney injury                Action/Plan: from [facility]  Expected Discharge Date:                 Expected Discharge Plan:    In-House Referral:  Clinical Social Work  Discharge planning Services  CM Consult  Post Acute Care Choice:   Choice offered to:     DME Arranged:    DME Agency:     HH Arranged:   HH Agency:     Status of Service:  Completed, signed off  Medicare Important Message Given:  Yes-second notification given Date Medicare IM Given:    Medicare IM give by:    Date Additional Medicare IM Given:    Additional Medicare Important Message give by:     If discussed at Long Length of Stay Meetings, dates discussed:    Additional Comments: sw aware  pt from [facility] Hanley Hays, RN 05/16/2015, 10:28 AM

## 2015-05-16 NOTE — Progress Notes (Signed)
CRITICAL VALUE ALERT  Critical value received:  Blood culture positive for GRAM POSITIVE RODS  Date of notification:  05/16/15  Time of notification:  0900  Critical value read back:Yes.    Nurse who received alert:  Tory Emerald, RN  MD notified (1st page):  Dr Molli Knock  Time of first page:  1015  MD notified (2nd page):  Time of second page:  Responding MD:  Dr Molli Knock  Time MD responded:  [phone]

## 2015-05-16 NOTE — Progress Notes (Signed)
RT switch patient over to [facility], patient started breath stacking and RR up in the high 30s. RT switch patient back to PSV/CPAP and RR dropped to 19 and No breath stacking. RT will leave patient on PSV/ CPAP 14/5. RT will continue to monitor.

## 2015-05-16 NOTE — Procedures (Signed)
CVP line changed without complications.  

## 2015-05-16 NOTE — Progress Notes (Signed)
PULMONARY / CRITICAL CARE MEDICINE   Name: [name] MRN: [id number] DOB: 1928/03/08    ADMISSION DATE:  04/28/2015 CONSULTATION DATE:  05/02/2015  REFERRING MD :  EDP  CHIEF COMPLAINT:  AMS  INITIAL PRESENTATION: 81 year old female from [place] presented to [facility] ED 8/29 with AMS. Recent complaints of respiratory distress and confusion. In ED was intubated for airway protection. Found to be in renal failure with pulmonary edema on CXR. PCCM to admit.   STUDIES:  8/29 CT head > no acute findings  SIGNIFICANT EVENTS: 8/26 > some worsening respiratory distress 8/29 > admitted to ICU, intubated  SUBJECTIVE: Looks uncomfortable.  Weaning pressors.  Still on CVVHD. Copious purulent secretions   VITAL SIGNS: Temp:  [96.6 F (35.9 C)-97.4 F (36.3 C)] 97.4 F (36.3 C) (09/02 0752) Pulse Rate:  [38-92] 38 (09/02 0900) Resp:  [19-37] 24 (09/02 0930) BP: (97-153)/(43-98) 108/60 mmHg (09/02 0930) SpO2:  [87 %-100 %] 100 % (09/02 0900) FiO2 (%):  [40 %] 40 % (09/02 0805) Weight:  [56.5 kg (124 lb 9 oz)] 56.5 kg (124 lb 9 oz) (09/02 0100) HEMODYNAMICS: CVP:  [4 mmHg-9 mmHg] 8 mmHg VENTILATOR SETTINGS: Vent Mode:  [-] PSV;CPAP FiO2 (%):  [40 %] 40 % Set Rate:  [26 bmp] 26 bmp Vt Set:  [350 mL] 350 mL PEEP:  [5 cmH20] 5 cmH20 Pressure Support:  [12 cmH20] 12 cmH20 INTAKE / OUTPUT:  Intake/Output Summary (Last 24 hours) at 05/16/15 1007 Last data filed at 05/16/15 7829  Gross per 24 hour  Intake   1863 ml  Output   2784 ml  Net   -921 ml   PHYSICAL EXAMINATION: General:  Elderly, frail, chronically ill appearing female in NAD Neuro:  Awake, RASS 0, uncomfortable appearing at times, follows commands intermittently, PERRL. HEENT:  [place]/AT, no JVD noted. Cardiovascular: RRR, no MRG Lungs:  resps even non labored on vent, Coarse bilateral breath sounds Abdomen:  Soft, non-distended Musculoskeletal:  No acute deformity, +1 pitting edema to BLE Skin:  Grossly  intact  LABS:  CBC  Recent Labs Lab 05/14/15 0341 05/15/15 0516 05/16/15 0550  WBC 28.5* 23.0* 29.6*  HGB 10.0* 10.2* 10.5*  HCT 31.1* 31.8* 32.8*  PLT 190 148* 137*   Coag's  Recent Labs Lab 04/30/2015 2338  INR 1.82*   BMET  Recent Labs Lab 05/15/15 0516 05/15/15 1525 05/16/15 0546  NA 134* 135 135  K 4.5 4.3 3.8  CL 99* 101 101  CO2 24 26 27   BUN 22* 20 19  CREATININE 1.37* 1.18* 0.96  GLUCOSE 150* 177* 137*   Electrolytes  Recent Labs Lab 05/14/15 0341  05/15/15 0516 05/15/15 1525 05/16/15 0546  CALCIUM 7.5*  < > 8.0* 8.1* 8.2*  MG 2.0  --  2.4  --  2.5*  PHOS 2.7  < > 1.9*  2.0* <1.0* 2.2*  < > = values in this interval not displayed. Sepsis Markers  Recent Labs Lab 05/09/2015 1944 05/01/2015 2338 05/13/15 0802 05/14/15 0341 05/15/15 0516  LATICACIDVEN 1.99 1.9 2.1*  --   --   PROCALCITON  --  13.19  --  13.77 8.91   ABG  Recent Labs Lab 05/14/15 0340 05/15/15 0345 05/16/15 0300  PHART 7.377 7.434 7.422  PCO2ART 35.0 33.3* 37.6  PO2ART 71.0* 72.0* 83.1   Liver Enzymes  Recent Labs Lab 05/11/2015 1933 04/28/2015 2338  05/15/15 0516 05/15/15 1525 05/16/15 0546  AST 58* 66*  --   --   --   --  ALT 18 17  --   --   --   --   ALKPHOS 142* 111  --   --   --   --   BILITOT 1.0 0.6  --   --   --   --   ALBUMIN 2.9* 2.2*  < > 1.9* 1.8* 1.9*  < > = values in this interval not displayed. Cardiac Enzymes  Recent Labs Lab 04/30/2015 2338  TROPONINI 0.07*   Glucose  Recent Labs Lab 05/15/15 1143 05/15/15 1543 05/15/15 1938 05/15/15 2331 05/16/15 0358 05/16/15 0750  GLUCAP 116* 118* 141* 128* 126* 122*   Imaging Dg Chest Port 1 View  05/16/2015   CLINICAL DATA:  Intubated patient, acute respiratory failure, cardiogenic shock  EXAM: PORTABLE CHEST - 1 VIEW  COMPARISON:  Portable chest x-ray of May 15, 2015  FINDINGS: The lungs are adequately inflated. There is persistent left lower lobe atelectasis and small pleural effusion.  There is subsegmental atelectasis at the right lung base. The heart is mildly enlarged but stable. The central pulmonary vascularity is prominent. The pulmonary interstitial markings remain increased bilaterally.  The endotracheal tube tip lies approximately 3.2 cm above the carina. The esophagogastric tube tip projects below the inferior margin of the image. The right internal jugular catheter tip projects over the proximal SVC. The permanent pacemaker is in reasonable position. The left internal jugular venous catheter tip is projected cephalad at the junction of the right and left brachiocephalic veins.  IMPRESSION: Slight interval improvement in pulmonary interstitial edema. Slightly improved bibasilar bibasilar atelectasis.   Electronically Signed   By: David  [place] M.D.   On: 05/16/2015 07:48   ASSESSMENT / PLAN:  PULMONARY OETT  8/29 >>> A: Acute on chronic hypercarbic respiratory failure COPD without acute exacerbation HCAP Pulmonary edema concerns  P:   Vent support - 8cc/kg  Begin PS trials but no extubation given mental status. F/u CXR in AM. F/u ABG in AM. Volume removal as able with CVVH, -50 ml/hr. Follow CVP. Nebulized bronchodilators  CARDIOVASCULAR R IJ HD catheter 8/29 >>> A:  Shock, suspect cardiogenic. Less likely septic Acute on chronic diastolic CHF PAF (not on anticoagulation), currently paced H/o CAD, cardiac arrest Pacemaker status 2D echo 8/30>>>EF 60-65%, grade 3 diastolic dysfunction, severely dilated LA, mod TR P:  Telemetry monitoring MAP goal > 38mm/hg Follow  D/C levophed and vasopressin. CVP monitoring  RENAL A:   Acute on CKD Hyponatremia, mild Volume overload Renal u/s 8/30>>> neg for hydro P:   Follow Bmet with hydration and BP support. Cont VVHD for now per renal - not candidate and does not want permanent HD. Renal following. Replace electrolytes as indicated.  GASTROINTESTINAL A:   No acute issues  P:   TF as per  nutrition. PPI.  HEMATOLOGIC A:   Anemia of chronic illness  P:  Follow CBC Heparin sq for VTE ppx  INFECTIOUS A:   ? Septic shock, marginally meets SIRS. Source unclear. Favor HCAP ? UTI/hyrdonephrosis, is anuric presently P:   BCx2 8/29 >>>gram positive rod anaerobic UC 8/29 >>>NTD Sputum 8/29 >>>NTD  Aztreonam 8/29 >>> Vancomycin 8/29 >>> Levaquin 8/31>>>  PCT 13.19 Follow WBC and fever curve WBC cont to climb.  ?other source.   ENDOCRINE A:   DM2   P:   CBG monitoring and SSI. Cortisol 60 no need for stress dose steroids.  NEUROLOGIC A:   Acute metabolic encephalopathy  P:   RASS goal: -1 PRN fentanyl. Avoid benzo given renal  function.  FAMILY  - Updates: Spoke with son and brother extensively on 8/31, after discussion, decision is made to make patient LCB with no CPR/cardioversion.  Continue CRRT until Sunday, if improving will continue support and if not then will terminally withdraw.   - Inter-disciplinary family meet or Palliative Care meeting due by:  9/5  The patient is critically ill with multiple organ systems failure and requires high complexity decision making for assessment and support, frequent evaluation and titration of therapies, application of advanced monitoring technologies and extensive interpretation of multiple databases.   Critical Care Time devoted to patient care services described in this note is  35  Minutes. This time reflects time of care of this signee Dr Koren Bound. This critical care time does not reflect procedure time, or teaching time or supervisory time of PA/NP/Med student/Med Resident etc but could involve care discussion time.  Alyson Reedy, M.D. [facility] Pulmonary/Critical Care Medicine. Pager: [phone]. After hours pager: [phone].

## 2015-05-16 NOTE — Progress Notes (Signed)
ANTIBIOTIC CONSULT NOTE - FOLLOW UP  Pharmacy Consult for Vancomycin and Aztreonam and Levaquin Indication: Sepsis  Allergies  Allergen Reactions  . Codeine     REACTION: unspecified  . Doxycycline   . Guaifenesin & Derivatives   . Penicillins     REACTION: unspecified    Patient Measurements: Height: 4\' 11"  (149.9 cm) Weight: 124 lb 9 oz (56.5 kg) IBW/kg (Calculated) : 43.2  Vital Signs: Temp: 97.4 F (36.3 C) (09/02 0752) Temp Source: Axillary (09/02 0752) BP: 83/55 mmHg (09/02 1030) Pulse Rate: 73 (09/02 1000) Intake/Output from previous day: 09/01 0701 - 09/02 0700 In: 1918 [I.V.:185; NG/GT:1125; IV Piggyback:608] Out: 2879  Intake/Output from this shift: Total I/O In: 295 [I.V.:30; NG/GT:165; IV Piggyback:100] Out: 412 [Other:412]  Labs:  Recent Labs  05/14/15 0341  05/15/15 0516 05/15/15 1525 05/16/15 0546 05/16/15 0550  WBC 28.5*  --  23.0*  --   --  29.6*  HGB 10.0*  --  10.2*  --   --  10.5*  PLT 190  --  148*  --   --  137*  CREATININE 3.00*  < > 1.37* 1.18* 0.96  --   < > = values in this interval not displayed. Estimated Creatinine Clearance: 32.8 mL/min (by C-G formula based on Cr of 0.96).  Recent Labs  05/14/15 0340  [facility] 6     Microbiology: Recent Results (from the past 720 hour(s))  Culture, blood (routine x 2)     Status: None (Preliminary result)   Collection Time: 05/08/2015  7:30 PM  Result Value Ref Range Status   Specimen Description BLOOD RIGHT ANTECUBITAL  Final   Special Requests BOTTLES DRAWN AEROBIC AND ANAEROBIC 5CC  Final   Culture  Setup Time   Final    GRAM POSITIVE RODS ANAEROBIC BOTTLE ONLY CRITICAL RESULT CALLED TO, READ BACK BY AND VERIFIED WITH: Mirian Capuchin, RN AT [id number] ON [id number] BY Lucienne Capers Performed at [facility]    Culture PENDING  Incomplete   Report Status PENDING  Incomplete  Urine culture     Status: None   Collection Time: 04/21/2015  7:35 PM  Result Value Ref Range Status   Specimen  Description URINE, CATHETERIZED  Final   Special Requests NONE  Final   Culture   Final    NO GROWTH 1 DAY Performed at [facility]    Report Status 05/13/2015 FINAL  Final  Culture, blood (routine x 2)     Status: None (Preliminary result)   Collection Time: 05/01/2015  7:38 PM  Result Value Ref Range Status   Specimen Description BLOOD LEFT HAND  Final   Special Requests BOTTLES DRAWN AEROBIC AND ANAEROBIC 5CC  Final   Culture   Final    NO GROWTH 3 DAYS Performed at [facility]    Report Status PENDING  Incomplete  Culture, respiratory (tracheal aspirate)     Status: None   Collection Time: 05/13/15  1:21 AM  Result Value Ref Range Status   Specimen Description TRACHEAL ASPIRATE  Final   Special Requests NONE  Final   Gram Stain   Final    ABUNDANT WBC PRESENT, PREDOMINANTLY PMN NO SQUAMOUS EPITHELIAL CELLS SEEN ABUNDANT GRAM NEGATIVE RODS RARE GRAM POSITIVE COCCI IN PAIRS    Culture   Final    ABUNDANT HAEMOPHILUS INFLUENZAE Note: BETA LACTAMASE POSITIVE Performed at [facility]    Report Status 05/15/2015 FINAL  Final  MRSA PCR Screening  Status: Abnormal   Collection Time: 05/13/15  3:09 AM  Result Value Ref Range Status   MRSA by PCR POSITIVE (A) NEGATIVE Corrected    Comment: RESULT CALLED TO, READ BACK BY AND VERIFIED WITH: B.SHEPPARD @ 1610 05/13/15 M.KELLY        The GeneXpert MRSA Assay (FDA approved for NASAL specimens only), is one component of a comprehensive MRSA colonization surveillance program. It is not intended to diagnose MRSA infection nor to guide or monitor treatment for MRSA infections. CORRECTED ON 08/30 AT 9604: PREVIOUSLY REPORTED AS RESISTANT        The GeneXpert MRSA Assay (FDA approved for NASAL specimens only), is one component of a comprehensive MRSA colonization surveillance program. It is not intended to diagnose MRSA  infection nor to guide or monitor treatment for MRSA infections. RESULT CALLED TO, READ  BACK BY AND VERIFIED WITH: B SHEPPARD@0433  05/13/15 MKELLY   Culture, respiratory (NON-Expectorated)     Status: None (Preliminary result)   Collection Time: 05/14/15 12:38 PM  Result Value Ref Range Status   Specimen Description TRACHEAL ASPIRATE  Final   Special Requests NONE  Final   Gram Stain   Final    MODERATE WBC PRESENT, PREDOMINANTLY PMN NO SQUAMOUS EPITHELIAL CELLS SEEN NO ORGANISMS SEEN Performed at [facility]    Culture   Final    RARE CANDIDA ALBICANS Performed at [facility]    Report Status PENDING  Incomplete  C difficile quick scan w PCR reflex     Status: None   Collection Time: 05/16/15  4:31 AM  Result Value Ref Range Status   C Diff antigen NEGATIVE NEGATIVE Final   C Diff toxin NEGATIVE NEGATIVE Final   C Diff interpretation Negative for toxigenic C. difficile  Final    Medical History: Past Medical History  Diagnosis Date  . COPD (chronic obstructive pulmonary disease)   . Diastolic congestive heart failure   . C. difficile colitis   . Hypertension   . Hyperlipidemia   . CAD (coronary artery disease)   . Anemia   . Type 2 diabetes mellitus   . Pacemaker 2002  . Cardiac arrest - asystole 2002  . Gallstones   . Pulmonary hypertension   . Renal insufficiency   . Osteoarthritis   . Allergic rhinitis   . Gout   . Stasis dermatitis   . Sinoatrial node dysfunction   . Syncope   . Postsurgical percutaneous transluminal coronary angioplasty status   . Dyslipidemia   . Acute bronchitis   . History of cholelithiasis   . Obesity    Assessment: 67 yoF from [place] presented to [facility] 8/29 with AMS. Required intubation in ED. Currently on Vanc, levofloxacin and Aztreonam for r/o sepsis - unknown source. CCM favoring HCAP. Currently also on CRRT. Scr down to 0.96. CrCl up to 32 ml/min. WBC elevated to 29.6.   Anti-infectives 8/29 Vancomycin  >> 8/29 Aztreonam >> 8/30 Levaquin>>  Cultures 8/29 bloodx2: 1/2 anaerobic GNR  8/29 urine:  ngtd 8/30 trach asp: ngtd  Goal of Therapy:  Vancomycin trough level 15-20 mcg/ml  Plan:  -Continue vancomycin to 750 mg IV q24h -Continue levofloxacin to 250 mg IV q24h -Continue aztreonam to 1 g IV q8h -Watch renal plans -Deescalate as able -VR in 1-2 days   Vinnie Level, PharmD., BCPS Clinical Pharmacist Pager [phone]

## 2015-05-16 NOTE — Progress Notes (Signed)
Pt having >3 loose stools in 24 hours.  Pt placed on enteric contact precautions.  Joneen Roach, NP notified.  Order received for C. Diff.  Family not at bedside for education currently.  Will attempt to educate in AM.

## 2015-05-16 NOTE — Procedures (Signed)
Admit: 05/30/15 LOS: 4  61F with shock, grade 3 dCHF, COPD, VDRF, anuric AoCKD5, chronic frailty  Current CRRT Prescription: Start Date: 05/13/2015 Catheter: R IJ BFR: 250 Pre Blood Pump: 500 4K DFR: 1000 4K Replacement Rate: 500 4K Goal UF: 50mL neg/hr Anticoagulation: None Clotting: seldom   S: Tolerating CRRT Removing fluid Off pressors Weight down On PSV  O: 09/01 0701 - 09/02 0700 In: 1918 [I.V.:185; NG/GT:1125; IV Piggyback:608] Out: 2879   Filed Weights   05/14/15 0430 05/15/15 0500 05/16/15 0100  Weight: 60.7 kg (133 lb 13.1 oz) 60.6 kg (133 lb 9.6 oz) 56.5 kg (124 lb 9 oz)     Recent Labs Lab 05/15/15 0516 05/15/15 1525 05/16/15 0546  NA 134* 135 135  K 4.5 4.3 3.8  CL 99* 101 101  CO2 GLUCOSE 150* 177* 137*  BUN 22* 20 19  CREATININE 1.37* 1.18* 0.96  CALCIUM 8.0* 8.1* 8.2*  PHOS 1.9*  2.0* <1.0* 2.2*    Recent Labs Lab 05/30/2015 1933  05/14/15 0341 05/15/15 0516 05/16/15 0550  WBC 12.7*  --  28.5* 23.0* 29.6*  NEUTROABS 11.8*  --   --   --   --   HGB 10.2*  < > 10.0* 10.2* 10.5*  HCT 32.0*  < > 31.1* 31.8* 32.8*  MCV 82.3  --  81.0 81.7 81.4  PLT 176  --  190 148* 137*  < > = values in this interval not displayed.  Scheduled Meds: . antiseptic oral rinse  7 mL Mouth Rinse QID  . aztreonam  1 g Intravenous Q8H  . chlorhexidine gluconate  15 mL Mouth Rinse BID  . Chlorhexidine Gluconate Cloth  6 each Topical Q0600  . heparin  5,000 Units Subcutaneous 3 times per day  . insulin aspart  0-9 Units Subcutaneous 6 times per day  . ipratropium-albuterol  3 mL Nebulization Q6H  . levofloxacin (LEVAQUIN) IV  250 mg Intravenous Q24H  . mupirocin ointment  1 application Nasal BID  . pantoprazole (PROTONIX) IV  40 mg Intravenous QHS  . phosphorus  250 mg Oral TID  . sodium chloride  3 mL Intravenous Q12H  . vancomycin  750 mg Intravenous Q24H   Continuous Infusions: . feeding supplement (VITAL AF 1.2 CAL) 1,000 mL (05/16/15  1100)  . norepinephrine (LEVOPHED) Adult infusion Stopped (05/15/15 0546)  . dialysis replacement fluid (prismasate) 500 mL/hr at 05/16/15 0619  . dialysis replacement fluid (prismasate) 500 mL/hr at 05/16/15 0609  . dialysate (PRISMASATE) 1,000 mL/hr at 05/16/15 1123  . vasopressin (PITRESSIN) infusion - *FOR SHOCK* Stopped (05/15/15 0507)   PRN Meds:.sodium chloride, sodium chloride, albuterol, fentaNYL (SUBLIMAZE) injection, heparin, heparin, ondansetron (ZOFRAN) IV, sodium chloride, sodium chloride  ABG    Component Value Date/Time   PHART 7.422 05/16/2015 0300   PCO2ART 37.6 05/16/2015 0300   PO2ART 83.1 05/16/2015 0300   HCO3 24.0 05/16/2015 0300   TCO2 25.2 05/16/2015 0300   ACIDBASEDEF 1.7 05/15/2015 0345   O2SAT 96.5 05/16/2015 0300    A/P  1. Dialysis dependent AoCKD5 1. See note from 8/30 2. Plan for CRRT as short term attempt for stability 3. Not a candidate to transition to chronic HD 4. Tolerating fluid removal 5. Lytes ok today 6. Cont on current settings  7. Replace Phos as ordered, improved today 2. ?HCAP on ABX per PCCM 3. Shock improving off pressors 4. Diastolic HF 5. VDRF, COPD on chronic oxygen 6. Frailty  Sabra Heck, MD [facility]  pgr [phone]

## 2015-05-17 ENCOUNTER — Inpatient Hospital Stay (HOSPITAL_COMMUNITY): Payer: Medicare Other

## 2015-05-17 DIAGNOSIS — J9601 Acute respiratory failure with hypoxia: Secondary | ICD-10-CM

## 2015-05-17 LAB — BLOOD GAS, ARTERIAL
ACID-BASE EXCESS: 0.9 mmol/L (ref 0.0–2.0)
Bicarbonate: 24.5 mEq/L — ABNORMAL HIGH (ref 20.0–24.0)
Drawn by: 345601
FIO2: 0.4
O2 Saturation: 96.4 %
PATIENT TEMPERATURE: 98.6
PCO2 ART: 36 mmHg (ref 35.0–45.0)
PH ART: 7.448 (ref 7.350–7.450)
Pressure support: 18 cmH2O
TCO2: 25.7 mmol/L (ref 0–100)
pO2, Arterial: 81.1 mmHg (ref 80.0–100.0)

## 2015-05-17 LAB — CULTURE, RESPIRATORY

## 2015-05-17 LAB — RENAL FUNCTION PANEL
Albumin: 1.7 g/dL — ABNORMAL LOW (ref 3.5–5.0)
Anion gap: 9 (ref 5–15)
BUN: 21 mg/dL — ABNORMAL HIGH (ref 6–20)
CHLORIDE: 100 mmol/L — AB (ref 101–111)
CO2: 25 mmol/L (ref 22–32)
CREATININE: 0.86 mg/dL (ref 0.44–1.00)
Calcium: 7.7 mg/dL — ABNORMAL LOW (ref 8.9–10.3)
GFR calc non Af Amer: 60 mL/min — ABNORMAL LOW (ref >60)
Glucose, Bld: 130 mg/dL — ABNORMAL HIGH (ref 65–99)
POTASSIUM: 4.1 mmol/L (ref 3.5–5.1)
Phosphorus: 2.8 mg/dL (ref 2.5–4.6)
Sodium: 134 mmol/L — ABNORMAL LOW (ref 135–145)

## 2015-05-17 LAB — CBC
HCT: 33.2 % — ABNORMAL LOW (ref 36.0–46.0)
Hemoglobin: 10.4 g/dL — ABNORMAL LOW (ref 12.0–15.0)
MCH: 25.9 pg — AB (ref 26.0–34.0)
MCHC: 31.3 g/dL (ref 30.0–36.0)
MCV: 82.8 fL (ref 78.0–100.0)
PLATELETS: 117 10*3/uL — AB (ref 150–400)
RBC: 4.01 MIL/uL (ref 3.87–5.11)
RDW: 17 % — ABNORMAL HIGH (ref 11.5–15.5)
WBC: 34.8 10*3/uL — ABNORMAL HIGH (ref 4.0–10.5)

## 2015-05-17 LAB — BASIC METABOLIC PANEL
ANION GAP: 9 (ref 5–15)
BUN: 20 mg/dL (ref 6–20)
CHLORIDE: 101 mmol/L (ref 101–111)
CO2: 26 mmol/L (ref 22–32)
Calcium: 8 mg/dL — ABNORMAL LOW (ref 8.9–10.3)
Creatinine, Ser: 0.9 mg/dL (ref 0.44–1.00)
GFR calc non Af Amer: 57 mL/min — ABNORMAL LOW (ref >60)
Glucose, Bld: 127 mg/dL — ABNORMAL HIGH (ref 65–99)
Potassium: 4 mmol/L (ref 3.5–5.1)
Sodium: 136 mmol/L (ref 135–145)

## 2015-05-17 LAB — GLUCOSE, CAPILLARY
GLUCOSE-CAPILLARY: 145 mg/dL — AB (ref 65–99)
GLUCOSE-CAPILLARY: 108 mg/dL — AB (ref 65–99)
GLUCOSE-CAPILLARY: 100 mg/dL — AB (ref 65–99)
GLUCOSE-CAPILLARY: 113 mg/dL — AB (ref 65–99)
Glucose-Capillary: 135 mg/dL — ABNORMAL HIGH (ref 65–99)

## 2015-05-17 LAB — PHOSPHORUS: Phosphorus: 1.5 mg/dL — ABNORMAL LOW (ref 2.5–4.6)

## 2015-05-17 LAB — MAGNESIUM: Magnesium: 2.4 mg/dL (ref 1.7–2.4)

## 2015-05-17 LAB — CULTURE, RESPIRATORY W GRAM STAIN

## 2015-05-17 MED ORDER — DEXTROSE 5 % IV SOLN
30.0000 mmol | Freq: Once | INTRAVENOUS | Status: AC
  Administered 2015-05-17: 30 mmol via INTRAVENOUS
  Filled 2015-05-17: qty 10

## 2015-05-17 NOTE — Consult Note (Signed)
Consultation Note Date: 05/17/2015   Patient Name: [name]  DOB: March 23, 1928  MRN: [id number]  Age / Sex: 81 y.o., female   PCP: Renford Dills, MD Referring Physician: Nelda Bucks, MD  Reason for Consultation: Establishing goals of care, Psychosocial/spiritual support, Terminal care and Withdrawal of life-sustaining treatment  Palliative Care Assessment and Plan Summary of Established Goals of Care and Medical Treatment Preferences   Clinical Assessment/Narrative: Patient is an 80 year old female admitted from [facility] nursing home on 8/29 with altered mental status. She was intubated in the emergency room and found to be in renal failure with pulmonary edema. She has been undergoing CRRT for renal failure and her renal function has improved. Patient however is not a candidate for hemodialysis. She is breathing somewhat on her own, but requiring a lot of ventilator support. She is no longer on pressors, and requiring minimal sedation. Patient has leukocytosis, white blood cells 34.8 on 05/17/2015. Blood cultures have come back positive for gram positive rods. At this point, with all of her medical comorbidities critical care medicine is speaking with family about terminal extubation. Patient's spokesperson at this point has been her brother. Her son has been out of town on a cruise and the earliest he can get back is late night on 2015/06/02. Patient herself will open her eyes to voice attempts to help. Attempts to help staff with turning. She is getting fentanyl as needed for either pain or shortness of breath. She is averaging 2-3 when necessary doses daily. I have spoken to her brother Mr. Dutch Quint, he has been struggling with some of the decisions to start CRRT, intubation.   Contacts/Participants in Discussion: Primary Decision Maker: Patient's son, Cecelia Byars primary spokesperson when he returns. Her brother Jinny Sanders, has been acting as Runner, broadcasting/film/video until patient's  son is back . Mr. Dutch Quint and Mr. Jeananne Rama have been in contact via phone about the decisions that have been made.   HCPOA: yes  Palliative medicine team to stay in contact with Mr. Dutch Quint to arrange family meeting on Monday morning to address terminal extubation   Code Status/Advance Care Planning:  Partial code. Goal is to maintain intubation until son returns and likely pursue one way extubation on 05/19/2015. Patient is breathing on her own, but requiring a lot of ventilator support. Impression is that this will be a terminal wean.  Continue other life support measures as recommended by critical care medicine, until son returns and family meeting held Monday morning.  Symptom Management:   Dyspnea: Continue with fentanyl IV 50-100 g every 2 hours as needed  Pain: See above   Additional Recommendations (Limitations, Scope, Preferences):  n/a Psycho-social/Spiritual:   Support System: Son and brother  Desire for further Chaplaincy support:no  Prognosis: Hours - Days after extubation  Discharge Planning:  TBD after extubation       Chief Complaint/History of Present Illness: Patient is an 81 year old female admitted from skilled nursing facility with altered mental status. She was in respiratory distress and required intubation in the emergency room. She was also found to be in renal failure with pulmonary edema.  Primary Diagnoses  Present on Admission:  . Acute kidney injury  Palliative Review of Systems: Patient is intubated I have reviewed the medical record, interviewed the patient and family, and examined the patient. The following aspects are pertinent.  Past Medical History  Diagnosis Date  . COPD (chronic obstructive pulmonary disease)   . Diastolic congestive heart failure   . C.  difficile colitis   . Hypertension   . Hyperlipidemia   . CAD (coronary artery disease)   . Anemia   . Type 2 diabetes mellitus   . Pacemaker 2002  . Cardiac arrest - asystole  2002  . Gallstones   . Pulmonary hypertension   . Renal insufficiency   . Osteoarthritis   . Allergic rhinitis   . Gout   . Stasis dermatitis   . Sinoatrial node dysfunction   . Syncope   . Postsurgical percutaneous transluminal coronary angioplasty status   . Dyslipidemia   . Acute bronchitis   . History of cholelithiasis   . Obesity    Social History   Social History  . Marital Status: Widowed    Spouse Name: N/A  . Number of Children: 1  . Years of Education: N/A   Occupational History  . Retired     Hospital doctor  .     Social History Main Topics  . Smoking status: Former Smoker -- 2.00 packs/day for 25 years    Types: Cigarettes    Quit date: 09/13/1985  . Smokeless tobacco: Never Used  . Alcohol Use: No  . Drug Use: No  . Sexual Activity: Not Asked   Other Topics Concern  . None   Social History Narrative   Resides at [facility]   Family History  Problem Relation Age of Onset  . Diabetes Mother   . Hypertension Mother   . Heart disease Father    Scheduled Meds: . antiseptic oral rinse  7 mL Mouth Rinse QID  . aztreonam  1 g Intravenous Q8H  . chlorhexidine gluconate  15 mL Mouth Rinse BID  . heparin  5,000 Units Subcutaneous 3 times per day  . insulin aspart  0-9 Units Subcutaneous 6 times per day  . ipratropium-albuterol  3 mL Nebulization Q6H  . levofloxacin (LEVAQUIN) IV  250 mg Intravenous Q24H  . mupirocin ointment  1 application Nasal BID  . pantoprazole (PROTONIX) IV  40 mg Intravenous QHS  . sodium chloride  3 mL Intravenous Q12H  . sodium phosphate  Dextrose 5% IVPB  30 mmol Intravenous Once  . vancomycin  750 mg Intravenous Q24H   Continuous Infusions: . feeding supplement (VITAL AF 1.2 CAL) 1,000 mL (05/17/15 1000)  . norepinephrine (LEVOPHED) Adult infusion Stopped (05/15/15 0546)  . dialysis replacement fluid (prismasate) 500 mL/hr at 05/17/15 0319  . dialysis replacement fluid (prismasate) 500 mL/hr at 05/17/15 0320  .  dialysate (PRISMASATE) 1,000 mL/hr at 05/17/15 0756  . vasopressin (PITRESSIN) infusion - *FOR SHOCK* Stopped (05/15/15 0507)   PRN Meds:.sodium chloride, sodium chloride, albuterol, fentaNYL (SUBLIMAZE) injection, heparin, heparin, ondansetron (ZOFRAN) IV, sodium chloride, sodium chloride Medications Prior to Admission:  Prior to Admission medications   Medication Sig Start Date End Date Taking? Authorizing Provider  acetaminophen (TYLENOL) 325 MG tablet Take 650 mg by mouth every 6 (six) hours as needed for mild pain, moderate pain, fever or headache.   Yes Historical Provider, MD  albuterol (PROVENTIL) (2.5 MG/3ML) 0.083% nebulizer solution Take 3 mLs (2.5 mg total) by nebulization every 4 (four) hours while awake. And every 4 hours as needed for wheezing 10/05/13  Yes Storm Frisk, MD  albuterol (PROVENTIL) (2.5 MG/3ML) 0.083% nebulizer solution Take 2.5 mg by nebulization every 4 (four) hours as needed for wheezing or shortness of breath.   Yes Historical Provider, MD  allopurinol (ZYLOPRIM) 100 MG tablet Take 100 mg by mouth 2 (two) times daily.  Yes Historical Provider, MD  aspirin 81 MG tablet Take 81 mg by mouth daily.     Yes Historical Provider, MD  atenolol (TENORMIN) 50 MG tablet Take 50 mg by mouth daily. Hold for SBP <110, HR < 60   Yes Historical Provider, MD  Cholecalciferol (VITAMIN D3) 2000 UNITS TABS Take 1 tablet by mouth daily.   Yes Historical Provider, MD  ferrous sulfate 325 (65 FE) MG tablet Take 325 mg by mouth daily.    Yes Historical Provider, MD  Fluticasone Furoate-Vilanterol (BREO ELLIPTA) 100-25 MCG/INH AEPB Inhale 1 puff into the lungs daily. 03/27/14  Yes Storm Frisk, MD  insulin aspart (NOVOLOG) 100 UNIT/ML injection Inject 0-20 Units into the skin 3 (three) times daily with meals. Patient taking differently: Inject 0-8 Units into the skin 2 (two) times daily. Sliding scale: 200-250=2 units 251-300=4 301-350=6 351-400=8 09/19/13  Yes William S Minor,  NP  lidocaine (LIDODERM) 5 % Place 1 patch onto the skin daily. Remove & Discard patch within 12 hours or as directed by MD   Yes Historical Provider, MD  LOVAZA 1 G capsule Take 1 tablet by mouth daily. *Do Not Crush**   Yes Historical Provider, MD  omeprazole (PRILOSEC) 20 MG capsule Take 20 mg by mouth daily. * Take on an Empty stomach*   Yes Historical Provider, MD  OXYGEN Inhale into the lungs. 3 lpm   Yes Historical Provider, MD  polyethylene glycol (MIRALAX / GLYCOLAX) packet Take 17 g by mouth every 3 (three) days.   Yes Historical Provider, MD  Polyvinyl Alcohol (LIQUID TEARS OP) Place 1 drop into both eyes 4 (four) times daily.    Yes Historical Provider, MD  PROCRIT 13244 UNIT/ML injection Inject 10,000 Units into the skin once a week. Every Friday.   Yes Historical Provider, MD  rosuvastatin (CRESTOR) 20 MG tablet Take 20 mg by mouth daily.     Yes Historical Provider, MD  senna (SENOKOT) 8.6 MG tablet Take 1 tablet by mouth daily.   Yes Historical Provider, MD  tiotropium (SPIRIVA) 18 MCG inhalation capsule Place 18 mcg into inhaler and inhale daily.     Yes Historical Provider, MD  torsemide (DEMADEX) 20 MG tablet Take 2 tablets (40 mg total) by mouth daily. 10/05/13  Yes Storm Frisk, MD   Allergies  Allergen Reactions  . Codeine     REACTION: unspecified  . Doxycycline   . Guaifenesin & Derivatives   . Penicillins     REACTION: unspecified   CBC:    Component Value Date/Time   WBC 34.8* 05/17/2015 0519   HGB 10.4* 05/17/2015 0519   HCT 33.2* 05/17/2015 0519   PLT 117* 05/17/2015 0519   MCV 82.8 05/17/2015 0519   NEUTROABS 11.8* 04/28/2015 1933   LYMPHSABS 0.4* 05/03/2015 1933   MONOABS 0.4 05/13/2015 1933   EOSABS 0.0 04/25/2015 1933   BASOSABS 0.0 05/02/2015 1933   Comprehensive Metabolic Panel:    Component Value Date/Time   NA 136 05/17/2015 0519   K 4.0 05/17/2015 0519   CL 101 05/17/2015 0519   CO2 26 05/17/2015 0519   BUN 20 05/17/2015 0519    CREATININE 0.90 05/17/2015 0519   GLUCOSE 127* 05/17/2015 0519   CALCIUM 8.0* 05/17/2015 0519   CALCIUM 9.5 05/21/2011 0956   AST 66* 05/11/2015 2338   ALT 17 04/22/2015 2338   ALKPHOS 111 05/03/2015 2338   BILITOT 0.6 04/17/2015 2338   PROT 5.8* 04/24/2015 2338   ALBUMIN 1.7* 05/16/2015 1550  Physical Exam: Vital Signs: BP 129/78 mmHg  Pulse 104  Temp(Src) 97.6 F (36.4 C) (Oral)  Resp 31  Ht 4\' 11"  (1.499 m)  Wt 53.5 kg (117 lb 15.1 oz)  BMI 23.81 kg/m2  SpO2 100% SpO2: SpO2: 100 % O2 Device: O2 Device: Ventilator O2 Flow Rate: O2 Flow Rate (L/min): 2 L/min Intake/output summary:  Intake/Output Summary (Last 24 hours) at 05/17/15 1017 Last data filed at 05/17/15 1014  Gross per 24 hour  Intake   1751 ml  Output   2895 ml  Net  -1144 ml   LBM: Last BM Date:  (flexiseal) Baseline Weight: Weight: 64.1 kg (141 lb 5 oz) Most recent weight: Weight: 53.5 kg (117 lb 15.1 oz)  Exam Findings:  General: Patient is a frail acutely ill appearing female in ICU. She is on the ventilator. Respiratory: Patient is ventilated requiring  of ventilator support ; wheezing         Palliative Performance Scale: 20%              Additional Data Reviewed: Recent Labs     05/16/15  0550  05/16/15  1550  05/17/15  0519  WBC  29.6*   --   34.8*  HGB  10.5*   --   10.4*  PLT  137*   --   117*  NA   --   138  136  BUN   --   21*  20  CREATININE   --   0.95  0.90     Time In: 0800 Time Out: 0915 Time Total: 75 minutes Greater than 50%  of this time was spent counseling and coordinating care related to the above assessment and plan.  Signed by: Irean Hong, NP  Irean Hong, NP  05/17/2015, 10:17 AM  Please contact Palliative Medicine Team phone at [phone] for questions and concerns.

## 2015-05-17 NOTE — Progress Notes (Signed)
PULMONARY / CRITICAL CARE MEDICINE   Name: [name] MRN: [id number] DOB: Sep 05, 1928    ADMISSION DATE:  04/21/2015 CONSULTATION DATE:  05/06/2015  REFERRING MD :  EDP  CHIEF COMPLAINT:  AMS  INITIAL PRESENTATION: 81 year old female from [place] presented to [facility] ED 8/29 with AMS. Recent complaints of respiratory distress and confusion. In ED was intubated for airway protection. Found to be in renal failure with pulmonary edema on CXR. PCCM to admit.   STUDIES:  8/29 CT head > no acute findings  SIGNIFICANT EVENTS: 8/26 > some worsening respiratory distress 8/29 > admitted to ICU, intubated  SUBJECTIVE: Looks uncomfortable.  Weaning pressors.  Still on CVVHD. Copious purulent secretions   VITAL SIGNS: Temp:  [97.5 F (36.4 C)-98.8 F (37.1 C)] 97.6 F (36.4 C) (09/03 0815) Pulse Rate:  [40-102] 102 (09/03 0930) Resp:  [18-33] 29 (09/03 0930) BP: (80-155)/(41-69) 96/43 mmHg (09/03 0930) SpO2:  [95 %-100 %] 99 % (09/03 0930) FiO2 (%):  [40 %] 40 % (09/03 0834) Weight:  [53.5 kg (117 lb 15.1 oz)] 53.5 kg (117 lb 15.1 oz) (09/03 0400) HEMODYNAMICS: CVP:  [6 mmHg-8 mmHg] 7 mmHg VENTILATOR SETTINGS: Vent Mode:  [-] PSV;CPAP FiO2 (%):  [40 %] 40 % PEEP:  [5 cmH20] 5 cmH20 Pressure Support:  [12 cmH20-18 cmH20] 18 cmH20 INTAKE / OUTPUT:  Intake/Output Summary (Last 24 hours) at 05/17/15 0943 Last data filed at 05/17/15 0900  Gross per 24 hour  Intake   1688 ml  Output   3054 ml  Net  -1366 ml   PHYSICAL EXAMINATION: General:  Elderly, frail, chronically ill appearing female in NAD Neuro:  Arousable and following command, RASS 0, uncomfortable appearing at times, follows commands intermittently, PERRL. HEENT:  [place]/AT, PERRL, EOM-I and no JVD noted. Cardiovascular: RRR, Nl S1/S2 and no MRG Lungs:  resps even non labored on vent, Coarse bilateral breath sounds Abdomen:  Soft, non-distended Musculoskeletal:  No acute deformity, +1 pitting edema to BLE Skin:  Grossly  intact  LABS:  CBC  Recent Labs Lab 05/15/15 0516 05/16/15 0550 05/17/15 0519  WBC 23.0* 29.6* 34.8*  HGB 10.2* 10.5* 10.4*  HCT 31.8* 32.8* 33.2*  PLT 148* 137* 117*   Coag's  Recent Labs Lab 04/15/2015 2338  INR 1.82*   BMET  Recent Labs Lab 05/16/15 0546 05/16/15 1550 05/17/15 0519  NA 135 138 136  K 3.8 3.6 4.0  CL 101 104 101  CO2 27 26 26   BUN 19 21* 20  CREATININE 0.96 0.95 0.90  GLUCOSE 137* 148* 127*   Electrolytes  Recent Labs Lab 05/15/15 0516  05/16/15 0546 05/16/15 1550 05/17/15 0519  CALCIUM 8.0*  < > 8.2* 7.9* 8.0*  MG 2.4  --  2.5*  --  2.4  PHOS 1.9*  2.0*  < > 2.2* 1.1* 1.5*  < > = values in this interval not displayed. Sepsis Markers  Recent Labs Lab 05/14/2015 1944 04/23/2015 2338 05/13/15 0802 05/14/15 0341 05/15/15 0516  LATICACIDVEN 1.99 1.9 2.1*  --   --   PROCALCITON  --  13.19  --  13.77 8.91   ABG  Recent Labs Lab 05/15/15 0345 05/16/15 0300 05/17/15 0345  PHART 7.434 7.422 7.448  PCO2ART 33.3* 37.6 36.0  PO2ART 72.0* 83.1 81.1   Liver Enzymes  Recent Labs Lab 04/19/2015 1933 05/09/2015 2338  05/15/15 1525 05/16/15 0546 05/16/15 1550  AST 58* 66*  --   --   --   --  ALT 18 17  --   --   --   --   ALKPHOS 142* 111  --   --   --   --   BILITOT 1.0 0.6  --   --   --   --   ALBUMIN 2.9* 2.2*  < > 1.8* 1.9* 1.7*  < > = values in this interval not displayed. Cardiac Enzymes  Recent Labs Lab 04/28/2015 2338  TROPONINI 0.07*   Glucose  Recent Labs Lab 05/16/15 0750 05/16/15 1119 05/16/15 1533 05/16/15 1938 05/17/15 0414 05/17/15 0815  GLUCAP 122* 122* 104* 120* 135* 145*   Imaging Dg Chest Port 1 View  05/17/2015   CLINICAL DATA:  Endotracheal tube placement.  EXAM: PORTABLE CHEST - 1 VIEW  COMPARISON:  05/16/2015  FINDINGS: Endotracheal tube is 1.8 cm above the carina. A nasogastric tube enters the stomach.  Dual lead pacer, lead position unchanged.  Right internal jugular catheter tip:  SVC.  Left  internal jugular catheter crosses the midline and curve cephalad, likely terminating against the lateral margin of the SVC in the vicinity of the brachiocephalic confluence, similar to prior exam.  Upper zone pulmonary vascular indistinctness and interstitial accentuation bilaterally with airway thickening. Slightly improved aeration at the right lung base. Continued airspace opacity at the left lung base with suspected left pleural effusion. Mild enlargement of the cardiopericardial silhouette.  Atherosclerotic aortic arch.  IMPRESSION: 1. Endotracheal tube tip 1.8 cm above the carina. 2. The left IJ line tip continues to orient upwards against the right lateral margin of the SVC in the vicinity of the brachiocephalic confluence. This could conceivably predispose to fibrin sheath or clot formation along the tip, and repositioning might be considered. 3. Small left pleural effusion with left basilar airspace opacity and background generalized interstitial accentuation. Given the cardiomegaly this may well reflect interstitial pulmonary edema. Aeration at the right lung base appears mildly improved.   Electronically Signed   By: Gaylyn Rong M.D.   On: 05/17/2015 09:03   ASSESSMENT / PLAN:  PULMONARY OETT  8/29 >>> A: Acute on chronic hypercarbic respiratory failure COPD without acute exacerbation HCAP Pulmonary edema concerns  P:   Vent support - 8cc/kg  PS trials but no extubation given mental and fluid status. F/u CXR in AM. F/u ABG in AM. Volume removal as able with CVVH, -50 ml/hr. Follow CVP. Nebulized bronchodilators  CARDIOVASCULAR R IJ HD catheter 8/29 >>> A:  Shock, suspect cardiogenic. Less likely septic Acute on chronic diastolic CHF PAF (not on anticoagulation), currently paced H/o CAD, cardiac arrest Pacemaker status 2D echo 8/30>>>EF 60-65%, grade 3 diastolic dysfunction, severely dilated LA, mod TR P:  Telemetry monitoring MAP goal > 72mm/hg Follow  D/C levophed  and vasopressin. CVP monitoring but change to qshift.  RENAL A:   Acute on CKD Hyponatremia, mild Volume overload Renal u/s 8/30>>> neg for hydro P:   Follow BMET with hydration and BP support. Cont CRRT for now per renal - not candidate and does not want permanent HD. Renal following. Replace electrolytes as indicated.  GASTROINTESTINAL A:   No acute issues  P:   TF as per nutrition. PPI.  HEMATOLOGIC A:   Anemia of chronic illness  P:  Follow CBC Heparin sq for VTE ppx  INFECTIOUS A:   ? Septic shock, marginally meets SIRS. Source unclear. Favor HCAP ? UTI/hyrdonephrosis, is anuric presently P:   BCx2 8/29 >>>gram positive rod anaerobic UC 8/29 >>>NTD Sputum 8/29 >>>NTD  Aztreonam 8/29 >>>  Vancomycin 8/29 >>> Levaquin 8/31>>>  PCT 13.19 Follow WBC and fever curve WBC cont to climb.  ?other source.   ENDOCRINE A:   DM2   P:   CBG monitoring and SSI. Cortisol 60 no need for stress dose steroids.  NEUROLOGIC A:   Acute metabolic encephalopathy  P:   RASS goal: -1 PRN fentanyl. Avoid benzo given renal function.  FAMILY  - Updates: Spoke with son and brother extensively on 8/31, after discussion, decision is made to make patient LCB with no CPR/cardioversion.  Continue CRRT until Sunday, if improving will continue support and if not then will extubate with no intention to reintubate.   - Inter-disciplinary family meet or Palliative Care meeting due by:  9/5  The patient is critically ill with multiple organ systems failure and requires high complexity decision making for assessment and support, frequent evaluation and titration of therapies, application of advanced monitoring technologies and extensive interpretation of multiple databases.   Critical Care Time devoted to patient care services described in this note is  35  Minutes. This time reflects time of care of this signee Dr Koren Bound. This critical care time does not reflect procedure  time, or teaching time or supervisory time of PA/NP/Med student/Med Resident etc but could involve care discussion time.  Alyson Reedy, M.D. [facility] Pulmonary/Critical Care Medicine. Pager: [phone]. After hours pager: [phone].

## 2015-05-17 NOTE — Progress Notes (Signed)
CRITICAL VALUE ALERT  Critical value received:  Positive blood cultures for gram POSITIVE coccobacilli  Date of notification:  05/17/15  Time of notification:  1145  Critical value read back:Yes.    Nurse who received alert:  Tory Emerald, RN  MD notified (1st page):  Dr Molli Knock  Time of first page:  1146  MD notified (2nd page):  Time of second page:  Responding MD:  Dr Molli Knock  Time MD responded:  1146

## 2015-05-17 NOTE — Procedures (Signed)
Admit: 04/26/2015 LOS: 5  19F with shock, grade 3 dCHF, COPD, VDRF, anuric AoCKD5, chronic frailty  Current CRRT Prescription: Start Date: 05/13/2015 Catheter: R IJ BFR: 250 Pre Blood Pump: 500 4K DFR: 1000 4K Replacement Rate: 500 4K Goal UF: 50mL neg/hr Anticoagulation: None Clotting: seldom   S: No new events Tolerating CRRT Removing fluid Off pressors Weight down On PSV   O: 09/02 0701 - 09/03 0700 In: 1325 [I.V.:195; NG/GT:930; IV Piggyback:200] Out: 2741 [Stool:250]  Filed Weights   05/15/15 0500 05/16/15 0100 05/17/15 0400  Weight: 60.6 kg (133 lb 9.6 oz) 56.5 kg (124 lb 9 oz) 53.5 kg (117 lb 15.1 oz)     Recent Labs Lab 05/16/15 0546 05/16/15 1550 05/17/15 0519  NA 135 138 136  K 3.8 3.6 4.0  CL 101 104 101  CO2 27 26 26   GLUCOSE 137* 148* 127*  BUN 19 21* 20  CREATININE 0.96 0.95 0.90  CALCIUM 8.2* 7.9* 8.0*  PHOS 2.2* 1.1* 1.5*    Recent Labs Lab 05/02/2015 1933  05/15/15 0516 05/16/15 0550 05/17/15 0519  WBC 12.7*  < > 23.0* 29.6* 34.8*  NEUTROABS 11.8*  --   --   --   --   HGB 10.2*  < > 10.2* 10.5* 10.4*  HCT 32.0*  < > 31.8* 32.8* 33.2*  MCV 82.3  < > 81.7 81.4 82.8  PLT 176  < > 148* 137* 117*  < > = values in this interval not displayed.  Scheduled Meds: . antiseptic oral rinse  7 mL Mouth Rinse QID  . aztreonam  1 g Intravenous Q8H  . chlorhexidine gluconate  15 mL Mouth Rinse BID  . Chlorhexidine Gluconate Cloth  6 each Topical Q0600  . heparin  5,000 Units Subcutaneous 3 times per day  . insulin aspart  0-9 Units Subcutaneous 6 times per day  . ipratropium-albuterol  3 mL Nebulization Q6H  . levofloxacin (LEVAQUIN) IV  250 mg Intravenous Q24H  . mupirocin ointment  1 application Nasal BID  . pantoprazole (PROTONIX) IV  40 mg Intravenous QHS  . sodium chloride  3 mL Intravenous Q12H  . sodium phosphate  Dextrose 5% IVPB  30 mmol Intravenous Once  . vancomycin  750 mg Intravenous Q24H   Continuous Infusions: . feeding  supplement (VITAL AF 1.2 CAL) 1,000 mL (05/16/15 1900)  . norepinephrine (LEVOPHED) Adult infusion Stopped (05/15/15 0546)  . dialysis replacement fluid (prismasate) 500 mL/hr at 05/17/15 0319  . dialysis replacement fluid (prismasate) 500 mL/hr at 05/17/15 0320  . dialysate (PRISMASATE) 1,000 mL/hr at 05/17/15 0304  . vasopressin (PITRESSIN) infusion - *FOR SHOCK* Stopped (05/15/15 0507)   PRN Meds:.sodium chloride, sodium chloride, albuterol, fentaNYL (SUBLIMAZE) injection, heparin, heparin, ondansetron (ZOFRAN) IV, sodium chloride, sodium chloride  ABG    Component Value Date/Time   PHART 7.448 05/17/2015 0345   PCO2ART 36.0 05/17/2015 0345   PO2ART 81.1 05/17/2015 0345   HCO3 24.5* 05/17/2015 0345   TCO2 25.7 05/17/2015 0345   ACIDBASEDEF 1.7 05/15/2015 0345   O2SAT 96.4 05/17/2015 0345    A/P  1. Dialysis dependent AoCKD5 1. See note from 8/30 2. Plan was  for CRRT as short term attempt for stability 3. Not a candidate to transition to chronic HD 4. Tolerating fluid removal 5. Lytes ok today 6. Cont on current settings  7. Replace Phos again today 8. Anticipate discussion with son, coming from out of town on 9/4 re Goals of Care 2. ?HCAP on ABX per PCCM --  Vanc / Aztreonam, / levoflox 3. Shock improved off pressors 4. Diastolic HF 5. VDRF, COPD on chronic oxygen on PSV, ? extubation 6. Frailty  Sabra Heck, MD [facility] pgr [phone]

## 2015-05-18 ENCOUNTER — Inpatient Hospital Stay (HOSPITAL_COMMUNITY): Payer: Medicare Other

## 2015-05-18 LAB — BASIC METABOLIC PANEL
Anion gap: 10 (ref 5–15)
BUN: 23 mg/dL — AB (ref 6–20)
CHLORIDE: 100 mmol/L — AB (ref 101–111)
CO2: 25 mmol/L (ref 22–32)
CREATININE: 0.92 mg/dL (ref 0.44–1.00)
Calcium: 8.2 mg/dL — ABNORMAL LOW (ref 8.9–10.3)
GFR calc Af Amer: >60 mL/min (ref >60)
GFR calc non Af Amer: 55 mL/min — ABNORMAL LOW (ref >60)
Glucose, Bld: 161 mg/dL — ABNORMAL HIGH (ref 65–99)
POTASSIUM: 4.2 mmol/L (ref 3.5–5.1)
SODIUM: 135 mmol/L (ref 135–145)

## 2015-05-18 LAB — BLOOD GAS, ARTERIAL
ACID-BASE DEFICIT: 0.8 mmol/L (ref 0.0–2.0)
Bicarbonate: 22.4 mEq/L (ref 20.0–24.0)
Drawn by: 419771
FIO2: 0.4
MODE: POSITIVE
O2 SAT: 97.6 %
PATIENT TEMPERATURE: 98.6
PCO2 ART: 31.5 mmHg — AB (ref 35.0–45.0)
PEEP/CPAP: 5 cmH2O
PH ART: 7.466 — AB (ref 7.350–7.450)
PO2 ART: 101 mmHg — AB (ref 80.0–100.0)
Pressure support: 8 cmH2O
TCO2: 23.4 mmol/L (ref 0–100)

## 2015-05-18 LAB — POCT I-STAT 3, ART BLOOD GAS (G3+)
Acid-base deficit: 18 mmol/L — ABNORMAL HIGH (ref 0.0–2.0)
Bicarbonate: 14.6 mEq/L — ABNORMAL LOW (ref 20.0–24.0)
O2 Saturation: 84 %
PH ART: 6.973 — AB (ref 7.350–7.450)
TCO2: 17 mmol/L (ref 0–100)
pCO2 arterial: 63.1 mmHg (ref 35.0–45.0)
pO2, Arterial: 75 mmHg — ABNORMAL LOW (ref 80.0–100.0)

## 2015-05-18 LAB — RENAL FUNCTION PANEL
ANION GAP: 17 — AB (ref 5–15)
Albumin: 1.8 g/dL — ABNORMAL LOW (ref 3.5–5.0)
BUN: 17 mg/dL (ref 6–20)
CHLORIDE: 100 mmol/L — AB (ref 101–111)
CO2: 18 mmol/L — ABNORMAL LOW (ref 22–32)
Calcium: 8.2 mg/dL — ABNORMAL LOW (ref 8.9–10.3)
Creatinine, Ser: 0.79 mg/dL (ref 0.44–1.00)
Glucose, Bld: 176 mg/dL — ABNORMAL HIGH (ref 65–99)
POTASSIUM: 5.3 mmol/L — AB (ref 3.5–5.1)
Phosphorus: 4.6 mg/dL (ref 2.5–4.6)
Sodium: 135 mmol/L (ref 135–145)

## 2015-05-18 LAB — GLUCOSE, CAPILLARY
GLUCOSE-CAPILLARY: 114 mg/dL — AB (ref 65–99)
GLUCOSE-CAPILLARY: 129 mg/dL — AB (ref 65–99)
GLUCOSE-CAPILLARY: 168 mg/dL — AB (ref 65–99)
GLUCOSE-CAPILLARY: 32 mg/dL — AB (ref 65–99)
Glucose-Capillary: 101 mg/dL — ABNORMAL HIGH (ref 65–99)
Glucose-Capillary: 120 mg/dL — ABNORMAL HIGH (ref 65–99)
Glucose-Capillary: 145 mg/dL — ABNORMAL HIGH (ref 65–99)
Glucose-Capillary: 69 mg/dL (ref 65–99)
Glucose-Capillary: 76 mg/dL (ref 65–99)

## 2015-05-18 LAB — VANCOMYCIN, TROUGH: Vancomycin Tr: 15 ug/mL (ref 10.0–20.0)

## 2015-05-18 LAB — PHOSPHORUS: PHOSPHORUS: 2.7 mg/dL (ref 2.5–4.6)

## 2015-05-18 LAB — CBC
HEMATOCRIT: 34.4 % — AB (ref 36.0–46.0)
HEMOGLOBIN: 11 g/dL — AB (ref 12.0–15.0)
MCH: 26.1 pg (ref 26.0–34.0)
MCHC: 32 g/dL (ref 30.0–36.0)
MCV: 81.5 fL (ref 78.0–100.0)
Platelets: 81 10*3/uL — ABNORMAL LOW (ref 150–400)
RBC: 4.22 MIL/uL (ref 3.87–5.11)
RDW: 17 % — ABNORMAL HIGH (ref 11.5–15.5)
WBC: 33.3 10*3/uL — ABNORMAL HIGH (ref 4.0–10.5)

## 2015-05-18 LAB — MAGNESIUM: Magnesium: 2.4 mg/dL (ref 1.7–2.4)

## 2015-05-18 MED ORDER — SODIUM BICARBONATE 8.4 % IV SOLN
INTRAVENOUS | Status: AC
  Administered 2015-05-18: 200 meq via INTRAVENOUS
  Filled 2015-05-18: qty 200

## 2015-05-18 MED ORDER — DEXTROSE 50 % IV SOLN
25.0000 g | Freq: Once | INTRAVENOUS | Status: AC
  Administered 2015-05-18: 25 g via INTRAVENOUS

## 2015-05-18 MED ORDER — SODIUM BICARBONATE 8.4 % IV SOLN
200.0000 meq | Freq: Once | INTRAVENOUS | Status: AC
  Administered 2015-05-18: 200 meq via INTRAVENOUS

## 2015-05-18 MED ORDER — DEXTROSE 50 % IV SOLN
INTRAVENOUS | Status: AC
  Filled 2015-05-18: qty 50

## 2015-05-22 ENCOUNTER — Telehealth: Payer: Self-pay

## 2015-05-22 LAB — CULTURE, BLOOD (ROUTINE X 2)

## 2015-05-22 NOTE — Telephone Encounter (Signed)
On 05/22/2015 I received a death certificate from [facility]. The death certificate is for burial. The patient is a patient of Doctor Tyson Alias. The death certificate will be taken to [facility]) this afternoon for signature. On 06/12/2015 I received the death certificate back from Doctor Tyson Alias. I got the death certificate ready for pickup and called the funeral home to let them know the death certificate was ready for pickup.

## 2015-06-14 NOTE — Progress Notes (Signed)
RT called to patients room by RN due to patient not doing well on vent. RT ask to switch patient to [facility] due to patient actively dying, for the family which is at the bedside. RT switched patient.

## 2015-06-14 NOTE — Progress Notes (Signed)
Called and updated pts brother Mr [name] about pts change in condition. Mr [name] updated and support given over the phone. Mr [name] stated he would come to the hospital.

## 2015-06-14 NOTE — Discharge Summary (Addendum)
[name], [name]                ACCOUNT NO.:  [id number]  MEDICAL RECORD NO.:  [id number]  LOCATION:  2M07C                        FACILITY:  [facility]  PHYSICIAN:  Felipa Evener, MD  DATE OF BIRTH:  1927-09-15  DATE OF ADMISSION:  2015-05-26 DATE OF DISCHARGE:  06/13/2015                              DISCHARGE SUMMARY   DEATH SUMMARY  PRIMARY DIAGNOSIS/CAUSE OF DEATH:  Acute hypoxemic respiratory failure.  SECONDARY DIAGNOSES: 1. Healthcare-associated pneumonia. 2. Chronic obstructive pulmonary disease. 3. Cardiogenic shock. 4. Paroxysmal atrial fibrillation. 5. Acute on chronic kidney injury. 6. Type 2 diabetes. 7. Acute metabolic encephalopathy. 8. Septic shock, POA.  HOSPITAL COURSE:  The patient is an 81 year old female with extensive past medical history who presented to the hospital with acute mental status changes, was noted to have pneumonia, developed acute respiratory failure.  The patient was intubated per family's wishes; however, her metabolic acidosis and kidney failure continued to deteriorate.  The patient was started on CRT with failure to improve.  Dr. Sandi Carne spoke with the patient's family at 9:30 p.m. on September 4th.  As per previous discussion, the patient was not to undergo any CPR.  Dr. Belinda Block had discussion with the family who also confirmed no defibrillation.  Magnet was placed on the pacemaker defibrillator to prevent defibrillation.  The patient continued to deteriorate and suffered a cardiac arrest.  No resuscitation efforts were to take place after family discussion, and the patient was pronounced dead and family was notified.     Felipa Evener, MD     WJY/MEDQ  D:  05/30/2015  T:  05/30/2015  Job:  [id number]

## 2015-06-14 NOTE — Progress Notes (Signed)
Pt with worsening metabolic acidosis blood pressures despite maximal support with pressors and CRRT.  Discussed with son (grandson) that chest compressions or other intervention would be futile and only potentially cause suffering.  A magnet was placed over the pace-maker defibrillator to prevent defibrillation.  [name] died at 8:42PM.  Her grandson was at her bedside.

## 2015-06-14 NOTE — Progress Notes (Signed)
Pts son, Asher Muir, arrived from being out of the country around 2000 tonight. Asher Muir was updated at the bedside by nursing staff about his mothers condition.   Pt has continued to deteriorate rapidly over the last 30 minutes despite bicarb pushes and increasing in Levophed support.   Nursing staff has actively been on the phone with the [facility] MD about pts status.   Emotional support given to family at the bedside. Chaplain paged to bedside.  CCM fellow at the bedside, pt actively dying. Support given to family. Will continue to monitor and give support.

## 2015-06-14 NOTE — Progress Notes (Signed)
Pt BP have been trending down with MAP being consistently in low 50's. Unable to pull any fluid with CRRT and having to keep pt even. Dr Molli Knock on the floor and made aware. Order received to restart Levophed. Will continue to monitor closely.

## 2015-06-14 NOTE — Progress Notes (Signed)
PULMONARY / CRITICAL CARE MEDICINE   Name: [name] MRN: [id number] DOB: 09-13-1928    ADMISSION DATE:  2015/06/01 CONSULTATION DATE:  06-01-15  REFERRING MD :  EDP  CHIEF COMPLAINT:  AMS  INITIAL PRESENTATION: 81 year old female from [place] presented to [facility] ED 8/29 with AMS. Recent complaints of respiratory distress and confusion. In ED was intubated for airway protection. Found to be in renal failure with pulmonary edema on CXR. PCCM to admit.   STUDIES:  8/29 CT head > no acute findings  SIGNIFICANT EVENTS: 8/26 > some worsening respiratory distress 8/29 > admitted to ICU, intubated  SUBJECTIVE: Looks uncomfortable.  Weaning pressors.  Still on CVVHD. Copious purulent secretions   VITAL SIGNS: Temp:  [97.3 F (36.3 C)-97.6 F (36.4 C)] 97.5 F (36.4 C) (09/04 0800) Pulse Rate:  [25-108] 90 (09/04 0915) Resp:  [19-35] 27 (09/04 0930) BP: (81-129)/(45-101) 85/47 mmHg (09/04 0930) SpO2:  [92 %-100 %] 100 % (09/04 0915) FiO2 (%):  [40 %] 40 % (09/04 0807) Weight:  [52.2 kg (115 lb 1.3 oz)] 52.2 kg (115 lb 1.3 oz) (09/04 0400) HEMODYNAMICS:   VENTILATOR SETTINGS: Vent Mode:  [-] PSV;CPAP FiO2 (%):  [40 %] 40 % PEEP:  [5 cmH20] 5 cmH20 Pressure Support:  [8 cmH20] 8 cmH20 INTAKE / OUTPUT:  Intake/Output Summary (Last 24 hours) at 05/16/2015 0948 Last data filed at 06/05/2015 0910  Gross per 24 hour  Intake   1940 ml  Output   3025 ml  Net  -1085 ml   PHYSICAL EXAMINATION: General:  Elderly, frail, chronically ill appearing female in NAD Neuro:  Arousable and following command, RASS 0, uncomfortable appearing at times, follows commands intermittently, PERRL. HEENT:  Centre/AT, PERRL, EOM-I and no JVD noted. Cardiovascular: RRR, Nl S1/S2 and no MRG Lungs:  resps even non labored on vent, Coarse bilateral breath sounds Abdomen:  Soft, non-distended Musculoskeletal:  No acute deformity, +1 pitting edema to BLE Skin:  Grossly intact  LABS:  CBC  Recent  Labs Lab 05/16/15 0550 05/17/15 0519 06/10/2015 0540  WBC 29.6* 34.8* 33.3*  HGB 10.5* 10.4* 11.0*  HCT 32.8* 33.2* 34.4*  PLT 137* 117* 81*   Coag's  Recent Labs Lab 06-01-15 2338  INR 1.82*   BMET  Recent Labs Lab 05/17/15 0519 05/17/15 1616 06/08/2015 0540  NA 136 134* 135  K 4.0 4.1 4.2  CL 101 100* 100*  CO2 BUN 20 21* 23*  CREATININE 0.90 0.86 0.92  GLUCOSE 127* 130* 161*   Electrolytes  Recent Labs Lab 05/16/15 0546  05/17/15 0519 05/17/15 1616 06/12/2015 0540  CALCIUM 8.2*  < > 8.0* 7.7* 8.2*  MG 2.5*  --  2.4  --  2.4  PHOS 2.2*  < > 1.5* 2.8 2.7  < > = values in this interval not displayed. Sepsis Markers  Recent Labs Lab 06/01/15 1944 01-Jun-2015 2338 05/13/15 0802 05/14/15 0341 05/15/15 0516  LATICACIDVEN 1.99 1.9 2.1*  --   --   PROCALCITON  --  13.19  --  13.77 8.91   ABG  Recent Labs Lab 05/16/15 0300 05/17/15 0345 06/12/2015 0420  PHART 7.422 7.448 7.466*  PCO2ART 37.6 36.0 31.5*  PO2ART 83.1 81.1 101*   Liver Enzymes  Recent Labs Lab 06-01-15 1933 01-Jun-2015 2338  05/16/15 0546 05/16/15 1550 05/17/15 1616  AST 58* 66*  --   --   --   --   ALT 18 17  --   --   --   --  ALKPHOS 142* 111  --   --   --   --   BILITOT 1.0 0.6  --   --   --   --   ALBUMIN 2.9* 2.2*  < > 1.9* 1.7* 1.7*  < > = values in this interval not displayed. Cardiac Enzymes  Recent Labs Lab 04/20/2015 2338  TROPONINI 0.07*   Glucose  Recent Labs Lab 05/17/15 1625 05/17/15 1948 05/17/15 2341 June 08, 2015 0355 2015-06-08 0412 2015-06-08 0751  GLUCAP 100* 113* 120* 69 145* 114*   Imaging Dg Chest Port 1 View  2015-06-08   CLINICAL DATA:  Acute respiratory failure and cardiogenic shock.  EXAM: PORTABLE CHEST - 1 VIEW  COMPARISON:  05/17/2015 and prior exams  FINDINGS: An endotracheal tube with tip 3.3 cm above the carina, left IJ central venous catheter with tip extending cephalad into the right brachiocephalic vein, NG tube entering the stomach  with tip off field of 2 and right IJ central venous catheter with tip overlying the mid SVC again noted.  Bibasilar opacities/atelectasis again noted, slightly improved on the left.  There is no evidence of pneumothorax.  Little interval change noted.  IMPRESSION: Slightly improved left basilar aeration, otherwise unchanged exam.   Electronically Signed   By: Harmon Pier M.D.   On: 06-08-15 07:35   ASSESSMENT / PLAN:  PULMONARY OETT  8/29 >>> A: Acute on chronic hypercarbic respiratory failure COPD without acute exacerbation HCAP Pulmonary edema concerns  P:   Vent support - 8cc/kg  PS trials but no extubation until son gets here then one way extubation. Anticipate extubation in AM. F/u CXR in AM. F/u ABG in AM. Volume removal as able with CVVH, -50 ml/hr. Follow CVP. Nebulized bronchodilators.  CARDIOVASCULAR R IJ HD catheter 8/29 >>> A:  Shock, suspect cardiogenic. Less likely septic Acute on chronic diastolic CHF PAF (not on anticoagulation), currently paced H/o CAD, cardiac arrest Pacemaker status 2D echo 8/30>>>EF 60-65%, grade 3 diastolic dysfunction, severely dilated LA, mod TR P:  Telemetry monitoring MAP goal > 11mm/hg Follow  D/C levophed and vasopressin. CVP monitoring q shift.  RENAL A:   Acute on CKD Hyponatremia, mild Volume overload Renal u/s 8/30>>> neg for hydro P:   Follow BMET with hydration and BP support. Cont CRRT for now per renal - not candidate and does not want permanent HD. Renal following. Replace electrolytes as indicated.  GASTROINTESTINAL A:   No acute issues  P:   TF as per nutrition. PPI.  HEMATOLOGIC A:   Anemia of chronic illness  P:  Follow CBC Heparin sq for VTE ppx  INFECTIOUS A:   ? Septic shock, marginally meets SIRS. Source unclear. Favor HCAP ? UTI/hyrdonephrosis, is anuric presently P:   BCx2 8/29 >>>gram positive rod anaerobic UC 8/29 >>>NTD Sputum 8/29 >>>NTD  Aztreonam 8/29 >>> Vancomycin 8/29  >>> Levaquin 8/31>>>  PCT 13.19 Follow WBC and fever curve WBC cont to climb.  ?other source.   ENDOCRINE A:   DM2   P:   CBG monitoring and SSI. Cortisol 60 no need for stress dose steroids.  NEUROLOGIC A:   Acute metabolic encephalopathy  P:   RASS goal: -1 PRN fentanyl. Avoid benzo given renal function.  FAMILY  - Updates: Spoke with son and brother extensively on 8/31, after discussion, decision is made to make patient LCB with no CPR/cardioversion.  Continue CRRT until Sunday, if improving will continue support and if not then will extubate with no intention to reintubate.   -  Inter-disciplinary family meet or Palliative Care meeting due by:  9/5  The patient is critically ill with multiple organ systems failure and requires high complexity decision making for assessment and support, frequent evaluation and titration of therapies, application of advanced monitoring technologies and extensive interpretation of multiple databases.   Critical Care Time devoted to patient care services described in this note is  35  Minutes. This time reflects time of care of this signee Dr Koren Bound. This critical care time does not reflect procedure time, or teaching time or supervisory time of PA/NP/Med student/Med Resident etc but could involve care discussion time.  Alyson Reedy, M.D. [facility] Pulmonary/Critical Care Medicine. Pager: [phone]. After hours pager: [phone].

## 2015-06-14 NOTE — Progress Notes (Signed)
Paged on call Renal MD about rising K this afternoon of 5.3 at 1815 from this am K of 4.2. Waiting on return call. Will continue to monitor.

## 2015-06-14 NOTE — Progress Notes (Signed)
ANTIBIOTIC CONSULT NOTE - FOLLOW UP  Pharmacy Consult for Vancomycin and Aztreonam and Levaquin Indication: Sepsis  Allergies  Allergen Reactions  . Codeine     REACTION: unspecified  . Doxycycline   . Guaifenesin & Derivatives   . Penicillins     REACTION: unspecified    Patient Measurements: Height:  (149.9 cm) Weight: 115 lb 1.3 oz (52.2 kg) IBW/kg (Calculated) : 43.2  Vital Signs: Temp: 97.5 F (36.4 C) (09/04 0800) Temp Source: Oral (09/04 0800) BP: 86/47 mmHg (09/04 0900) Pulse Rate: 49 (09/04 0900) Intake/Output from previous day: 09/03 0701 - 09/04 0700 In: 1783 [I.V.:245; NG/GT:1080; IV Piggyback:458] Out: 2902  Intake/Output from this shift: Total I/O In: 310 [I.V.:20; NG/GT:90; IV Piggyback:200] Out: 429 [Other:329; Stool:100]  Labs:  Recent Labs  05/16/15 0550  05/17/15 0519 05/17/15 1616 05/17/2015 0540  WBC 29.6*  --  34.8*  --  33.3*  HGB 10.5*  --  10.4*  --  11.0*  PLT 137*  --  117*  --  81*  CREATININE  --   < > 0.90 0.86 0.92  < > = values in this interval not displayed. Estimated Creatinine Clearance: 33 mL/min (by C-G formula based on Cr of 0.92).  Recent Labs  05/26/2015 0539  [facility] 15     Microbiology: Recent Results (from the past 720 hour(s))  Culture, blood (routine x 2)     Status: None (Preliminary result)   Collection Time: June 11, 2015  7:30 PM  Result Value Ref Range Status   Specimen Description BLOOD RIGHT ANTECUBITAL  Final   Special Requests BOTTLES DRAWN AEROBIC AND ANAEROBIC 5CC  Final   Culture  Setup Time   Final    GRAM POSITIVE RODS ANAEROBIC BOTTLE ONLY CRITICAL RESULT CALLED TO, READ BACK BY AND VERIFIED WITH: Mirian Capuchin, RN AT 0857 ON [id number] BY Lucienne Capers    Culture   Final    Romie Minus POSITIVE RODS TOO YOUNG TO READ Performed at [facility]    Report Status PENDING  Incomplete  Urine culture     Status: None   Collection Time: 06-11-15  7:35 PM  Result Value Ref Range Status   Specimen  Description URINE, CATHETERIZED  Final   Special Requests NONE  Final   Culture   Final    NO GROWTH 1 DAY Performed at [facility]    Report Status 05/13/2015 FINAL  Final  Culture, blood (routine x 2)     Status: None (Preliminary result)   Collection Time: 11-Jun-2015  7:38 PM  Result Value Ref Range Status   Specimen Description BLOOD LEFT HAND  Final   Special Requests BOTTLES DRAWN AEROBIC AND ANAEROBIC 5CC  Final   Culture  Setup Time   Final    GRAM POSITIVE COCCOBACILLUS ANAEROBIC BOTTLE ONLY CRITICAL RESULT CALLED TO, READ BACK BY AND VERIFIED WITH: Mirian Capuchin 05/17/15 @ 1142 M VESTAL    Culture   Final    GRAM POSITIVE COCCOBACILLUS Performed at [facility]    Report Status PENDING  Incomplete  Culture, respiratory (tracheal aspirate)     Status: None   Collection Time: 05/13/15  1:21 AM  Result Value Ref Range Status   Specimen Description TRACHEAL ASPIRATE  Final   Special Requests NONE  Final   Gram Stain   Final    ABUNDANT WBC PRESENT, PREDOMINANTLY PMN NO SQUAMOUS EPITHELIAL CELLS SEEN ABUNDANT GRAM NEGATIVE RODS RARE GRAM POSITIVE COCCI IN PAIRS    Culture  Final    ABUNDANT HAEMOPHILUS INFLUENZAE Note: BETA LACTAMASE POSITIVE Performed at [facility]    Report Status 05/15/2015 FINAL  Final  MRSA PCR Screening     Status: Abnormal   Collection Time: 05/13/15  3:09 AM  Result Value Ref Range Status   MRSA by PCR POSITIVE (A) NEGATIVE Corrected    Comment: RESULT CALLED TO, READ BACK BY AND VERIFIED WITH: B.SHEPPARD @ [id number] 05/13/15 M.KELLY        The GeneXpert MRSA Assay (FDA approved for NASAL specimens only), is one component of a comprehensive MRSA colonization surveillance program. It is not intended to diagnose MRSA infection nor to guide or monitor treatment for MRSA infections. CORRECTED ON 08/30 AT 1191: PREVIOUSLY REPORTED AS RESISTANT        The GeneXpert MRSA Assay (FDA approved for NASAL specimens only), is one  component of a comprehensive MRSA colonization surveillance program. It is not intended to diagnose MRSA  infection nor to guide or monitor treatment for MRSA infections. RESULT CALLED TO, READ BACK BY AND VERIFIED WITH: B SHEPPARD@0433  05/13/15 MKELLY   Culture, respiratory (NON-Expectorated)     Status: None   Collection Time: 05/14/15 12:38 PM  Result Value Ref Range Status   Specimen Description TRACHEAL ASPIRATE  Final   Special Requests NONE  Final   Gram Stain   Final    MODERATE WBC PRESENT, PREDOMINANTLY PMN NO SQUAMOUS EPITHELIAL CELLS SEEN NO ORGANISMS SEEN Performed at [facility]    Culture   Final    RARE CANDIDA ALBICANS Performed at [facility]    Report Status 05/17/2015 FINAL  Final  C difficile quick scan w PCR reflex     Status: None   Collection Time: 05/16/15  4:31 AM  Result Value Ref Range Status   C Diff antigen NEGATIVE NEGATIVE Final   C Diff toxin NEGATIVE NEGATIVE Final   C Diff interpretation Negative for toxigenic C. difficile  Final    Medical History: Past Medical History  Diagnosis Date  . COPD (chronic obstructive pulmonary disease)   . Diastolic congestive heart failure   . C. difficile colitis   . Hypertension   . Hyperlipidemia   . CAD (coronary artery disease)   . Anemia   . Type 2 diabetes mellitus   . Pacemaker 2002  . Cardiac arrest - asystole 2002  . Gallstones   . Pulmonary hypertension   . Renal insufficiency   . Osteoarthritis   . Allergic rhinitis   . Gout   . Stasis dermatitis   . Sinoatrial node dysfunction   . Syncope   . Postsurgical percutaneous transluminal coronary angioplasty status   . Dyslipidemia   . Acute bronchitis   . History of cholelithiasis   . Obesity    Assessment: 46 [place] [facility] with AMS. Required intubation in ED. Currently on Vanc, levofloxacin and Aztreonam for r/o sepsis - unknown source. Currently also on CRRT. Plan is for family discussion today. Vanc  trough came back therapeutic this AM on CRRT.   Anti-infectives 8/29 Vancomycin  >> 8/29 Aztreonam >> 8/30 Levaquin>>  Cultures 8/29 bloodx2: Gram pos coccobacillus x1,  GPR x1 8/29 urine: ngtd 8/30 trach asp: ngtd  Goal of Therapy:  Vancomycin trough level 15-20 mcg/ml  Plan:   -Continue vancomycin to 750 mg IV q24h -Continue levofloxacin to 250 mg IV q24h -Consider dc aztreonam -Watch renal plans  Ulyses Southward, PharmD Pager: [phone] 06/02/2015 9:19 AM

## 2015-06-14 NOTE — Progress Notes (Signed)
Hypoglycemic Event  CBG: 32  Treatment: D50 IV 50 mL  Symptoms: None  Follow-up CBG: Time: 1707 CBG Result: 168  Possible Reasons for Event: Unknown  Comments/MD notified:     Dametra Whetsel, Pearla Dubonnet  Remember to initiate Hypoglycemia Order Set & complete

## 2015-06-14 NOTE — Progress Notes (Signed)
I received a call from the patient's brother, Vaughan Browner, to set up family meeting.  Chart review reveals plan for likely terminal extubation.  Family meeting with the patient's brother and son is set for 05/19/15 at 10:00am.  Romie Minus, MD [facility] Palliative Medicine Team [phone]

## 2015-06-14 NOTE — Procedures (Signed)
Admit: 04/30/2015 LOS: 6  89F with shock, grade 3 dCHF, COPD, VDRF, anuric AoCKD5, chronic frailty  Current CRRT Prescription: Start Date: 05/13/2015 Catheter: R IJ BFR: 250 Pre Blood Pump: 500 4K DFR: 1000 4K Replacement Rate: 500 4K Goal UF: 50mL neg/hr Anticoagulation: None Clotting: seldom   S: No new events Tolerating CRRT Removing fluid Off pressors Weight down On PSV   O: 09/03 0701 - 09/04 0700 In: 1728 [I.V.:235; NG/GT:1035; IV Piggyback:458] Out: 2793   Filed Weights   05/16/15 0100 05/17/15 0400 31-May-2015 0400  Weight: 56.5 kg (124 lb 9 oz) 53.5 kg (117 lb 15.1 oz) 52.2 kg (115 lb 1.3 oz)     Recent Labs Lab 05/17/15 0519 05/17/15 1616 2015/05/31 0540  NA 136 134* 135  K 4.0 4.1 4.2  CL 101 100* 100*  CO2 GLUCOSE 127* 130* 161*  BUN 20 21* 23*  CREATININE 0.90 0.86 0.92  CALCIUM 8.0* 7.7* 8.2*  PHOS 1.5* 2.8 2.7    Recent Labs Lab 05/10/2015 1933  05/16/15 0550 05/17/15 0519 May 31, 2015 0540  WBC 12.7*  < > 29.6* 34.8* 33.3*  NEUTROABS 11.8*  --   --   --   --   HGB 10.2*  < > 10.5* 10.4* 11.0*  HCT 32.0*  < > 32.8* 33.2* 34.4*  MCV 82.3  < > 81.4 82.8 81.5  PLT 176  < > 137* 117* 81*  < > = values in this interval not displayed.  Scheduled Meds: . antiseptic oral rinse  7 mL Mouth Rinse QID  . aztreonam  1 g Intravenous Q8H  . chlorhexidine gluconate  15 mL Mouth Rinse BID  . heparin  5,000 Units Subcutaneous 3 times per day  . insulin aspart  0-9 Units Subcutaneous 6 times per day  . ipratropium-albuterol  3 mL Nebulization Q6H  . levofloxacin (LEVAQUIN) IV  250 mg Intravenous Q24H  . pantoprazole (PROTONIX) IV  40 mg Intravenous QHS  . sodium chloride  3 mL Intravenous Q12H  . vancomycin  750 mg Intravenous Q24H   Continuous Infusions: . feeding supplement (VITAL AF 1.2 CAL) 1,000 mL (05/17/15 1900)  . norepinephrine (LEVOPHED) Adult infusion Stopped (05/15/15 0546)  . dialysis replacement fluid (prismasate) 500 mL/hr at  05/17/15 2323  . dialysis replacement fluid (prismasate) 500 mL/hr at 05/17/15 2323  . dialysate (PRISMASATE) 1,000 mL/hr at 31-May-2015 0439  . vasopressin (PITRESSIN) infusion - *FOR SHOCK* Stopped (05/15/15 0507)   PRN Meds:.sodium chloride, sodium chloride, albuterol, fentaNYL (SUBLIMAZE) injection, heparin, heparin, ondansetron (ZOFRAN) IV, sodium chloride, sodium chloride  ABG    Component Value Date/Time   PHART 7.466* 2015/05/31 0420   PCO2ART 31.5* 2015-05-31 0420   PO2ART 101* May 31, 2015 0420   HCO3 22.4 May 31, 2015 0420   TCO2 23.4 05-31-15 0420   ACIDBASEDEF 0.8 31-May-2015 0420   O2SAT 97.6 May 31, 2015 0420    A/P  1. Dialysis dependent AoCKD5 1. See note from 8/30 2. Plan was  for CRRT as short term attempt for stability 3. Not a candidate to transition to chronic HD 4. Tolerating fluid removal 5. Lytes ok today 6. Cont on current settings  7. Replace Phos again today 8. Anticipate discussion with son, coming from out of town on 9/4 re Goals of Care 9. No evidence of recovered GFR 2. ?HCAP on ABX per PCCM -- Vanc / Aztreonam, / levoflox 3. Shock improved off pressors 4. Diastolic HF 5. VDRF, COPD on chronic oxygen on PSV, ? extubation 6. Frailty  Pearson Grippe, MD [facility] pgr [phone]

## 2015-06-14 NOTE — Progress Notes (Signed)
Spoke to on call Renal Md, Dr Darrick Penna. New orders acknowledged for CRRT and changes made. Family has been at bedside and aware of pts condition.   We are currently waiting on pts son to arrive from the airport. He has been updated by other family members about the change in the pts status today.   Will continue to monitor.

## 2015-06-14 DEATH — deceased

## 2015-06-16 ENCOUNTER — Ambulatory Visit: Payer: Medicare Other | Admitting: Cardiology

## 2016-07-17 IMAGING — US US RENAL
1 series · 14 of 25 positions shown · non-contrast
Comparison: None.

CLINICAL DATA: Acute kidney injury

EXAM:
RENAL / URINARY TRACT ULTRASOUND COMPLETE

[Series 1: us renal · 0.22mm/px · 14 of 40 slices shown]
[im 1/40]
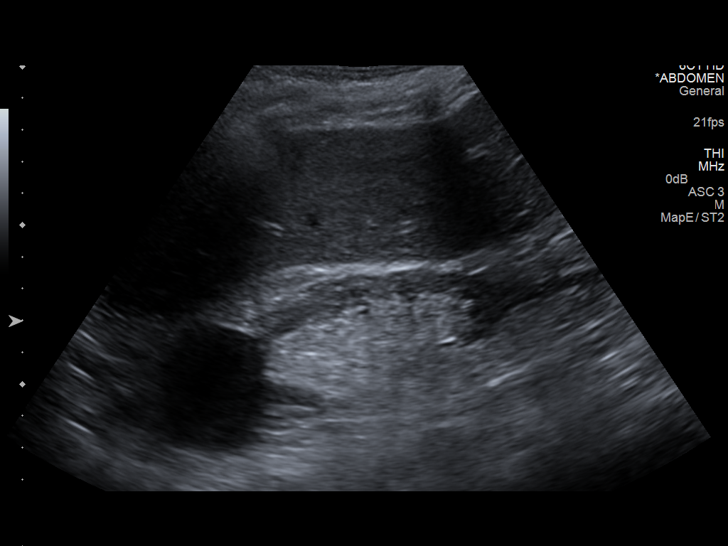
[im 4/40]
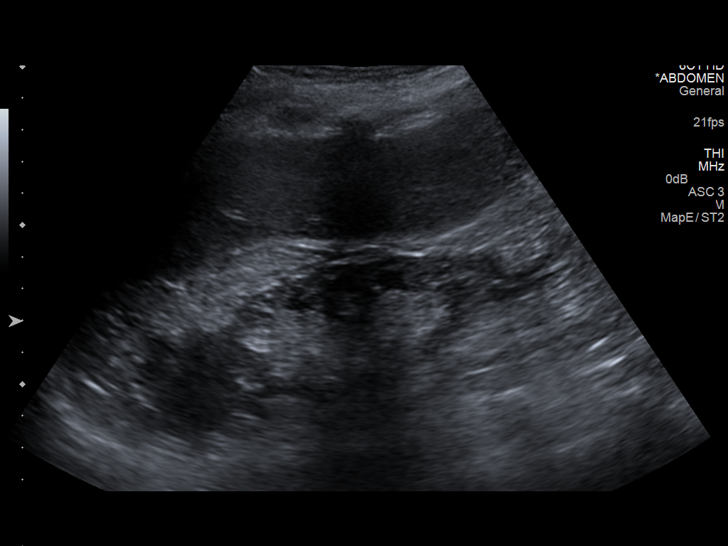
[im 7/40]
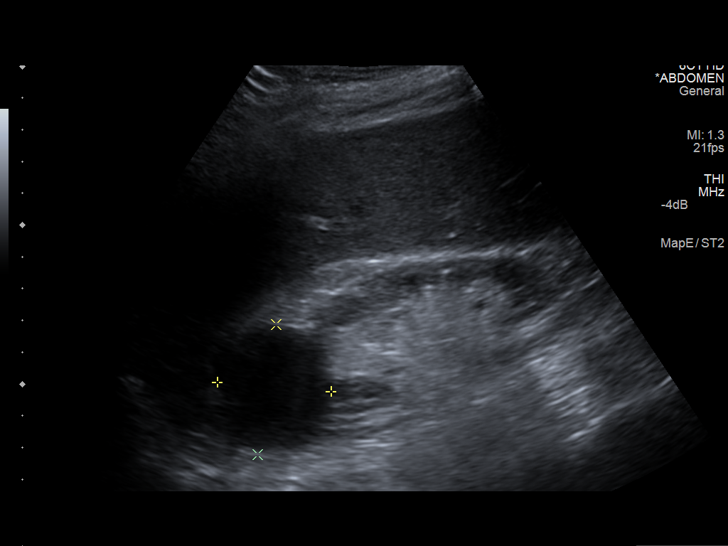
[im 10/40]
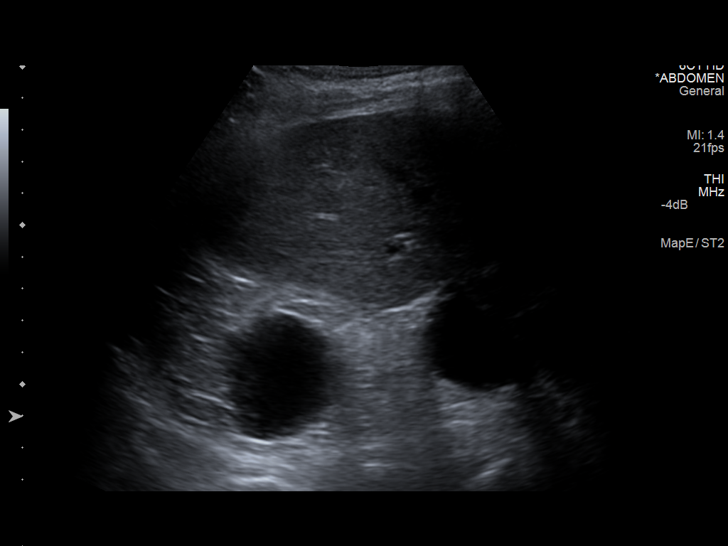
[im 14/40]
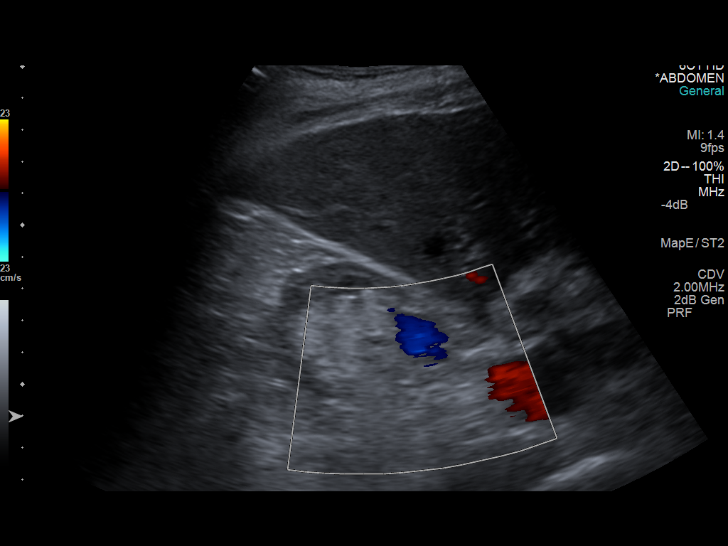
[im 15/40]
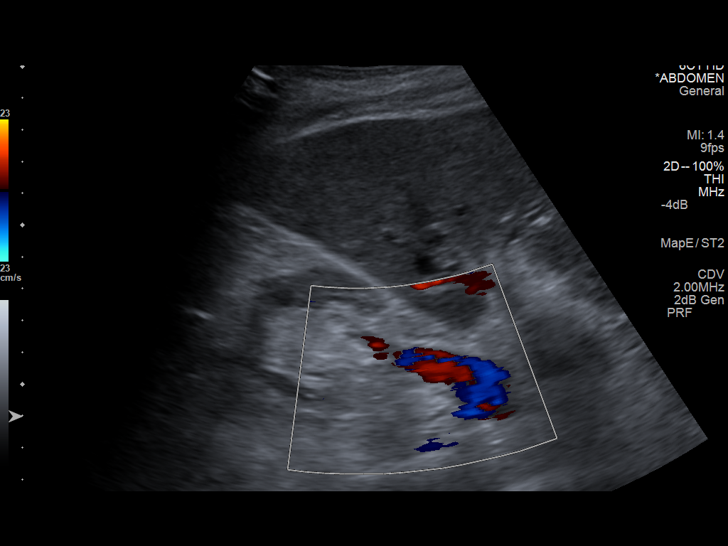
[im 18/40]
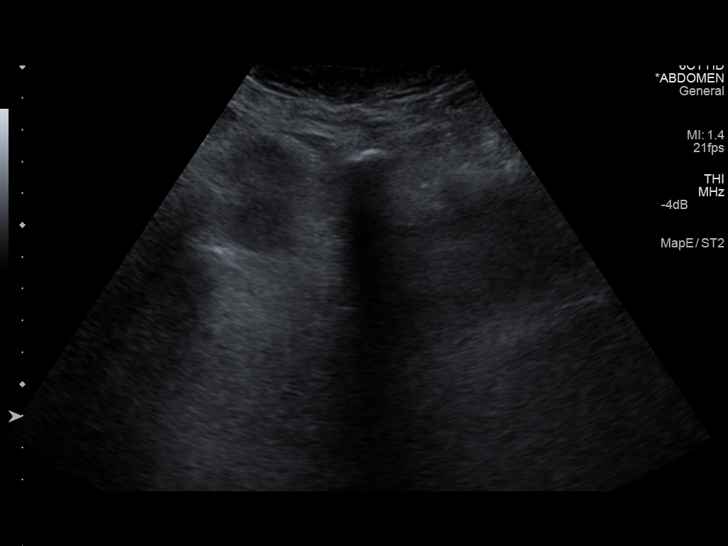
[im 22/40]
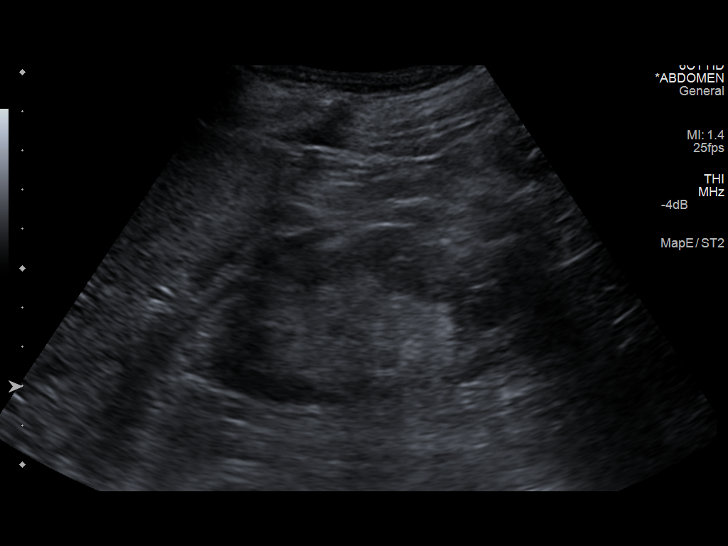
[im 25/40]
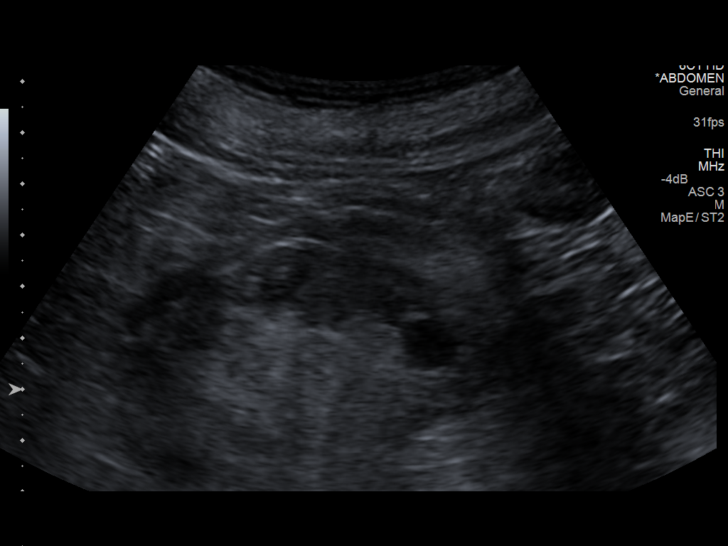
[im 27/40]
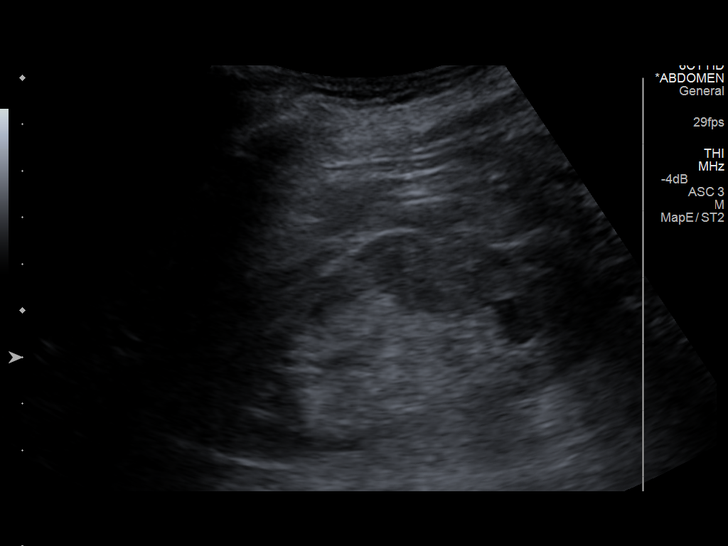
[im 30/40]
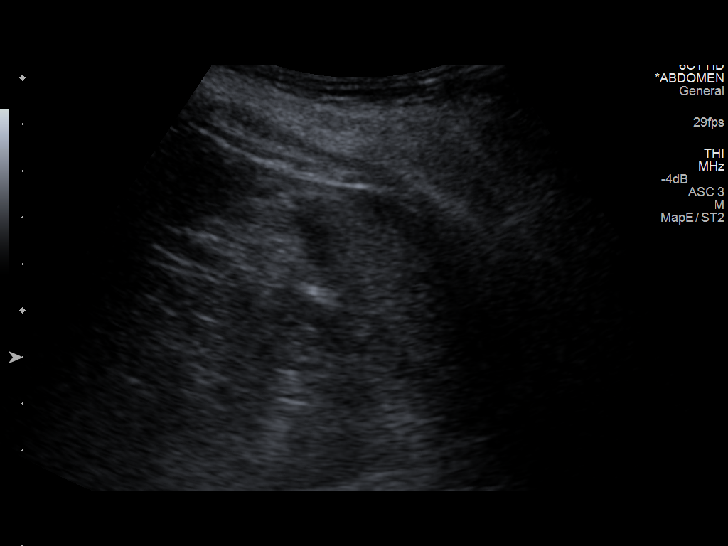
[im 33/40]
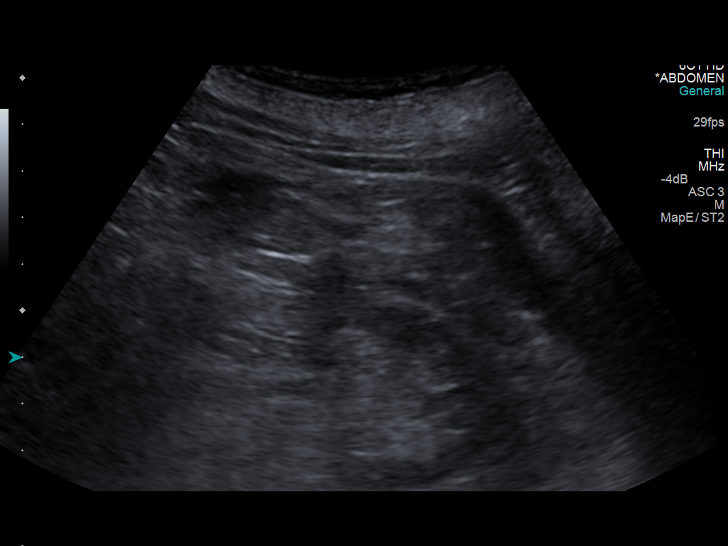
[im 36/40]
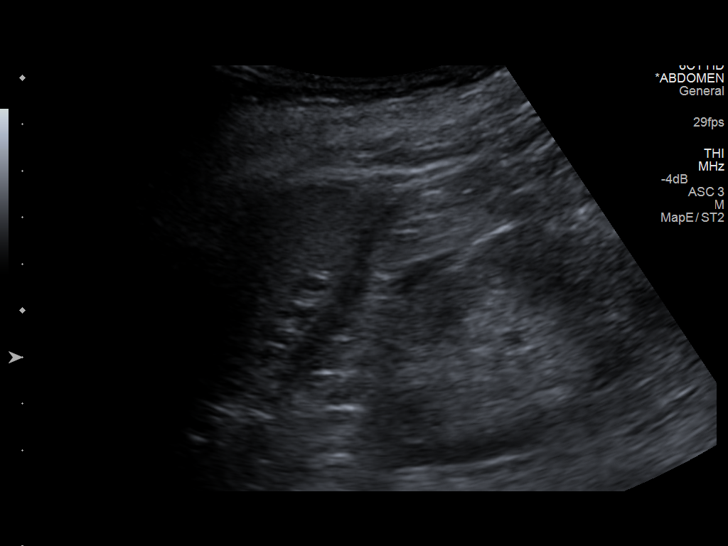
[im 40/40]
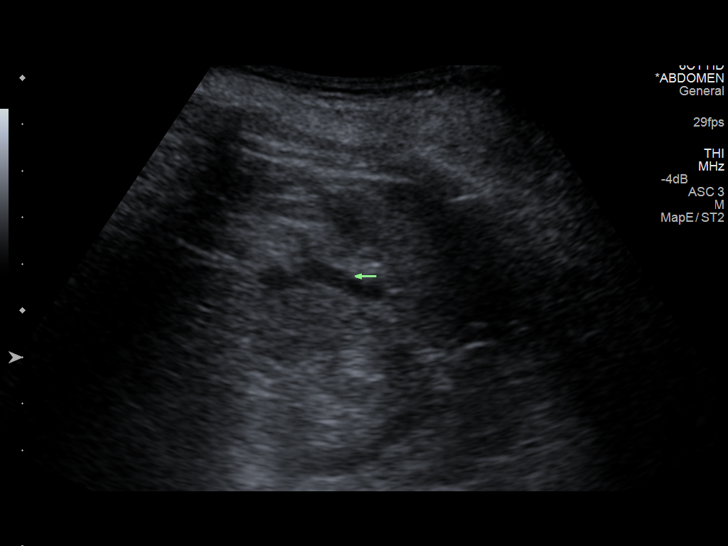

[14 of 25 positions shown; findings below may reference images not displayed]

FINDINGS: Right Kidney:

Length: 9.8 cm. Increased parenchymal echogenicity consistent with
medical renal disease. Benign 3.6 cm upper pole cyst. No
hydronephrosis.

Left Kidney:

Length: 9.2 cm.. Increased parenchymal echogenicity consistent with
medical renal disease. No hydronephrosis. Trace perinephric fluid.

Bladder:

Collapsed around a Foley catheter.
IMPRESSION: Negative for hydronephrosis. Increased parenchymal echogenicity
suggesting medical renal disease.

## 2016-09-26 IMAGING — CT CT HEAD W/O CM
2 series · 17 of 30 positions shown, 20 images · non-contrast
Comparison: 09/14/2013

CLINICAL DATA: Decreased level of consciousness today.

EXAM:
CT HEAD WITHOUT CONTRAST
TECHNIQUE: Contiguous axial images were obtained from the base of the skull
through the vertex without intravenous contrast.

[Series 2: head w/o · axial · non-contrast · 0.40mm/px · z∈[+418,+538]mm · 9 of 30 slices shown, 12 images]
[im 3/30  brain]
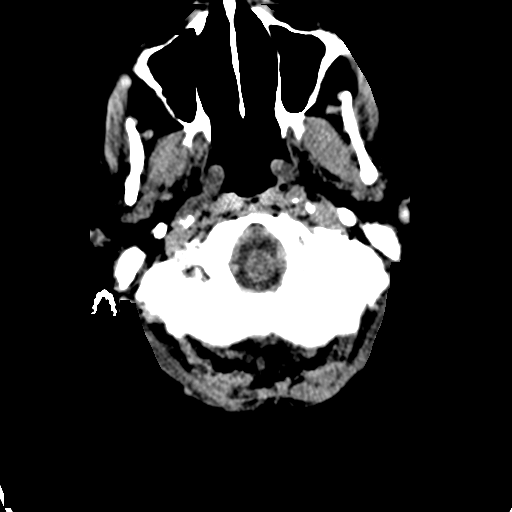
[im 3/30  bone]
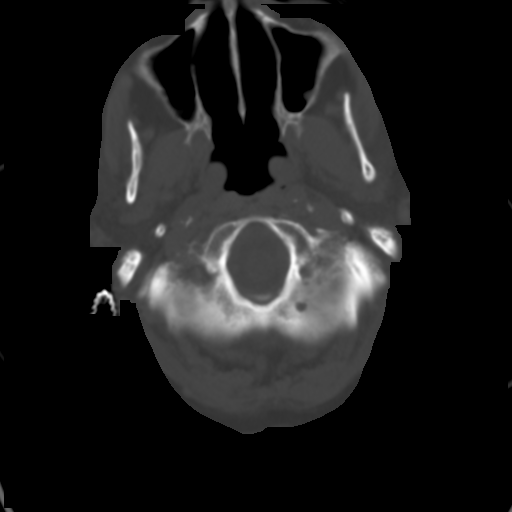
[im 6/30  brain]
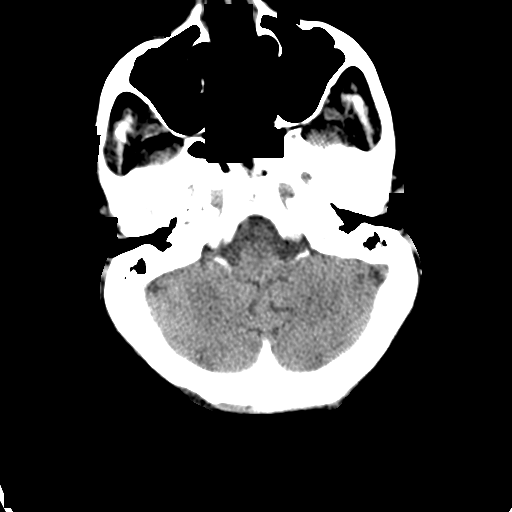
[im 9/30  brain]
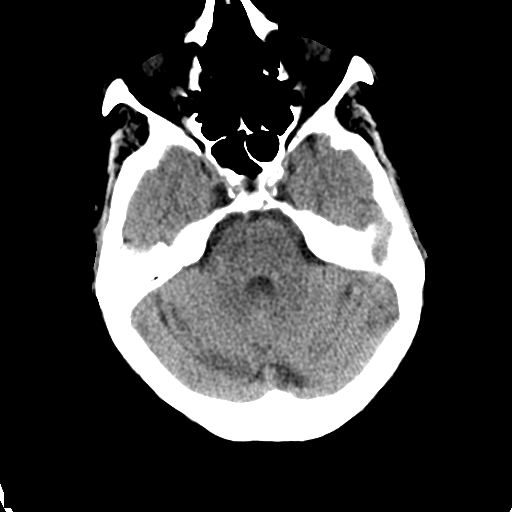
[im 12/30  brain]
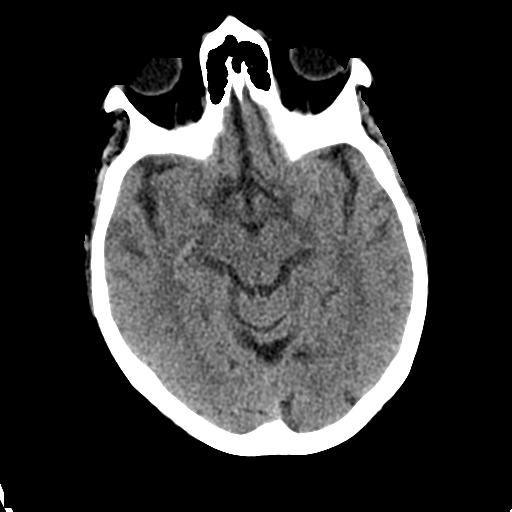
[im 15/30  brain]
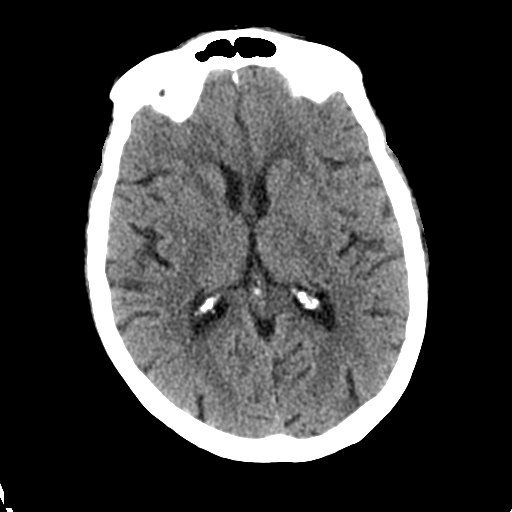
[im 15/30  bone]
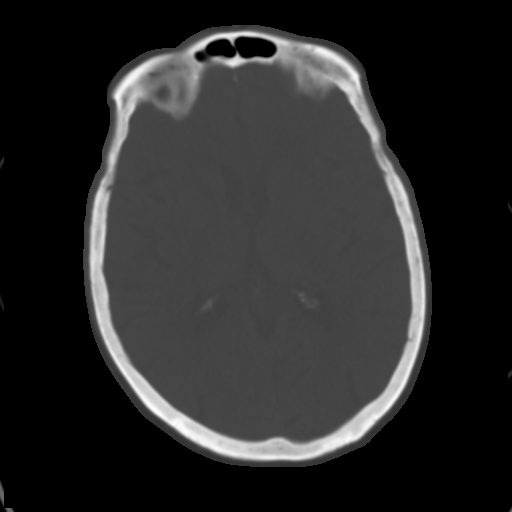
[im 18/30  brain]
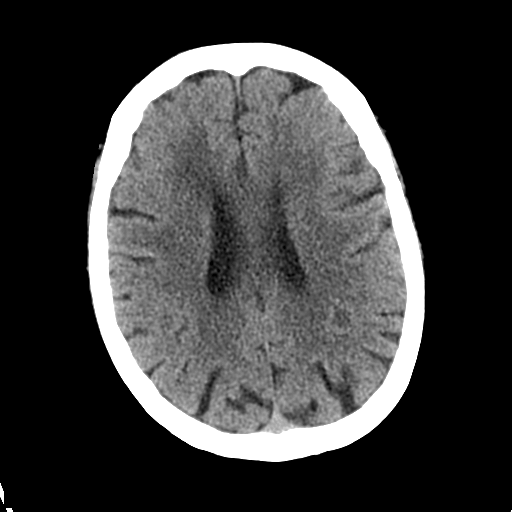
[im 21/30  brain]
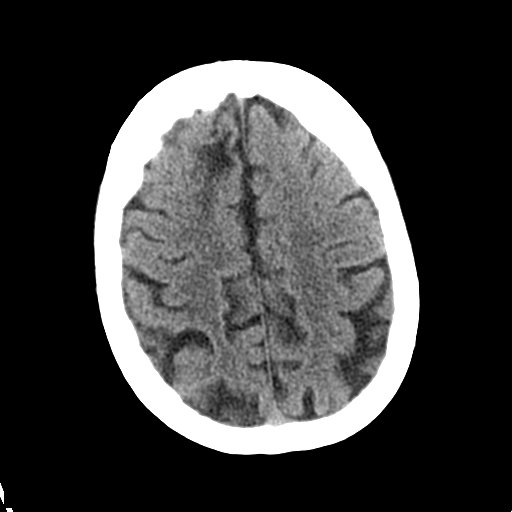
[im 24/30  brain]
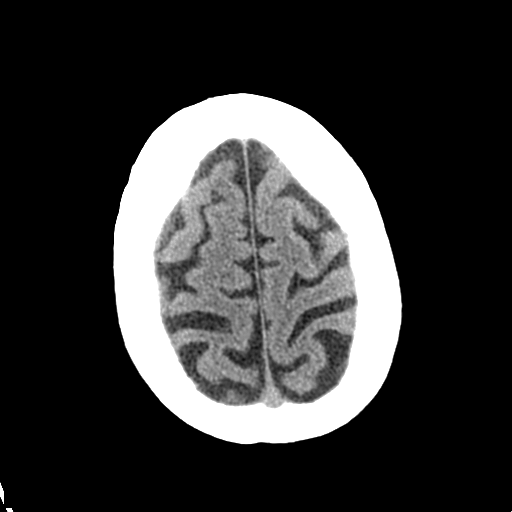
[im 27/30  brain]
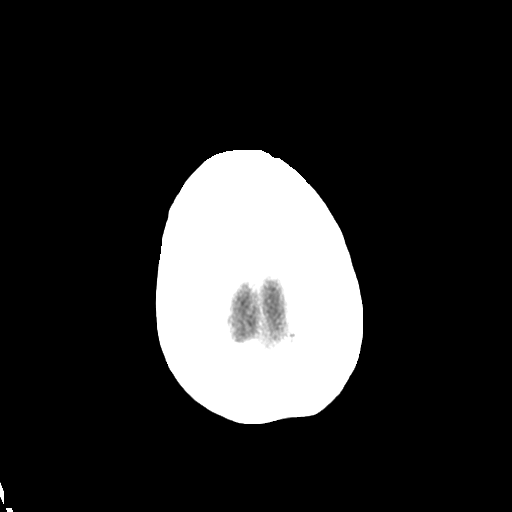
[im 27/30  bone]
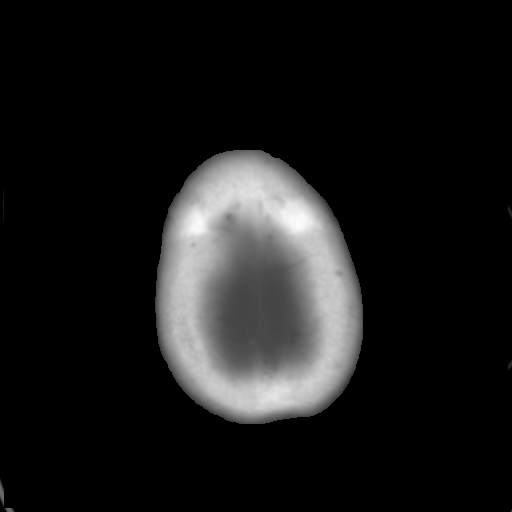

[Series 3: bone windows · axial · 0.40mm/px · z∈[+423,+534]mm · 8 of 49 slices shown]
[im 6/49  bone]
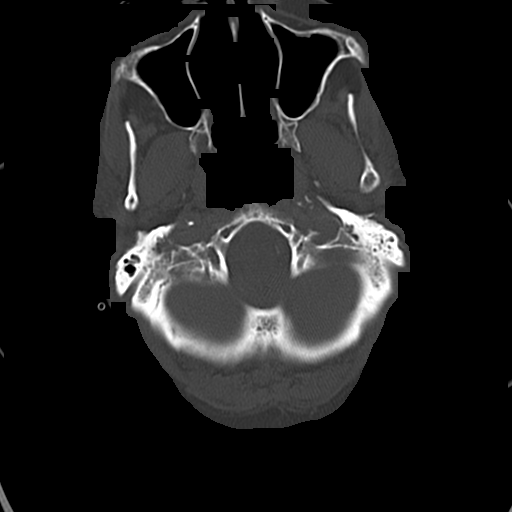
[im 11/49  bone]
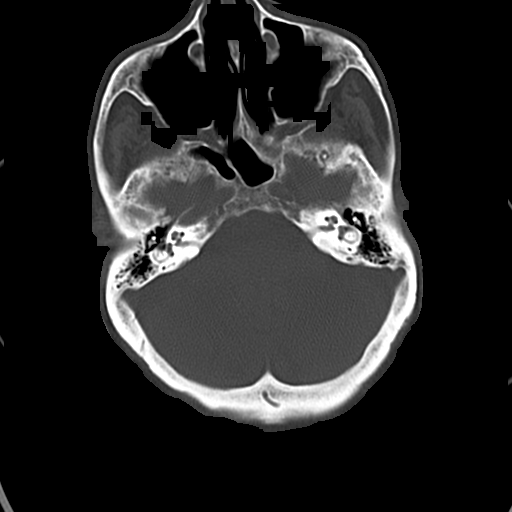
[im 17/49  bone]
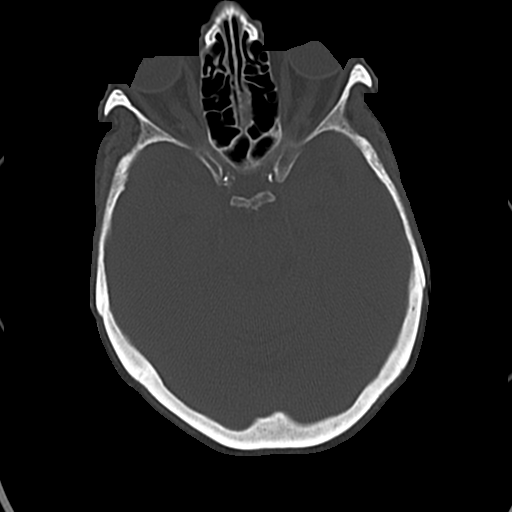
[im 22/49  bone]
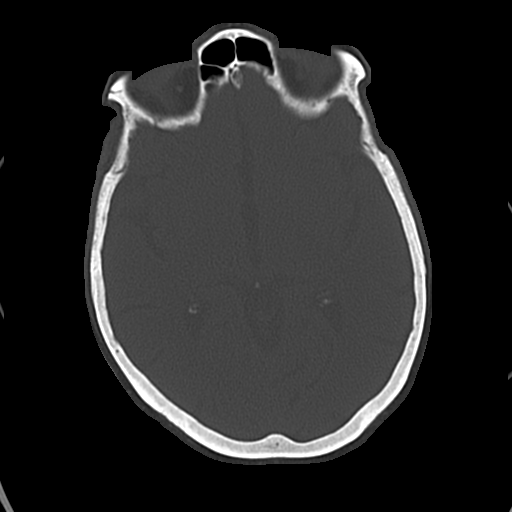
[im 27/49  bone]
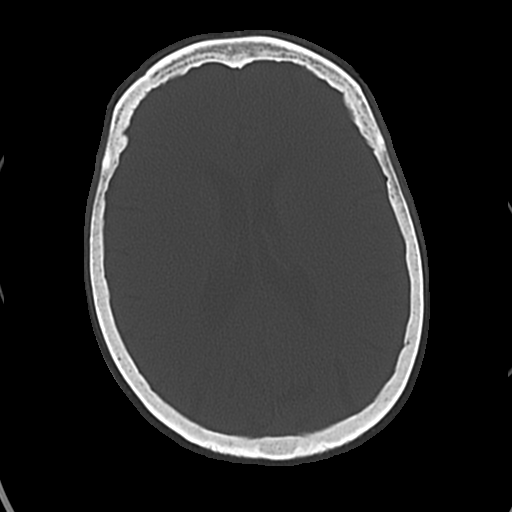
[im 33/49  bone]
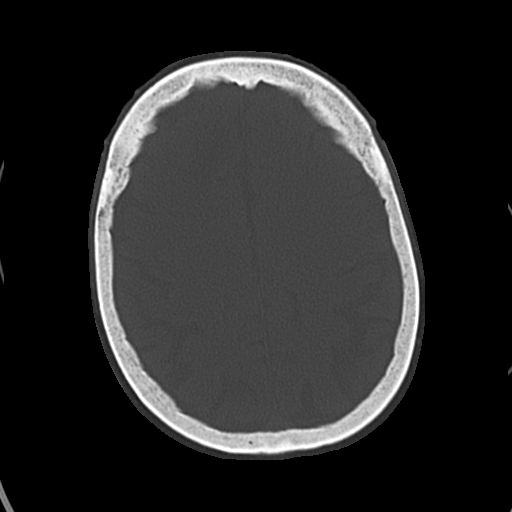
[im 38/49  bone]
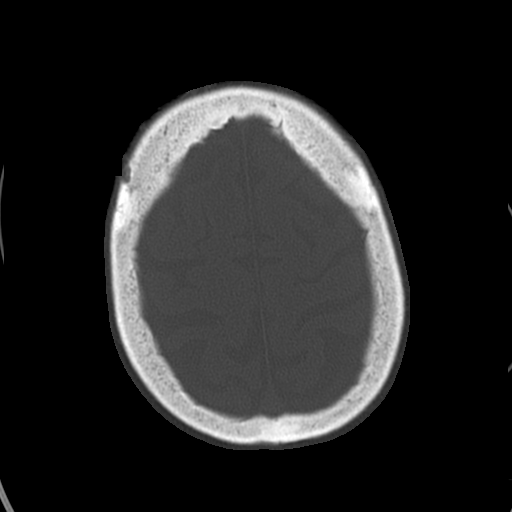
[im 43/49  bone]
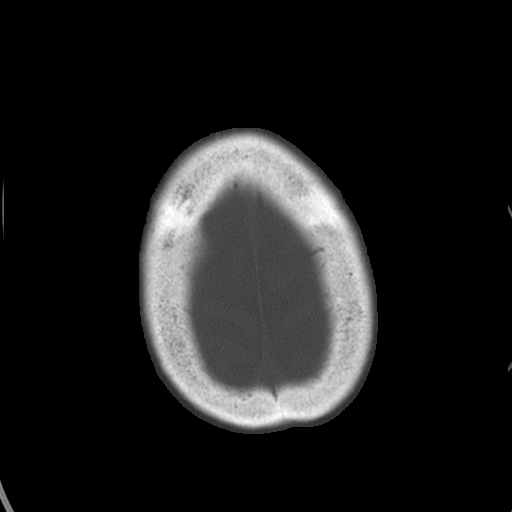

[17 of 30 positions shown; findings below may reference images not displayed]

FINDINGS: Stable age related cerebral atrophy, ventriculomegaly and
periventricular white matter disease. No extra-axial fluid
collections are identified. No CT findings for acute hemispheric
infarction or intracranial hemorrhage. No mass lesions. Probable
remote white matter infarct in the right frontal area. The brainstem
and cerebellum are normal.

No acute bony findings. The paranasal sinuses and mastoid air cells
are clear.
IMPRESSION: Stable age related cerebral atrophy, ventriculomegaly and
periventricular white matter disease. No acute intracranial findings
or mass lesion.

## 2016-09-27 IMAGING — CR DG ABD PORTABLE 1V
1 series · 1 of 1 positions shown · non-contrast
Comparison: September 15, 2013

CLINICAL DATA: Obstructed nasogastric tube

EXAM:
PORTABLE ABDOMEN - 1 VIEW

[AP]
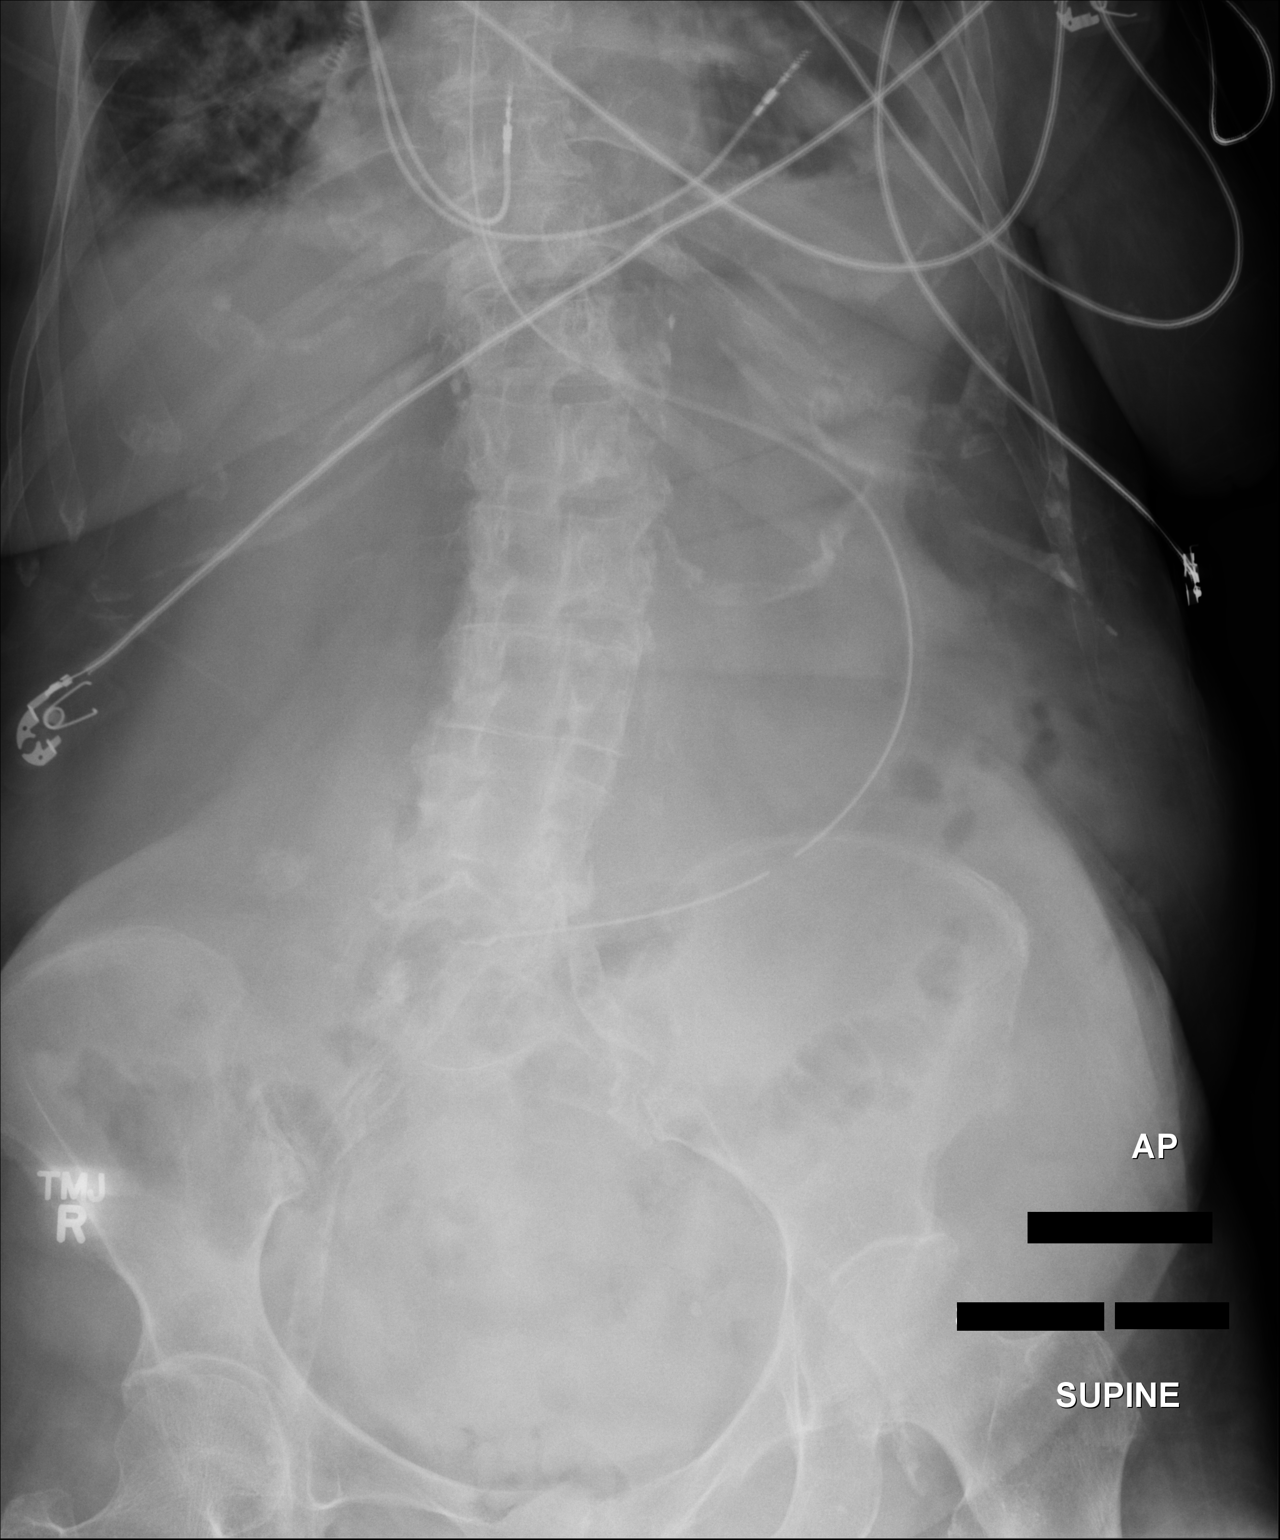

[1 of 1 positions shown; findings below may reference images not displayed]

FINDINGS: Nasogastric tube tip and side port are in the region of the distal
stomach. Bowel gas pattern is unremarkable. No obstruction or free
air apparent. There are multiple foci of arterial vascular
calcification. There is lumbar levoscoliosis, stable.
IMPRESSION: Bowel gas pattern unremarkable. Nasogastric tube tip and side port
in distal stomach.
